# Patient Record
Sex: Female | Born: 1955 | Race: Black or African American | Hispanic: No | Marital: Single | Smoking: Current every day smoker
Health system: Southern US, Community
[De-identification: ages and names within clinical notes are randomized; demographics above are authoritative.]

## PROBLEM LIST (undated history)

## (undated) DIAGNOSIS — R928 Other abnormal and inconclusive findings on diagnostic imaging of breast: Secondary | ICD-10-CM

## (undated) DIAGNOSIS — K219 Gastro-esophageal reflux disease without esophagitis: Secondary | ICD-10-CM

## (undated) DIAGNOSIS — IMO0002 Reserved for concepts with insufficient information to code with codable children: Secondary | ICD-10-CM

## (undated) DIAGNOSIS — I739 Peripheral vascular disease, unspecified: Secondary | ICD-10-CM

## (undated) DIAGNOSIS — E785 Hyperlipidemia, unspecified: Secondary | ICD-10-CM

## (undated) DIAGNOSIS — I2510 Atherosclerotic heart disease of native coronary artery without angina pectoris: Secondary | ICD-10-CM

## (undated) DIAGNOSIS — I10 Essential (primary) hypertension: Secondary | ICD-10-CM

## (undated) DIAGNOSIS — N189 Chronic kidney disease, unspecified: Secondary | ICD-10-CM

## (undated) HISTORY — DX: Reserved for concepts with insufficient information to code with codable children: IMO0002

## (undated) HISTORY — DX: Morbid (severe) obesity due to excess calories: E66.01

## (undated) HISTORY — DX: Hyperlipidemia, unspecified: E78.5

## (undated) HISTORY — DX: Chronic kidney disease, unspecified: N18.9

## (undated) HISTORY — DX: Peripheral vascular disease, unspecified: I73.9

## (undated) HISTORY — PX: TRIGGER FINGER RELEASE: SHX641

## (undated) HISTORY — DX: Atherosclerotic heart disease of native coronary artery without angina pectoris: I25.10

## (undated) HISTORY — DX: Essential (primary) hypertension: I10

## (undated) HISTORY — DX: Gastro-esophageal reflux disease without esophagitis: K21.9

## (undated) HISTORY — DX: Other abnormal and inconclusive findings on diagnostic imaging of breast: R92.8

---

## 1989-03-02 HISTORY — PX: UPPER GASTROINTESTINAL ENDOSCOPY: SHX188

## 1997-12-31 ENCOUNTER — Encounter: Admission: RE | Admit: 1997-12-31 | Discharge: 1997-12-31 | Payer: Self-pay | Admitting: Family Medicine

## 1998-06-17 ENCOUNTER — Encounter: Admission: RE | Admit: 1998-06-17 | Discharge: 1998-06-17 | Payer: Self-pay | Admitting: Sports Medicine

## 1998-06-18 ENCOUNTER — Encounter: Admission: RE | Admit: 1998-06-18 | Discharge: 1998-06-18 | Payer: Self-pay | Admitting: Family Medicine

## 1998-07-01 ENCOUNTER — Encounter: Admission: RE | Admit: 1998-07-01 | Discharge: 1998-07-01 | Payer: Self-pay | Admitting: Family Medicine

## 1998-07-23 ENCOUNTER — Encounter: Admission: RE | Admit: 1998-07-23 | Discharge: 1998-07-23 | Payer: Self-pay | Admitting: Sports Medicine

## 1998-08-21 ENCOUNTER — Other Ambulatory Visit: Admission: RE | Admit: 1998-08-21 | Discharge: 1998-08-21 | Payer: Self-pay | Admitting: *Deleted

## 1998-10-20 ENCOUNTER — Emergency Department (HOSPITAL_COMMUNITY): Admission: EM | Admit: 1998-10-20 | Discharge: 1998-10-20 | Payer: Self-pay | Admitting: Emergency Medicine

## 1998-10-25 ENCOUNTER — Encounter: Admission: RE | Admit: 1998-10-25 | Discharge: 1998-10-25 | Payer: Self-pay | Admitting: Family Medicine

## 1998-11-26 ENCOUNTER — Encounter: Admission: RE | Admit: 1998-11-26 | Discharge: 1998-11-26 | Payer: Self-pay | Admitting: Family Medicine

## 1999-01-08 ENCOUNTER — Encounter: Admission: RE | Admit: 1999-01-08 | Discharge: 1999-01-08 | Payer: Self-pay | Admitting: Family Medicine

## 1999-01-20 ENCOUNTER — Encounter: Admission: RE | Admit: 1999-01-20 | Discharge: 1999-01-20 | Payer: Self-pay | Admitting: Family Medicine

## 1999-03-25 ENCOUNTER — Encounter: Admission: RE | Admit: 1999-03-25 | Discharge: 1999-03-25 | Payer: Self-pay | Admitting: Sports Medicine

## 1999-04-10 ENCOUNTER — Encounter: Admission: RE | Admit: 1999-04-10 | Discharge: 1999-04-10 | Payer: Self-pay | Admitting: Family Medicine

## 1999-04-14 ENCOUNTER — Encounter: Payer: Self-pay | Admitting: Sports Medicine

## 1999-04-14 ENCOUNTER — Encounter: Admission: RE | Admit: 1999-04-14 | Discharge: 1999-04-14 | Payer: Self-pay | Admitting: Family Medicine

## 1999-04-14 ENCOUNTER — Encounter: Admission: RE | Admit: 1999-04-14 | Discharge: 1999-04-14 | Payer: Self-pay | Admitting: Sports Medicine

## 1999-06-10 ENCOUNTER — Other Ambulatory Visit: Admission: RE | Admit: 1999-06-10 | Discharge: 1999-06-10 | Payer: Self-pay | Admitting: Family Medicine

## 1999-06-12 ENCOUNTER — Encounter: Admission: RE | Admit: 1999-06-12 | Discharge: 1999-06-12 | Payer: Self-pay | Admitting: Family Medicine

## 1999-06-18 ENCOUNTER — Encounter: Admission: RE | Admit: 1999-06-18 | Discharge: 1999-06-18 | Payer: Self-pay | Admitting: Family Medicine

## 1999-06-20 ENCOUNTER — Encounter: Admission: RE | Admit: 1999-06-20 | Discharge: 1999-06-20 | Payer: Self-pay | Admitting: Family Medicine

## 1999-10-15 ENCOUNTER — Encounter: Payer: Self-pay | Admitting: Emergency Medicine

## 1999-10-15 ENCOUNTER — Emergency Department (HOSPITAL_COMMUNITY): Admission: EM | Admit: 1999-10-15 | Discharge: 1999-10-15 | Payer: Self-pay | Admitting: Emergency Medicine

## 2001-03-10 ENCOUNTER — Encounter: Admission: RE | Admit: 2001-03-10 | Discharge: 2001-03-10 | Payer: Self-pay | Admitting: Family Medicine

## 2001-03-24 ENCOUNTER — Encounter: Admission: RE | Admit: 2001-03-24 | Discharge: 2001-03-24 | Payer: Self-pay | Admitting: Family Medicine

## 2001-04-04 ENCOUNTER — Emergency Department (HOSPITAL_COMMUNITY): Admission: EM | Admit: 2001-04-04 | Discharge: 2001-04-04 | Payer: Self-pay | Admitting: *Deleted

## 2001-04-04 ENCOUNTER — Encounter: Payer: Self-pay | Admitting: Emergency Medicine

## 2002-12-02 ENCOUNTER — Emergency Department (HOSPITAL_COMMUNITY): Admission: EM | Admit: 2002-12-02 | Discharge: 2002-12-02 | Payer: Self-pay | Admitting: Emergency Medicine

## 2002-12-02 ENCOUNTER — Encounter: Payer: Self-pay | Admitting: Emergency Medicine

## 2003-06-26 ENCOUNTER — Encounter: Admission: RE | Admit: 2003-06-26 | Discharge: 2003-06-26 | Payer: Self-pay | Admitting: Sports Medicine

## 2003-07-05 ENCOUNTER — Encounter (INDEPENDENT_AMBULATORY_CARE_PROVIDER_SITE_OTHER): Payer: Self-pay | Admitting: *Deleted

## 2003-07-05 LAB — CONVERTED CEMR LAB

## 2003-07-11 ENCOUNTER — Encounter: Admission: RE | Admit: 2003-07-11 | Discharge: 2003-07-11 | Payer: Self-pay | Admitting: Family Medicine

## 2003-07-17 ENCOUNTER — Encounter: Admission: RE | Admit: 2003-07-17 | Discharge: 2003-07-17 | Payer: Self-pay | Admitting: Family Medicine

## 2003-07-17 ENCOUNTER — Encounter (INDEPENDENT_AMBULATORY_CARE_PROVIDER_SITE_OTHER): Payer: Self-pay | Admitting: Specialist

## 2003-08-09 ENCOUNTER — Encounter: Admission: RE | Admit: 2003-08-09 | Discharge: 2003-08-09 | Payer: Self-pay | Admitting: Family Medicine

## 2003-09-05 ENCOUNTER — Encounter: Admission: RE | Admit: 2003-09-05 | Discharge: 2003-09-05 | Payer: Self-pay | Admitting: Family Medicine

## 2003-09-07 ENCOUNTER — Encounter: Admission: RE | Admit: 2003-09-07 | Discharge: 2003-09-07 | Payer: Self-pay | Admitting: Family Medicine

## 2003-09-09 ENCOUNTER — Emergency Department (HOSPITAL_COMMUNITY): Admission: EM | Admit: 2003-09-09 | Discharge: 2003-09-09 | Payer: Self-pay | Admitting: Emergency Medicine

## 2004-06-11 ENCOUNTER — Emergency Department (HOSPITAL_COMMUNITY): Admission: EM | Admit: 2004-06-11 | Discharge: 2004-06-11 | Payer: Self-pay | Admitting: Emergency Medicine

## 2005-03-10 ENCOUNTER — Emergency Department (HOSPITAL_COMMUNITY): Admission: EM | Admit: 2005-03-10 | Discharge: 2005-03-10 | Payer: Self-pay | Admitting: Emergency Medicine

## 2006-03-13 ENCOUNTER — Emergency Department (HOSPITAL_COMMUNITY): Admission: EM | Admit: 2006-03-13 | Discharge: 2006-03-13 | Payer: Self-pay | Admitting: Emergency Medicine

## 2006-04-29 DIAGNOSIS — F17200 Nicotine dependence, unspecified, uncomplicated: Secondary | ICD-10-CM

## 2006-04-29 DIAGNOSIS — I10 Essential (primary) hypertension: Secondary | ICD-10-CM | POA: Insufficient documentation

## 2006-04-29 DIAGNOSIS — E669 Obesity, unspecified: Secondary | ICD-10-CM

## 2006-04-30 ENCOUNTER — Encounter (INDEPENDENT_AMBULATORY_CARE_PROVIDER_SITE_OTHER): Payer: Self-pay | Admitting: *Deleted

## 2006-12-24 ENCOUNTER — Emergency Department (HOSPITAL_COMMUNITY): Admission: EM | Admit: 2006-12-24 | Discharge: 2006-12-24 | Payer: Self-pay | Admitting: Emergency Medicine

## 2006-12-27 ENCOUNTER — Emergency Department (HOSPITAL_COMMUNITY): Admission: EM | Admit: 2006-12-27 | Discharge: 2006-12-27 | Payer: Self-pay | Admitting: Emergency Medicine

## 2006-12-27 ENCOUNTER — Ambulatory Visit: Payer: Self-pay | Admitting: *Deleted

## 2007-01-05 ENCOUNTER — Encounter: Payer: Self-pay | Admitting: Family Medicine

## 2007-01-05 ENCOUNTER — Ambulatory Visit: Payer: Self-pay | Admitting: Family Medicine

## 2007-01-05 LAB — CONVERTED CEMR LAB
ALT: 12 units/L (ref 0–35)
AST: 17 units/L (ref 0–37)
Albumin: 4.1 g/dL (ref 3.5–5.2)
Alkaline Phosphatase: 77 units/L (ref 39–117)
BUN: 13 mg/dL (ref 6–23)
CO2: 24 meq/L (ref 19–32)
Calcium: 9.3 mg/dL (ref 8.4–10.5)
Chloride: 107 meq/L (ref 96–112)
Creatinine, Ser: 0.86 mg/dL (ref 0.40–1.20)
Glucose, Bld: 92 mg/dL (ref 70–99)
Potassium: 3.7 meq/L (ref 3.5–5.3)
Sodium: 141 meq/L (ref 135–145)
Total Bilirubin: 0.6 mg/dL (ref 0.3–1.2)
Total Protein: 7.5 g/dL (ref 6.0–8.3)

## 2007-01-12 ENCOUNTER — Encounter: Payer: Self-pay | Admitting: Family Medicine

## 2007-01-12 ENCOUNTER — Ambulatory Visit: Payer: Self-pay | Admitting: Family Medicine

## 2007-01-12 LAB — CONVERTED CEMR LAB
BUN: 21 mg/dL (ref 6–23)
Chloride: 104 meq/L (ref 96–112)
Creatinine, Ser: 0.86 mg/dL (ref 0.40–1.20)
Glucose, Bld: 96 mg/dL (ref 70–99)
Potassium: 4 meq/L (ref 3.5–5.3)
Sodium: 139 meq/L (ref 135–145)

## 2007-02-28 ENCOUNTER — Ambulatory Visit: Payer: Self-pay | Admitting: Family Medicine

## 2007-03-08 ENCOUNTER — Telehealth: Payer: Self-pay | Admitting: *Deleted

## 2007-04-08 ENCOUNTER — Ambulatory Visit: Payer: Self-pay | Admitting: Family Medicine

## 2007-04-26 ENCOUNTER — Ambulatory Visit: Payer: Self-pay | Admitting: Family Medicine

## 2007-05-06 ENCOUNTER — Ambulatory Visit: Payer: Self-pay | Admitting: Family Medicine

## 2007-05-17 ENCOUNTER — Ambulatory Visit: Payer: Self-pay | Admitting: Family Medicine

## 2007-05-18 ENCOUNTER — Telehealth: Payer: Self-pay | Admitting: *Deleted

## 2007-06-02 ENCOUNTER — Ambulatory Visit: Payer: Self-pay | Admitting: Family Medicine

## 2007-06-07 ENCOUNTER — Encounter (INDEPENDENT_AMBULATORY_CARE_PROVIDER_SITE_OTHER): Payer: Self-pay | Admitting: *Deleted

## 2007-06-07 ENCOUNTER — Ambulatory Visit: Payer: Self-pay | Admitting: Family Medicine

## 2007-06-07 ENCOUNTER — Inpatient Hospital Stay (HOSPITAL_COMMUNITY): Admission: EM | Admit: 2007-06-07 | Discharge: 2007-06-09 | Payer: Self-pay | Admitting: Emergency Medicine

## 2007-06-07 DIAGNOSIS — D649 Anemia, unspecified: Secondary | ICD-10-CM | POA: Insufficient documentation

## 2007-06-20 ENCOUNTER — Encounter: Payer: Self-pay | Admitting: Family Medicine

## 2007-06-20 ENCOUNTER — Ambulatory Visit: Payer: Self-pay | Admitting: Family Medicine

## 2007-06-20 LAB — CONVERTED CEMR LAB
BUN: 24 mg/dL — ABNORMAL HIGH (ref 6–23)
CO2: 21 meq/L (ref 19–32)
Calcium: 9 mg/dL (ref 8.4–10.5)
Chloride: 106 meq/L (ref 96–112)
Creatinine, Ser: 1.31 mg/dL — ABNORMAL HIGH (ref 0.40–1.20)
Glucose, Bld: 95 mg/dL (ref 70–99)
HCT: 28.5 % — ABNORMAL LOW (ref 36.0–46.0)
Hemoglobin: 9.3 g/dL — ABNORMAL LOW (ref 12.0–15.0)
MCHC: 32.6 g/dL (ref 30.0–36.0)
MCV: 84.6 fL (ref 78.0–100.0)
Platelets: 219 10*3/uL (ref 150–400)
Potassium: 4.3 meq/L (ref 3.5–5.3)
RBC: 3.37 M/uL — ABNORMAL LOW (ref 3.87–5.11)
RDW: 13.4 % (ref 11.5–15.5)
Sodium: 138 meq/L (ref 135–145)
WBC: 6.2 10*3/uL (ref 4.0–10.5)

## 2007-06-21 ENCOUNTER — Telehealth: Payer: Self-pay | Admitting: Family Medicine

## 2007-07-13 ENCOUNTER — Ambulatory Visit: Payer: Self-pay | Admitting: Family Medicine

## 2007-07-13 ENCOUNTER — Encounter: Payer: Self-pay | Admitting: Family Medicine

## 2007-07-13 LAB — CONVERTED CEMR LAB
BUN: 14 mg/dL (ref 6–23)
CO2: 22 meq/L (ref 19–32)
Calcium: 9.4 mg/dL (ref 8.4–10.5)
Chloride: 107 meq/L (ref 96–112)
Creatinine, Ser: 1.09 mg/dL (ref 0.40–1.20)
Glucose, Bld: 111 mg/dL — ABNORMAL HIGH (ref 70–99)
Potassium: 3.9 meq/L (ref 3.5–5.3)
Sodium: 141 meq/L (ref 135–145)

## 2007-07-20 ENCOUNTER — Telehealth: Payer: Self-pay | Admitting: Family Medicine

## 2007-08-10 ENCOUNTER — Ambulatory Visit: Payer: Self-pay | Admitting: Family Medicine

## 2007-08-10 ENCOUNTER — Encounter: Payer: Self-pay | Admitting: Family Medicine

## 2007-08-10 LAB — CONVERTED CEMR LAB
BUN: 21 mg/dL (ref 6–23)
CO2: 18 meq/L — ABNORMAL LOW (ref 19–32)
Calcium: 9.5 mg/dL (ref 8.4–10.5)
Chloride: 106 meq/L (ref 96–112)
Creatinine, Ser: 1.13 mg/dL (ref 0.40–1.20)
Glucose, Bld: 100 mg/dL — ABNORMAL HIGH (ref 70–99)
Potassium: 4.3 meq/L (ref 3.5–5.3)
Sodium: 139 meq/L (ref 135–145)

## 2007-08-15 ENCOUNTER — Telehealth: Payer: Self-pay | Admitting: *Deleted

## 2007-09-19 ENCOUNTER — Ambulatory Visit: Payer: Self-pay | Admitting: Family Medicine

## 2007-09-20 ENCOUNTER — Telehealth: Payer: Self-pay | Admitting: *Deleted

## 2007-10-07 ENCOUNTER — Encounter (INDEPENDENT_AMBULATORY_CARE_PROVIDER_SITE_OTHER): Payer: Self-pay | Admitting: *Deleted

## 2007-10-14 ENCOUNTER — Ambulatory Visit: Payer: Self-pay | Admitting: Family Medicine

## 2007-10-26 ENCOUNTER — Telehealth (INDEPENDENT_AMBULATORY_CARE_PROVIDER_SITE_OTHER): Payer: Self-pay | Admitting: *Deleted

## 2007-11-03 ENCOUNTER — Telehealth (INDEPENDENT_AMBULATORY_CARE_PROVIDER_SITE_OTHER): Payer: Self-pay | Admitting: *Deleted

## 2007-11-04 ENCOUNTER — Encounter: Payer: Self-pay | Admitting: Family Medicine

## 2007-11-08 ENCOUNTER — Ambulatory Visit: Payer: Self-pay | Admitting: Family Medicine

## 2007-12-22 ENCOUNTER — Ambulatory Visit: Payer: Self-pay | Admitting: Family Medicine

## 2008-01-02 ENCOUNTER — Ambulatory Visit: Payer: Self-pay | Admitting: Family Medicine

## 2008-01-02 DIAGNOSIS — Z872 Personal history of diseases of the skin and subcutaneous tissue: Secondary | ICD-10-CM | POA: Insufficient documentation

## 2008-03-26 ENCOUNTER — Ambulatory Visit (HOSPITAL_COMMUNITY): Admission: RE | Admit: 2008-03-26 | Discharge: 2008-03-26 | Payer: Self-pay | Admitting: Family Medicine

## 2008-03-26 ENCOUNTER — Encounter (INDEPENDENT_AMBULATORY_CARE_PROVIDER_SITE_OTHER): Payer: Self-pay | Admitting: Family Medicine

## 2008-03-26 ENCOUNTER — Ambulatory Visit: Payer: Self-pay | Admitting: Family Medicine

## 2008-04-27 ENCOUNTER — Telehealth: Payer: Self-pay | Admitting: Family Medicine

## 2008-05-07 ENCOUNTER — Ambulatory Visit: Payer: Self-pay | Admitting: Family Medicine

## 2008-05-07 ENCOUNTER — Encounter: Payer: Self-pay | Admitting: Family Medicine

## 2008-05-07 LAB — CONVERTED CEMR LAB
BUN: 17 mg/dL (ref 6–23)
CO2: 19 meq/L (ref 19–32)
Calcium: 8.6 mg/dL (ref 8.4–10.5)
Chloride: 108 meq/L (ref 96–112)
Creatinine, Ser: 0.93 mg/dL (ref 0.40–1.20)
Glucose, Bld: 130 mg/dL — ABNORMAL HIGH (ref 70–99)
Potassium: 3.8 meq/L (ref 3.5–5.3)
Sodium: 141 meq/L (ref 135–145)

## 2008-05-17 ENCOUNTER — Encounter: Payer: Self-pay | Admitting: Family Medicine

## 2008-06-04 ENCOUNTER — Ambulatory Visit: Payer: Self-pay | Admitting: Family Medicine

## 2008-08-28 ENCOUNTER — Ambulatory Visit: Payer: Self-pay | Admitting: Family Medicine

## 2008-11-01 ENCOUNTER — Encounter: Payer: Self-pay | Admitting: Family Medicine

## 2008-11-01 ENCOUNTER — Ambulatory Visit: Payer: Self-pay | Admitting: Family Medicine

## 2008-11-02 ENCOUNTER — Encounter: Payer: Self-pay | Admitting: Family Medicine

## 2008-11-02 ENCOUNTER — Ambulatory Visit: Payer: Self-pay | Admitting: Family Medicine

## 2008-11-02 LAB — CONVERTED CEMR LAB
ALT: 9 units/L (ref 0–35)
AST: 14 units/L (ref 0–37)
Albumin: 4.3 g/dL (ref 3.5–5.2)
Alkaline Phosphatase: 86 units/L (ref 39–117)
BUN: 18 mg/dL (ref 6–23)
CO2: 21 meq/L (ref 19–32)
Calcium: 9.4 mg/dL (ref 8.4–10.5)
Chloride: 106 meq/L (ref 96–112)
Cholesterol: 208 mg/dL — ABNORMAL HIGH (ref 0–200)
Creatinine, Ser: 0.99 mg/dL (ref 0.40–1.20)
Glucose, Bld: 84 mg/dL (ref 70–99)
HDL: 55 mg/dL (ref >39)
LDL Cholesterol: 135 mg/dL — ABNORMAL HIGH (ref 0–99)
Potassium: 4.3 meq/L (ref 3.5–5.3)
Sodium: 141 meq/L (ref 135–145)
Total Bilirubin: 0.5 mg/dL (ref 0.3–1.2)
Total CHOL/HDL Ratio: 3.8
Total Protein: 7.8 g/dL (ref 6.0–8.3)
Triglycerides: 89 mg/dL (ref <150)
VLDL: 18 mg/dL (ref 0–40)

## 2008-11-08 ENCOUNTER — Encounter: Payer: Self-pay | Admitting: Family Medicine

## 2008-11-26 ENCOUNTER — Telehealth: Payer: Self-pay | Admitting: *Deleted

## 2008-12-10 ENCOUNTER — Encounter: Payer: Self-pay | Admitting: Family Medicine

## 2009-02-05 ENCOUNTER — Ambulatory Visit: Payer: Self-pay | Admitting: Family Medicine

## 2009-02-07 ENCOUNTER — Ambulatory Visit: Payer: Self-pay | Admitting: Family Medicine

## 2009-02-11 ENCOUNTER — Telehealth: Payer: Self-pay | Admitting: *Deleted

## 2009-04-01 ENCOUNTER — Ambulatory Visit: Payer: Self-pay | Admitting: Family Medicine

## 2009-04-03 ENCOUNTER — Telehealth: Payer: Self-pay | Admitting: Family Medicine

## 2009-06-03 ENCOUNTER — Encounter: Payer: Self-pay | Admitting: Family Medicine

## 2009-06-20 ENCOUNTER — Ambulatory Visit: Payer: Self-pay | Admitting: Family Medicine

## 2009-08-06 ENCOUNTER — Ambulatory Visit: Payer: Self-pay | Admitting: Family Medicine

## 2009-08-20 ENCOUNTER — Ambulatory Visit: Payer: Self-pay | Admitting: Family Medicine

## 2009-08-20 DIAGNOSIS — M6740 Ganglion, unspecified site: Secondary | ICD-10-CM | POA: Insufficient documentation

## 2009-08-21 ENCOUNTER — Ambulatory Visit: Payer: Self-pay | Admitting: Sports Medicine

## 2009-08-21 DIAGNOSIS — R229 Localized swelling, mass and lump, unspecified: Secondary | ICD-10-CM

## 2009-09-03 ENCOUNTER — Encounter: Payer: Self-pay | Admitting: Family Medicine

## 2009-12-02 ENCOUNTER — Encounter: Payer: Self-pay | Admitting: Family Medicine

## 2009-12-02 ENCOUNTER — Ambulatory Visit: Payer: Self-pay | Admitting: Family Medicine

## 2009-12-02 LAB — CONVERTED CEMR LAB
BUN: 15 mg/dL (ref 6–23)
CO2: 22 meq/L (ref 19–32)
Calcium: 9.2 mg/dL (ref 8.4–10.5)
Chloride: 109 meq/L (ref 96–112)
Cholesterol: 236 mg/dL — ABNORMAL HIGH (ref 0–200)
Creatinine, Ser: 0.95 mg/dL (ref 0.40–1.20)
Glucose, Bld: 106 mg/dL — ABNORMAL HIGH (ref 70–99)
HDL: 54 mg/dL (ref >39)
LDL Cholesterol: 159 mg/dL — ABNORMAL HIGH (ref 0–99)
Potassium: 4.3 meq/L (ref 3.5–5.3)
Sodium: 138 meq/L (ref 135–145)
Total CHOL/HDL Ratio: 4.4
Triglycerides: 113 mg/dL (ref <150)
VLDL: 23 mg/dL (ref 0–40)

## 2009-12-03 ENCOUNTER — Telehealth: Payer: Self-pay | Admitting: *Deleted

## 2009-12-16 ENCOUNTER — Ambulatory Visit (HOSPITAL_COMMUNITY): Admission: RE | Admit: 2009-12-16 | Discharge: 2009-12-16 | Payer: Self-pay | Admitting: Family Medicine

## 2009-12-23 ENCOUNTER — Ambulatory Visit: Payer: Self-pay | Admitting: Family Medicine

## 2009-12-23 DIAGNOSIS — E785 Hyperlipidemia, unspecified: Secondary | ICD-10-CM

## 2009-12-24 ENCOUNTER — Encounter: Admission: RE | Admit: 2009-12-24 | Discharge: 2009-12-24 | Payer: Self-pay | Admitting: Family Medicine

## 2009-12-24 DIAGNOSIS — R928 Other abnormal and inconclusive findings on diagnostic imaging of breast: Secondary | ICD-10-CM

## 2009-12-24 HISTORY — DX: Other abnormal and inconclusive findings on diagnostic imaging of breast: R92.8

## 2009-12-26 ENCOUNTER — Observation Stay (HOSPITAL_COMMUNITY)
Admission: EM | Admit: 2009-12-26 | Discharge: 2009-12-27 | Payer: Self-pay | Source: Home / Self Care | Admitting: Emergency Medicine

## 2009-12-27 ENCOUNTER — Ambulatory Visit: Payer: Self-pay | Admitting: Family Medicine

## 2009-12-27 ENCOUNTER — Telehealth: Payer: Self-pay | Admitting: Family Medicine

## 2009-12-27 ENCOUNTER — Encounter: Payer: Self-pay | Admitting: Family Medicine

## 2009-12-27 LAB — CONVERTED CEMR LAB
BUN: 19 mg/dL
CO2: 20 meq/L
Chloride: 107 meq/L
Cholesterol: 225 mg/dL
Creatinine, Ser: 1.05 mg/dL
Glucose, Bld: 169 mg/dL
HDL: 47 mg/dL
Hgb A1c MFr Bld: 5.2 %
LDL Cholesterol: 164 mg/dL
Potassium: 3.7 meq/L
Sodium: 138 meq/L
Triglycerides: 70 mg/dL

## 2009-12-31 ENCOUNTER — Ambulatory Visit: Payer: Self-pay | Admitting: Family Medicine

## 2010-01-04 ENCOUNTER — Telehealth: Payer: Self-pay | Admitting: Family Medicine

## 2010-01-22 ENCOUNTER — Ambulatory Visit: Payer: Self-pay | Admitting: Family Medicine

## 2010-01-31 ENCOUNTER — Ambulatory Visit: Payer: Self-pay | Admitting: Family Medicine

## 2010-01-31 DIAGNOSIS — N644 Mastodynia: Secondary | ICD-10-CM | POA: Insufficient documentation

## 2010-02-03 ENCOUNTER — Ambulatory Visit: Payer: Self-pay | Admitting: Family Medicine

## 2010-02-03 DIAGNOSIS — H5710 Ocular pain, unspecified eye: Secondary | ICD-10-CM

## 2010-02-05 ENCOUNTER — Ambulatory Visit: Payer: Self-pay | Admitting: Family Medicine

## 2010-02-11 ENCOUNTER — Ambulatory Visit: Payer: Self-pay | Admitting: Family Medicine

## 2010-02-19 ENCOUNTER — Ambulatory Visit: Payer: Self-pay | Admitting: Family Medicine

## 2010-02-19 ENCOUNTER — Encounter: Payer: Self-pay | Admitting: Family Medicine

## 2010-02-19 DIAGNOSIS — L089 Local infection of the skin and subcutaneous tissue, unspecified: Secondary | ICD-10-CM | POA: Insufficient documentation

## 2010-02-19 DIAGNOSIS — L680 Hirsutism: Secondary | ICD-10-CM | POA: Insufficient documentation

## 2010-02-19 LAB — CONVERTED CEMR LAB
HCT: 34.8 % — ABNORMAL LOW (ref 36.0–46.0)
Hemoglobin: 11.2 g/dL — ABNORMAL LOW (ref 12.0–15.0)
MCHC: 32.2 g/dL (ref 30.0–36.0)
MCV: 84.3 fL (ref 78.0–100.0)
Platelets: 229 10*3/uL (ref 150–400)
RBC: 4.13 M/uL (ref 3.87–5.11)
RDW: 13.8 % (ref 11.5–15.5)
TSH: 2.67 microintl units/mL (ref 0.350–4.500)
Testosterone: 48.64 ng/dL (ref 10–70)
WBC: 5.3 10*3/uL (ref 4.0–10.5)

## 2010-03-21 ENCOUNTER — Ambulatory Visit: Admission: RE | Admit: 2010-03-21 | Discharge: 2010-03-21 | Payer: Self-pay | Source: Home / Self Care

## 2010-03-21 ENCOUNTER — Encounter: Payer: Self-pay | Admitting: Family Medicine

## 2010-03-22 LAB — CONVERTED CEMR LAB
BUN: 18 mg/dL (ref 6–23)
CO2: 26 meq/L (ref 19–32)
Calcium: 9.6 mg/dL (ref 8.4–10.5)
Chloride: 106 meq/L (ref 96–112)
Creatinine, Ser: 1.09 mg/dL (ref 0.40–1.20)
Glucose, Bld: 109 mg/dL — ABNORMAL HIGH (ref 70–99)
Potassium: 4.7 meq/L (ref 3.5–5.3)
Sodium: 140 meq/L (ref 135–145)

## 2010-03-23 ENCOUNTER — Encounter: Payer: Self-pay | Admitting: Sports Medicine

## 2010-03-24 ENCOUNTER — Encounter: Payer: Self-pay | Admitting: Family Medicine

## 2010-03-25 ENCOUNTER — Ambulatory Visit: Admit: 2010-03-25 | Payer: Self-pay

## 2010-03-25 ENCOUNTER — Ambulatory Visit
Admission: RE | Admit: 2010-03-25 | Discharge: 2010-03-25 | Payer: Self-pay | Source: Home / Self Care | Attending: Family Medicine | Admitting: Family Medicine

## 2010-03-27 ENCOUNTER — Ambulatory Visit: Admit: 2010-03-27 | Payer: Self-pay

## 2010-04-01 ENCOUNTER — Ambulatory Visit
Admission: RE | Admit: 2010-04-01 | Discharge: 2010-04-01 | Payer: Self-pay | Source: Home / Self Care | Attending: Sports Medicine | Admitting: Sports Medicine

## 2010-04-01 DIAGNOSIS — M6530 Trigger finger, unspecified finger: Secondary | ICD-10-CM | POA: Insufficient documentation

## 2010-04-01 NOTE — Progress Notes (Signed)
Summary: triage  Phone Note Call from Patient Call back at [phone]   Caller: Patient Summary of Call: Pt was seen Monday calling today becuase she is not feeling any better. Initial call taken by: Clydell Hakim,  April 03, 2009 9:54 AM  Follow-up for Phone Call        she is still staying in bed all day. told her the viral illnesses will take more than 2 days to resolve. urged her to get up & move around every hour. more activity should help. call back if fever, difficulty breathing, pain. told her md notes indicated that she should feel better by the weekend. she is going to give it another day or 2. if no improvement, will call for appt Follow-up by: Golden Circle RN,  April 03, 2009 10:02 AM

## 2010-04-01 NOTE — Assessment & Plan Note (Signed)
Summary: hospital f/u for CP, HTN, HLD   Vital Signs:  Patient profile:   55 year old female Weight:      316.8 pounds Temp:     98.9 degrees F oral Pulse rate:   65 / minute Pulse rhythm:   regular BP sitting:   167 / 125  (left arm) Cuff size:   large  Vitals Entered By: Loralee Pacas CMA (December 31, 2009 3:12 PM) CC: hospital follow up Comments pt stated that she has not been taking meds bc she cannot afford them anymore. no longer on the $4 drug list   Primary Care Provider:  Demetria Pore MD  CC:  hospital follow up.  History of Present Illness: Pt just got out of the hostpial on Friday. She has not had any medicine since Saturday because she didn't have the money to buy it. She wants to work today on getting her meds sent into [facility] where they have a 4 dollar plan. We switched her meds so that she will be able to afford them. PT still has occasional chest pains but they are not as bad as her CP when she went into the hospital.    hypertension: Pt has an elevatedc BP today. She has not gotten her meds so she is not on BP meds at this time. Encouraged her to pick them up today as we have just switched them to a different pharmacy so they are more affordable.   HLD: Pt had a FLP in the hospital. Her Chol was 225, TG 70, HDL 47 and LDL 164. Pt has a new BP med to start but will likely need to get a statin from her PCP at her next visit.    A1c was 5.68m in the hospital  Habits & Providers  Alcohol-Tobacco-Diet     Alcohol drinks/day: 0     Tobacco Status: current     Tobacco Counseling: to quit use of tobacco products     Cigarette Packs/Day: 0.5  Current Medications (verified): 1)  Metoprolol Tartrate 100 Mg Tabs (Metoprolol Tartrate) .Marland Kitchen.. 1 Tab By Mouth Two Times A Day For High Blood Pressure 2)  Hydrochlorothiazide 25 Mg Tabs (Hydrochlorothiazide) .... Take 1 Pill Dialy in The Morning For High Blood Pressure  Allergies (verified): 1)  ! Ace  Inhibitors  Review of Systems        vitals reviewed and pertinent negatives and positives seen in HPI   Physical Exam  General:  Well-developed,well-nourished,in no acute distress; alert,appropriate and cooperative throughout examination Lungs:  Normal respiratory effort, chest expands symmetrically. Lungs are clear to auscultation, no crackles or wheezes. Heart:  Normal rate and regular rhythm. S1 and S2 normal without gallop, murmur, click, rub or other extra sounds.   Impression & Recommendations:  Problem # 1:  HYPERTENSION, BENIGN SYSTEMIC (ICD-401.1) Assessment Deteriorated PT has elevated BP today but she has not picked up her meds. Meds were changed to a pharmacy with 4 dollar meds for more affordability. She is to pick them up today.   Her updated medication list for this problem includes:    Metoprolol Tartrate 100 Mg Tabs (Metoprolol tartrate) .Marland Kitchen... 1 tab by mouth two times a day for high blood pressure    Hydrochlorothiazide 25 Mg Tabs (Hydrochlorothiazide) .Marland Kitchen... Take 1 pill dialy in the morning for high blood pressure  Orders: [facility]- Est Level  3 ([address])  Problem # 2:  HYPERLIPIDEMIA, MILD (ICD-272.4) Assessment: Deteriorated Pt had FLP while in the hospital. Her  total Chol is 225 and her LDL is 164. She will need to start at statin at the next appointment unless she makes some big changes in her health. She is suppose to meet with Dr. Gerilyn Pilgrim and then again with her PCP. At that time she will likely need to start on Simvastatin but I will leave this up to her PCP.   Orders: [facility]- Est Level  3 ([address])  Complete Medication List: 1)  Metoprolol Tartrate 100 Mg Tabs (Metoprolol tartrate) .Marland Kitchen.. 1 tab by mouth two times a day for high blood pressure 2)  Hydrochlorothiazide 25 Mg Tabs (Hydrochlorothiazide) .... Take 1 pill dialy in the morning for high blood pressure  Patient Instructions: 1)  Keep working on the diet. Next time you see Dr. Fara Boros you may need to start a  statin (cholesterol med). 2)  Make an appointment to see Dr. Wyona Almas (nutrionist) to talk about foods that keep your potassium up and about more weight loss.  3)  Pick up the metoprolol if you can only get 1.  4)  Your BP today is 167/125, your goal BP is 120/80. 5)  Keep your appointment with Dr. Fara Boros. 6)  Come in on Friday to get your blood pressure checked by the nurse.  Prescriptions: HYDROCHLOROTHIAZIDE 25 MG TABS (HYDROCHLOROTHIAZIDE) take 1 pill dialy in the morning for high blood pressure  #30 x 4   Entered and Authorized by:   Jamie Brookes MD   Signed by:   Jamie Brookes MD on 12/31/2009   Method used:   Electronically to        [facility] (retail)       [address]       [place], [place]  [address]       Ph: [phone]       Fax: [phone]   RxID:   [id number] METOPROLOL TARTRATE 100 MG TABS (METOPROLOL TARTRATE) 1 tab by mouth two times a day for HIGH BLOOD PRESSURE  #60 x 4   Entered and Authorized by:   Jamie Brookes MD   Signed by:   Jamie Brookes MD on 12/31/2009   Method used:   Electronically to        [facility] (retail)       [address]       [place], [place]  [address]       Ph: [phone]       Fax: [phone]   RxID:   [id number]    Orders Added: 1)  [facility]- Est Level  3 [[address]]

## 2010-04-01 NOTE — Assessment & Plan Note (Signed)
Summary: Hospital Admission     dict # [phone]   Vital Signs:  Patient profile:   55 year old female O2 Sat:      100 % on Room air Temp:     98.1 degrees F Pulse rate:   54 / minute Resp:     14 per minute BP supine:   148 / 68  O2 Flow:  Room air  Primary Care [facility] Hunger:  Demetria Pore MD  CC:  Chest Pain and SOB.  History of Present Illness: 55 y/o F who comes in with 2 days of intermittent left sided CP without radiation and SOB with exertion. She says that she went to her PCP on Monday and was taken off her amlodipine and continued on Metoprolol and started on Chlorthalidone (she hasn't picked up this Rx yet). On Tuesday she started developing chest tightness. By Thursday the chest tightness felt like "someone standing on my chest" and she had a headache and SOB when walking up the stairs. Her CP was also worst when walking up the stairs. She says that she is not SOB unless she is being active. Her pain was worst last night around 10 pm. No diaphorsesis, weakness, or N/V with the pain. No sick contacts and no prior episodes. No fam h/o cardiac disease. HA present for the last 2 weeks.     Allergies: 1)  ! Ace Inhibitors  Past History:  Family History: Last updated: 12/27/2009 Dad: Diabetes 1st degree, hypertension maternal aunts with breast and ovarian ca No known CAD or CVA  No history of dialysis Mother died with bladder cancer.  Brother and sister have HTn  Social History: Last updated: 12/27/2009 Smokes <1/2 ppd. Denies recreational drugs and EtOH. Livew with her daughter and 3 grandchildren. Not currently working    Past Medical History: h/o PUD `91 - [facility] - 03/02/1989  hypertension abnormal mammogram  Past Surgical History: c-section UGI Series - 03/02/1989  Family History: Dad: Diabetes 1st degree, hypertension maternal aunts with breast and ovarian ca No known CAD or CVA  No history of dialysis Mother died with bladder cancer.  Brother and  sister have HTn  Social History: Smokes <1/2 ppd. Denies recreational drugs and EtOH. Livew with her daughter and 3 grandchildren. Not currently working    Review of Systems       + CP, + SOB, + weakness, but neg for fever chills or other infectious causes.   Physical Exam  General:  Well-developed,well-nourished,in no acute distress; alert,appropriate and cooperative throughout examination, morbidly obese Head:  Normocephalic and atraumatic without obvious abnormalities. No apparent alopecia or balding.  Eyes:  No corneal or conjunctival inflammation noted. EOMI. Perrla.  Vision grossly normal. Ears:  External ear exam shows no significant lesions or deformities.  Otoscopic examination reveals clear canals, tympanic membranes are intact bilaterally without bulging, retraction, inflammation or discharge. Hearing is grossly normal bilaterally. Nose:  External nasal examination shows no deformity or inflammation. Nasal mucosa are pink and moist without lesions or exudates. Mouth:  Oral mucosa and oropharynx without lesions or exudates.  Teeth in good repair. hirsutism noted on upper lip Neck:  No deformities, masses, or tenderness noted. no carotid bruits Lungs:  Normal respiratory effort, chest expands symmetrically. Bilaterally lower lung fields have end-expiratory crackles Heart:  Normal rate and regular rhythm. S1 and S2 normal without gallop, murmur, click, rub or other extra sounds. Abdomen:  Bowel sounds positive,abdomen soft and non-tender without masses, organomegaly or hernias noted. obese Msk:  No deformity or scoliosis noted of thoracic or lumbar spine.   Pulses:  R and L radial,dorsalis pedis and posterior tibial pulses are full and equal bilaterally Extremities:  No clubbing, cyanosis, edema, or deformity noted with normal full range of motion of all joints.   Neurologic:  No cranial nerve deficits noted. DTRs are symmetrical throughout. Sensory, motor and coordinative functions  appear intact. Skin:  Intact without suspicious lesions or rashes Cervical Nodes:  No lymphadenopathy noted Psych:  Cognition and judgment appear intact. Alert and cooperative with normal attention span and concentration. No apparent delusions, illusions, hallucinations   Labs/Studies:   12/02/09: Total Chol 236, TG 113, HDL 54, LDL 159 136/3.2/108/23/18/0.94<114  Ca 8.8 4.8>11.7/34.7<197 POC CE x1 neg CXR: neg EKG: flattened T waves  A/P: 55 y/o F with h/o smoking, HLD, hypertension who comes in with CP and some shortness of breath.  1: CP: Pt says that it feels like "someone is standing on my chest". She does have some risk factors including hypertension, HLD and smoking. Her chest pain improved with nitro but this is clouded by the fact that she was getting breathing treatments as well. Will plan to r/o ACS with CE x3 q8hr, repeat EKG in am, BMET, CBC, FLP, A1c in the am as well. Must consider that this may be COPD changes. She did get prednisone in the ED for possible exacerbation. Will keep this in mind.   2: hypertension: Plan to cont on home dose of Metorprolol and start the chlorthalidone.  3: Diet: low fat diet 4: Prophy: Heparin for DVT prophy.  5: Dispo: Anticipate d/c on Saturday am.      Complete Medication List: 1)  Metoprolol Tartrate 100 Mg Tabs (Metoprolol tartrate) .Marland Kitchen.. 1 tab by mouth two times a day for high blood pressure 2)  Chlorthalidone 25 Mg Tabs (Chlorthalidone) .Marland Kitchen.. 1 tab by mouth daily

## 2010-04-01 NOTE — Assessment & Plan Note (Signed)
Summary: f/u,df   Vital Signs:  Patient profile:   55 year old female Height:      67 inches Weight:      317 pounds BMI:     49.83 Pulse rate:   72 / minute BP sitting:   148 / 98  (left arm) Cuff size:   large  Vitals Entered By: Arlyss Repress CMA, (August 20, 2009 8:42 AM) CC: check knot in left hand. referral to eye doctor. f/up HTN and go over meds. Is Patient Diabetic? No Pain Assessment Patient in pain? no        Primary Care Provider:  Ardeen Garland  MD  CC:  check knot in left hand. referral to eye doctor. f/up HTN and go over meds..  History of Present Illness: Tammy [name] comes in today to discuss HTN, vision, and a nodule in her left hand.  (Note to next provider - be sure to see patient summary documented earlier this month in [facility]). 1) HTN - has had difficulty getting under control.  Has had intolerances in the past (see patient summary).  Has bee tolerating toprol two times a day but seems to take irregularly.  States she has been taking as directed everyday but hasn't taken it yet this mroning.  States she usually takes it around 9 or 10 int he morning and then around 5 or 6 in the evening.  Does this because it says she needs to take with food and she doesn't eat breakfast.  2) Vision - vision blurrier with difficulty reading and increase in watering.  Hasn't had eyes checked "in ten or twleve years".  Not diabetic.  Would like referral to eye doctor.  A friend told her Dr. Dione Booze acceptst he [facility] card.  3) Nodule - painful lump in palm of left hand for at least a week - that's when she first noticed it.  Painful.  Bothers her msot when she drives and it is making it hard to drive.   Habits & Providers  Alcohol-Tobacco-Diet     Tobacco Status: current     Tobacco Counseling: to quit use of tobacco products     Cigarette Packs/Day: 0.5  Allergies: 1)  ! Ace Inhibitors  Social History: Packs/Day:  0.5  Physical Exam  General:  VS Reviewed.  Obese, non ill appearing, NAD.  Eyes:  vision grossly intact, pupils equal, pupils round, pupils reactive to light, corneas and lenses clear, no injection, no optic disk abnormalities, and no retinal abnormalitiies.   Lungs:  Normal respiratory effort, chest expands symmetrically. Lungs are clear to auscultation, no crackles or wheezes. Heart:  Normal rate and regular rhythm. S1 and S2 normal without gallop, murmur, click, rub or other extra sounds. Extremities:  small, approx .5 cm nodule appreciated on palm of left hand, near 4th MCP joint. TTP   Impression & Recommendations:  Problem # 1:  HYPERTENSION, BENIGN SYSTEMIC (ICD-401.1) Assessment Unchanged  This BP today is actually relatively good for her and probably signifies she is taking it regularly and has only missed this AMs med.  However, discussed she needs to return in 1 month to meet her new doctor and recheck this as she may either need to increase the dose or add a second agent.  If a second agent is desired, note she had previous intolerance to ACe/HCTZ and she won't take them again.  Would recommend trying norvasc.  Her updated medication list for this problem includes:    Metoprolol Tartrate 100  Mg Tabs (Metoprolol tartrate) .Marland Kitchen... 1 tab by mouth two times a day for high blood pressure  Orders: [facility]- Est  Level 4 ([address])  Problem # 2:  GANGLION CYST (ICD-727.43) Assessment: New  Will refer to [facility] for possible aspiration or injection, possibly with u/s guidance as somewhat deeper than usual, due to thick palmar skin.   Orders: [facility]- Est  Level 4 ([address])  Problem # 3:  BLURRED VISION (ICD-368.8) Assessment: New  Has not had diabetes on past lab checks.  No other symptoms currently. Due for recheck of chem in September.  Will refer to ophtho for vision eval.   Orders: Ophthalmology Referral (Ophthalmology) [facility]- Est  Level 4 ([address])  Problem # 4:  Preventive Health Care (ICD-V70.0) Open to undergoing screenign  colonoscopy.  Since she is [place], will need referral and will likely have to be put on waaiting list until volunteer doc availabe.   Complete Medication List: 1)  Metoprolol Tartrate 100 Mg Tabs (Metoprolol tartrate) .Marland Kitchen.. 1 tab by mouth two times a day for high blood pressure  Other Orders: Gastroenterology Referral (GI)  Patient Instructions: 1)  It has been great taking care of you.  You will like your new doctor, Dr. Fara Boros.  Please come back in July to meet her and so she can recheck your blood pressure.  Be sure to take your medicine regularly and take it that day before you come! 2)  You can schedule an appointment with our sports medicine center here today on your way out or you can call [phone] to make an appointment.  3)  We will let you know when you have an eye appointment. 4)  We will get you on the waiting list for a colonoscopy.  Prescriptions: METOPROLOL TARTRATE 100 MG TABS (METOPROLOL TARTRATE) 1 tab by mouth two times a day for HIGH BLOOD PRESSURE  #60 x 5   Entered and Authorized by:   Ardeen Garland  MD   Signed by:   Ardeen Garland  MD on 08/20/2009   Method used:   Print then Give to Patient   RxID:   [id number]

## 2010-04-01 NOTE — Assessment & Plan Note (Signed)
Summary: R acute conjunctivitis   Vital Signs:  Patient profile:   55 year old female Weight:      324.5 pounds Temp:     97.8 degrees F oral Pulse rate:   64 / minute Pulse rhythm:   regular BP sitting:   162 / 124  (left arm) Cuff size:   large  Vitals Entered By: Loralee Pacas CMA (June 20, 2009 1:44 PM) CC: ? pink eye right  Comments started itching yesterday thinking that it may be allergies but unsure   Primary Care Provider:  Ardeen Garland  MD  CC:  ? pink eye right .  History of Present Illness: 55yo F w/ acute conjunctivitis  Conjunctivitis: Right eye x 24 hours.  Reports tearing, itching, crusted discharge.  No pain or vision changes.  No associated fevers or chills.  No hx of allergies.  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    Current Medications (verified): 1)  Metoprolol Tartrate 100 Mg Tabs (Metoprolol Tartrate) .Marland Kitchen.. 1 Tab By Mouth Two Times A Day For High Blood Pressure 2)  Ibuprofen 600 Mg Tabs (Ibuprofen) .Marland Kitchen.. 1 Tab By Mouth Q 6 Hrs As Needed Body Aches 3)  Erythromycin 5 Mg/gm Oint (Erythromycin) .... Apply 1cm Ribbon To Lower Eyelid Up To 4 Times A Day X 1 Week Disp: Large Tube  Allergies (verified): 1)  ! Ace Inhibitors  Review of Systems      See HPI  Physical Exam  General:  VS Reviewed. Obese, non ill appearing, NAD.  Eyes:  EOMI PERRLa R eye conjunctiva is injected- dried crusted discharge on the lashes; no active drainage vision grossly intact   Impression & Recommendations:  Problem # 1:  CONJUNCTIVITIS, ACUTE, RIGHT (ICD-372.00) Assessment New  x24hours localized to the right eye Uncertain if viral vs. bacterial vs. allergies Will treat possible allergy with Zyrtec. Will provide script for erythromycin for possible bacterial infection with instructions not to fill it unless symptoms worsen over the next 6 days. Will f/u as needed.  Her updated medication list for this problem includes:    Erythromycin 5 Mg/gm Oint (Erythromycin) .Marland Kitchen... Apply  1cm ribbon to lower eyelid up to 4 times a day x 1 week disp: large tube  Orders: [facility]- Est Level  3 ([address])  Complete Medication List: 1)  Metoprolol Tartrate 100 Mg Tabs (Metoprolol tartrate) .Marland Kitchen.. 1 tab by mouth two times a day for high blood pressure 2)  Ibuprofen 600 Mg Tabs (Ibuprofen) .Marland Kitchen.. 1 tab by mouth q 6 hrs as needed body aches 3)  Erythromycin 5 Mg/gm Oint (Erythromycin) .... Apply 1cm ribbon to lower eyelid up to 4 times a day x 1 week disp: large tube  Patient Instructions: 1)  Go pick up some Zyrtec and follow the directions for possible allergic conjunctivitis. 2)  Hold on to the prescription for the weekend and if symptoms are worsening or not improving, go fill it and use it as prescribed. 3)  Follow up with your primary doctor if symptoms not improving. Prescriptions: ERYTHROMYCIN 5 MG/GM OINT (ERYTHROMYCIN) apply 1cm ribbon to lower eyelid up to 4 times a day x 1 week Disp: large tube  #1 x 1   Entered and Authorized by:   Marisue Ivan  MD   Signed by:   Marisue Ivan  MD on 06/20/2009   Method used:   Electronically to        [facility] (retail)       [address]  [place], [place]  [address]       Ph: [phone]       Fax: [phone]   RxID:   [id number]

## 2010-04-01 NOTE — Assessment & Plan Note (Signed)
Summary: READ PPD/KH  Nurse Visit   Allergies: 1)  ! Ace Inhibitors  PPD Results    Date of reading: 02/05/2010    Results: 0 mm    Interpretation: negative  Orders Added: 1)  No Charge Patient Arrived (NCPA0) [NCPA0]

## 2010-04-01 NOTE — Assessment & Plan Note (Signed)
Summary: stomach pain and Patient Summary   Vital Signs:  Patient profile:   55 year old female Height:      67 inches Weight:      319.4 pounds BMI:     50.21 Pulse rate:   68 / minute BP sitting:   150 / 100  (right arm) Cuff size:   large  Vitals Entered By: Renato Battles slade,cma CC: had cramping stomach pain lasting one week. Is Patient Diabetic? No Pain Assessment Patient in pain? no        Primary Care Provider:  Ardeen Garland  MD  CC:  had cramping stomach pain lasting one week.Marland Kitchen  History of Present Illness: Ms. Tammy [name] comes in today for stomach cramping.  Note is documented in extra detail to serve as patient summary for next primary provider.  1) SToamch cramping - sharp stomach pain and cramping associated with diarrhea last week for several days.  Eased up yesterday and no problems today.  No blood or mucuos in the stool.  No nausea or vomitting.  No fever.  Took pepto bismol and extra strength tylenol off and on which would help.  Describes  pain as across the center of her abdomen.  History of ulcers in 1991.  Used to take an "ulcer medicine" but hasn't in many years.    Habits & Providers  Alcohol-Tobacco-Diet     Tobacco Status: current     Tobacco Counseling: to quit use of tobacco products  Comments: smoking depends on stress level  Allergies: 1)  ! Ace Inhibitors  Past History:  Past Medical History: h/o PUD `91 - [facility] - 03/02/1989   Physical Exam  General:  VS Reviewed. Obese, non ill appearing, NAD.  Lungs:  Normal respiratory effort, chest expands symmetrically. Lungs are clear to auscultation, no crackles or wheezes. Heart:  Normal rate and regular rhythm. S1 and S2 normal without gallop, murmur, click, rub or other extra sounds. Abdomen:  soft, obese, nontender, nondistended, no masses, no rebound or guarding, no hepatomegaly.    Impression & Recommendations:  Problem # 1:  ABDOMINAL PAIN OTHER SPECIFIED SITE (ICD-789.09) Assessment  New  Resolved now.  Unclear etiolgoy.  Possibly gastritis or ulcers vs. viral GE.  No red flags now.  Advised to let me know if it returns.  Could consider referral to GI to eval for ulcerative disease vs. trial of ppi.  The following medications were removed from the medication list:    Ibuprofen 600 Mg Tabs (Ibuprofen) .Marland Kitchen... 1 tab by mouth q 6 hrs as needed body aches  Orders: [facility]- Est Level  3 (99371)  Problem # 2:  HYPERTENSION, BENIGN SYSTEMIC (ICD-401.1) Took her medication today.  Does not check it outside of the office.  WE have had a difficult time controlling her blood pressure.  She has some sort of skin irritation/lesions develop on her breasts when on ACE and HCTZ.  Will not take those.  WAs suspicious of any BP med for several months.  Finally agreed to try metoprolol and was SLOWLY titrated up.  Has tolerated this fine.  No issues in over a year.  Discussed today that we need to recheck this soon and if still elevated we eithe rneed to further increase toprol or consider adding a second agent.  She is agreeable to this.  Will return in 1 month.  Her updated medication list for this problem includes:    Metoprolol Tartrate 100 Mg Tabs (Metoprolol tartrate) .Marland Kitchen... 1 tab by mouth  two times a day for high blood pressure  Problem # 3:  Preventive Health Care (ICD-V70.0) Assessment: Comment Only Pap due in September 2011 Mammo due in October 2011 have not yet discussed colonoscopy.  Complete Medication List: 1)  Metoprolol Tartrate 100 Mg Tabs (Metoprolol tartrate) .Marland Kitchen.. 1 tab by mouth two times a day for high blood pressure  Prevention & Chronic Care Immunizations   Influenza vaccine: Not documented    Tetanus booster: Not documented    Pneumococcal vaccine: Not documented  Colorectal Screening   Hemoccult: Not documented    Colonoscopy: Not documented  Other Screening   Pap smear: NEGATIVE FOR INTRAEPITHELIAL LESIONS OR MALIGNANCY.  (11/01/2008)   Pap smear due:  09/2008    Mammogram: Normal  (11/18/2007)   Mammogram due: 11/2008   Smoking status: current  (08/06/2009)   Smoking cessation counseling: yes  (05/07/2008)  Lipids   Total Cholesterol: 208  (11/02/2008)   LDL: 135  (11/02/2008)   LDL Direct: Not documented   HDL: 55  (11/02/2008)   Triglycerides: 89  (11/02/2008)  Hypertension   Last Blood Pressure: 150 / 100  (08/06/2009)   Serum creatinine: 0.99  (11/02/2008)   BMP action: Ordered   Serum potassium 4.3  (11/02/2008)  Self-Management Support :   Personal Goals (by the next clinic visit) :      Personal blood pressure goal: 140/90  (11/01/2008)   Hypertension self-management support: Not documented

## 2010-04-01 NOTE — Assessment & Plan Note (Signed)
Summary: TB TEST/RH  Nurse Visit   Allergies: 1)  ! Ace Inhibitors  Immunizations Administered:  PPD Skin Test:    Vaccine Type: PPD    Site: left forearm    Mfr: GlaxoSmithKline    Dose: 0.1 ml    Route: ID    Given by: Theresia Lo RN    Exp. Date: 11/01/2011    Lot #: [id number]  Orders Added: 1)  TB Skin Test [86580] 2)  Admin 1st Vaccine [90471]  Appended Document: TB TEST/RH above manufactor is incorrect for PPD . Marland Kitchen should be [employer], not GSK.

## 2010-04-01 NOTE — Miscellaneous (Signed)
Summary: Medical Form  Patient dropped off form to be filled out for her employer.  Please call her when completed. Bradly Bienenstock  June 03, 2009 12:10 PM  form to pcp.Golden Circle RN  June 03, 2009 12:12 PM  completed and placed in to be called box. Ardeen Garland  MD  June 04, 2009 1:22 PM

## 2010-04-01 NOTE — Assessment & Plan Note (Signed)
Summary: FU/KH   Vital Signs:  Patient Profile:   55 Years Old Female Height:     68 inches Weight:      315.8 pounds BMI:     48.19 Temp:     98.1 degrees F Pulse rate:   72 / minute BP sitting:   117 / 85  (left arm)  Pt. in pain?   no  Vitals Entered By: Dedra Skeens CMA, (January 12, 2007 3:00 PM)                  Chief Complaint:  F/U HTN.  History of Present Illness: 55 yo woman who RTC today to f/u 1) Neck pain- much improved.  Taking Flexeril as needed and Naprosyn regularly.  Doing stretches w/out difficulty.  Able to sleep and move w/out pain.  2) HTN- BP excellent today.  Taking meds daily w/out difficulty.  Denies CP, SOB, HA, visual changes, edema.       Review of Systems      See HPI   Physical Exam  General:     Obese, well-developed,well-nourished,in no acute distress; alert,appropriate and cooperative throughout examination Lungs:     Normal respiratory effort, chest expands symmetrically. Lungs are clear to auscultation, no crackles or wheezes. Heart:     Normal rate and regular rhythm. S1 and S2 normal without gallop, murmur, click, rub or other extra sounds. Abdomen:     Obese, bowel sounds positive,abdomen soft and non-tender without masses, organomegaly or hernias noted. Pulses:     +2 DP, radial Extremities:     No C/C/E    Impression & Recommendations:  Problem # 1:  HYPERTENSION, BENIGN SYSTEMIC (ICD-401.1) Assessment: Improved BP is excellent today.  No medication side effects at this point.  Will check BMP to assess for Cr bump since starting an ACE.  Pt applauded for her efforts and encouraged to continue. Her updated medication list for this problem includes:    Lisinopril-hydrochlorothiazide 10-12.5 Mg Tabs (Lisinopril-hydrochlorothiazide) .Marland Kitchen... 1 tab by mouth daily  Orders: Basic Met-[facility] (69629-52841) [facility]- Est Level  3 (32440)   Problem # 2:  NECK PAIN, LEFT (ICD-723.1) Assessment: Improved Neck pain much  improved.  Taking Flexeril as needed and Naprosyn scheduled.  Told pt she may change Naprosyn to as needed.  Pt to continue neck stretches and exercises to prevent recurrence. Her updated medication list for this problem includes:    Naprosyn 500 Mg Tabs (Naproxen) .Marland Kitchen... 1 tab by mouth two times a day x 2 weeks and then as needed for pain    Flexeril 5 Mg Tabs (Cyclobenzaprine hcl) .Marland Kitchen... 1 tab by mouth at bedtime as needed for pain  Orders: [facility]- Est Level  3 (10272)   Complete Medication List: 1)  Lisinopril-hydrochlorothiazide 10-12.5 Mg Tabs (Lisinopril-hydrochlorothiazide) .Marland Kitchen.. 1 tab by mouth daily 2)  Naprosyn 500 Mg Tabs (Naproxen) .Marland Kitchen.. 1 tab by mouth two times a day x 2 weeks and then as needed for pain 3)  Flexeril 5 Mg Tabs (Cyclobenzaprine hcl) .Marland Kitchen.. 1 tab by mouth at bedtime as needed for pain   Patient Instructions: 1)  Please schedule a follow-up appointment in 2-3 months. 2)  Keep taking your medicine- your blood pressure looks FANTASTIC! 3)  Call the office a week before you need a refill- I'll call it over to the pharmacy 4)  Keep doing the neck stretches every day 5)  I'll call you if there are problems with the labs 6)  Have a GREAT holiday season!    ]

## 2010-04-01 NOTE — Assessment & Plan Note (Signed)
Summary: med problem/ls   Vital Signs:  Patient profile:   55 year old female Height:      67 inches Weight:      317.1 pounds BMI:     49.84 Temp:     98.1 degrees F oral Pulse rate:   82 / minute BP sitting:   128 / 82  (left arm) Cuff size:   large  Vitals Entered By: Garen Grams LPN (February 03, 2010 10:57 AM)  Vision Screen Left Eye w/o Correction: 20/:  20 Right Eye w/o Correction: 20/:  25 Both Eyes w/o Correction: 20/:  20 CC: f/u meds Is Patient Diabetic? No Pain Assessment Patient in pain? yes     Location: eyes  Vision Screening:Left eye w/o correction: 20 / 20 Right Eye w/o correction: 20 / 25 Both eyes w/o correction:  20/ 20        Vision Entered By: Garen Grams LPN (February 03, 2010 11:38 AM)   Primary Provider:  Demetria Pore MD  CC:  f/u meds.  History of Present Illness: Pt comes in today b/c she realized that she forgot to mention eye pain.  Pain started 1-2 weeks ago.  It is more of a pressure than stabbing/sharp. Peri-orbital, states that it starts on the outside corner of her eye and goes all the way around to the inside corner bilaterally, right worse than left.  Has been using warm compresses 2-3x/day to help with the soreness.  No increased pain w/ eye movements or leaning forward.  No photophobia.  No blurry vision or headaches. ?has to strain more to read b/c of pressure behind eyes. Pain is worse w/ blinking. Has had some clear drainage from right eye.  Also complains of some swelling under right eye where there is a stye which developed a few days ago.  That area is tender to touch, but the rest of her right eye and her left eye are not TTP.  No fevers, nasal drainage, cough.  Does report having had URI symptoms (nasal drainage, congestion, cough) a few weeks ago prior to this pain/pressure starting.  No fevers at that time.  Used vicks inhaler to help with symptoms then.  Thinks that it is related to her HCTZ.  Pt is concerned and feels  like she needs to see an eye doctor.   Preventive Screening-Counseling & Management  Alcohol-Tobacco     Alcohol drinks/day: 0     Smoking Status: current     Smoking Cessation Counseling: yes     Packs/Day: 0.5     Tobacco Counseling: to quit use of tobacco products  Current Problems (verified): 1)  Breast Pain, Left  (ICD-611.71) 2)  Hyperlipidemia, Mild  (ICD-272.4) 3)  Localized Superficial Swelling Mass or Lump  (ICD-782.2) 4)  Hypertension, Benign Systemic  (ICD-401.1) 5)  Ganglion Cyst  (ICD-727.43) 6)  Obesity, Nos  (ICD-278.00) 7)  Tobacco Dependence  (ICD-305.1) 8)  Personal History Diseases Skin&subcut Tissue  (ICD-V13.3) 9)  Unspecified Anemia  (ICD-285.9)  Current Medications (verified): 1)  Metoprolol Tartrate 100 Mg Tabs (Metoprolol Tartrate) .Marland Kitchen.. 1 Tab By Mouth Two Times A Day For High Blood Pressure 2)  Hydrochlorothiazide 25 Mg Tabs (Hydrochlorothiazide) .... Take 1 Pill Dialy in The Morning For High Blood Pressure  Allergies (verified): 1)  ! Ace Inhibitors  Physical Exam  General:  alert and well-hydrated.   Head:  normocephalic and atraumatic.   Eyes:  vision grossly intact, pupils equal, pupils round, pupils reactive to  light, pupils react to accomodation, corneas and lenses clear, mild sclera injection bilaterally, and no optic disk abnormalities.  EOMI w/o pain. No edema. Minimal swelling under right lower lid where there is a small, .5x.5cm stye present. No drainage or discharge.  Ears:  R ear normal and L ear normal.   Nose:  no external deformity, no external erythema, no nasal discharge, and no sinus percussion tenderness.   Mouth:  good dentition, pharynx pink and moist, and minimal postnasal drip.   Neck:  supple and full ROM.   Cervical Nodes:  no anterior cervical adenopathy and no posterior cervical adenopathy.     Impression & Recommendations:  Problem # 1:  EYE PAIN (ICD-379.91) Assessment New Symptoms are consistent w/ sinusitis.  Not  likely related to HCTZ.  Will try treatment for presmed sinusitis w/ high dose amox 875mg  by mouth two times a day x14 days as well as flonase two times a day.  Told pt she could do nasal saline rinses if she begins having nasal drainage.  No fevers.  Unlikely to be retinal thrombosis or other more serious cause, although cannot rule out with pt's h/o HTN. Will make optho referral b/c pt is very concerned and should see an opthomologist regardless b/c of HTN.  Pt will f/u next week to see if symptoms have started improving.  Told to go to ED if she begins experiencing blindness/vision chages, pain w/ eye movements, fevers, worsening pain behind he eyes.   Orders: Ophthalmology Referral (Ophthalmology) [facility]- Est Level  3 (27253)  Complete Medication List: 1)  Metoprolol Tartrate 100 Mg Tabs (Metoprolol tartrate) .Marland Kitchen.. 1 tab by mouth two times a day for high blood pressure 2)  Hydrochlorothiazide 25 Mg Tabs (Hydrochlorothiazide) .... Take 1 pill dialy in the morning for high blood pressure 3)  Amoxicillin 875 Mg Tabs (Amoxicillin) .Marland Kitchen.. 1 tab by mouth two times a day x14 days 4)  Flonase 50 Mcg/act Susp (Fluticasone propionate) .Marland Kitchen.. 1 spray in each nostril two times a day  Patient Instructions: 1)  I'm so sorry you had to come back in! 2)  Please make an appt to see me some time next week so we can see how your eye pain is doing. 3)  I think that it is most likely because of a sinus infection.  I'm reassured that your vision is normal and your eye pain does not get worse with bright lights or that you are having headaches. 4)  I have made a referral to opthomology for you.  This may take some time, however. 5)  I am also giving you a prescription for amoxicillin (antibiotic) for 2 weeks as well as for a nasal spray to help. 6)  PLEASE keep taking your HCTZ! And, don't forget, if you are still having breast pain try to keep a log of it so we can go over it next week. 7)  I hope your eyes start feeling  better soon! Prescriptions: FLONASE 50 MCG/ACT SUSP (FLUTICASONE PROPIONATE) 1 spray in each nostril two times a day  #1 bottle x 0   Entered and Authorized by:   Demetria Pore MD   Signed by:   Demetria Pore MD on 02/03/2010   Method used:   Electronically to        [facility] (retail)       [address]       [place], [place]  [address]  Ph: [phone]       Fax: [phone]   RxID:   [id number] AMOXICILLIN 875 MG TABS (AMOXICILLIN) 1 tab by mouth two times a day x14 days  #28 x 0   Entered and Authorized by:   Demetria Pore MD   Signed by:   Demetria Pore MD on 02/03/2010   Method used:   Electronically to        [facility] (retail)       [address]       [place], [place]  [address]       Ph: [phone]       Fax: [phone]   RxID:   [id number]    Orders Added: 1)  Ophthalmology Referral [Ophthalmology] 2)  [facility]- Est Level  3 [32951]

## 2010-04-01 NOTE — Assessment & Plan Note (Signed)
Summary: f/u,df   Vital Signs:  Patient profile:   55 year old female Weight:      317 pounds Temp:     98.1 degrees F oral Pulse rate:   69 / minute Pulse rhythm:   regular BP sitting:   165 / 107  (left arm) Cuff size:   large  Vitals Entered By: Loralee Pacas CMA (December 23, 2009 9:31 AM) CC: follow-up visit bp, Hypertension Management pt agreed to video precepting.Loralee Pacas CMA  December 23, 2009 9:33 AM   Primary Provider:  Demetria Pore MD  CC:  follow-up visit bp and Hypertension Management.  History of Present Illness: Pt comes in today for BP follow up. Having headaches from amlodipine. Started having headache every day, starting right after taking BP meds, lasting all day, "light" pounding pain in the back of her head, "just noticable," 3/10. Has not taken any medications for this because she new she was being seen for f/u soon. Pt had STOPPED taking metoprolol when she started amlodipine-- had been confused about medication instructions. Pt also concerned with her cholesterol results.  Wants to know what she can do with diet in order to decrease her cholesterol.  Also [facility] saying that she needs to return to have right breast re-imaged. Very concerned about this. Made appt to go tomorrow (10/25) for mammo to get it "taken care of" quickly.   Hypertension History:      She complains of headache and side effects from treatment, but denies chest pain, palpitations, dyspnea with exertion, peripheral edema, visual symptoms, and syncope.  She notes the following problems with antihypertensive medication side effects: Headache to Ca blocker.        Positive major cardiovascular risk factors include hyperlipidemia, hypertension, and current tobacco user.  Negative major cardiovascular risk factors include female age less than 29 years old.     Habits & Providers  Alcohol-Tobacco-Diet     Alcohol drinks/day: 0     Tobacco  Status: current     Tobacco Counseling: to quit use of tobacco products     Cigarette Packs/Day: 0.5  Medications Prior to Update: 1)  Metoprolol Tartrate 100 Mg Tabs (Metoprolol Tartrate) .Marland Kitchen.. 1 Tab By Mouth Two Times A Day For High Blood Pressure 2)  Amlodipine Besylate 5 Mg Tabs (Amlodipine Besylate) .... Take 1 Tab By Mouth Daily  Current Medications (verified): 1)  Metoprolol Tartrate 100 Mg Tabs (Metoprolol Tartrate) .Marland Kitchen.. 1 Tab By Mouth Two Times A Day For High Blood Pressure 2)  Chlorthalidone 25 Mg Tabs (Chlorthalidone) .Marland Kitchen.. 1 Tab By Mouth Daily  Allergies (verified): 1)  ! Ace Inhibitors  Physical Exam  General:  alert, well-developed, and well-nourished.   Eyes:  vision grossly intact, pupils equal, pupils round, pupils reactive to light, corneas and lenses clear, and no optic disk abnormalities.   Lungs:  normal respiratory effort, normal breath sounds, no crackles, and no wheezes.   Heart:  normal rate, regular rhythm, no murmur, no gallop, and no rub.   Abdomen:  soft, non-tender, and normal bowel sounds.   Pulses:  2+ pedal pulses Extremities:  No pedal edema Neurologic:  alert & oriented X3.   Psych:  Oriented X3 and slightly anxious.     Impression & Recommendations:  Problem # 1:  HYPERTENSION, BENIGN SYSTEMIC (ICD-401.1) Assessment Unchanged  Discussed with pt the appropriate medication regimen-- metoprolol two times a day AND chlorthalidone daily.  BP still elevated, but pt had  not been taking B-blocker. Ca blocker d/c'ed 2/2 side effect of headache. Will have pt f/u in 4 weeks for BP recheck. Pt will also be working on diet for cholesterol, but advised that this may also help with HTN.  The following medications were removed from the medication list:    Amlodipine Besylate 5 Mg Tabs (Amlodipine besylate) .Marland Kitchen... Take 1 tab by mouth daily Her updated medication list for this problem includes:    Metoprolol Tartrate 100 Mg Tabs (Metoprolol tartrate) .Marland Kitchen... 1 tab  by mouth two times a day for high blood pressure    Chlorthalidone 25 Mg Tabs (Chlorthalidone) .Marland Kitchen... 1 tab by mouth daily  Orders: [facility]- Est Level  3 ([address])  Problem # 2:  HYPERLIPIDEMIA, MILD (ICD-272.4) Assessment: New  FLP showing elevated LDL and cholesterol. Pt would like to start with dietary changes. Handouts on cholesterol and low-cholesterol foods that can be substituted for high-cholesterol foods. Will have pt f/u in 4 weeks to see how diet is going. Will plan to recheck FLP in 2-3 months to see if diet is working or if medication needs to be added. Pt agreed with this plan.   Orders: [facility]- Est Level  3 (60454)  Problem # 3:  Screening Breast Cancer (ICD-V76.10) Assessment: Comment Only Pt called back for reimaging of R breast. Will call pt when I have the results of this.   Complete Medication List: 1)  Metoprolol Tartrate 100 Mg Tabs (Metoprolol tartrate) .Marland Kitchen.. 1 tab by mouth two times a day for high blood pressure 2)  Chlorthalidone 25 Mg Tabs (Chlorthalidone) .Marland Kitchen.. 1 tab by mouth daily  Hypertension Assessment/Plan:      The patient's hypertensive risk group is category B: At least one risk factor (excluding diabetes) with no target organ damage.  Her calculated 10 year risk of coronary heart disease is 15 %.  Today's blood pressure is 165/107.  Her blood pressure goal is < 140/90.  Patient Instructions: 1)  I am sorry that you have to go back for a repeat mammogram. I hope that everything goes well! Please call me with any concerns. 2)  I am giving you some information on cholesterol. It is an important thing to keep under control since it can put you at risk for many different health problems. 3)  We will start with trying to change your diet before we have to start any new medications. 4)  Goals that you set for yourself at the visit are: 5)       Change your eating/cooking:decrease the amount of fried foods-- bake/broil instead!!! 6)       Eat more cottage cheese 7)        Use egg subsitiute instead of the whole egg 8)       Take skin off chicken 9)  I'm also sorry that the new blood pressure medicine gives you a headache. We will stop that medicine and start a different one, chlorthalidone, which you should not have problems with. It is not the medicine that caused you to have a rash before. 10)  STOP the amlodipine. 11)  TAKE metoprolol twice a day AND TAKE the chlorthalidone once per day. 12)  Please try to check your blood pressure at [facility], [facility], [employer], etc. at least one time per week until you see me again. 13)  Please come back and see me in 4 weeks so that we can see how you are doing with the new blood pressure medicine and how you diet  changes are going. 14)  Call sooner if you have any problems with your new medicine. Prescriptions: CHLORTHALIDONE 25 MG TABS (CHLORTHALIDONE) 1 tab by mouth daily  #30 x 2   Entered and Authorized by:   Demetria Pore MD   Signed by:   Demetria Pore MD on 12/23/2009   Method used:   Electronically to        [facility] (retail)       [address]       [place], [place]  [address]       Ph: [phone]       Fax: [phone]   RxID:   [id number]    Orders Added: 1)  [facility]- Est Level  3 [84132]

## 2010-04-01 NOTE — Assessment & Plan Note (Signed)
Summary: breast pain,df   Vital Signs:  Patient profile:   55 year old female Height:      67 inches Weight:      317 pounds BMI:     49.83 Temp:     98.5 degrees F oral Pulse rate:   59 / minute BP sitting:   156 / 91  (left arm)  Vitals Entered By: Tessie Fass CMA (January 31, 2010 1:38 PM)  Serial Vital Signs/Assessments:  Time      Position  BP       Pulse  Resp  Temp     By                     150/80                         Demetria Pore MD  CC: left breast pain   Primary Provider:  Demetria Pore MD  CC:  left breast pain.  History of Present Illness: Pt comes in with complaint of intermittent left breast pressure x 2 weeks, "something pressing in breast." Says it comes and goes. Nothing in particular brings it on or makes it better or worse.  Says it is a deep pain/;pressure.  Not sharp.  Often happens in the morning, which is when she takes her medicines.  Sometimes happens before, sometimes after taking them.  Hasn't felt anything like this before. Started HCTZ  ~31month ago and thinks that may be the cause of the pressure.  Of note, pt attributed a rash on her left breast to ACE-i a few years ago and was skeptical of BP meds for a while after that.  She is concerned since this is again her left breast. Cannot think of any other changes. Does not sleep with a bra on. No new back or side pain. No nausea, SOB, CP, dizziness, or numbness/tingling in her right arm or neck when these episodes occur.  Does not get worse with inspiration. Recently had mammogram (screening was nml for left, needed diagnostic for right, which was WNL, will repeat in May 2012).  Has needed to start putting a pillow under her left breast while sleeping to help with the pressure/pain.   States she has continued taking HCTZ despite pressure.  Although, has been eating badly recently.  Current Problems (verified): 1)  Hyperlipidemia, Mild  (ICD-272.4) 2)  Localized Superficial Swelling Mass or  Lump  (ICD-782.2) 3)  Hypertension, Benign Systemic  (ICD-401.1) 4)  Ganglion Cyst  (ICD-727.43) 5)  Obesity, Nos  (ICD-278.00) 6)  Tobacco Dependence  (ICD-305.1) 7)  Personal History Diseases Skin&subcut Tissue  (ICD-V13.3) 8)  Unspecified Anemia  (ICD-285.9)  Current Medications (verified): 1)  Metoprolol Tartrate 100 Mg Tabs (Metoprolol Tartrate) .Marland Kitchen.. 1 Tab By Mouth Two Times A Day For High Blood Pressure 2)  Hydrochlorothiazide 25 Mg Tabs (Hydrochlorothiazide) .... Take 1 Pill Dialy in The Morning For High Blood Pressure  Allergies (verified): 1)  ! Ace Inhibitors  Social History: Smokes 10-12cigs/day, depends on how stressed she is. Denies recreational drugs and EtOH. Livew with her daughter and 3 grandchildren. Not currently working    Physical Exam  General:  alert and well-nourished.   Lungs:  normal respiratory effort, normal breath sounds, no dullness, no crackles, and no wheezes.   Heart:  normal rate, regular rhythm, and no murmur.   Additional Exam:  Breast exam:no lumps or bumps noted in either breast, no TTP bilaterally,  no pulling or "peaux-d'orange" appearance; small bustle on outside of right breast in lower, outer quadrant, pain not reproducible   Impression & Recommendations:  Problem # 1:  BREAST PAIN, LEFT (ICD-611.71) Assessment New  Initial concerns with left sided pressure include MI/angina and PE. Unlikely to be either of these, no SOB or increased pain with inspiration, no other s/s of MI (no nausea, arm/neck numbness, sharp pains). Likely NOT related to medication (one sided, not associated with taking medication directly).  Likely MSK.  Suggested that pt make a pain diary including the time, severity, and what she is doing when the pain happens. Pt will f/u in 2-3 weeks if this pain has not gotten better. Emphasized to pt that she SHOULD keep taking HCTZ as this is likely not a medication side effect.   Orders: [facility]- Est Level  3 (95621)  Problem # 2:   HYPERTENSION, BENIGN SYSTEMIC (ICD-401.1) Assessment: Deteriorated  Up from last visit, but closer to her baseline during her previous visits. Will recheck on next visit. Could consider addition of ARB. Her updated medication list for this problem includes:    Metoprolol Tartrate 100 Mg Tabs (Metoprolol tartrate) .Marland Kitchen... 1 tab by mouth two times a day for high blood pressure    Hydrochlorothiazide 25 Mg Tabs (Hydrochlorothiazide) .Marland Kitchen... Take 1 pill dialy in the morning for high blood pressure  Orders: [facility]- Est Level  3 (30865)  Complete Medication List: 1)  Metoprolol Tartrate 100 Mg Tabs (Metoprolol tartrate) .Marland Kitchen.. 1 tab by mouth two times a day for high blood pressure 2)  Hydrochlorothiazide 25 Mg Tabs (Hydrochlorothiazide) .... Take 1 pill dialy in the morning for high blood pressure  Patient Instructions: 1)  The good news is, I don't think this is anything scary on dangerous, and I don't think it is associated with your medicine (HCTZ). 2)  You could try taking some tylenol before bed if you notice it pretty consistently in the morning.  Or, since the HCTZ has not been making you go to the bathroom more than usual, you could try taking it at night to see if you notice the pain less; however, I don't really think that it's associated with the medicine, so this likely will not help a large amount. 3)  Things that I worry about when I hear left breast pressure are things that have to do with your heart and/or lungs. Right now, everything sounds great, but I would want you to come here or go to the ED if you started having nausea, shortness of breath, dizziness, or tingling in your left arm or neck during one of these episodes of breast pressure. 4)  Come back if you are still having this pressure/pain in 2 weeks and we will think of some other things that could be causing it. 5)  You could also try sleeping in a sports bra or tank top with shelving to help give some breast support and see if that  helps. 6)  Again, PLEASE keep taking your HCTZ. I think that it is a great medicine for you and I don't think it is what is causing the pressure!!   Orders Added: 1)  [facility]- Est Level  3 [78469]

## 2010-04-01 NOTE — Assessment & Plan Note (Signed)
Summary: F/U EO   Vital Signs:  Patient profile:   55 year old female Height:      67 inches Weight:      312.4 pounds BMI:     49.11 Temp:     97.8 degrees F oral Pulse rate:   60 / minute BP sitting:   117 / 83  (left arm) Cuff size:   large  Vitals Entered By: Garen Grams LPN (January 22, 2010 1:42 PM)  Serial Vital Signs/Assessments:  Time      Position  BP       Pulse  Resp  Temp     By                     128/84                         Demetria Pore MD  CC: f/u bp, Hypertension Management Is Patient Diabetic? No Pain Assessment Patient in pain? no        Primary Provider:  Demetria Pore MD  CC:  f/u bp and Hypertension Management.  History of Present Illness: Pt comes in today for a BP follow up. States that she has been taking her meds, every day, as prescribed. No dizziness. No chest pain since hospital admission the end of October. No SOB. Mild, occasional peripheral edema.  Wondering if she can get her meds for [facility] at [facility].  She is wondering if stress could have been the cause of her chest pain.  She says that she has been staying with her daughter and grandchild and it can be very stressful.  At the time when her chest pressure started, she had been having increased stress that day.  She has also continued to try to diet.  She has been decreasing fried foods and fast food and increasing boiling/broiling foods and eating whole wheat. She has been trying to walk around her neighborhood. Walks <30 min ("I do what I can"), and has  ~13 stairs that she has to go up and down to get in her apt.  She notes, however, that she has not been walking as much now that it is getting cold, but could look into nearby gyms.  She would like a referral to the nutritionist.   Hypertension History:      She complains of dyspnea with exertion and peripheral edema, but denies headache, chest pain, palpitations, syncope, and side effects from treatment.  She notes no problems with  any antihypertensive medication side effects.        Positive major cardiovascular risk factors include hyperlipidemia, hypertension, and current tobacco user.  Negative major cardiovascular risk factors include female age less than 31 years old.     Preventive Screening-Counseling & Management  Alcohol-Tobacco     Alcohol drinks/day: 0     Smoking Status: current     Smoking Cessation Counseling: yes     Packs/Day: 0.5     Tobacco Counseling: to quit use of tobacco products  Current Problems (verified): 1)  Hyperlipidemia, Mild  (ICD-272.4) 2)  Localized Superficial Swelling Mass or Lump  (ICD-782.2) 3)  Hypertension, Benign Systemic  (ICD-401.1) 4)  Ganglion Cyst  (ICD-727.43) 5)  Obesity, Nos  (ICD-278.00) 6)  Tobacco Dependence  (ICD-305.1) 7)  Personal History Diseases Skin&subcut Tissue  (ICD-V13.3) 8)  Unspecified Anemia  (ICD-285.9)  Current Medications (verified): 1)  Metoprolol Tartrate 100 Mg Tabs (Metoprolol Tartrate) .Marland Kitchen.. 1 Tab By  Mouth Two Times A Day For High Blood Pressure 2)  Hydrochlorothiazide 25 Mg Tabs (Hydrochlorothiazide) .... Take 1 Pill Dialy in The Morning For High Blood Pressure  Allergies (verified): 1)  ! Ace Inhibitors  Physical Exam  General:  alert, well-nourished, well-hydrated, and overweight-appearing.   Eyes:  vision grossly intact, pupils equal, pupils round, pupils reactive to light, and pupils react to accomodation.   Lungs:  normal respiratory effort and normal breath sounds.   Heart:  normal rate, regular rhythm, and no murmur.   Abdomen:  soft, non-tender, and normal bowel sounds.   Extremities:  No edema.   Impression & Recommendations:  Problem # 1:  HYPERTENSION, BENIGN SYSTEMIC (ICD-401.1) BP has greatly improved with addition of HCTZ and correction of metoprolol to two times a day.  No symptoms of hypotension.  Will continue this regmin.  [facility]  Her updated medication list for  this problem includes:    Metoprolol Tartrate 100 Mg Tabs (Metoprolol tartrate) .Marland Kitchen... 1 tab by mouth two times a day for high blood pressure    Hydrochlorothiazide 25 Mg Tabs (Hydrochlorothiazide) .Marland Kitchen... Take 1 pill dialy in the morning for high blood pressure  Orders: [facility]- Est Level  3 (62130) Nutrition Referral (Nutrition)  Problem # 2:  HYPERLIPIDEMIA, MILD (ICD-272.4) Pt continues with lifestyle changes.  Will put in referral for nutritionist.  Will recheck FLP in 26mo in the week before next visit to assess if any progress has been made.  Orders: [facility]- Est Level  3 (86578) Nutrition Referral (Nutrition)  Problem # 3:  Screening Breast Cancer (ICD-V76.10) Assessment: Comment Only DIAGNOSTIC MAMMOGRAM WAS NEGATIVE, PT IS TO HAVE REPEAT MAMMO IN 6 MONTHS.  Area of density with several adjacent amorphous calcifications   located laterally within the right breast at approximately the 9-10   o'clock position most likely represents an area of evolving fat   necrosis.  Recommend follow-up right breast diagnostic mammogram   and ultrasound in 6 months.  Problem # 4:  Preventive Health Care (ICD-V70.0) Assessment: Comment Only Pt needs PPD placed and read next month. Told pt to schedule nurse visit for this.  Complete Medication List: 1)  Metoprolol Tartrate 100 Mg Tabs (Metoprolol tartrate) .Marland Kitchen.. 1 tab by mouth two times a day for high blood pressure 2)  Hydrochlorothiazide 25 Mg Tabs (Hydrochlorothiazide) .... Take 1 pill dialy in the morning for high blood pressure  Hypertension Assessment/Plan:      The patient's hypertensive risk group is category B: At least one risk factor (excluding diabetes) with no target organ damage.  Her calculated 10 year risk of coronary heart disease is 15 %.  Today's blood pressure is 117/83.  Her blood pressure goal is < 140/90.  Patient Instructions: 1)  Great job with changing your diet!  It is definitely a great step in getting healthier. 2)  We will  make a referral to the nutritionist to help with dieting strategies.  This will probably take a few months. 3)  Try to walk for 15-20 minutes or go up and down the stairs for 15-20 minutes 4-5 times per week.  Stop doing the stairs if it is bothering your knees or if you start having chest pain. 4)  Make a NURSE VISIT to have your TB test done next month. 5)  Come back and see me in 3 months and we will recheck your cholesterol.  Come back sooner if you have any problems.  Orders Added: 1)  [facility]- Est Level  3 [[address]] 2)  Nutrition Referral [Nutrition]

## 2010-04-01 NOTE — Assessment & Plan Note (Signed)
Summary: f/up,tcb   Vital Signs:  Patient profile:   55 year old female Height:      67 inches Weight:      322 pounds BMI:     50.61 BSA:     2.48 Temp:     98.3 degrees F Pulse rate:   55 / minute BP sitting:   166 / 100  Vitals Entered By: Jone Baseman CMA (December 02, 2009 8:42 AM) CC: Meet New MD, Hypertension Management Is Patient Diabetic? No Pain Assessment Patient in pain? no        Primary Provider:  Ardeen Garland  MD  CC:  Meet New MD and Hypertension Management.  History of Present Illness: Pt did not take BP meds this AM b/c she hasn't eaten. Pt says she does not ever forget morning dose, but does forget evening dose 1x/week.  No HA, SOB, CP, edema, blurry vision/vision changes.  No fatigue or lethargy (pulse is 55). BP was 166/100-- rechecked manually.   Lumps in pubic area a few weeks ago. Sore, warm washcloth decreased swelling, popped 2 days later, not sure if they were errythematous.  Not sure what the fluid that came out looked like. Still has bumps in the area but are not sore. Also has similar bump on right breast.  Was sore initially but no longer bothers her.  Pt does not want genital area checked today. Will "deal with it" another time.   Fam history of cancers: breast, ovarian, bladder.   Hypertension History:      She denies headache, chest pain, dyspnea with exertion, peripheral edema, visual symptoms, neurologic problems, and syncope.        Positive major cardiovascular risk factors include hypertension and current tobacco user.  Negative major cardiovascular risk factors include female age less than 8 years old.     Habits & Providers  Alcohol-Tobacco-Diet     Alcohol drinks/day: 0     Tobacco Status: current     Tobacco Counseling: to quit use of tobacco products     Cigarette Packs/Day: 0.5  Current Problems (verified): 1)  Localized Superficial Swelling Mass or Lump  (ICD-782.2) 2)  Hypertension, Benign Systemic  (ICD-401.1) 3)   Ganglion Cyst  (ICD-727.43) 4)  Obesity, Nos  (ICD-278.00) 5)  Tobacco Dependence  (ICD-305.1) 6)  Personal History Diseases Skin&subcut Tissue  (ICD-V13.3) 7)  Unspecified Anemia  (ICD-285.9)  Current Medications (verified): 1)  Metoprolol Tartrate 100 Mg Tabs (Metoprolol Tartrate) .Marland Kitchen.. 1 Tab By Mouth Two Times A Day For High Blood Pressure 2)  Amlodipine Besylate 5 Mg Tabs (Amlodipine Besylate) .... Take 1 Tab By Mouth Daily  Allergies (verified): 1)  ! Ace Inhibitors  Family History: Diabetes 1st degree, HTN maternal aunts with breast and ovarian ca No known CAD or CVA  No history of dialysis Mother with bladder cancer.   Physical Exam  General:  alert, well-developed, well-nourished, and well-hydrated.   Eyes:  vision grossly intact, pupils equal, pupils round, and pupils reactive to light.   Lungs:  normal respiratory effort, normal breath sounds, no crackles, and no wheezes.   Heart:  normal rate, regular rhythm, no murmur, and no gallop.   Abdomen:  soft, non-tender, normal bowel sounds, and no distention.   Genitalia:  Pt denied.  Msk:  normal ROM.   Pulses:  2+ pedal pulses. Extremities:  No LE edema.  Neurologic:  alert & oriented X3.   Skin:  1x1 erythematous area on R breast. Non tender. Blanchable.  Nodular area under skin. No fluctuance.    Impression & Recommendations:  Problem # 1:  HYPERTENSION, BENIGN SYSTEMIC (ICD-401.1) Assessment Deteriorated  BP 166/100, P 55. Pt states she is taking her medicine regularly. Will check BMET today. Added Ca blocker since pulse is too low to inrease B-blocker. Pt states she will try this. Pt is to check BPs daily or every other day. RTC 1-2 weeks for BP recheck.  No symptoms of HTN. No symptoms from medication.  Her updated medication list for this problem includes:    Metoprolol Tartrate 100 Mg Tabs (Metoprolol tartrate) .Marland Kitchen... 1 tab by mouth two times a day for high blood pressure    Amlodipine Besylate 5 Mg Tabs  (Amlodipine besylate) .Marland Kitchen... Take 1 tab by mouth daily  Orders: Basic Met-[facility] ([address]) Lipid-[facility] ([address]-91478) [facility]- Est Level  3 (29562)  Problem # 2:  TOBACCO DEPENDENCE (ICD-305.1)  Pt did not want to discuss cessation.   Orders: [facility]- Est Level  3 (13086)  Problem # 3:  Preventive Health Care (ICD-V70.0) FLP today. Mammography information given. No Pap needed until 9/13. Colonoscopy will be discussed at next visit.   Problem # 4:  LOCALIZED SUPERFICIAL SWELLING MASS OR LUMP (ICD-782.2) Pubic area and R breast. Pt denied genital exam today. Will reasses area on breast at next clinic appt. If increasing erythema or if becomes painful, will consider starting Keflex for presumed folliculitis.   Complete Medication List: 1)  Metoprolol Tartrate 100 Mg Tabs (Metoprolol tartrate) .Marland Kitchen.. 1 tab by mouth two times a day for high blood pressure 2)  Amlodipine Besylate 5 Mg Tabs (Amlodipine besylate) .... Take 1 tab by mouth daily  Hypertension Assessment/Plan:      The patient's hypertensive risk group is category B: At least one risk factor (excluding diabetes) with no target organ damage.  Her calculated 10 year risk of coronary heart disease is 15 %.  Today's blood pressure is 166/100.  Her blood pressure goal is < 140/90.  Patient Instructions: 1)  It was great meeting you today! 2)  Your blood pressure is still high. We're starting you on a new medicine. Call [facility] to see if it's on their $4 plan, if not, it is on the [employer]. 3)  Please try to check your BP ever day or every other day. Write the numbers down. 4)  Please come back to see me in 1-2 weeks so we can see how you are doing.  5)  Tobacco is very bad for your health and your loved ones! You Should stop smoking!. 6)  Stop Smoking Tips: Choose a Quit date. Cut down before the Quit date. decide what you will do as a substitute when you feel the urge to smoke(gum,toothpick,exercise). 7)  Schedule your  mammogram.   Prevention & Chronic Care Immunizations   Influenza vaccine: Not documented   Influenza vaccine deferral: Deferred  (12/02/2009)    Tetanus booster: Not documented    Pneumococcal vaccine: Not documented  Colorectal Screening   Hemoccult: Not documented    Colonoscopy: Not documented  Other Screening   Pap smear: NEGATIVE FOR INTRAEPITHELIAL LESIONS OR MALIGNANCY.  (11/01/2008)   Pap smear action/deferral: Deferred-3 yr interval  (12/02/2009)   Pap smear due: 11/01/2011    Mammogram: Normal  (11/18/2007)   Mammogram action/deferral: Ordered  (12/02/2009)   Mammogram due: 11/2008   Smoking status: current  (12/02/2009)   Smoking cessation counseling: yes  (12/02/2009)  Lipids   Total Cholesterol: 208  (  11/02/2008)   Lipid panel action/deferral: Lipid Panel ordered   LDL: 135  (11/02/2008)   LDL Direct: Not documented   HDL: 55  (11/02/2008)   Triglycerides: 89  (11/02/2008)   Lipid panel due: 12/03/2010  Hypertension   Last Blood Pressure: 166 / 100  (12/02/2009)   Serum creatinine: 0.99  (11/02/2008)   BMP action: Ordered   Serum potassium 4.3  (11/02/2008)    Hypertension flowsheet reviewed?: Yes   Progress toward BP goal: Deteriorated  Self-Management Support :   Personal Goals (by the next clinic visit) :      Personal blood pressure goal: 140/90  (11/01/2008)   Hypertension self-management support: Not documented

## 2010-04-01 NOTE — Assessment & Plan Note (Signed)
Summary: flu,tcb   Vital Signs:  Patient profile:   55 year old female Weight:      307.5 pounds O2 Sat:      97 % on Room air Temp:     98.9 degrees F oral Pulse rate:   68 / minute BP sitting:   140 / 88  (right arm) Cuff size:   large  Vitals Entered By: Arlyss Repress CMA, (April 01, 2009 3:02 PM)  O2 Flow:  Room air CC: chills, body aches. has been in bed since friday. did not have flu shot this year. Is Patient Diabetic? No Pain Assessment Patient in pain? yes     Location: body Intensity: 10 Onset of pain  since friday   Primary Care Provider:  Ardeen Garland  MD  CC:  chills and body aches. has been in bed since friday. did not have flu shot this year.Marland Kitchen  History of Present Illness: Tammy [name] comes in today for flu-like symptoms since Friday.  Her grandchildren visited Friday and were sick and very soon after they left she began to feel poorly.  Mostly experiencing headache, body aches, and subjective fever and chills.  Has not taken her temperature.  Slight cough, dry, but not particularly bothersome.  no sore throat.  No nasal congestion.  No ear pain.  No abdominal pain.  No nausea, vomitting, diarrhea.  Using extra strength tylenol which is helping the body aches some.   Habits & Providers  Alcohol-Tobacco-Diet     Tobacco Status: current     Tobacco Counseling: to quit use of tobacco products  Allergies: 1)  ! Ace Inhibitors  Physical Exam  General:  obese, alert, ill-appearing but in NAD vitals reviewed Eyes:  conjunctiva clear and moist, no injection Ears:  right TM normal, left TM injected but with good position and landmarks Mouth:  oropharynx pink and moist.  Lungs:  normal WOB, CTAB Heart:  RRR without murmur Skin:  no rash Cervical Nodes:  no LAD   Impression & Recommendations:  Problem # 1:  VIRAL URI (ICD-465.9) Assessment New  Symptoms consistent with viral URI.  No indication of serious bacterial infection so no indication for  antibiotics.  Advise symptomatic treatment - ibuprofen, rest, fluids.  Her updated medication list for this problem includes:    Ibuprofen 600 Mg Tabs (Ibuprofen) .Marland Kitchen... 1 tab by mouth q 6 hrs as needed body aches  Orders: [facility]- Est Level  3 ([address])  Complete Medication List: 1)  Bactroban 2 % Crea (Mupirocin calcium) .... Apply to affected area tid.  disp 30 grams 2)  Metoprolol Tartrate 100 Mg Tabs (Metoprolol tartrate) .Marland Kitchen.. 1 tab by mouth two times a day for high blood pressure 3)  Ibuprofen 600 Mg Tabs (Ibuprofen) .Marland Kitchen.. 1 tab by mouth q 6 hrs as needed body aches  Patient Instructions: 1)  You appear to have a viral respiratory infection.  It could be the flu, but usually the flu has a high fever and cough.  Either way though, there is no quick fix for a virus.  An antibiotic does not improve it any faster at all. 2)  Use the ibuprofen 600 for your body aches.  Be sure to get plenty of rest and drink plenty of fluids.  Keep your diet bland and advance it slowly.   3)  IT can take 7-10 days to fully resolve, though I would expect you to be feeling a little better by this weekend.  If you worsen instead of  improve, please return.  Prescriptions: IBUPROFEN 600 MG TABS (IBUPROFEN) 1 tab by mouth q 6 hrs as needed body aches  #30 x 1   Entered and Authorized by:   Ardeen Garland  MD   Signed by:   Ardeen Garland  MD on 04/01/2009   Method used:   Electronically to        [facility] (retail)       [address]       [place], [place]  [address]       Ph: [phone]       Fax: [phone]   RxID:   [id number]

## 2010-04-01 NOTE — Progress Notes (Signed)
Summary: phn msg  Phone Note Call from Patient   Caller: Patient Summary of Call: Wanted Dr. Fara Boros know that she is in the hospital room 4731.   Initial call taken by: Clydell Hakim,  December 27, 2009 9:01 AM  Follow-up for Phone Call        I will try to visit today after clinic Follow-up by: Demetria Pore MD,  December 27, 2009 1:27 PM

## 2010-04-01 NOTE — Assessment & Plan Note (Signed)
Summary: NP,CYST IN [facility]   Primary Provider:  Ardeen Garland  MD   History of Present Illness: Insidious onset of volar left hand pain within past few weeks. Pain along A1 pulley distribution of 3rd MCP joint. Felt a painful nodule along this area. No triggering, swelling, erythema, or paresthesias. No hx of DM, thyroid disease, inflammatory disease.   Pain worst on palpation; relieved by cessation of palpation.  Allergies: 1)  ! Ace Inhibitors PMH-FH-SH reviewed for relevance  Physical Exam  General:  Well-developed,well-nourished,in no acute distress; alert,appropriate and cooperative throughout examination Msk:  LEFT HAND: Small 1-22mm Depyutren's nodule along A1 pulley with moderate ttp. No triggering, swelling, erythema, or increased warmth. Intact skin. Full ROM/strength. Normal neurovascular examination.   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  After obtaining informed verbal consent from the patient, the (   ) aspect of (    ) was prepped with alcohol and betadine. Ethyl chloride was used to anesthetize the skin. A 0.59ml to 1ml mixture of lidocaine and kenalog 10mg /ml was injected into the the nodule and over the A1 pulley of the left 3rd finger without complications or difficulty. The patient tolerated this procedure well.  - stop smoking. - otc ibuprofen per package instructions as needed for pain. - Immediately seek MD attention for fevers, hand swelling, hand discoloration, increased hand pain, or any other concerns. Otherwise  f/u with her PCP within 2 weeks.  Orders: Aspirate/Inject Ganglion Cyst (20612) Kenalog 10 mg inj (J3301)  Problem # 2:  HYPERTENSION, BENIGN SYSTEMIC (ICD-401.1) Asymptomatic without cppp/sob/nv/abd pn/weakness/paresthesias/headache, speech/vision change.  - low salt diet. - f/u with her pcp ASAP. Immediately seek MD  attention for cppp, sob, headache, speech/vision change, weakness, numbness, tingling, or any other concerns.  Her updated medication list for this problem includes:    Metoprolol Tartrate 100 Mg Tabs (Metoprolol tartrate) .Marland Kitchen... 1 tab by mouth two times a day for high blood pressure  Complete Medication List: 1)  Metoprolol Tartrate 100 Mg Tabs (Metoprolol tartrate) .Marland Kitchen.. 1 tab by mouth two times a day for high blood pressure  Appended Document: NP,CYST IN HAND,[facility] CORRECTION TO PROCEDURE: After obtaining informed verbal consent from the patient, the palmar aspect of her left hand overlying the site of her nodule and ttp was prepped with alcohol and betadine. Ethyl chloride was used to anesthetize the skin. A 0.70ml to 1ml mixture of lidocaine and kenalog 10mg /ml was injected around the nodule, over the A1 pulley of the left 3rd finger without complications or difficulty. The patient tolerated this procedure well.

## 2010-04-01 NOTE — Miscellaneous (Signed)
Summary: Re: ophth and GI referral  Clinical Lists Changes received notification from  [facility] that they are unable to complete the referrals for ophthalmology and GI at this time due to lack of volunteer physicians in this speciality group. They will notify patient when the referrals can be processed. Theresia Lo RN  September 03, 2009 4:45 PM

## 2010-04-01 NOTE — Progress Notes (Signed)
  Phone Note Outgoing Call   Call placed by: Demetria Pore MD,  January 04, 2010 7:39 PM Call placed to: Patient Summary of Call: Wanted to f/u with pt that she was able to get her BP meds. Pt states that she has them and has been taking them. No side effects. Feeling better than last weekend. Has been doing well since hospital d/c. Will f/u with me 11/23.  Initial call taken by: Demetria Pore MD,  January 04, 2010 7:40 PM     Appended Document:  Of note, pt also told me that she received a letter from the breast clinic stating that her repeat mammogram looked fine. She is to f/u in 6 months for another mammogram.

## 2010-04-01 NOTE — Progress Notes (Signed)
Summary: re: new HTN meds/TS  Phone Note Call from Patient Call back at [phone]   Caller: Patient Summary of Call: Pt seen yesterday and the medicine she was perscribed was too expensive.  What should she do? Initial call taken by: Clydell Hakim,  December 03, 2009 12:11 PM  Follow-up for Phone Call        CALLED PT. Pt reports, that her new HTN meds (Amlodipine) is too expensive. $16 per month. she can not afford it. I told the pt, that I would send the message to Dr.McG. and she may can substitute it with meds on the $4 formulary. pt agreed. Follow-up by: Arlyss Repress CMA,,  December 03, 2009 12:31 PM  Additional Follow-up for Phone Call Additional follow up Details #1::        It is $3.99 at [facility]. If she can absolutely not get to a HT, then we can change her to Verapamil 80mg  three times a day, which is on the [employer] $4 plan, but I would [facility] if she could get amlodipine from [employer]. Additional Follow-up by: Demetria Pore MD,  December 03, 2009 1:03 PM    Additional Follow-up for Phone Call Additional follow up Details #2::    called pt. pt reports, that she got her meds at [facility] already. reports, that she feels kind of balance in the morning. advised pt  to get up slowly and wait for about one minute before standing up. pt agreed. Follow-up by: Arlyss Repress CMA,,  December 06, 2009 12:01 PM

## 2010-04-03 NOTE — Assessment & Plan Note (Signed)
Summary: f/u  kh   Vital Signs:  Patient profile:   55 year old female Height:      67 inches Weight:      316 pounds BMI:     49.67 Temp:     98.3 degrees F oral Pulse rate:   57 / minute BP sitting:   146 / 84  (right arm) Cuff size:   large  Vitals Entered By: Jimmy Footman, CMA (February 11, 2010 8:47 AM) CC: follow up, Hypertension Management Is Patient Diabetic? No   Primary Provider:  Demetria Pore MD  CC:  follow up and Hypertension Management.  History of Present Illness: 1. Needs to come off HCTZ because of breast pain.  Still just right sided.  Needed to put warm compress on it last night because pain was so bad.  Sharp pain in "fat tissue."  Pain deep down.  Comes and goes.  Mostly at night when in bed b/c "they flop over".  Can feel stinging sensation when she puts on "good bra."  Same area as where she had problems with ACE-is.  Top and under breast. Hurting worse now. Tingles and stings.   Is concerned that the medicine is "doing something bad" to her breast tissue since she is having the pain and wants to know if it could be breaking down the breast tissue.   2. Eye pain has resolved. Still on abx. Swelling, drainage all gone.   Hypertension History:      She complains of side effects from treatment, but denies headache, chest pain, peripheral edema, and visual symptoms.  She notes the following problems with antihypertensive medication side effects: ?breast pain from HCTZ.        Positive major cardiovascular risk factors include hyperlipidemia, hypertension, and current tobacco user.  Negative major cardiovascular risk factors include female age less than 53 years old.     Preventive Screening-Counseling & Management  Alcohol-Tobacco     Smoking Status: current  Current Problems (verified): 1)  Screening Examination For Pulmonary Tuberculosis  (ICD-V74.1) 2)  Eye Pain  (ICD-379.91) 3)  Breast Pain, Left  (ICD-611.71) 4)  Hyperlipidemia, Mild   (ICD-272.4) 5)  Localized Superficial Swelling Mass or Lump  (ICD-782.2) 6)  Hypertension, Benign Systemic  (ICD-401.1) 7)  Ganglion Cyst  (ICD-727.43) 8)  Obesity, Nos  (ICD-278.00) 9)  Tobacco Dependence  (ICD-305.1) 10)  Personal History Diseases Skin&subcut Tissue  (ICD-V13.3) 11)  Unspecified Anemia  (ICD-285.9)  Current Medications (verified): 1)  Metoprolol Tartrate 100 Mg Tabs (Metoprolol Tartrate) .Marland Kitchen.. 1 Tab By Mouth Two Times A Day For High Blood Pressure 2)  Hydrochlorothiazide 25 Mg Tabs (Hydrochlorothiazide) .... Take 1 Pill Dialy in The Morning For High Blood Pressure 3)  Amoxicillin 875 Mg Tabs (Amoxicillin) .Marland Kitchen.. 1 Tab By Mouth Two Times A Day X14 Days 4)  Flonase 50 Mcg/act Susp (Fluticasone Propionate) .Marland Kitchen.. 1 Spray in Each Nostril Two Times A Day  Allergies (verified): 1)  ! Ace Inhibitors  Physical Exam  General:  alert, well-developed, well-nourished, and well-hydrated.   Breasts:  deferred-- per pt, no new masses, no masses felt on previous exam last month, nml mammogram 2 months ago. Heart:  normal rate, regular rhythm, and no murmur.     Impression & Recommendations:  Problem # 1:  BREAST PAIN, LEFT (ICD-611.71) Assessment Deteriorated  Continues to have left breast pain.  Mammogram in October was normal for left breast (right required diagnostic, which was normal). Unlikely related to HCTZ, but pt is  very ademant about it, so we will try going off of HCTZ and reassess in 1 week.  If continues having pain, will need to decide on a  breast pain work up with normal mammogram.  To consider for workup of noncyclic breast pain (mastalgia): -Causes (per UpToDate): large pendulous breasts, diet/lifestyle, less likely inflammatory breast cancer, hidradenitis suppurativa; can be caused by variety of meds, prior breast surgert, cardiovascular agents, antibiotics; can be caused by chest wall pain (often lateral, MSK source, other medical problems like biliary disease,  spinal/paraspinal problem= burning pain from nerve root pressure) -Evaluation: nml PE --> targeted ultrasound and updated mammogram; lab tests aren't useful -Treatment: low fat diet, decrease caffiene, stop smoking/nicotine; supportive steel underwire bras and sports bras; warm conpresses, ice packs, gentle massage -Meds: can use tylenol or NSAIDs or topical NSAID (aspercreme, nuprin, flector patch)  Things left to do= targeted ultrasound.  Orders: [facility]- Est Level  3 ([id number])  Problem # 2:  HYPERTENSION, BENIGN SYSTEMIC (ICD-401.1) Assessment: Comment Only  146/84 today, but has not yet taken meds this morning (takes them at 9/930).  BP was in the 160s when on only metoprolol before adding 2nd agent.  Cannot increase B-blocker b/c lowish HR (50s).  Cannot use ACE-i because they cause left breast rash.  Ca blockers (amlodipine) caused headaches. HCTZ now possibly causing breast pain. Pt had been tolerating chlorthalidone (was switched to HCTZ when she was admitted to hospital end of Oct)-- no breast pain before hospitalization.    Will give pt 1 week off of HCTZ (will continue on metorpolol only) with knowledge that her BP may increase to 160s.  However, I think that it would be more beneficial to not start a new medicine at this time since pt is already very skeptical of medications and would like to see how her pain changes w/o any meds (other than metoprolol, which she knows has not given her any problems).  Gave pt precautions to go to ED (chest pain, SOB, blurry vision, dizziness).  Will recheck BP in 1 week and will either restart HCTZ if pain has not improved, or restart chlorthalidone since pt will likely need a second agent.   The following medications were removed from the medication list:    Hydrochlorothiazide 25 Mg Tabs (Hydrochlorothiazide) .Marland Kitchen... Take 1 pill dialy in the morning for high blood pressure Her updated medication list for this problem includes:    Metoprolol Tartrate 100 Mg  Tabs (Metoprolol tartrate) .Marland Kitchen... 1 tab by mouth two times a day for high blood pressure  Orders: [facility]- Est Level  3 (29562)  Problem # 3:  UNSPECIFIED ANEMIA (ICD-285.9) Assessment: Comment Only  Pt would like to have CBC checked b/c she knows she has had low iron in the past and "stays cold" all the time.    Will check CBC and TSH at visit next week.   Orders: [facility]- Est Level  3 (13086)  Complete Medication List: 1)  Metoprolol Tartrate 100 Mg Tabs (Metoprolol tartrate) .Marland Kitchen.. 1 tab by mouth two times a day for high blood pressure 2)  Amoxicillin 875 Mg Tabs (Amoxicillin) .Marland Kitchen.. 1 tab by mouth two times a day x14 days 3)  Flonase 50 Mcg/act Susp (Fluticasone propionate) .Marland Kitchen.. 1 spray in each nostril two times a day  Hypertension Assessment/Plan:      The patient's hypertensive risk group is category B: At least one risk factor (excluding diabetes) with no target organ damage.  Her calculated 10 year risk of coronary heart  disease is 20 %.  Today's blood pressure is 146/84.  Her blood pressure goal is < 140/90.  Patient Instructions: 1)  SInce you are still having the breast pain, let's try stopping the HCTZ for 1 weeks.  CONTINUE your metoprolol twice a day, since we know that you were not having any problems on it before we started a new medicine. 2)  Come back and see me next week (I have clinic all day Wed 12/21) and we will recheck your blood pressure and discuss how your breast pain is doing.  If it is better, we will make sure we put in your chart that you cannot take HCTZ.  If it is not better, we will re-start the HCTZ since it has been helping with your blood pressure and will look into other reasons for the breast pain. 3)  Even if your breast pain continues, I do not think that this is a breast cancer, since your mammogram a few months ago was normal for that left breast. 4)  I'm glad that your eye pain, however, is better!   Orders Added: 1)  [facility]- Est Level  3 [16109]

## 2010-04-03 NOTE — Assessment & Plan Note (Signed)
Summary: Finger Injection/kf   Vital Signs:  Patient profile:   55 year old female Height:      67 inches Temp:     98.0 degrees F oral Pulse rate:   49 / minute BP sitting:   159 / 83  (right arm)  Vitals Entered By: Tessie Fass CMA (March 25, 2010 1:51 PM) CC: RIGHT HAND PAIN Pain Assessment Patient in pain? yes     Location: right hand Intensity: 7   Primary Care Provider:  Demetria Pore MD  CC:  RIGHT HAND PAIN.  History of Present Illness: Pt presents to clinic today for a scheduled trigger finger injection. She carries a previous DX. Dr. Fara Boros who was scheduled for the injection today is ill and unavalable.  No new hand pain. No rash. Well NAD  Current Problems (verified): 1)  Unspec Local Infection Skin&subcutaneous Tissue  (ICD-686.9) 2)  Hirsutism  (ICD-704.1) 3)  Screening Examination For Pulmonary Tuberculosis  (ICD-V74.1) 4)  Eye Pain  (ICD-379.91) 5)  Breast Pain, Left  (ICD-611.71) 6)  Hyperlipidemia, Mild  (ICD-272.4) 7)  Localized Superficial Swelling Mass or Lump  (ICD-782.2) 8)  Hypertension, Benign Systemic  (ICD-401.1) 9)  Ganglion Cyst  (ICD-727.43) 10)  Obesity, Nos  (ICD-278.00) 11)  Tobacco Dependence  (ICD-305.1) 12)  Personal History Diseases Skin&subcut Tissue  (ICD-V13.3) 13)  Unspecified Anemia  (ICD-285.9)  Current Medications (verified): 1)  Metoprolol Tartrate 100 Mg Tabs (Metoprolol Tartrate) .Marland Kitchen.. 1 Tab By Mouth Two Times A Day For High Blood Pressure 2)  Flonase 50 Mcg/act Susp (Fluticasone Propionate) .Marland Kitchen.. 1 Spray in Each Nostril Two Times A Day 3)  Bacitracin 500 Unit/gm Oint (Bacitracin) .... Apply To Affected Areas Three Times A Day  Allergies: 1)  ! Ace Inhibitors 2)  ! Hydrochlorothiazide  Past History:  Past Medical History: Last updated: 12/27/2009 h/o PUD `91 - [facility] - 03/02/1989  hypertension abnormal mammogram  Review of Systems  The patient denies anorexia, fever, and weight loss.    Physical  Exam  General:  alert, well-developed, well-nourished, and well-hydrated.   Msk:  Left hand: Normal appearing. Slightly ttp over 1st pully of the 3rd digit of the left hand. Near the volar crease.  Additional Exam:  Injection note: Concent obtained. 1st pully maked with an indent. Area cleaned with betadine. 0.72ml of 40mg /ml kennalog with 0.62ml of 1% lidocaine without epinepherine was injected into the tendon sheath without complications. Injected area just distal to volar crease. 25 guage needle passed until resistance met with tendon. Needle backed off slightly and fluid injected without resistance.  Fingers buddy taped following injection.   Impression & Recommendations:  Problem # 1:  UNSPEC LOCAL INFECTION SKIN&SUBCUTANEOUS TISSUE (ICD-686.9) Assessment Unchanged  Trigger finger: Area injected. Then pt given instructions. Finger buddy taped. Pt will f/u with Dr. Fara Boros in 2-4 weeks to start passive stretching exercises. Red flags reviewed.  se pt instructions.   Orders: Injection, small joint- [facility] (20600)  Complete Medication List: 1)  Metoprolol Tartrate 100 Mg Tabs (Metoprolol tartrate) .Marland Kitchen.. 1 tab by mouth two times a day for high blood pressure 2)  Flonase 50 Mcg/act Susp (Fluticasone propionate) .Marland Kitchen.. 1 spray in each nostril two times a day 3)  Bacitracin 500 Unit/gm Oint (Bacitracin) .... Apply to affected areas three times a day  Patient Instructions: 1)  Rest for three days, avoiding all gripping and grasping. 2)  Buddy tape to the adjacent finger for the first few days. 3)  Apply ice (15 minutes  every four to six hours) and take acetaminophen TYLENOL (1000 mg twice daily) as needed for pain. 4)  Protect the fingers for three to four weeks by avoiding repetitive gripping, grasping, pressure over the MP heads, and vibration. 5)  Begin passive stretching exercises of the finger in extension at three weeks. 6)  Use padded gloves or padded tools for long-term prevention in  recurrent cases. 7)  If you have chest pain, difficulty breathing, fevers over 102 that does not get better with tylenol please call us or see a doctor.  8)  See Dr. Fara Boros in 2-4 weeks.    Orders Added: 1)  Injection, small joint- [facility] [20600]

## 2010-04-03 NOTE — Assessment & Plan Note (Signed)
Summary: fu spots on breast/kh   Vital Signs:  Patient profile:   55 year old female Weight:      319 pounds Pulse rate:   60 / minute BP sitting:   137 / 76  (right arm) Cuff size:   large  Vitals Entered By: Arlyss Repress CMA, (March 21, 2010 1:50 PM) CC: left middle finger locks up x 2-3 weeks. form to fill out for work Is Patient Diabetic? No Pain Assessment Patient in pain? no        Primary Provider:  Demetria Pore MD  CC:  left middle finger locks up x 2-3 weeks. form to fill out for work.  History of Present Illness: Presents for f/u today.  States that all of her breast pain and rash on her breast have gotten better b/c she is off HCTZ.  Has not needed any tylenol and can sleep bra-less again b/c she's not taking the med anymore.  Has been using the abx ointment, but thinks that it was the BP medication.  Main complaint today is her middle finger on her left hand locks up.  It has been happening for 2-3 weeks  Her middle finger "locks" in the MIP joint and she has to force it to open.  She [facility] seen [facility] for the same problem and was diagnosed w/ Depyutren's nodule along A1 pulley, which was helped by trigger point injection w/ kenalog.  She has been soaking her hand in warm water, which seems to help.  It is very sore and she has a knot on her palmer surface.  She has not tried any anti-inflammatories or pain meds.   Current Problems (verified): 1)  Unspec Local Infection Skin&subcutaneous Tissue  (ICD-686.9) 2)  Hirsutism  (ICD-704.1) 3)  Screening Examination For Pulmonary Tuberculosis  (ICD-V74.1) 4)  Eye Pain  (ICD-379.91) 5)  Breast Pain, Left  (ICD-611.71) 6)  Hyperlipidemia, Mild  (ICD-272.4) 7)  Localized Superficial Swelling Mass or Lump  (ICD-782.2) 8)  Hypertension, Benign Systemic  (ICD-401.1) 9)  Ganglion Cyst  (ICD-727.43) 10)  Obesity, Nos  (ICD-278.00) 11)  Tobacco Dependence  (ICD-305.1) 12)  Personal History Diseases  Skin&subcut Tissue  (ICD-V13.3) 13)  Unspecified Anemia  (ICD-285.9)  Current Medications (verified): 1)  Metoprolol Tartrate 100 Mg Tabs (Metoprolol Tartrate) .Marland Kitchen.. 1 Tab By Mouth Two Times A Day For High Blood Pressure 2)  Flonase 50 Mcg/act Susp (Fluticasone Propionate) .Marland Kitchen.. 1 Spray in Each Nostril Two Times A Day 3)  Bacitracin 500 Unit/gm Oint (Bacitracin) .... Apply To Affected Areas Three Times A Day  Allergies: 1)  ! Ace Inhibitors 2)  ! Hydrochlorothiazide  Physical Exam  General:  alert, well-developed, well-nourished, and well-hydrated.   Msk:  normal ROM, no joint tenderness, no joint swelling, no joint warmth, and no redness over joints.  + small nodule on the palmer surface of left 3rd finger.  Additional Exam:  Breasts: nicely healing areas on bilat breasts, less erythema, no exudate or d/c   Impression & Recommendations:  Problem # 1:  UNSPEC LOCAL INFECTION SKIN&SUBCUTANEOUS TISSUE (ICD-686.9) resolving w/ abx ointment  Problem # 2:  BREAST PAIN, LEFT (ICD-611.71) resolved  Problem # 3:  HYPERTENSION, BENIGN SYSTEMIC (ICD-401.1) BPs normotensive w/ metoprolol.  Will continue w/ just this agent for now.  Cannot increase dose since HR will not tolerate (high 50s-low 60s).  Her updated medication list for this problem includes:    Metoprolol Tartrate 100 Mg Tabs (Metoprolol tartrate) .Marland KitchenMarland KitchenMarland KitchenMarland Kitchen 1  tab by mouth two times a day for high blood pressure  Orders: Basic Met-[facility] ([id number])  Problem # 4:  LOCALIZED SUPERFICIAL SWELLING MASS OR LUMP (ICD-782.2) Likely recurrence of Depyutren's nodule along A1 pulley.  Pt will come back on Tues 1/24 for tirgger point injection.  Will use 0.39mL lidocaine and 0.25mL kenalog; will need 25 guage 1- 1.5in needle, betadine, and cold spray.  Complete Medication List: 1)  Metoprolol Tartrate 100 Mg Tabs (Metoprolol tartrate) .Marland Kitchen.. 1 tab by mouth two times a day for high blood pressure 2)  Flonase 50 Mcg/act Susp (Fluticasone  propionate) .Marland Kitchen.. 1 spray in each nostril two times a day 3)  Bacitracin 500 Unit/gm Oint (Bacitracin) .... Apply to affected areas three times a day  Patient Instructions: 1)  Please start taking an multivitamin.  In addition, you should take some iron (FeSO4) take 325mg  1 time per day.  While you are taking the iron you should also take a stool softener like colace.  2)  Take ibuprofen 2 tabs every 8 hours and come back to see me on Tues 1/ 24 at 1:30 for the joint injection.  You'll be first so we'll get it finished quickly. 3)  We'll check your kidney function today.   Orders Added: 1)  Basic Met-[facility] [[id number]]

## 2010-04-03 NOTE — Miscellaneous (Signed)
Summary: Procedures consent  Procedures consent   Imported By: De Nurse 03/28/2010 15:26:27  _____________________________________________________________________  External Attachment:    Type:   Image     Comment:   External Document

## 2010-04-03 NOTE — Assessment & Plan Note (Signed)
Summary: F/U  KH   Vital Signs:  Patient profile:   55 year old female Height:      67 inches Weight:      321.5 pounds BMI:     50.54 Pulse rate:   58 / minute BP sitting:   132 / 83  (right arm)  Vitals Entered By: Arlyss Repress CMA, (February 19, 2010 10:40 AM) CC: f/up HTN and re-check left breast pain. has been going on for over one month. denies d/c or swelling. last mammogram 10-11. Is Patient Diabetic? No Pain Assessment Patient in pain? yes     Location: left breast Intensity: 7 Onset of pain  x 1 month   Primary Provider:  Demetria Pore MD  CC:  f/up HTN and re-check left breast pain. has been going on for over one month. denies d/c or swelling. last mammogram 10-11.[facility]  History of Present Illness: Pt comes in for breast pain follow up.  D/c'ed pt's HCTZ 2/2 her strong belief that her breast pain was medication related (also had problem with ACE-i causing rash on left breast, so very leery of any medication).  Pt states that the pain continues, but is only at night.  She has been wearing a sports bra when she sleeps but continues to have pain under her left breast when she lays on her right side to sleep and her breast falls to the right side.  In addition, she is having pain on the top of her right breast around a lesion which has previously been scarring but has now "opened back up from the inside out."  Thinks that her left breast has also been erythematous.  She has no complaints with her right breast.  She has needed to take Tylenol some night b/c of the pain.  Warm compresses help the area that has broken out from the old scarring.  She has not noticed any pus or drainage and has not had any fevers.   Current Medications (verified): 1)  Metoprolol Tartrate 100 Mg Tabs (Metoprolol Tartrate) .Marland Kitchen.. 1 Tab By Mouth Two Times A Day For High Blood Pressure 2)  Flonase 50 Mcg/act Susp (Fluticasone Propionate) .Marland Kitchen.. 1 Spray in Each Nostril Two Times A Day  Allergies  (verified): 1)  ! Ace Inhibitors  Physical Exam  General:  alert, well-developed, well-nourished, and overweight-appearing.   Skin:  small 1x1cm lesion on top of left breast with surrounding hyperpigmented scar area, approx 3x3cm; no d/c or bleeding from lesion; lesion itself is similar to a picked scab, no underlying knot or area of fluctuence; TTP similar lesion on top of right breast, 4x2cm, also with surrounding hyperpigmentation, also non-infected appearing, but not TTP no bumps or masses, no s/s of candidal infection; no breast erythema, warmth or swelling noted. no abnormalities of the nipples, no nipple discharge other hyperpigmented areas of scarring on bilateral breasts, w/o overlying lesions.    Impression & Recommendations:  Problem # 1:  BREAST PAIN, LEFT (ICD-611.71) Two different types of pain: 1. Pain under left breast is more likely due to pendulous breasts; will start tylenol 650mg  at bedtime x1 week as well as recommend lifestyle changes, weight loss, and wearing two sports bras or sports bra with tank top w/ shelving. 2. Pain on top of breast seems to be more topical pain, likely 2/2 the lesion.  Does not appear infected, no signs of cellulitis, no d/c, no erythema.  Will give bacitracin topical ointment, apply to areas 3 times per day and cover  with bandaid to keep areas moist and clean. Will have pt f/u for skin recheck and to reassess breast pain. Could consider sending to breast surgeon if pain continues for suggestions.   Diagnostic mammogram RIGHT BREAST 12/24/09: area of density with several adjacent amorphous calcifications located laterally w/in the right breast at approx 9-10 o'clock position likely represents area of evolving far necrosis; f/u mamo and U/S in 6 months. Orders: [facility]- Est  Level 4 ([id number])  Problem # 2:  UNSPEC LOCAL INFECTION SKIN&SUBCUTANEOUS TISSUE (ICD-686.9) See above (#2) Orders: [facility]- Est  Level 4 ([id number])  Problem # 3:  HYPERTENSION,  BENIGN SYSTEMIC (ICD-401.1) BP ok today- 152/83, pulse 58.  Could consider restarting chlorthalidone if necessary but will hold off at this time since pt is still having breast pain. No CP, SOB, edema, HA's or vision changes.  Will continue to monitor. Pt to RTC 2 weeks and BP will be rechecked. Orders: [facility]- Est  Level 4 ([id number])  Her updated medication list for this problem includes:    Metoprolol Tartrate 100 Mg Tabs (Metoprolol tartrate) .Marland Kitchen... 1 tab by mouth two times a day for high blood pressure  Problem # 4:  OBESITY, NOS (ICD-278.00) Likely contributing at least partially to pt's breast pain.  Pt with amenorrhea and hirsuitism.  Will check TSH per pt request.  Orders: TSH-[facility] ([id number]) [facility]- Est  Level 4 (46962)  Problem # 5:  UNSPECIFIED ANEMIA (ICD-285.9) Check CBC to evaluate.   Orders: CBC-[facility] ([id number]) [facility]- Est  Level 4 (13244)  Problem # 6:  HIRSUTISM (ICD-704.1) Will check free testosterone.  Pt also with h/o ammenorrhea since birth of her 55 year old.  Paps have been WNL (nml 11/2007).  Will monitor.   Orders: Testosterone-[facility] ([id number]) [facility]- Est  Level 4 (44034)  Complete Medication List: 1)  Metoprolol Tartrate 100 Mg Tabs (Metoprolol tartrate) .Marland Kitchen.. 1 tab by mouth two times a day for high blood pressure 2)  Flonase 50 Mcg/act Susp (Fluticasone propionate) .Marland Kitchen.. 1 spray in each nostril two times a day 3)  Bacitracin 500 Unit/gm Oint (Bacitracin) .... Apply to affected areas three times a day  Patient Instructions: 1)  I'm so sorry you are still having pain! 2)  Come back and see me in 2 weeks so we can check on those spots on your breast 3)  Put the anibiotic cream on the open areas on your breasts three times a day until you see me again.  Try to keep these areas covered with bandaids or gauze and tape to help keep them clean and moist. 4)  Take Tylenol 650mg  at bedtime every night for the next week to see if it helps with the pain. 5)  We are checking some  labs today.  I will let you know if there is anything abnormal when I see you in a couple of weeks. 6)  Have a great Christmas and I will see you in 2012! Prescriptions: BACITRACIN 500 UNIT/GM OINT (BACITRACIN) apply to affected areas three times a day  #1 tube x 0   Entered and Authorized by:   Demetria Pore MD   Signed by:   Demetria Pore MD on 02/19/2010   Method used:   Print then Give to Patient   RxID:   [id number] BACITRACIN 500 UNIT/GM OINT (BACITRACIN) apply to affected areas three times a day  #1 tube x 0   Entered and Authorized by:   Demetria Pore MD   Signed by:   Demetria Pore MD  on 02/19/2010   Method used:   [facility] to        [facility]       [address]       [place], [place]  [address]       Ph: [phone]       Fax: [phone]   RxID:   [id number]    Orders Added: 1)  CBC-[facility] [[id number]] 2)  TSH-[facility] [[id number]] 3)  Testosterone-[facility] [[id number]] 4)  [facility]- Est  Level 4 [34742]

## 2010-04-09 NOTE — Assessment & Plan Note (Signed)
Summary: L MIDDLE FINGER LOCKING,MC   Vital Signs:  Patient profile:   55 year old female BP sitting:   182 / 88  Vitals Entered By: Lillia Pauls CMA (April 01, 2010 2:05 PM)  Primary Provider:  Demetria Pore MD  CC:  L 3rd finger locking.  History of Present Illness: 54yo R-hand dominant female to office with c/o locking left 3rd finger.  Symptoms have been on-going for 3-weeks.  Associated tender nodule in palm of her hand.  Was evaluated for symptoms last week at [facility] by Dr. Denyse Amass & underwent trigger finger steroid injection.  Injection helped some - has noted decreased locking, but still having some pain along 3rd finger.  Taking ibuprofen as needed.  Also underwent underwent steroid injection 07/2009 by Dr. Fredric Mare in our office which was helpful.  Denies hx of DM, thyroid disease, or inflammatory disease.  Denies numbness/tingling, denies weakness.       Allergies: 1)  ! Ace Inhibitors 2)  ! Hydrochlorothiazide  Past History:  Past Medical History: Last updated: 12/27/2009 h/o PUD `91 - UGI Series - 03/02/1989  hypertension abnormal mammogram  Past Surgical History: Last updated: 12/27/2009 c-section UGI Series - 03/02/1989  Family History: Last updated: 12/27/2009 Dad: Diabetes 1st degree, hypertension maternal aunts with breast and ovarian ca No known CAD or CVA  No history of dialysis Mother died with bladder cancer.  Brother and sister have HTn  Social History: Last updated: 01/31/2010 Smokes 10-12cigs/day, depends on how stressed she is. Denies recreational drugs and EtOH. Livew with her daughter and 3 grandchildren. Not currently working    Review of Systems       per HPI  Physical Exam  General:  Well-developed,well-nourished,in no acute distress; alert,appropriate and cooperative throughout examination Msk:  HANDS: - L hand: full ROM of all fingers without any triggering.  Small 1-2 mm nodule noted along A1 pulley of 3rd finger, mild TTP with firm  palpation.  No swelling, erythema, bruising.  Normal grip strength. - R hand: FROM of all finger without pain, swelling, weakness, or triggering.  No palpable nodules   Pulses:  +2/4 radial b/l Neurologic:  sensation intact to light touch.     Impression & Recommendations:  Problem # 1:  TRIGGER FINGER, LEFT MIDDLE (ICD-727.03) Assessment Improved - s/p steroid injection 1-week ago, seems to be improving.  No locking on exam today.   - Unable to do additional injection today b/c recent injection 1-week ago.  This was her second injection.  Explained next option would be referral for surgical evaluation.  Not interested in surgery at this time. - Cont. ibuprofen as needed - May start gentle stretching/ROM exercises - f/u with [place] or [facility] as needed  Problem # 2:  HYPERTENSION, BENIGN SYSTEMIC (ICD-401.1) - Elevated BP noted today - Should f/u with PCP regarding this within next 1-3 days, cont to take medication as prescribed.  Should eat low salt diet.  Her updated medication list for this problem includes:    Metoprolol Tartrate 100 Mg Tabs (Metoprolol tartrate) .Marland Kitchen... 1 tab by mouth two times a day for high blood pressure  Complete Medication List: 1)  Metoprolol Tartrate 100 Mg Tabs (Metoprolol tartrate) .Marland Kitchen.. 1 tab by mouth two times a day for high blood pressure 2)  Flonase 50 Mcg/act Susp (Fluticasone propionate) .Marland Kitchen.. 1 spray in each nostril two times a day 3)  Bacitracin 500 Unit/gm Oint (Bacitracin) .... Apply to affected areas three times a day   Orders Added: 1)  Est. Patient Level III [id number]

## 2010-04-10 ENCOUNTER — Ambulatory Visit (INDEPENDENT_AMBULATORY_CARE_PROVIDER_SITE_OTHER): Payer: Self-pay | Admitting: Family Medicine

## 2010-04-10 ENCOUNTER — Encounter: Payer: Self-pay | Admitting: Family Medicine

## 2010-04-10 DIAGNOSIS — E669 Obesity, unspecified: Secondary | ICD-10-CM

## 2010-04-10 DIAGNOSIS — I10 Essential (primary) hypertension: Secondary | ICD-10-CM

## 2010-04-10 DIAGNOSIS — M6530 Trigger finger, unspecified finger: Secondary | ICD-10-CM

## 2010-04-10 DIAGNOSIS — E785 Hyperlipidemia, unspecified: Secondary | ICD-10-CM

## 2010-04-10 NOTE — Assessment & Plan Note (Addendum)
Locking continues. D/w Dr. Leveda Anna, who agreed with waiting on another injection.  Will get referral to hand surgeon, although will likely be a long wait since pt does not have insurance.  Will need pt to apply for [place] [employer] card first. Renato Battles will call and explain this to her.  Then we will put referral in once she has been approved. Could consider repeat injection 3 months after last injection (so ~72mo from now). This would be 3rd injection since June 2011. Pt does have relief from injections, but surgery will be the most beneficial, long-term solution.  Encouraged heat and ibuprofen for pain and well as gentle stretching/ROM.

## 2010-04-10 NOTE — Assessment & Plan Note (Signed)
Pt meeting w/ nutritionist this month. Weight relatively stable at 319# Encouraged starting mild exercise regimen of walking 15-30 min 3x/wk.

## 2010-04-10 NOTE — Assessment & Plan Note (Signed)
BP elevated at this visit.  Acceptable at this time. Counseled patient on the importance of improving this.  She understands and agrees.  Would like to try diet and exercise before adding another medication.  Meeting with Dr. Gerilyn Pilgrim in a few weeks. STRONGLY encouraged her to start trying to walk 15-30 min 3x/wk so that exercise becomes routine. She stated understanding and agreement.  Will f/u in 1 mo for BP recheck.

## 2010-04-10 NOTE — Progress Notes (Signed)
  Subjective:    Patient ID: Tammy [name], female    DOB: 1955/04/15, 55 y.o.   MRN: [id number]  HPI Comments: Trigger finger: Pt still having pain w/ left middle finger.  Had injection on 1/24 with moderate improvement-- pain has gotten better but still locks when she tried to bend on straighten just that finger; it is ok when she moves all of her fingers together.  Went to [facility] 1/31 and Dr. Christella Hartigan said it is too soon for another injection as it had only been 1 week; rec surgery referral being the next step. Pt not open to it at that time, but is now interested.  Ibuprofen and warm water/heat make pain feel better.  Pain is bearable, but the locking is what is most bothersome.   HTN: 150/86 today, HR 64; on only metoprolol at this time, wt stable at 319 (319 1/21, 321 12/21, 316 12/13). Seeing nutritionist the end of this month. States she is very motivated to make lifestyle changes. Says she has been eating a lot of salt lately; had a pork chop just before coming in today. Is under a lot of stress (living with daughter and her 3 children at this time). Having fatigue and occ headaches, but no CP, SOB, LEE.  Continues smoking 1/2-1ppd. Not interested in cutting down or quitting b/c she is very stressed.   Hypertension This is a chronic problem. The current episode started more than 1 year ago. The problem is unchanged. The problem is uncontrolled. Associated symptoms include headaches and malaise/fatigue. Pertinent negatives include no anxiety, blurred vision, neck pain, orthopnea, palpitations, peripheral edema or shortness of breath. There are no associated agents to hypertension. Risk factors for coronary artery disease include dyslipidemia, obesity, post-menopausal state, smoking/tobacco exposure, sedentary lifestyle and stress. Past treatments include beta blockers. The current treatment provides mild improvement. Compliance problems include diet, exercise and medication side effects.  There  is no history of angina, kidney disease, CAD/MI, CVA, heart failure, left ventricular hypertrophy, PVD or a thyroid problem. There is no history of chronic renal disease, hypercortisolism or a hypertension causing med.      Review of Systems  Constitutional: Positive for malaise/fatigue.  HENT: Negative for neck pain.   Eyes: Negative for blurred vision.  Respiratory: Negative for shortness of breath.   Cardiovascular: Negative for palpitations and orthopnea.  Neurological: Positive for headaches.       Objective:   Physical Exam  Constitutional: She appears well-developed and well-nourished.  HENT:  Head: Normocephalic and atraumatic.  Eyes: EOM are normal. Pupils are equal, round, and reactive to light.  Cardiovascular: Normal rate and regular rhythm.   No murmur heard. Pulmonary/Chest: Effort normal and breath sounds normal. She has no wheezes.  Musculoskeletal:       Left hand: She exhibits normal range of motion, no tenderness, no bony tenderness, no deformity and no swelling. normal sensation noted.       Hands:         Assessment & Plan:  See problem list.

## 2010-04-10 NOTE — Patient Instructions (Addendum)
I'm so sorry for the wait today. I really do apologize. Thank you for being so patient. I agree with the Sports Medicine doctor- I think surgery will be your best long term option. But, if you really don't want surgery we can talk about doing another injection in 2-3 months. Continue the ibuprofen if you have pain. Remember to keep your nutrition appointment! I think that this will be our best bet on getting your blood pressure down without needing to add new medicines. Try to start getting into an exercise routine-- consider walking 15-30 minutes 3 times per week to start, then when you meet with Dr. Gerilyn Pilgrim you can add the diet component. I know that things are very stressful, but try to cut down on smoking if you can! Even if it's just 1 or 2 less cigarettes per day!

## 2010-04-17 ENCOUNTER — Encounter: Payer: Self-pay | Admitting: *Deleted

## 2010-04-17 NOTE — Assessment & Plan Note (Signed)
Summary: f/u,df   Allergies: 1)  ! Ace Inhibitors 2)  ! Hydrochlorothiazide   Complete Medication List: 1)  Metoprolol Tartrate 100 Mg Tabs (Metoprolol tartrate) .Marland Kitchen.. 1 tab by mouth two times a day for high blood pressure 2)  Flonase 50 Mcg/act Susp (Fluticasone propionate) .Marland Kitchen.. 1 spray in each nostril two times a day 3)  Bacitracin 500 Unit/gm Oint (Bacitracin) .... Apply to affected areas three times a day

## 2010-04-24 ENCOUNTER — Ambulatory Visit: Payer: Self-pay | Admitting: Family Medicine

## 2010-05-01 ENCOUNTER — Ambulatory Visit (INDEPENDENT_AMBULATORY_CARE_PROVIDER_SITE_OTHER): Payer: Self-pay | Admitting: Family Medicine

## 2010-05-01 DIAGNOSIS — E785 Hyperlipidemia, unspecified: Secondary | ICD-10-CM

## 2010-05-01 DIAGNOSIS — E669 Obesity, unspecified: Secondary | ICD-10-CM

## 2010-05-01 NOTE — Patient Instructions (Addendum)
-   Eat at least 3 meals and 1-2 snacks per day.  Aim for no more than 5 hours between eating.  If you don't want to eat a whole meal at lunchtime, then at least have some fruit and/or yogurt or cottage cheese.   - Increase vegetables; aim for at least one serving of vegetables every day!  Keep in mind that taste preferences are learned.   - Try to keep healthy snacks on hand, and limit junky snack foods.   - Physical activity goal:  Walk at least 15 minutes 3 X wk.  Record this on a calendar.  Explore the possibility of getting a friend to walk with you.   - The 5-minute rule:  Promise yourself you will do at least 5 minutes, and if at the end of 5 minutes, you want to quit, you can.   - Please ask your daughter to come with you to your follow-up appt so she can understand how to better help you make better nutritional choices.   - At your next appt, we will talk about better choices for beverages!

## 2010-05-01 NOTE — Progress Notes (Signed)
Medical Nutrition Therapy:  Appt start time: 1130 end time:  1230.  Assessment:  Primary concerns today: Weight management and hyperlipidemia. Tammy [name] lives with her daughter and grandchildren, ages 2, 96, & 91.  Her daughter does most of the food preparation, and Tammy [name] said her daughter is not nutrition-conscious.     Eating pattern is erratic, but Tammy [name] usually eats breakfast (w/ BP med), and usually skips lunch.  Avoided foods used to be veg's, but she is making an effort to include them in her diet now, having been hospitalized with low potassium levels.  Getting veg's 3-4 X wk currently.  Everyday foods include potatoes (usually ff's) and soda (20 oz 3-4 X wk).   Physical activity includes walking ~15 min 2 X wk.  24-hr recall suggests intake of 2200-2300 kcal: B (9 AM)- steak & egg biscuit, 20 oz sweet tea; L ( PM)- 2(+) c spaghetti w/ meat sauce, 20 oz soda; Snk (3 PM)- small bag of potato chips, 20 oz Sprite; D ( PM)- 1 slc pepperoni pizza, 2 chx wings, 20 oz orange soda.  Breakfast is usually prepared at home, i.e., bacon, eggs, grits/oatmeal, and soda intake was more than usual yesterday.   Progress Towards Goal(s):  In progress.   Intervention:  Nutrition counseling.  Nutritional Diagnosis:  NB-2.1 Physical inactivity As related to poor motivation.  As evidenced by no more than 30 minutes wlking per week.  . NI-5.6.2 Excessive fat intake As related to takeout and snack foods.  As evidenced by yesterday's intake of fast food for two meals. .    Monitoring/Evaluation:  Dietary intake in 4 week(s).

## 2010-05-07 ENCOUNTER — Ambulatory Visit (INDEPENDENT_AMBULATORY_CARE_PROVIDER_SITE_OTHER): Payer: Self-pay | Admitting: Family Medicine

## 2010-05-07 ENCOUNTER — Encounter: Payer: Self-pay | Admitting: Family Medicine

## 2010-05-07 VITALS — BP 131/75 | HR 51 | Temp 98.2°F | Ht 67.0 in | Wt 333.6 lb

## 2010-05-07 DIAGNOSIS — E669 Obesity, unspecified: Secondary | ICD-10-CM

## 2010-05-07 DIAGNOSIS — F17200 Nicotine dependence, unspecified, uncomplicated: Secondary | ICD-10-CM

## 2010-05-07 DIAGNOSIS — M6530 Trigger finger, unspecified finger: Secondary | ICD-10-CM

## 2010-05-07 DIAGNOSIS — I10 Essential (primary) hypertension: Secondary | ICD-10-CM

## 2010-05-07 DIAGNOSIS — R079 Chest pain, unspecified: Secondary | ICD-10-CM

## 2010-05-07 MED ORDER — METOPROLOL TARTRATE 100 MG PO TABS: 100.0000 mg | ORAL_TABLET | Freq: Two times a day (BID) | ORAL | Status: DC

## 2010-05-07 NOTE — Patient Instructions (Signed)
Try to get out walking! I'm sure your grandkids would benefit from it also.  It's starting to get nice out! I think it's great your daughter is coming with you to your next appt with Dr. Gerilyn Pilgrim! I am sending your blood pressure medicine to [facility].  I'm so sorry your hand is still bothering you! I am making a referral to a hand surgeon today.  We will let you know once we hear back from them and hopefully we can get you seen sooner rather than later.   Continue taking Ibuprofen or Tylenol as needed for pain.  You can also use heat or ice to help with the joint pain.

## 2010-05-08 ENCOUNTER — Encounter: Payer: Self-pay | Admitting: Family Medicine

## 2010-05-08 DIAGNOSIS — R079 Chest pain, unspecified: Secondary | ICD-10-CM | POA: Insufficient documentation

## 2010-05-08 NOTE — Assessment & Plan Note (Signed)
Locking becoming worse, now with pain in her ring finger as well.   Referral to hand surgeon made, although will likely be a long wait since pt does not have insurance.   Could consider repeat injection 3 months after last injection (so ~25mo from now). This would be 3rd injection since June 2011. Pt does have relief from injections, but surgery will be the most beneficial, long-term solution.  Encouraged heat and ibuprofen for pain and well as gentle stretching/ROM.

## 2010-05-08 NOTE — Assessment & Plan Note (Signed)
Pt w/ CP a few days ago for 5-10 min while sitting and talking on the phone. Went away after taking ibuprofen.  Ddx includes cardiac/angina vs stress/anxiety vs MSK vs GERD -Pain was tight, did not radiate, no associated sweating, nausea, SOB making cardiac etiology less likely.  Could be unstable angina, pt does have risk factor of HTN, obesity, but cardiac etiology less likely than anxiety. -Stress/anxiety- pt endorses a high stress level and was on the phone talking when it happened; did not think it was particularly stressful day -MSK- non reproducible, less likely  -GERD- pt w/o other s/s of reflux so less likely at this time  -Pt given red flags to go to ED for including SOB, radiating pain, sweating, nausea -BP under control today, will monitor for further CP.  -Could consider EKG if occurs again

## 2010-05-08 NOTE — Assessment & Plan Note (Signed)
BP improved today (131/75; was 150/86 on 04/10/10). Does not ever miss or forget to take her medications.  Is trying to also improve BP by diet and exercise. Will continue to follow.

## 2010-05-08 NOTE — Assessment & Plan Note (Signed)
Pt knows she should stop, but has too much stress in her life to cut down or quit at this time.

## 2010-05-08 NOTE — Progress Notes (Signed)
  Subjective:    Patient ID: Tammy [name], female    DOB: Dec 28, 1955, 55 y.o.   MRN: [id number]  Hypertension This is a chronic problem. The problem has been gradually improving since onset. The problem is controlled. Associated symptoms include anxiety and chest pain. Pertinent negatives include no blurred vision, headaches, palpitations, peripheral edema, shortness of breath or sweats. Risk factors for coronary artery disease include stress, smoking/tobacco exposure, sedentary lifestyle, obesity and dyslipidemia. Past treatments include beta blockers. The current treatment provides moderate improvement. Compliance problems include diet, exercise and psychosocial issues.  There is no history of angina, kidney disease, CAD/MI, CVA, heart failure, retinopathy or a thyroid problem.  Chest Pain  This is a new problem. The current episode started in the past 7 days. The onset quality is sudden. The problem occurs rarely. The problem has been resolved. The pain is present in the substernal region and epigastric region. The pain is at a severity of 10/10. The pain is severe. The quality of the pain is described as sharp and tightness (felt like a knot was in her chest). The pain does not radiate. Pertinent negatives include no abdominal pain, back pain, cough, diaphoresis, dizziness, exertional chest pressure, fever, headaches, irregular heartbeat, lower extremity edema, nausea, near-syncope, numbness, palpitations, shortness of breath, syncope or vomiting. The pain is aggravated by nothing. She has tried analgesics for the symptoms. The treatment provided significant relief. Risk factors include smoking/tobacco exposure, stress, obesity and lack of exercise.  Her past medical history is significant for anxiety/panic attacks, hyperlipidemia and hypertension.  Pertinent negatives for past medical history include no arrhythmia, no CAD, no cancer, no congenital heart disease, no CHF, no diabetes, no DVT, no MI, no  PE, no strokes, no thyroid problem and no valve disorder.      Review of Systems  Constitutional: Negative for fever and diaphoresis.  Eyes: Negative for blurred vision.  Respiratory: Negative for cough and shortness of breath.   Cardiovascular: Positive for chest pain. Negative for palpitations, syncope and near-syncope.  Gastrointestinal: Negative for nausea, vomiting and abdominal pain.  Musculoskeletal: Negative for back pain.  Neurological: Negative for dizziness, numbness and headaches.       Objective:   Physical Exam  Constitutional: She appears well-developed and well-nourished. No distress.  Neck: Normal range of motion. Neck supple. No JVD present.  Cardiovascular: Normal rate, regular rhythm, normal heart sounds and intact distal pulses.   No murmur heard. Pulmonary/Chest: Effort normal and breath sounds normal. No respiratory distress. She has no wheezes. She exhibits no tenderness.  Abdominal: Soft. Bowel sounds are normal. There is no tenderness.  Musculoskeletal:       Hands: Skin: She is not diaphoretic.          Assessment & Plan:

## 2010-05-08 NOTE — Assessment & Plan Note (Signed)
Pt met w/ Dr. Gerilyn Pilgrim earlier this month.  She is bringing her daughter w/ her to next nutrition appt since she is the main food shopper.  She is also planning to start walking 3x per week, but has not started since the weather has not been warm yet.  When asked why not today or this week with the nice weather, she says b/c she is watching her almost 54yo grand-daughter.  I suggested taking her with her on the walk, she seemed amenable and may try it tomorrow.

## 2010-05-14 ENCOUNTER — Telehealth: Payer: Self-pay | Admitting: Family Medicine

## 2010-05-14 LAB — DIFFERENTIAL
Basophils Absolute: 0 10*3/uL (ref 0.0–0.1)
Basophils Relative: 0 % (ref 0–1)
Eosinophils Absolute: 0.1 10*3/uL (ref 0.0–0.7)
Eosinophils Relative: 2 % (ref 0–5)
Lymphocytes Relative: 44 % (ref 12–46)
Lymphs Abs: 2.1 10*3/uL (ref 0.7–4.0)
Monocytes Absolute: 0.4 10*3/uL (ref 0.1–1.0)
Monocytes Relative: 9 % (ref 3–12)
Neutro Abs: 2.2 10*3/uL (ref 1.7–7.7)
Neutrophils Relative %: 45 % (ref 43–77)

## 2010-05-14 LAB — LIPID PANEL
Cholesterol: 225 mg/dL — ABNORMAL HIGH (ref 0–200)
LDL Cholesterol: 164 mg/dL — ABNORMAL HIGH (ref 0–99)
Triglycerides: 70 mg/dL (ref <150)

## 2010-05-14 LAB — CBC
HCT: 34.7 % — ABNORMAL LOW (ref 36.0–46.0)
Hemoglobin: 11.7 g/dL — ABNORMAL LOW (ref 12.0–15.0)
Hemoglobin: 11.8 g/dL — ABNORMAL LOW (ref 12.0–15.0)
MCH: 27.6 pg (ref 26.0–34.0)
MCH: 27.7 pg (ref 26.0–34.0)
MCHC: 33.7 g/dL (ref 30.0–36.0)
MCV: 81.5 fL (ref 78.0–100.0)
MCV: 82 fL (ref 78.0–100.0)
Platelets: 197 10*3/uL (ref 150–400)
RBC: 4.23 MIL/uL (ref 3.87–5.11)
RBC: 4.28 MIL/uL (ref 3.87–5.11)
RDW: 12.9 % (ref 11.5–15.5)
WBC: 4.8 10*3/uL (ref 4.0–10.5)

## 2010-05-14 LAB — BASIC METABOLIC PANEL
BUN: 18 mg/dL (ref 6–23)
CO2: 20 mEq/L (ref 19–32)
CO2: 23 mEq/L (ref 19–32)
Calcium: 8.8 mg/dL (ref 8.4–10.5)
Chloride: 107 mEq/L (ref 96–112)
Chloride: 108 mEq/L (ref 96–112)
Creatinine, Ser: 0.94 mg/dL (ref 0.4–1.2)
GFR calc Af Amer: >60 mL/min (ref >60)
GFR calc non Af Amer: >60 mL/min (ref >60)
Glucose, Bld: 114 mg/dL — ABNORMAL HIGH (ref 70–99)
Glucose, Bld: 169 mg/dL — ABNORMAL HIGH (ref 70–99)
Potassium: 3.2 mEq/L — ABNORMAL LOW (ref 3.5–5.1)
Potassium: 3.7 mEq/L (ref 3.5–5.1)
Sodium: 136 mEq/L (ref 135–145)
Sodium: 138 mEq/L (ref 135–145)

## 2010-05-14 LAB — CARDIAC PANEL(CRET KIN+CKTOT+MB+TROPI)
CK, MB: 1.4 ng/mL (ref 0.3–4.0)
Relative Index: 0.8 (ref 0.0–2.5)
Relative Index: 1 (ref 0.0–2.5)
Troponin I: <0.01 ng/mL (ref 0.00–0.06)

## 2010-05-14 LAB — POCT CARDIAC MARKERS
CKMB, poc: <1 ng/mL — ABNORMAL LOW (ref 1.0–8.0)
Myoglobin, poc: 99.3 ng/mL (ref 12–200)
Troponin i, poc: <0.05 ng/mL (ref 0.00–0.09)

## 2010-05-14 NOTE — Telephone Encounter (Signed)
LMOVM again.  The only reason I called pt was to inform of appt. If she calls back please give her this info:  Appt made to [facility] [address] for 3.21.12 @8 :30am  I also mailed her an appt reminder. Fleeger, Maryjo [place]

## 2010-05-14 NOTE — Telephone Encounter (Signed)
Pt is returning Jessica's call

## 2010-05-20 ENCOUNTER — Encounter: Payer: Self-pay | Admitting: Family Medicine

## 2010-05-21 ENCOUNTER — Telehealth: Payer: Self-pay | Admitting: Family Medicine

## 2010-05-21 ENCOUNTER — Other Ambulatory Visit: Payer: Self-pay | Admitting: Family Medicine

## 2010-05-21 DIAGNOSIS — Z09 Encounter for follow-up examination after completed treatment for conditions other than malignant neoplasm: Secondary | ICD-10-CM

## 2010-05-21 NOTE — Telephone Encounter (Signed)
Patient dropped off paper that needs to be filled out for surgery.  She needs this faxed back asap so surgery can be done next Friday.

## 2010-05-25 NOTE — Telephone Encounter (Signed)
I filled it out and faxed it yesterday (Friday, 3/23) at about 7:30p.  I put it in the "faxed" pile with the confirmation that the fax went through stapled to it.

## 2010-05-29 ENCOUNTER — Ambulatory Visit: Payer: Self-pay | Admitting: Family Medicine

## 2010-05-29 ENCOUNTER — Encounter (HOSPITAL_BASED_OUTPATIENT_CLINIC_OR_DEPARTMENT_OTHER)
Admission: RE | Admit: 2010-05-29 | Discharge: 2010-05-29 | Disposition: A | Discharge status: Home / Self Care | Payer: Self-pay | Source: Ambulatory Visit | Attending: Orthopedic Surgery | Admitting: Orthopedic Surgery

## 2010-05-29 DIAGNOSIS — Z01812 Encounter for preprocedural laboratory examination: Secondary | ICD-10-CM | POA: Insufficient documentation

## 2010-05-29 LAB — BASIC METABOLIC PANEL
Chloride: 107 mEq/L (ref 96–112)
Creatinine, Ser: 0.85 mg/dL (ref 0.4–1.2)
GFR calc Af Amer: >60 mL/min (ref >60)
Potassium: 4.2 mEq/L (ref 3.5–5.1)

## 2010-05-30 ENCOUNTER — Ambulatory Visit (HOSPITAL_BASED_OUTPATIENT_CLINIC_OR_DEPARTMENT_OTHER)
Admission: RE | Admit: 2010-05-30 | Discharge: 2010-05-30 | Disposition: A | Discharge status: Home / Self Care | Payer: Self-pay | Source: Ambulatory Visit | Attending: Orthopedic Surgery | Admitting: Orthopedic Surgery

## 2010-05-30 DIAGNOSIS — I10 Essential (primary) hypertension: Secondary | ICD-10-CM | POA: Insufficient documentation

## 2010-05-30 DIAGNOSIS — F17200 Nicotine dependence, unspecified, uncomplicated: Secondary | ICD-10-CM | POA: Insufficient documentation

## 2010-05-30 DIAGNOSIS — M6530 Trigger finger, unspecified finger: Secondary | ICD-10-CM | POA: Insufficient documentation

## 2010-05-30 DIAGNOSIS — Z01812 Encounter for preprocedural laboratory examination: Secondary | ICD-10-CM | POA: Insufficient documentation

## 2010-05-30 LAB — POCT HEMOGLOBIN-HEMACUE: Hemoglobin: 11.9 g/dL — ABNORMAL LOW (ref 12.0–15.0)

## 2010-06-05 ENCOUNTER — Encounter: Payer: Self-pay | Admitting: Family Medicine

## 2010-06-05 ENCOUNTER — Ambulatory Visit (INDEPENDENT_AMBULATORY_CARE_PROVIDER_SITE_OTHER): Payer: Self-pay | Admitting: Family Medicine

## 2010-06-05 VITALS — BP 155/72 | HR 56 | Temp 98.1°F | Ht 67.0 in | Wt 329.0 lb

## 2010-06-05 DIAGNOSIS — I10 Essential (primary) hypertension: Secondary | ICD-10-CM

## 2010-06-05 DIAGNOSIS — E669 Obesity, unspecified: Secondary | ICD-10-CM

## 2010-06-05 DIAGNOSIS — F17200 Nicotine dependence, unspecified, uncomplicated: Secondary | ICD-10-CM

## 2010-06-05 NOTE — Assessment & Plan Note (Signed)
Continues smoking ~ 1/2ppd, but has decreased amount during the day b/c of work, but compensates by smoking more at night when she is home.  Not interested in quitting as her daughter and the rest of her family all smoke and she does not feel she has enough support.  Mentioned smoking cessation w/ Dr. Raymondo Band. Pt not interested at this time. Will continue discussing at future visits.

## 2010-06-05 NOTE — Progress Notes (Signed)
  Subjective:    Patient ID: Tammy [name], female    DOB: [birth year], 55 y.o.   MRN: [id number]  HPI Comments: Pt comes in today for BP check.  Initial read was 187/82, recheck was 155/72. Pt denies missing any medication.  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 Has not been able to make any dietary or exercise changes.  States she is too tired to walk after work and just crawls into bed.  She does not think she would be able to walk in the morning before work.    Continues to smoke ~1/2ppd, although now can only smoke 1-2 cigarettes during the day because of her job, but "makes up for it" at night.  Not interested in discussing cessation at this time; does not think she has a support network for quitting since her daughter and all of her family smoke.  Had release of trigger finger late last month, has f/u to get stitches out 4/11.  Feels like it has helped the pain some.    Hypertension Pertinent negatives include no blurred vision, chest pain, headaches, orthopnea, palpitations, peripheral edema, PND or shortness of breath.      Review of Systems  Constitutional: Positive for fatigue.  Eyes: Negative for blurred vision.  Respiratory: Negative for chest tightness and shortness of breath.   Cardiovascular: Negative for chest pain, palpitations, orthopnea, leg swelling and PND.  Neurological: Negative for headaches.       Objective:   Physical Exam  Constitutional: She appears well-developed and well-nourished. No distress.  Cardiovascular: Normal rate, regular rhythm, normal heart sounds and intact distal pulses.   No murmur heard. Pulmonary/Chest: Effort normal and breath sounds normal. No respiratory distress. She has no wheezes. She has no rales.  Abdominal: Soft. Bowel sounds are normal. There is no tenderness.          Assessment & Plan:

## 2010-06-05 NOTE — Assessment & Plan Note (Signed)
Appears to be unmotivated to make any lifestyle changes at this time.  Has appt w/ nutrition later this month.  Will follow.

## 2010-06-05 NOTE — Assessment & Plan Note (Signed)
Pt w/ elevated BP today: 187/82 --> 155/72 on manual recheck with appropriate size cuff. Pt states she is not missing med doses ever. Has not been motivated to start exercising or making diet changes. States that walking right now is "impossible" because of her new job where she works 9-6; she is too exhausted when she comes home from work to walk and cannot get up early enough in the morning to walk before work. Could maybe walk on Sat and Sun, but thinks she will be able to start walking more in "a couple of months" once she is used to working again.   No red flags. No med changes at this time, as pt's HR (56) will not allow increase in metoprolol and pt is skeptical about trying new BP meds.  Will continue encouraging lifestyle changes including dietary improvements and regular exercise.  F/u in 1 month for BP recheck.

## 2010-06-17 ENCOUNTER — Ambulatory Visit (INDEPENDENT_AMBULATORY_CARE_PROVIDER_SITE_OTHER): Payer: Self-pay | Admitting: Family Medicine

## 2010-06-17 DIAGNOSIS — E669 Obesity, unspecified: Secondary | ICD-10-CM

## 2010-06-17 DIAGNOSIS — E785 Hyperlipidemia, unspecified: Secondary | ICD-10-CM

## 2010-06-17 NOTE — Patient Instructions (Addendum)
-   Speak with someone at [facility] ([address]; [phone]):  What are the options?   - Call Dr. Zenaida Niece about your granddaughter's precocious puberty. - Goal:  Take your 54-year-old granddaughter for a walk, aiming for at least 15 minutes at a time.   - Goal:  Take the children to the park to PLAY on Saturdays.   - Other efforts:  Buy some frozen veg's, and include veg's at least once a day.  Use your microwave to prepare veg's fast and conveniently. Try to have fruit and apple sauce on hand for snacks (preferably in limited quantities).  Also - limit beverage intake to no more than 1 soda or fruit juice per day.   - Call Dr. Gerilyn Pilgrim if you need to change your appt:  [phone].

## 2010-06-17 NOTE — Progress Notes (Signed)
Medical Nutrition Therapy:  Appt start time: 9000 end time:  1000.  Assessment:  Primary concerns today: Weight management and hyperlipidemia. Tammy [name] is very distressed that her daughter, w/ whom Tammy [name] lives, has a drug problem.  She feels her daughter is not parenting well in general, and in particular is not providing good (or even adequate) nutrition for her children (ages 59, 80, & 3, all of whom are overweight/obese).  She said the children have not had a real meal since Sunday, i.e., cereal for dinner last night at 8 PM.  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  She now has no money to buy veg's for herself or for the grandchildren.  They rely on food purchases of Tammy [name]'s daughter, who gets food stamps.  Clearly, now is a difficult time for Tammy [name] to make challenging lifestyle changes as she deals with significant stresses in her life.  We talked about her own self-care as essential to ultimately being able to help her grandchildren, however.  Surprisingly, she said she would like to keep trying, and wants to make a follow-up nutrition appt.  While she is afraid that Child Protective Services may take her grandchildren, and that they will not have a place to live if her daughter's drug problem is properly addressed, she is going to try to find out the likely outcome of a drug intervention for her daughter.    Progress Towards Goal(s):  In progress.   Intervention:  Nutrition counseling.  Nutritional Diagnosis:  NB-2.1 Physical inactivity as related to high stress levels as evidenced by little or no walking in the past month.  NI-5.6.2  Excessive fat intake as related to takeout and snack foods as evidenced by patient report of no meaningful changes in dietary choices.      Monitoring/Evaluation:  Dietary intake in 3 weeks.

## 2010-06-20 NOTE — Op Note (Signed)
NAMETEAGEN, BUCIO              ACCOUNT NO.:  [id number]  MEDICAL RECORD NO.:  [id number]           PATIENT TYPE:  LOCATION:                                 FACILITY:  PHYSICIAN:  Toni Arthurs, MD        DATE OF BIRTH:  1956/01/18  DATE OF PROCEDURE:  05/30/2010 DATE OF DISCHARGE:                              OPERATIVE REPORT   PREOPERATIVE DIAGNOSIS:  Left long finger trigger finger.  POSTOPERATIVE DIAGNOSIS:  Left long finger trigger finger.  PROCEDURE:  Left long finger trigger finger release.  SURGEON:  Toni Arthurs, MD  ANESTHESIA:  Local, MAC.  IV FLUIDS:  See anesthesia record.  ESTIMATED BLOOD LOSS:  Minimal.  TOURNIQUET TIME:  11 minutes at 250 mmHg.  COMPLICATIONS:  None apparent.  DISPOSITION:  Extubated awake and stable to recovery.  INDICATIONS FOR PROCEDURE:  The patient is a 55 year old female with past medical history significant for diabetes and hypertension who complains of triggering of her left long finger for many months.  She had two steroid injections to her palm by her primary care provider. Both injections were successful, but they have again worn off creating tenderness in her palm flexor tendon sheath as well as stiffness and occasional recurring triggering.  She desires surgical treatment for this.  She understands the risks and benefits of this procedure including risks of bleeding, infection, nerve damage, blood clots, need for additional surgery, recurrence of her symptoms, continued pain, amputation, and death.  She would like to proceed.  PROCEDURE IN DETAIL:  After preoperative consent was obtained and correct operative site was identified, the patient was brought to the operating room and placed supine on the operating table.  Preoperative antibiotics were administered.  A surgical time-out was taken.  The patient was administered IV sedation, 5 mL of local anesthetic were infiltrated into the area around the distal palmar crease  of the left long finger.  The anesthetic was plain 0.25% Marcaine and plain 2% lidocaine mixed 50:50.  A transverse incision was marked on the palm approximately 4 mm distal from the distal palmar crease.  The extremity was exsanguinated and the forearm tourniquet was inflated to 250 mmHg. Previously marked incision was made.  Sharp dissection was carried down through the skin.  Blunt dissection was carried down to the flexor tendon sheath.  Care was taken to protect the neurovascular bundles medially and laterally.  The proximal extent of the A1 pulley was identified.  It was incised with a 15 blade under direct vision.  This release was carried distally to the area between the A1 and A2 pulleys. The patient was then asked to make a fist.  There was no evident triggering.  There was significant thickening of the A1 pulley noted. However, there was no significant tenosynovitis or nodule within the sheath identified.  The patient could easily flex and extend her long finger in full composite grip and full extension.  The wound was irrigated copiously.  Vertical mattress sutures of 4-0 nylon were used to close the skin incision.  Sterile dressings were applied.  The tourniquet was released at 11 minutes.  A compression wrap was applied. The patient was then awakened from anesthesia and transported to the recovery room in stable condition.  FOLLOWUP PLAN:  The patient will be encouraged to move her fingers as much as possible for the next 10 days.  She will then follow up with me for suture removal.  She will keep her dressing on for the next 3 days and then remove it, and cover with a Band-Aid as needed.     Toni Arthurs, MD     JH/MEDQ  D:  05/30/2010  T:  05/30/2010  Job:  [id number]  Electronically Signed by Jonny Ruiz Raman Fe[place]ston  on 06/20/2010 10:28:47 PM

## 2010-06-27 ENCOUNTER — Ambulatory Visit
Admission: RE | Admit: 2010-06-27 | Discharge: 2010-06-27 | Disposition: A | Discharge status: Home / Self Care | Payer: PRIVATE HEALTH INSURANCE | Source: Ambulatory Visit | Attending: *Deleted | Admitting: *Deleted

## 2010-06-27 DIAGNOSIS — Z09 Encounter for follow-up examination after completed treatment for conditions other than malignant neoplasm: Secondary | ICD-10-CM

## 2010-07-07 ENCOUNTER — Ambulatory Visit (INDEPENDENT_AMBULATORY_CARE_PROVIDER_SITE_OTHER): Payer: Self-pay | Admitting: Family Medicine

## 2010-07-07 ENCOUNTER — Encounter: Payer: Self-pay | Admitting: Family Medicine

## 2010-07-07 VITALS — BP 162/88 | HR 64 | Temp 98.4°F | Wt 322.0 lb

## 2010-07-07 DIAGNOSIS — M549 Dorsalgia, unspecified: Secondary | ICD-10-CM

## 2010-07-07 DIAGNOSIS — R51 Headache: Secondary | ICD-10-CM

## 2010-07-07 MED ORDER — CYCLOBENZAPRINE HCL 10 MG PO TABS: 10.0000 mg | ORAL_TABLET | Freq: Three times a day (TID) | ORAL | Status: AC | PRN

## 2010-07-07 MED ORDER — KETOROLAC TROMETHAMINE 60 MG/2ML IM SOLN
60.0000 mg | Freq: Once | INTRAMUSCULAR | Status: AC
  Administered 2010-07-07: 60 mg via INTRAMUSCULAR

## 2010-07-07 MED ORDER — IBUPROFEN 600 MG PO TABS: 600.0000 mg | ORAL_TABLET | Freq: Four times a day (QID) | ORAL | Status: AC | PRN

## 2010-07-07 NOTE — Progress Notes (Signed)
  Subjective:    Patient ID: Tammy [name], female    DOB: [birth year], 55 y.o.   MRN: [id number]  HPI Low back pain since last thurs, No injury , thinks its due to her bed being to low, when she tries to get up occ has pain, history of lumbar strain with pregnancies. Has been taking Tylenol which has not helped a lot, no pain with walking, pain with bending and sitting for long periods of time, no weakness in legs, no change in bowel or bladder, no radiation of pain from lumbar region, back is starting to improve   HA- started Thursday, using Tylenol, sharp pain and throbbing on left side of face, took Tylenol last night and went to sleep +photophobia, no phonophobia, no N/V   HTN- took BP meds 30 minutes before visit  Review of Systems     Objective:   Physical Exam   GEN- NAD, obese  HEENT- PERRL, EOMI, Fundoscopic exam benign  Neck- supple, normal ROM  Back- able to squat, able to walk on tips of toes for a few steps( difficulty with balance)     TTP lumbar region, Left paraspinal spasm, spine non tender, neg hip rock   Neg SLR   Strength equal bilat  DTR upper and lower ext- equal bilat  Pain with flexion at hip, able to extend some, no pain with lateral rotation  No pain with IR/ER hip bilat       Assessment & Plan:   HA- new onset during same time as back pain, maybe tension related secondary to pain, no red flags, will monitor, pt to f./u with PCP this week, if still having, get HA diary. No imaging was needed today  Back pain- improving, she is obese, does not exercise or move around a lot, therefore higher risk for MSK complaints. She is to buy a new bed , given flexeril and NSAIDS for pain, no red flags, also shown some stretches to perform.

## 2010-07-07 NOTE — Patient Instructions (Signed)
Start the stretching exercises for your back Use the ibuprofen as needed for pain Take the flexeril as needed F/U on Thursday with Dr. Fara Boros If your headaches do not improve or worsen then go to the ER if needed in the middle of the night

## 2010-07-08 ENCOUNTER — Ambulatory Visit: Payer: Self-pay | Admitting: Family Medicine

## 2010-07-10 ENCOUNTER — Encounter: Payer: Self-pay | Admitting: Family Medicine

## 2010-07-10 ENCOUNTER — Ambulatory Visit (INDEPENDENT_AMBULATORY_CARE_PROVIDER_SITE_OTHER): Payer: Self-pay | Admitting: Family Medicine

## 2010-07-10 DIAGNOSIS — E669 Obesity, unspecified: Secondary | ICD-10-CM

## 2010-07-10 DIAGNOSIS — M545 Low back pain, unspecified: Secondary | ICD-10-CM

## 2010-07-10 DIAGNOSIS — I10 Essential (primary) hypertension: Secondary | ICD-10-CM

## 2010-07-10 MED ORDER — KETOROLAC TROMETHAMINE 60 MG/2ML IJ SOLN
60.0000 mg | Freq: Once | INTRAMUSCULAR | Status: AC
  Administered 2010-07-10: 60 mg via INTRAMUSCULAR

## 2010-07-10 NOTE — Progress Notes (Signed)
SUBJECTIVE:  55 yo w/ HTN presents for f/u of headache and low back pain. Per pt, headache has fully resolved since Monday, no CP or visual changes, no SOB or LE edema. Pt still reports significant back pain, L>R side. On Monday, pain was bilateral, but was given Toradol shot on R and that made her R low back feel better. Pain is still the worst with going from sitting to standing.  Has not yet been able to raise her bed up.  No weakness, no incontinence, sometimes has a little bit of tingling over her outer left thigh, but only occurs when she goes from sitting to standing and resolves spontaneously once she is fully upright.  She has been taking flexeril and motrin scheduled q8h w/o any improvement in pain; flexeril does not help at all, only makes her sleepy.   OBJECTIVE: VSS, BP mildly elevated at 147/82, HR 59. Gen: NAD, uncomfortable when standing and walking CV: RRR, no murmur, 2+ B pedal pulses Back: No obvious deformities or injuries. No TTP over midline or musculature. Able to move BLE, 5/5 strength in thighs, calves, and feet. Sensation intact BLE.   ASSESSMENT/PLAN: For acute pain, rest, intermittent application of cold packs (later, may switch to heat, but do not sleep on heating pad), analgesics and muscle relaxants are recommended. Discussed a home back care exercise program with flexion exercise routine. Consider Physical Therapy and XRay studies if not improving in 4-8 weeks. Weight loss also encouraged.

## 2010-07-10 NOTE — Patient Instructions (Signed)
I'm sorry your back is still bothering you.  We are giving you another shot of the toradol today.  Usually this medicine works everywhere in the body, not just the place that it is given, so it should also help the right side from getting sore again. Definitely try to get some blocks to raise your bed up. Keep doing the back exercises that Dr. Jeanice Lim showed you on Monday. Ultimately, the best thing for your back pain is going to be stretching and weight loss from diet and activity changes. Your blood pressure was a little high today, 147/82, so make sure you are taking your medicine every day.  Diet and exercise will also help with this.  If we can't make any progress with those things, however, we should probably start thinking about adding another BP medication so that you are better controlled.  Come back and see me in 1-2 months for your blood pressure.  Come back sooner, or return to the ED if you loose control of your bowel or bladder, have numbness or tingling in your legs or bottom, or if your legs become weak.

## 2010-07-10 NOTE — Assessment & Plan Note (Signed)
Elevated today, but pt also in pain so will just monitor for now.  Headache has resolved. No red flags. No med changes at this time, as pt's HR (59) will not allow increase in metoprolol and pt is skeptical about trying new BP meds.  Will continue encouraging lifestyle changes including dietary improvements and regular exercise.  F/u in 1-2 month for BP recheck.

## 2010-07-10 NOTE — Assessment & Plan Note (Signed)
Likely contributing to low back pain. Pt has appt w/ Dr. Gerilyn Pilgrim next week. Pt very defensive about not exercising 2/2 back pain.

## 2010-07-10 NOTE — Assessment & Plan Note (Signed)
Has had x1 week, no red flags, no incontinence, numbness, weakness, radiation.  Toradol IM helped 4 days ago, will repeat again today (pt feels that R side has gotten better b/c that was the area where she got the shot; L side is still very bad and has not improved at all; educated pt that Toradol IM works the same as po or IV but pt still convinced she needs shot in L side today instead). Flexeril and motrin not helping; flexeril only makes her sleepy. -Will advise flexeril qhs only; ibuprofen 600mg  q6 hrs scheduled x5 days -No need for imaging as it has only been 1 week of pain; back stretching encouraged; consider PT if still active problem in 1-2 months. Pt advised weight loss would likely give the biggest improvement in the long run.  -Given red flags to return for.

## 2010-07-15 ENCOUNTER — Ambulatory Visit: Payer: Self-pay | Admitting: Family Medicine

## 2010-07-15 NOTE — H&P (Signed)
NAMEKENDALYN, CRANFIELD              ACCOUNT NO.:  [id number]   MEDICAL RECORD NO.:  [id number]          PATIENT TYPE:  INP   LOCATION:  3305                         FACILITY:  [facility]   PHYSICIAN:  Nestor Ramp, MD        DATE OF BIRTH:  1955/07/06   DATE OF ADMISSION:  06/07/2007  DATE OF DISCHARGE:                              HISTORY & PHYSICAL   CHIEF COMPLAINT:  Breast pain, incidental hyperkalemia/acute renal  failure.   HISTORY OF PRESENT ILLNESS:  A 55 year old African-American female  presented to the emergency department today with chronic breast  soreness/infection.  Incidentally also found to have hyperkalemia and  acute renal failure.  Her baseline creatinine is 0.8 as of November  2008.  She is not having any fatigue, urinary changes, itchiness,  dizziness, frequency in her urine, changes of color in her urine or  dysuria.  No edema, fluid buildup or dyspnea noted.  No hematochezia.  No melena.  Her breast infection has been an issue since December 2008.  She had a 7 day course of Bactrim at that time, seen again in February,  periodically seen since then.  Most recently placed back on a 2 week  course of Bactrim and Bactroban ointment.  She is also on Naprosyn for  pain two 500 mg tabs daily.  She has a history of hypertension and also  on ACE inhibitor, specifically lisinopril/hydrochlorothiazide.   ALLERGIES:  NO KNOWN DRUG ALLERGIES.   PAST MEDICAL HISTORY:  1. History of peptic ulcer disease in 1991.  2. History of noncompliance.  3. Tobacco use.  4. History of obesity.  5. History of hypertension.  6. She is postmenopausal.   FAMILY HISTORY:  Negative for renal disease or dialysis that she knows  of.  Does have diabetes and hypertension in first-degree relatives.  Maternal aunts with breast and ovarian cancer.   SOCIAL HISTORY:  She smokes about half a pack per day.  Denies drug or  alcohol use.  Lives alone.   PHYSICAL EXAMINATION:  GENERAL:  No acute  distress.  Morbidly obese  African-American female, alert, appropriate and cooperative throughout  exam.  HEENT:  Head is normocephalic without any abnormalities.  No alopecia or  balding.  Eyes:  Extraocular motions intact.  No conjunctival  inflammation.  Vision grossly normal.  NECK:  Very large, but no specific palpable masses.  CHEST WALL:  Shows no deformity, tenderness or masses aside from the  breast.  Please see breast exam.  BREASTS:  Right breast with 2 well-healed scars from her previous  lesion.  Left breast with a large quarter-sized raw area with no  induration and mild erythema.  Also 1 cm diameter lesion and a few  smaller lesions on the left breast.  Not currently dressed.  Currently  it is crusted and is not draining.  There is also some evidence of  previous tape residue.  LUNGS:  Bilateral posterior wheezing, otherwise clear to auscultation.  Normal respiratory effort.  HEART:  Normal rate and regular rhythm.  S1-S2 normal.  No murmur.  ABDOMEN:  Normal, soft.  Nontender.  No masses.  Bowel sounds are  positive.  RECTAL:  No abnormalities.  Normal sphincter tone.  Guaiac negative.  MUSCULOSKELETAL:  No deformities or scoliosis noted.  Pulses are normal,  dorsalis pedis bilaterally.  NEUROLOGICAL:  Gait and station normal.  Cranial note intact II-XII.  Sensory and motor grossly normal.  SKIN:  Very dry and ashen throughout.  No palpable axillary nodes.   LABORATORY DATA:  CBC is normal except for a hemoglobin of 10.9, MCV is  relatively low 82.  I-STAT 08:  Potassium is 6.9 and was 6.3 on recheck  with creatinine of 2.9, BUN was 68.   ASSESSMENT/PLAN:  A 55 year old African-American female with:  1. Acute renal failure:  This was treated with hyperkalemia with      Kayexalate and insulin in the ED.  There were no peaked T's noted      on EKG.  We have ordered repeat  electrolytes which are pending at      this time.  Consider giving more Kayexalate if not normal  when      these return.  Etiology of this acute renal failure is unknown, but      suspect prerenal versus medication, ACE inhibitor plus NSAID,      potentially plus Bactrim which can also cause renal insufficiency.      We will check renal ultrasound to visualize the kidneys and rule      out obstruction, although clinically obstruction is unlikely given      history of normal urine output.  We will check fractional excretion      of sodium and check urine drug screen.  Also check urinalysis with      microscopy and 24 hour urine protein and creatinine collection.      Consider UPEP and SPEP collection as well.  2. Anemia:  Hemoglobin is 10.9 with low MCV at 82.7, RDW 12.3 and low      RBC count.  This is likely consistent with iron-deficiency anemia.      She is no longer menstruating so a GI source is the most likely      contributor.  Guaiac was negative on exam.  May need further      outpatient workup.  We will order iron studies.  3. Breast impetigo:  We will hold the systemic antibiotics since this      infection at this point seems so superficial.  We will just use      Bactroban 3 times daily dressing changes and keep it covered and      moist which it currently is not.  The patient may need biopsy of      this area and definitely will need a mammogram.  Also consider a      surgical referral for the possibility of inflammatory breast      disease.  We will treat her pain at this point with Percocet as      needed.  4. Hypertension:  The patient had a few hypertensive readings in the      emergency department and was bolused with 1 liter.  Since that      time, she has done better.  Of note, the blood pressure readings      that were low were done with an ill-fitting cuff and has since      improved, but we will admit to a step-down unit for now just in      case since I  want to avoid hypotensive episodes on the regular      floor.  We will of course stop the ACE inhibitor  secondary to acute      renal failure.  Also stop the diuretics for now since this does not      function at creatinine at that level.  She is asymptomatic of this      with no cough or dyspnea.  Never had any history of asthma.  We      will write for albuterol as needed here in the hospital and check      PA and lateral chest x-ray.  Advised to quit smoking.  5. Tobacco dependence.  As stated above, advised to quit smoking with      half pack per day smoking.  We will use 14 mg nicotine patch daily.      Angeline Slim, M.D.  Electronically Signed      Nestor Ramp, MD  Electronically Signed    AL/MEDQ  D:  06/07/2007  T:  06/08/2007  Job:  [id number]

## 2010-07-15 NOTE — Discharge Summary (Signed)
Tammy [name], Tammy [name] NO.:  [id number]   MEDICAL RECORD NO.:  [id number]          PATIENT TYPE:  INP   LOCATION:  3733                         FACILITY:  [facility]   PHYSICIAN:  Leighton Roach McDiarmid, M.D.DATE OF BIRTH:  [birth year]   DATE OF ADMISSION:  06/07/2007  DATE OF DISCHARGE:  06/09/2007                               DISCHARGE SUMMARY   PRIMARY CARE PHYSICIAN:  Neena Rhymes, M.D., [facility].   REASON FOR ADMISSION:  Acute renal failure and hyperkalemia.   DISCHARGE DIAGNOSES:  1. Acute renal failure secondary to a combination of Bactrim plus      Naprosyn plus lisinopril.  2. Hyperkalemia, resolved.  3. Left breast wound.  4. Microcytic anemia.  5. Hypertension.   DISCHARGE MEDICATIONS:  1. Tylenol 1000 mg p.o. q.4 h., p.r.n. pain.  2. Percocet 5/325 q.6 h., p.r.n. instead of Tylenol.  3. Bactroban applied to wound t.i.d.   CONSULTANTS:  None.   PROCEDURES AND STUDIES:  The patient had a renal ultrasound on June 09, 2007, which revealed no acute findings.  The patient had a chest x-ray  on June 07, 2007, which revealed no active cardiopulmonary disease and  mildly decreased lung volumes.   LABORATORY DATA ON ADMISSION:  CBC on admission revealed a white blood  cell count of 5.1, hemoglobin 10.9, hematocrit 31.4 and platelet count  229.  I-stat chem-8  revealed ionized calcium 1.14, bicarb 17,  hemoglobin 11.2, hematocrit 33.0, sodium 135, potassium 6.9, chloride  113, glucose 110, BUN 68 and creatinine 2.9.  Repeat potassium in the  blood was 6.3.  Urinalysis revealed a specific gravity of 1.017, pH 5.0  and was otherwise negative.  Fecal occult blood was negative.  Urine  random creatinine was 213.  Urine random sodium was 72.  Urine drug  screen was positive for marijuana.  Repeat CBC that night after IV  fluids revealed a white blood cell count of 5.0, hemoglobin 9.6,  hematocrit 26.8 and a platelet count of 184.   Comprehensive metabolic  panel done that evening after Kayexalate, insulin, D5-50 and albuterol  revealed a sodium of 133, potassium 5.2, chloride 109, bicarb 16,  glucose 126, BUN 58, creatinine 2.35, total bilirubin 0.5, alkaline  phosphatase 62, AST 22, ALT 14, total protein 7, albumin 3.6 and calcium  8.8.  Total iron binding capacity was 274.  Iron was 89, percent  saturation was 32.  Reticulocyte count was 1.5%.  By the time of  discharge, the patient's basic metabolic panel revealed a sodium of 135,  potassium 5.1, chloride 110, bicarb 18, glucose 107, BUN 27, creatinine  1.56 and calcium 8.7.   HOSPITAL COURSE:  1. Tammy [name] is a very pleasant 55 year old African American female      who presented to the emergency department for left breast pain, and      subsequently had laboratory values drawn which revealed that the      patient was in acute renal failure.  It was felt that this renal      failure was a combination of her Bactrim  that she had been      receiving for her left breast wound, in addition to her lisinopril      and Aleve that she had been taking for her breast pain.  The      patient was aggressively hydrated and a workup was begun to      evaluate for intrinsic kidney disease.  Urinalysis was listed as      above and did not show signs of intrinsic kidney disease.  Renal      ultrasound did not show signs of post obstructive and fractionated      secretion of sodium revealed that the patient likely was prerenal.      For this reason, the patient was aggressively hydrated.  With this,      her creatinine began to improve and was turning in the right      direction at the time of discharge.  2. Hyperkalemia.  The patient was initially treated in the emergency      room with Kayexalate, insulin, D50 and albuterol.  Her EKG did not      show signs of peak T-waves or widened QRS in the ED, given her      potassium level, it was felt that she needed to be treated.   Her      potassium was monitored regularly and while it trended, it was      still elevated at the next basic metabolic panel.  She was given      another dose of Kayexalate, which did bring her potassium down.  It      was a very slow trend down and we were not comfortable with her      discharge until the afternoon of May 09, 2007, when we saw a      continued trend down in her potassium.  This is certainly something      that will need to be monitored at her follow-up appointment.  3. Anemia.  The patient was found to be significantly anemic when she      came in.  For this reason, anemia workup was begun, but      unfortunately was not very revealing as noted above in laboratory      values.  The patient does have a ferritin level pending at the time      of this dictation.  This workup will need to be completed as an      outpatient.  4. History of hypertension.  The patient's blood pressures were stable      with systolics in the one-teens, highest was 153, and systolics in      the 50s to 90s during her hospital stay.  Given her acute renal      failure, we did not restart her ACE inhibitor for now.  This is      certainly something that can be addressed with her primary care      physician in the near future once her kidneys normalize.   INSTRUCTIONS/FOLLOW UP:  The patient is to stop taking her lisinopril  and hydrochlorothiazide.  She is to apply Bactroban ointment to her left  breast wound and cover with gauze.  She may take the medications listed  above for pain.  She is to follow a low-sodium, heart-healthy diet.  She  was no restrictions with regard to activity.  She is to follow up with  Dr. Beverely Low at the [facility] on  June 16, 2007,  at 9:30 a.m., and also has an appointment with her on June 20, 2007, at  4 p.m.  At these appointments, she should have her blood work drawn.  She also has a ferritin level as noted above to follow up on.  I would   recommend that she continue her anemia workup as an outpatient.      Ancil Boozer, MD  Electronically Signed      Leighton Roach McDiarmid, M.D.  Electronically Signed    SA/MEDQ  D:  06/09/2007  T:  06/09/2007  Job:  [id number]   cc:   Neena Rhymes, M.D.

## 2010-07-24 ENCOUNTER — Encounter: Payer: Self-pay | Admitting: Family Medicine

## 2010-08-19 ENCOUNTER — Encounter: Payer: Self-pay | Admitting: Family Medicine

## 2010-08-19 ENCOUNTER — Ambulatory Visit (INDEPENDENT_AMBULATORY_CARE_PROVIDER_SITE_OTHER): Payer: Self-pay | Admitting: Family Medicine

## 2010-08-19 VITALS — BP 142/88 | HR 72 | Temp 98.4°F | Ht 67.0 in | Wt 336.6 lb

## 2010-08-19 DIAGNOSIS — E669 Obesity, unspecified: Secondary | ICD-10-CM

## 2010-08-19 DIAGNOSIS — F17200 Nicotine dependence, unspecified, uncomplicated: Secondary | ICD-10-CM

## 2010-08-19 DIAGNOSIS — I10 Essential (primary) hypertension: Secondary | ICD-10-CM

## 2010-08-19 DIAGNOSIS — M545 Low back pain: Secondary | ICD-10-CM

## 2010-08-19 LAB — BASIC METABOLIC PANEL
CO2: 26 mEq/L (ref 19–32)
Calcium: 9.5 mg/dL (ref 8.4–10.5)
Sodium: 137 mEq/L (ref 135–145)

## 2010-08-19 NOTE — Assessment & Plan Note (Signed)
Elevated today, 141/82.  No red flags. No med changes at this time, as pt's HR usually  will not allow increase in metoprolol (is 72 today but usally in 50's) and pt is skeptical about trying new BP meds.  Will continue encouraging lifestyle changes including dietary improvements and regular exercise.  F/u in 2 month for BP recheck.

## 2010-08-19 NOTE — Assessment & Plan Note (Signed)
Pt to make f/u with Dr. Gerilyn Pilgrim to work on diet and exercise. Has gained 7lbs since last visit. Has just started exercising and eating better in the past few weeks.

## 2010-08-19 NOTE — Patient Instructions (Signed)
It was great to see you! Your blood pressure is still a little high, but we'll keep trying with the weight loss before doing any other medicines.  Please schedule another appointment with Dr. Gerilyn Pilgrim! She's a great resource, especially to give you ideas on how to prepare the fresh vegetables!  We are checking some labs today, I will send you a letter with the results.  Come back and see me in 2-3 months!

## 2010-08-19 NOTE — Progress Notes (Signed)
S: Pt comes in today for HTN follow up.  Has been taking her meds every day, never misses a dose.  Has started trying to eat better with things such as fresh vegetables for the past week or so.  Has also been starting to walk more, tried for 2 times per week. Pt hasn't been eating as much because of the hot weather.  However, she has been eating breakfast every morning.  Back pain has improved, not needing flexeril every day, only needs a few days per week.   Has decreased smoking from 3/4 pack to 1/2 ppd.    O: VSS, BP 142/88, wt 336 (was 329 5/12) Gen: NAD CV: RRR, no murmur Abd: soft, NT Ext: WWP, no edema   A/P:  -will continue current BP management of metoprolol; usually pt's HR would not allow increasing the dose since it is usually in the 50's, today is 72; if still in the 70's at next visit and still hypertensive, could consider increasing BB; explained to pt that we can wait another 2-3 months to see if diet and exercise help improve BP and weight, but if at that time she is still hypertensive, we will need to likely add another medication to help control her BP -BMET checked to f/u K and check Cr -pt is to make appt to see Dr. Gerilyn Pilgrim  -f/u in 2-3 months for BP check

## 2010-08-20 ENCOUNTER — Encounter: Payer: Self-pay | Admitting: Family Medicine

## 2010-11-17 ENCOUNTER — Other Ambulatory Visit: Payer: Self-pay | Admitting: Family Medicine

## 2010-11-17 DIAGNOSIS — Z1231 Encounter for screening mammogram for malignant neoplasm of breast: Secondary | ICD-10-CM

## 2010-11-25 LAB — POCT I-STAT, CHEM 8
BUN: 68 — ABNORMAL HIGH
Calcium, Ion: 1.14
Chloride: 113 — ABNORMAL HIGH
Creatinine, Ser: 2.9 — ABNORMAL HIGH
Glucose, Bld: 110 — ABNORMAL HIGH
HCT: 33 — ABNORMAL LOW
Hemoglobin: 11.2 — ABNORMAL LOW
Potassium: 6.9
Sodium: 135
TCO2: 17

## 2010-11-25 LAB — CBC
HCT: 24.5 — ABNORMAL LOW
HCT: 26.8 — ABNORMAL LOW
HCT: 31.4 — ABNORMAL LOW
Hemoglobin: 10.9 — ABNORMAL LOW
Hemoglobin: 8.5 — ABNORMAL LOW
Hemoglobin: 9.6 — ABNORMAL LOW
MCHC: 34.5
MCHC: 35.7
MCV: 82.3
MCV: 83.5
MCV: 83.9
Platelets: 182
Platelets: 184
Platelets: 206
RBC: 2.94 — ABNORMAL LOW
RBC: 3.26 — ABNORMAL LOW
RDW: 12.3
RDW: 12.3
RDW: 12.4
WBC: 4.4
WBC: 4.8
WBC: 5

## 2010-11-25 LAB — URINE MICROSCOPIC-ADD ON

## 2010-11-25 LAB — FERRITIN: Ferritin: 359 — ABNORMAL HIGH (ref 10–291)

## 2010-11-25 LAB — COMPREHENSIVE METABOLIC PANEL
Albumin: 3.6
BUN: 50 — ABNORMAL HIGH
Calcium: 8.8
Creatinine, Ser: 2.35 — ABNORMAL HIGH
Total Bilirubin: 0.5
Total Protein: 7

## 2010-11-25 LAB — RENAL FUNCTION PANEL
CO2: 20
Glucose, Bld: 96
Potassium: 5.3 — ABNORMAL HIGH
Sodium: 133 — ABNORMAL LOW

## 2010-11-25 LAB — BLOOD GAS, ARTERIAL
Acid-base deficit: 6.9 — ABNORMAL HIGH
Drawn by: 29017
FIO2: 0.21
pCO2 arterial: 32.7 — ABNORMAL LOW
pH, Arterial: 7.351

## 2010-11-25 LAB — DIFFERENTIAL
Basophils Relative: 1
Eosinophils Absolute: 0.1
Eosinophils Relative: 2
Lymphocytes Relative: 31
Lymphs Abs: 1.6
Lymphs Abs: 2.4
Monocytes Absolute: 0.4
Monocytes Relative: 8
Neutrophils Relative %: 38 — ABNORMAL LOW

## 2010-11-25 LAB — URINALYSIS, ROUTINE W REFLEX MICROSCOPIC
Ketones, ur: NEGATIVE
Nitrite: NEGATIVE
Protein, ur: NEGATIVE

## 2010-11-25 LAB — BASIC METABOLIC PANEL WITH GFR
CO2: 20
Calcium: 8.4
Creatinine, Ser: 1.47 — ABNORMAL HIGH
GFR calc Af Amer: 46 — ABNORMAL LOW
GFR calc non Af Amer: 38 — ABNORMAL LOW
Sodium: 136

## 2010-11-25 LAB — BASIC METABOLIC PANEL
BUN: 27 — ABNORMAL HIGH
BUN: 33 — ABNORMAL HIGH
CO2: 18 — ABNORMAL LOW
CO2: 20
Chloride: 106
Chloride: 110
Chloride: 113 — ABNORMAL HIGH
Creatinine, Ser: 1.56 — ABNORMAL HIGH
GFR calc Af Amer: 36 — ABNORMAL LOW
GFR calc non Af Amer: 32 — ABNORMAL LOW
Glucose, Bld: 100 — ABNORMAL HIGH
Glucose, Bld: 107 — ABNORMAL HIGH
Glucose, Bld: 113 — ABNORMAL HIGH
Potassium: 5.2 — ABNORMAL HIGH
Potassium: 5.6 — ABNORMAL HIGH
Sodium: 132 — ABNORMAL LOW
Sodium: 134 — ABNORMAL LOW

## 2010-11-25 LAB — RETICULOCYTES: Retic Ct Pct: 1.5

## 2010-11-25 LAB — PHOSPHORUS: Phosphorus: 3.8

## 2010-11-25 LAB — IRON AND TIBC
Iron: 89
TIBC: 274

## 2010-11-25 LAB — RAPID URINE DRUG SCREEN, HOSP PERFORMED
Benzodiazepines: NOT DETECTED
Tetrahydrocannabinol: POSITIVE — AB

## 2010-11-25 LAB — OCCULT BLOOD X 1 CARD TO LAB, STOOL: Fecal Occult Bld: NEGATIVE

## 2010-11-25 LAB — SODIUM, URINE, RANDOM: Sodium, Ur: 72

## 2010-12-10 LAB — I-STAT 8, (EC8 V) (CONVERTED LAB)
Acid-base deficit: 2
BUN: 12
Chloride: 109
HCT: 43
Potassium: 4.3
pCO2, Ven: 40 — ABNORMAL LOW
pH, Ven: 7.366 — ABNORMAL HIGH

## 2010-12-10 LAB — DIFFERENTIAL
Eosinophils Relative: 2
Lymphocytes Relative: 50 — ABNORMAL HIGH
Lymphs Abs: 2.1
Monocytes Absolute: 0.4
Monocytes Relative: 9
Neutro Abs: 1.6 — ABNORMAL LOW

## 2010-12-10 LAB — CBC
HCT: 37.8
Hemoglobin: 12.6
RBC: 4.6
WBC: 4.2

## 2010-12-10 LAB — URINALYSIS, ROUTINE W REFLEX MICROSCOPIC
Bilirubin Urine: NEGATIVE
Glucose, UA: NEGATIVE
Ketones, ur: NEGATIVE
Nitrite: NEGATIVE
pH: 5.5

## 2010-12-10 LAB — URINE MICROSCOPIC-ADD ON

## 2010-12-19 ENCOUNTER — Ambulatory Visit: Payer: Self-pay

## 2011-11-23 ENCOUNTER — Ambulatory Visit (INDEPENDENT_AMBULATORY_CARE_PROVIDER_SITE_OTHER): Payer: Self-pay | Admitting: Family Medicine

## 2011-11-23 ENCOUNTER — Encounter: Payer: Self-pay | Admitting: Family Medicine

## 2011-11-23 VITALS — BP 180/84 | HR 60 | Ht 67.0 in | Wt 317.0 lb

## 2011-11-23 DIAGNOSIS — E785 Hyperlipidemia, unspecified: Secondary | ICD-10-CM

## 2011-11-23 DIAGNOSIS — E669 Obesity, unspecified: Secondary | ICD-10-CM

## 2011-11-23 DIAGNOSIS — I10 Essential (primary) hypertension: Secondary | ICD-10-CM

## 2011-11-23 DIAGNOSIS — Z111 Encounter for screening for respiratory tuberculosis: Secondary | ICD-10-CM

## 2011-11-23 MED ORDER — METOPROLOL TARTRATE 100 MG PO TABS: 100.0000 mg | ORAL_TABLET | Freq: Two times a day (BID) | ORAL | Status: DC

## 2011-11-23 NOTE — Patient Instructions (Signed)
It was good to see you today (finally).  I refilled your blood pressure medicine.   When you come and get your PPD read on Wednesday, come FASTING and we will draw some labs then.   Come back and see me in about 1 month so we can make sure your blood pressure is better again.  You need pap at that time.

## 2011-11-23 NOTE — Assessment & Plan Note (Signed)
Pt to get FLP when she returns in 2 days to have PPD read.

## 2011-11-23 NOTE — Assessment & Plan Note (Signed)
Has lost some weight since last visit >1 year.  Encouraged increased walking and portion control.

## 2011-11-23 NOTE — Progress Notes (Signed)
S: Pt comes in today for follow up.  HYPERTENSION BP: 180/84 Meds: NONE x6 mo (supposed to be on metoprolol 100 BID) Taking meds: No    # of doses missed/week: all Symptoms: Headache: No Dizziness: No Vision changes: No SOB:  No Chest pain: No LE swelling: No Tobacco use: Yes   OBESITY Patient is obese.  Diet: decreased appetite from stress; staying away from potatoes, bread  Exercise: walking complex where she lives, 2x/week Related conditions: HTN, HLD  Wt Readings from Last 3 Encounters:  11/23/11 317 lb (143.79 kg)  08/19/10 336 lb 9.6 oz (152.681 kg)  07/10/10 329 lb (149.233 kg)      ROS: Per HPI  History  Smoking status  . Current Every Day Smoker -- 0.5 packs/day  . Types: Cigarettes  Smokeless tobacco  . Never Used  Comment: does not want to quit    O:  Filed Vitals:   11/23/11 1437  BP: 180/84  Pulse: 60    Gen: NAD, obese CV: RRR, no murmur Pulm: CTA bilat, no wheezes or crackles Ext: Warm, no chronic skin changes, no edema   A/P: 56 y.o. female p/w HTN, off meds; obesity -See problem list -f/u in 1 month for BP recheck and pap

## 2011-11-23 NOTE — Assessment & Plan Note (Signed)
BP elevated today; off meds >6 months; previously well controlled on metoprolol 100 BID.  Restart meds, f/u 1 month for recheck

## 2011-11-25 ENCOUNTER — Other Ambulatory Visit: Payer: Self-pay

## 2011-11-25 ENCOUNTER — Ambulatory Visit (INDEPENDENT_AMBULATORY_CARE_PROVIDER_SITE_OTHER): Payer: Self-pay | Admitting: *Deleted

## 2011-11-25 DIAGNOSIS — I10 Essential (primary) hypertension: Secondary | ICD-10-CM

## 2011-11-25 DIAGNOSIS — IMO0001 Reserved for inherently not codable concepts without codable children: Secondary | ICD-10-CM

## 2011-11-25 DIAGNOSIS — Z111 Encounter for screening for respiratory tuberculosis: Secondary | ICD-10-CM

## 2011-11-25 DIAGNOSIS — E669 Obesity, unspecified: Secondary | ICD-10-CM

## 2011-11-25 DIAGNOSIS — E785 Hyperlipidemia, unspecified: Secondary | ICD-10-CM

## 2011-11-25 LAB — TB SKIN TEST
Induration: 0 mm
TB Skin Test: NEGATIVE

## 2011-11-25 LAB — LIPID PANEL
Cholesterol: 200 mg/dL (ref 0–200)
HDL: 57 mg/dL (ref >39)
LDL Cholesterol: 128 mg/dL — ABNORMAL HIGH (ref 0–99)
Triglycerides: 76 mg/dL (ref <150)

## 2011-11-25 LAB — BASIC METABOLIC PANEL
BUN: 19 mg/dL (ref 6–23)
Chloride: 105 mEq/L (ref 96–112)
Creat: 1.06 mg/dL (ref 0.50–1.10)
Potassium: 4.3 mEq/L (ref 3.5–5.3)

## 2011-11-25 NOTE — Progress Notes (Signed)
BMP AND FLP DONE TODAY Tammy [name] 

## 2011-11-25 NOTE — Progress Notes (Signed)
Spoke with Dr. Mauricio Po regarding reading PPD . Advised OK to read at this time. Negative , 0 mm induration.

## 2011-11-26 ENCOUNTER — Encounter: Payer: Self-pay | Admitting: Family Medicine

## 2011-12-23 ENCOUNTER — Encounter: Payer: Self-pay | Admitting: Family Medicine

## 2011-12-23 ENCOUNTER — Ambulatory Visit: Payer: Self-pay

## 2012-03-08 ENCOUNTER — Ambulatory Visit: Payer: Self-pay | Admitting: Family Medicine

## 2012-03-11 ENCOUNTER — Other Ambulatory Visit (HOSPITAL_COMMUNITY)
Admission: RE | Admit: 2012-03-11 | Discharge: 2012-03-11 | Disposition: A | Discharge status: Home / Self Care | Payer: No Typology Code available for payment source | Source: Ambulatory Visit | Attending: Family Medicine | Admitting: Family Medicine

## 2012-03-11 ENCOUNTER — Ambulatory Visit (INDEPENDENT_AMBULATORY_CARE_PROVIDER_SITE_OTHER): Payer: No Typology Code available for payment source | Admitting: Family Medicine

## 2012-03-11 ENCOUNTER — Encounter: Payer: Self-pay | Admitting: Family Medicine

## 2012-03-11 VITALS — BP 144/90 | HR 63 | Ht 67.0 in | Wt 307.0 lb

## 2012-03-11 DIAGNOSIS — M25562 Pain in left knee: Secondary | ICD-10-CM | POA: Insufficient documentation

## 2012-03-11 DIAGNOSIS — Z113 Encounter for screening for infections with a predominantly sexual mode of transmission: Secondary | ICD-10-CM | POA: Insufficient documentation

## 2012-03-11 DIAGNOSIS — R209 Unspecified disturbances of skin sensation: Secondary | ICD-10-CM

## 2012-03-11 DIAGNOSIS — M25569 Pain in unspecified knee: Secondary | ICD-10-CM

## 2012-03-11 DIAGNOSIS — Z209 Contact with and (suspected) exposure to unspecified communicable disease: Secondary | ICD-10-CM

## 2012-03-11 DIAGNOSIS — I10 Essential (primary) hypertension: Secondary | ICD-10-CM

## 2012-03-11 DIAGNOSIS — R200 Anesthesia of skin: Secondary | ICD-10-CM | POA: Insufficient documentation

## 2012-03-11 DIAGNOSIS — Z01419 Encounter for gynecological examination (general) (routine) without abnormal findings: Secondary | ICD-10-CM | POA: Insufficient documentation

## 2012-03-11 DIAGNOSIS — Z1151 Encounter for screening for human papillomavirus (HPV): Secondary | ICD-10-CM | POA: Insufficient documentation

## 2012-03-11 DIAGNOSIS — Z0000 Encounter for general adult medical examination without abnormal findings: Secondary | ICD-10-CM | POA: Insufficient documentation

## 2012-03-11 DIAGNOSIS — Z124 Encounter for screening for malignant neoplasm of cervix: Secondary | ICD-10-CM

## 2012-03-11 DIAGNOSIS — Z23 Encounter for immunization: Secondary | ICD-10-CM

## 2012-03-11 DIAGNOSIS — E669 Obesity, unspecified: Secondary | ICD-10-CM

## 2012-03-11 LAB — BASIC METABOLIC PANEL
BUN: 18 mg/dL (ref 6–23)
CO2: 24 mEq/L (ref 19–32)
Calcium: 9.3 mg/dL (ref 8.4–10.5)
Chloride: 106 mEq/L (ref 96–112)
Creat: 1.07 mg/dL (ref 0.50–1.10)

## 2012-03-11 LAB — HIV ANTIBODY (ROUTINE TESTING W REFLEX): HIV: NONREACTIVE

## 2012-03-11 NOTE — Progress Notes (Deleted)
S: Pt comes in today for follow up.  HYPERTENSION BP:  Meds: metoprolol 100 BID Taking meds: {EXAM; YES/NO:19492::"No"}    # of doses missed/week: {gen number [id number]} Symptoms: Headache: {EXAM; YES/NO:19492::"No"} Dizziness: {EXAM; YES/NO:19492::"No"} Vision changes: {EXAM; YES/NO:19492::"No"} SOB:  {EXAM; YES/NO:19492::"No"} Chest pain: {EXAM; YES/NO:19492::"No"} LE swelling: {EXAM; YES/NO:19492::"No"} Tobacco use: {Yes/No:18319}   OBESITY Patient is obese.  Diet: Exercise: Related conditions: HTN, HLD, back pain ***  ROS: Per HPI  History  Smoking status  . Current Every Day Smoker -- 0.5 packs/day  . Types: Cigarettes  Smokeless tobacco  . Never Used    Comment: does not want to quit    O: There were no vitals filed for this visit.  Gen: NAD, obese CV: RRR, no murmur Pulm: CTA bilat, no wheezes or crackles Ext: Warm, no chronic skin changes, no edema   A/P: 57 y.o. female p/w *** -See problem list -f/u in ***

## 2012-03-11 NOTE — Assessment & Plan Note (Addendum)
Pap done today- next pap due to 5 years. Also counseled on importance of colonoscopy and flu shot.  Patient declines colonoscopy, flu shot given

## 2012-03-11 NOTE — Assessment & Plan Note (Signed)
Deferred as pt was here for pap smear.  Could be arthritis. Consider injection in the future. Given information on [facility].

## 2012-03-11 NOTE — Progress Notes (Signed)
  Subjective:     Tammy [name] is a 57 y.o. female and is here for a comprehensive physical exam. The patient reports problems - HTN. HYPERTENSION BP: 176/115 --> 144/90 Meds: metoprolol 100 BID Taking meds: Yes     # of doses missed/week: 0 Symptoms: Headache: No Dizziness: No Vision changes: No SOB:  No Chest pain: No LE swelling: No Tobacco use: Yes She reports being really stressed right now  Other things: L knee pain- at knee cap, worse after sitting when she stands up Having warm sensation L lower leg,  "all the time" R leg numbness- usually when standing- numb from thigh to foot, gets better with sitting, no pain Wants STD and HIV tests- was with someone for 13 years and found out he had cheated on her   History   Social History  . Marital Status: Single    Spouse Name: N/A    Number of Children: N/A  . Years of Education: N/A   Occupational History  . Not on file.   Social History Main Topics  . Smoking status: Current Every Day Smoker -- 0.5 packs/day    Types: Cigarettes  . Smokeless tobacco: Never Used     Comment: does not want to quit  . Alcohol Use: No  . Drug Use: No  . Sexually Active: Not Currently    Birth Control/ Protection: Abstinence   Other Topics Concern  . Not on file   Social History Narrative  . No narrative on file   Health Maintenance  Topic Date Due  . Colonoscopy  07/11/2006  . Influenza Vaccine  11/01/2011  . Pap Smear  11/02/2011  . Mammogram  06/26/2012  . Tetanus/tdap  06/14/2016    The following portions of the patient's history were reviewed and updated as appropriate: allergies, current medications, past family history, past medical history, past social history, past surgical history and problem list.  Review of Systems Pertinent items are noted in HPI.   Objective:    BP 176/115  Pulse 63  Ht 5\' 7"  (1.702 m)  Wt 307 lb (139.254 kg)  BMI 48.08 kg/m2 General appearance: alert, cooperative, no distress and moderately  obese Lungs: clear to auscultation bilaterally Heart: regular rate and rhythm, S1, S2 normal, no murmur, click, rub or gallop Abdomen: soft, non-tender; bowel sounds normal; no masses,  no organomegaly Pelvic: cervix normal in appearance, external genitalia normal, no adnexal masses or tenderness, no cervical motion tenderness, rectovaginal septum normal, uterus normal size, shape, and consistency and vagina normal without discharge Extremities: extremities normal, atraumatic, no cyanosis or edema Skin: Skin color, texture, turgor normal. No rashes or lesions Lymph nodes: no cervical LAD Neurologic: Grossly normal    Assessment:    Healthy female exam. HTN uncontrolled     Plan:     See After Visit Summary for Counseling Recommendations

## 2012-03-11 NOTE — Assessment & Plan Note (Signed)
May be radiculopathy. No red flags on history. Exam deferred to follow up visit with me or Sports med.

## 2012-03-11 NOTE — Assessment & Plan Note (Signed)
Has lost almost 30# since 08/2011.  Praised pt for this and encouraged her to continue.

## 2012-03-11 NOTE — Assessment & Plan Note (Addendum)
Elevated today, much better on recheck with thigh cuff on LUE. Continue current regimen of metoprolol 100 BID. Check BMET  BP Readings from Last 3 Encounters:  03/11/12 176/115  11/23/11 180/84  08/19/10 142/88   BP Readings from Last 3 Encounters:  03/11/12 144/90  11/23/11 180/84  08/19/10 142/88

## 2012-03-11 NOTE — Patient Instructions (Addendum)
It was good to see you today.  Your blood pressure was better when we rechecked it.  I still want to recheck it in 4-6 weeks.  We gave you your flu shot.  We did your pap smear.  You will not need another one for 5 years.  I will send you a letter with the results.    I will also send you a letter with the results of the other labs we checked.  Keep up the great work with the weight loss!  Come back to see me in at least 4-6 weeks.  You can come back sooner to discuss your leg problems. Or, you can make an appointment to see the Sports Medicine doctors by calling [phone].  I think the reason for your right leg numbness is a radiculopathy-- I am giving you some information to read about it.   Lumbosacral Radiculopathy Lumbosacral radiculopathy is a pinched nerve or nerves in the low back (lumbosacral area). When this happens you may have weakness in your legs and may not be able to stand on your toes. You may have pain going down into your legs. There may be difficulties with walking normally. There are many causes of this problem. Sometimes this may happen from an injury, or simply from arthritis or boney problems. It may also be caused by other illnesses such as diabetes. If there is no improvement after treatment, further studies may be done to find the exact cause. DIAGNOSIS  X-rays may be needed if the problems become long standing. Electromyograms may be done. This study is one in which the working of nerves and muscles is studied. HOME CARE INSTRUCTIONS   Applications of ice packs may be helpful. Ice can be used in a plastic bag with a towel around it to prevent frostbite to skin. This may be used every 2 hours for 20 to 30 minutes, or as needed, while awake, or as directed by your caregiver.  Only take over-the-counter or prescription medicines for pain, discomfort, or fever as directed by your caregiver.  If physical therapy was prescribed, follow your caregiver's directions. SEEK  IMMEDIATE MEDICAL CARE IF:   You have pain not controlled with medications.  You seem to be getting worse rather than better.  You develop increasing weakness in your legs.  You develop loss of bowel or bladder control.  You have difficulty with walking or balance, or develop clumsiness in the use of your legs.  You have a fever. MAKE SURE YOU:   Understand these instructions.  Will watch your condition.  Will get help right away if you are not doing well or get worse. Document Released: 02/16/2005 Document Revised: 05/11/2011 Document Reviewed: 10/07/2007 [facility] Patient Information 2013 [place], [place].

## 2012-03-12 LAB — SYPHILIS: RPR W/REFLEX TO RPR TITER AND TREPONEMAL ANTIBODIES, TRADITIONAL SCREENING AND DIAGNOSIS ALGORITHM

## 2012-03-17 ENCOUNTER — Encounter: Payer: Self-pay | Admitting: Family Medicine

## 2012-11-25 ENCOUNTER — Ambulatory Visit: Payer: No Typology Code available for payment source | Admitting: Emergency Medicine

## 2012-11-28 ENCOUNTER — Ambulatory Visit (INDEPENDENT_AMBULATORY_CARE_PROVIDER_SITE_OTHER): Payer: No Typology Code available for payment source | Admitting: Family Medicine

## 2012-11-28 ENCOUNTER — Encounter: Payer: Self-pay | Admitting: Family Medicine

## 2012-11-28 VITALS — BP 139/91 | HR 80 | Temp 98.6°F | Ht 67.0 in | Wt 310.0 lb

## 2012-11-28 DIAGNOSIS — Z23 Encounter for immunization: Secondary | ICD-10-CM

## 2012-11-28 DIAGNOSIS — Z111 Encounter for screening for respiratory tuberculosis: Secondary | ICD-10-CM

## 2012-11-28 DIAGNOSIS — I10 Essential (primary) hypertension: Secondary | ICD-10-CM

## 2012-11-28 LAB — CBC WITH DIFFERENTIAL/PLATELET
Eosinophils Absolute: 0.1 10*3/uL (ref 0.0–0.7)
Eosinophils Relative: 1 % (ref 0–5)
Hemoglobin: 10.5 g/dL — ABNORMAL LOW (ref 12.0–15.0)
Lymphocytes Relative: 41 % (ref 12–46)
Lymphs Abs: 1.9 10*3/uL (ref 0.7–4.0)
MCH: 26.7 pg (ref 26.0–34.0)
MCV: 81.7 fL (ref 78.0–100.0)
Monocytes Relative: 7 % (ref 3–12)
Neutrophils Relative %: 51 % (ref 43–77)
Platelets: 221 10*3/uL (ref 150–400)
RBC: 3.93 MIL/uL (ref 3.87–5.11)
WBC: 4.7 10*3/uL (ref 4.0–10.5)

## 2012-11-28 LAB — BASIC METABOLIC PANEL
BUN: 21 mg/dL (ref 6–23)
Creat: 1.11 mg/dL — ABNORMAL HIGH (ref 0.50–1.10)
Glucose, Bld: 95 mg/dL (ref 70–99)

## 2012-11-28 LAB — LIPID PANEL
Cholesterol: 208 mg/dL — ABNORMAL HIGH (ref 0–200)
Triglycerides: 79 mg/dL (ref <150)

## 2012-11-28 MED ORDER — METOPROLOL TARTRATE 100 MG PO TABS: 100.0000 mg | ORAL_TABLET | Freq: Two times a day (BID) | ORAL | Status: DC

## 2012-11-28 NOTE — Patient Instructions (Addendum)
Continue to take metoprolol daily. Keep appointment with Dr. Piedad Climes. Have lab work drawn today.

## 2012-11-29 NOTE — Assessment & Plan Note (Signed)
Patient's blood pressure is borderline elevated today. We'll continue current dose of Lopressor. Patient has upcoming appointment with her PCP at which time she'll need recheck of her pressure.

## 2012-11-29 NOTE — Progress Notes (Signed)
  Subjective:    Patient ID: Tammy [name], female    DOB: 04-07-1955, 57 y.o.   MRN: [id number]  HPI 57 year old female presents for followup of hypertension. 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 This paperwork has previously been filled out by her previous primary care physician one year ago. The patient does have chronic low back pain with some radiation of pain to lower extremities, this is well-controlled with as needed NSAIDs, patient is planning to followup with her primary care physician in the coming weeks for further evaluation , she is able to lift 20-25 pounds without difficulty, his medical illnesses have not per her from participating in her job previously.   Review of Systems  Constitutional: Negative for fever, chills and fatigue.  Respiratory: Negative for cough, choking and shortness of breath.   Cardiovascular: Negative for chest pain and leg swelling.  Gastrointestinal: Negative for nausea, vomiting and diarrhea.       Objective:   Physical Exam Vitals: Reviewed General: Pleasant African American female, no acute distress HEENT: Vitals are equal round and reactive to light, extra amounts are intact, moist mucous members, uvula midline, no pharyngeal erythema or exudate noted, neck was supple Cardiac: Regular rate and rhythm, S1 and S2 present, no murmurs, no heaves or thrills Respiratory: Clear to auscultation bilaterally, normal effort Extremity no edema, 2+ radial and dorsalis pedis pulses       Assessment & Plan:  Please see problem specific assessment and plan. 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

## 2012-11-30 ENCOUNTER — Encounter: Payer: Self-pay | Admitting: Family Medicine

## 2012-11-30 ENCOUNTER — Ambulatory Visit: Payer: No Typology Code available for payment source | Admitting: *Deleted

## 2012-11-30 DIAGNOSIS — Z111 Encounter for screening for respiratory tuberculosis: Secondary | ICD-10-CM

## 2012-11-30 LAB — TB SKIN TEST: TB Skin Test: NEGATIVE

## 2012-11-30 NOTE — Progress Notes (Signed)
PPD Reading Note  PPD read and results entered in EpicCare.  Result: 0 mm induration.  Interpretation: negative  If test not read within 48-72 hours of initial placement, patient advised to repeat in other arm 1-3 weeks after this test.  Allergic reaction: no

## 2012-12-01 ENCOUNTER — Ambulatory Visit: Payer: No Typology Code available for payment source | Admitting: Family Medicine

## 2013-01-12 ENCOUNTER — Ambulatory Visit: Payer: No Typology Code available for payment source | Admitting: Emergency Medicine

## 2013-01-25 ENCOUNTER — Encounter: Payer: Self-pay | Admitting: Emergency Medicine

## 2013-01-25 ENCOUNTER — Ambulatory Visit (INDEPENDENT_AMBULATORY_CARE_PROVIDER_SITE_OTHER): Payer: No Typology Code available for payment source | Admitting: Emergency Medicine

## 2013-01-25 VITALS — BP 137/86 | HR 50 | Temp 98.0°F | Ht 67.0 in | Wt 305.0 lb

## 2013-01-25 DIAGNOSIS — M6530 Trigger finger, unspecified finger: Secondary | ICD-10-CM

## 2013-01-25 DIAGNOSIS — I10 Essential (primary) hypertension: Secondary | ICD-10-CM

## 2013-01-25 DIAGNOSIS — G5600 Carpal tunnel syndrome, unspecified upper limb: Secondary | ICD-10-CM

## 2013-01-25 DIAGNOSIS — E785 Hyperlipidemia, unspecified: Secondary | ICD-10-CM

## 2013-01-25 MED ORDER — METOPROLOL TARTRATE 100 MG PO TABS: 100.0000 mg | ORAL_TABLET | Freq: Two times a day (BID) | ORAL | Status: DC

## 2013-01-25 NOTE — Assessment & Plan Note (Signed)
Exam consistent with mild carpal tunnel. Discussed nighttime bracing. Follow up as needed.

## 2013-01-25 NOTE — Patient Instructions (Addendum)
It was nice to meet you!  Your blood pressure is 137/86 today. Take all medication as prescribed. Try to get 30 minutes of moderate activity 5 days a week. Eat a low salt diet.  Follow the DASH diet to see if we can lower your cholesterol.  Get wrist braces at [facility].  Wear them at night.  I think this will help the numbness you get in your hands.  Please call [facility] at [phone] to schedule a screening mammogram. You can also call [facility], but they do not take the orange card.  Follow up in 6 months to recheck your blood pressure and cholesterol.

## 2013-01-25 NOTE — Assessment & Plan Note (Addendum)
Well controlled. Continue metoprolol 100mg  BID.  Diet and exercise discussed. Follow up in 6 months.

## 2013-01-25 NOTE — Assessment & Plan Note (Signed)
Discussed lifestyle changes. DASH diet given. F/u in 6 months for repeat lipids.

## 2013-01-25 NOTE — Progress Notes (Signed)
   Subjective:    Patient ID: Tammy [name], female    DOB: January 16, 1956, 57 y.o.   MRN: [id number]  HPI Tammy [name] is here for med refill and mammogram.  Hypertension Compliant with medication: yes Side effects from medication: no Check BP at home: no  Low salt diet: no Exercise: yes  Chest pain: no Palpitations: no Vision changes: no Leg edema: no Dizziness: no  Hyperlipidemia She would like to go over results of her recent blood work.  She is reluctant to start any new medications.  Hand numbness She reports that she will get intermittent numbness in her hands; mostly located in middle fingers.  Denies weakness or dropping things.  States it seems to be positional.  She states she had a left middle finger release a year or so ago and has not had problems with trigger finger since then.  I have reviewed and updated the following as appropriate: allergies, current medications, past family history, past medical history, past social history, past surgical history and problem list SHx: current smoker  Health Maintenance: information on mammogram given   Review of Systems See HPI    Objective:   Physical Exam BP 137/86  Pulse 50  Temp(Src) 98 F (36.7 C) (Oral)  Ht 5\' 7"  (1.702 m)  Wt 305 lb (138.347 kg)  BMI 47.76 kg/m2 Gen: alert, cooperative, NAD HEENT: AT/[place], sclera white, MMM Neck: supple CV: RRR, no murmurs Pulm: CTAB, no wheezes or rales Ext: trace edema in bilateral ankles, 1+ DP pulses bilaterally Wrists: full range of motion; no swelling or erythema; tinel's positive bilaterally      Assessment & Plan:

## 2013-01-30 ENCOUNTER — Other Ambulatory Visit: Payer: Self-pay | Admitting: Emergency Medicine

## 2013-02-20 ENCOUNTER — Other Ambulatory Visit: Payer: Self-pay | Admitting: Emergency Medicine

## 2013-02-20 DIAGNOSIS — Z1231 Encounter for screening mammogram for malignant neoplasm of breast: Secondary | ICD-10-CM

## 2013-03-07 ENCOUNTER — Ambulatory Visit (HOSPITAL_COMMUNITY)
Admission: RE | Admit: 2013-03-07 | Discharge: 2013-03-07 | Disposition: A | Discharge status: Home / Self Care | Payer: No Typology Code available for payment source | Source: Ambulatory Visit | Attending: Family Medicine | Admitting: Family Medicine

## 2013-03-07 DIAGNOSIS — Z1231 Encounter for screening mammogram for malignant neoplasm of breast: Secondary | ICD-10-CM | POA: Insufficient documentation

## 2013-03-14 ENCOUNTER — Ambulatory Visit: Payer: No Typology Code available for payment source

## 2013-03-30 ENCOUNTER — Ambulatory Visit: Payer: No Typology Code available for payment source

## 2013-04-18 ENCOUNTER — Ambulatory Visit: Payer: No Typology Code available for payment source | Admitting: Emergency Medicine

## 2013-04-28 ENCOUNTER — Ambulatory Visit: Payer: No Typology Code available for payment source | Admitting: Emergency Medicine

## 2013-05-15 ENCOUNTER — Encounter: Payer: Self-pay | Admitting: Emergency Medicine

## 2013-05-15 ENCOUNTER — Ambulatory Visit (INDEPENDENT_AMBULATORY_CARE_PROVIDER_SITE_OTHER): Payer: No Typology Code available for payment source | Admitting: Emergency Medicine

## 2013-05-15 VITALS — BP 169/89 | HR 54 | Temp 98.0°F | Ht 67.0 in | Wt 315.0 lb

## 2013-05-15 DIAGNOSIS — Z1211 Encounter for screening for malignant neoplasm of colon: Secondary | ICD-10-CM

## 2013-05-15 DIAGNOSIS — E785 Hyperlipidemia, unspecified: Secondary | ICD-10-CM

## 2013-05-15 DIAGNOSIS — I10 Essential (primary) hypertension: Secondary | ICD-10-CM

## 2013-05-15 MED ORDER — METOPROLOL TARTRATE 100 MG PO TABS: 100.0000 mg | ORAL_TABLET | Freq: Two times a day (BID) | ORAL | Status: DC

## 2013-05-15 NOTE — Patient Instructions (Signed)
It was nice to see you!  I refilled your blood pressure medicine for another year.  I'm sorry about the colonoscopy. If you decide to go the stool card screening, please let me know. I also provided you with the numbers of the GI offices in town. You can try calling them to ask about the colonoscopy, but [facility] said the orange card does not cover screening colonoscopy.  Please f/u with me in 6 months for your blood pressure.

## 2013-05-15 NOTE — Assessment & Plan Note (Signed)
Did not get a chance to address this with her given her fixation on the colonoscopy. She does need to be on a statin - ASCVD risk 14.5%. Will need to be addressed in the future.

## 2013-05-15 NOTE — Progress Notes (Signed)
   Subjective:    Patient ID: Tammy [name], female    DOB: 1955/05/31, 58 y.o.   MRN: [id number]  HPI Tammy [name] is here for f/u HTN.  Hypertension Compliant with medication: yes - has not taken her metoprolol yet this morning Side effects from medication: no Check BP at home: no  Chest pain: no Palpitations: no Vision changes: no Leg edema: no Dizziness: no  Colonoscopy Patient would like to have a colonoscopy.  States that her friend who is seen at the IM clinic got a screening colonoscopy at no charge through the orange card.  She is very upset that she has not been able to get a colonoscopy.  She denies any blood in her stool.   No current outpatient prescriptions on file prior to visit.   No current facility-administered medications on file prior to visit.    I have reviewed and updated the following as appropriate: allergies and current medications SHx: current smoker  Healthcare maintenance: up to date on pap and mammogram  Review of Systems See HPI    Objective:   Physical Exam BP 169/89  Pulse 54  Temp(Src) 98 F (36.7 C) (Oral)  Ht 5\' 7"  (1.702 m)  Wt 315 lb (142.883 kg)  BMI 49.32 kg/m2 Gen: alert, cooperative, NAD HEENT: AT/[place], sclera white, MMM Neck: supple CV: RRR, no murmurs Pulm: CTAB, no wheezes or rales Ext: trace edema to mid shin bilaterally     Assessment & Plan:  Health Care Maintenance: Discussed colonoscopy extensively with the patient.  I contacted [facility] and confirmed that the orange card does not cover screening colonoscopy.  I also offered her FOBT as an alternative which she declined.  She does have anemia, but it is not microcytic and no evidence for iron deficiency.  I provided her with the phone numbers of the GI offices in town and she can contact them directly to see about arranging a screening colonoscopy.

## 2013-05-15 NOTE — Assessment & Plan Note (Signed)
Elevated today, but has not taken medication yet this morning. Well controlled the last 2 visits. Refilled metoprolol for 1 year.

## 2013-07-14 ENCOUNTER — Ambulatory Visit (INDEPENDENT_AMBULATORY_CARE_PROVIDER_SITE_OTHER): Payer: No Typology Code available for payment source | Admitting: Family Medicine

## 2013-07-14 ENCOUNTER — Encounter: Payer: Self-pay | Admitting: Family Medicine

## 2013-07-14 ENCOUNTER — Ambulatory Visit: Payer: No Typology Code available for payment source

## 2013-07-14 VITALS — BP 141/89 | HR 78 | Temp 97.8°F | Resp 20 | Wt 310.0 lb

## 2013-07-14 DIAGNOSIS — B309 Viral conjunctivitis, unspecified: Secondary | ICD-10-CM

## 2013-07-14 MED ORDER — OLOPATADINE HCL 0.2 % OP SOLN: 1.0000 [drp] | Freq: Every day | OPHTHALMIC | Status: DC

## 2013-07-14 NOTE — Progress Notes (Signed)
   Subjective:    Patient ID: Tammy [name], female    DOB: Jul 12, 1955, 58 y.o.   MRN: [id number]  HPI 58 y/o female with 5 day history of cough and cold, she states that these symptoms have resolved (rhinorrhea, sore throat, cough), now has eye pain with drainage, eyes are red, no associated fevers or chills, using warm compresses to remove "mucus", has not been using any otc eye drops   Review of Systems  Constitutional: Negative for chills and fatigue.  Eyes: Positive for pain, redness and itching.  Respiratory: Negative for cough, choking and shortness of breath.   Cardiovascular: Negative for chest pain.  Gastrointestinal: Negative for nausea, vomiting and diarrhea.       Objective:   Physical Exam Vital: reviewed Gen: pleasant AAF, NAD HEENT: normocephalic, PERRL, EOMI, mild scleral erythema bilaterally, no icterus, rhinorrhea present, MMM, uvula midline, no pharyngeal erythema, neck supple, no cervical adenopathy Cardiac: RRR, S1 and S2 present, no murmurs, no heaves/thrills Resp: CTAB, normal effort     Assessment & Plan:  Please see problem specific assessment and plan.

## 2013-07-14 NOTE — Patient Instructions (Addendum)
Viral Conjunctivitis Conjunctivitis is an irritation (inflammation) of the clear membrane that covers the white part of the eye (the conjunctiva). The irritation can also happen on the underside of the eyelids. Conjunctivitis makes the eye red or pink in color. This is what is commonly known as pink eye. Viral conjunctivitis can spread easily (contagious). CAUSES   Infection from virus on the surface of the eye.  Infection from the irritation or injury of nearby tissues such as the eyelids or cornea.  More serious inflammation or infection on the inside of the eye.  Other eye diseases.  The use of certain eye medications. SYMPTOMS  The normally white color of the eye or the underside of the eyelid is usually pink or red in color. The pink eye is usually associated with irritation, tearing and some sensitivity to light. Viral conjunctivitis is often associated with a clear, watery discharge. If a discharge is present, there may also be some blurred vision in the affected eye. DIAGNOSIS  Conjunctivitis is diagnosed by an eye exam. The eye specialist looks for changes in the surface tissues of the eye which take on changes characteristic of the specific types of conjunctivitis. A sample of any discharge may be collected on a Q-Tip (sterile swap). The sample will be sent to a lab to see whether or not the inflammation is caused by bacterial or viral infection. TREATMENT  Viral conjunctivitis will not respond to medicines that kill germs (antibiotics). Treatment is aimed at stopping a bacterial infection on top of the viral infection. The goal of treatment is to relieve symptoms (such as itching) with antihistamine drops or other eye medications.  HOME CARE INSTRUCTIONS   To ease discomfort, apply a cool, clean wash cloth to your eye for 10 to 20 minutes, 3 to 4 times a day.  Gently wipe away any drainage from the eye with a warm, wet washcloth or a cotton ball.  Wash your hands often with soap  and use paper towels to dry.  Do not share towels or washcloths. This may spread the infection.  Change or wash your pillowcase every day.  You should not use eye make-up until the infection is gone.  Stop using contacts lenses. Ask your eye professional how to sterilize or replace them before using again. This depends on the type of contact lenses used.  Do not touch the edge of the eyelid with the eye drop bottle or ointment tube when applying medications to the affected eye. This will stop you from spreading the infection to the other eye or to others. SEEK IMMEDIATE MEDICAL CARE IF:   The infection has not improved within 3 days of beginning treatment.  A watery discharge from the eye develops.  Pain in the eye increases.  The redness is spreading.  Vision becomes blurred.  An oral temperature above 102 F (38.9 C) develops, or as your caregiver suggests.  Facial pain, redness or swelling develops.  Any problems that may be related to the prescribed medicine develop. MAKE SURE YOU:   Understand these instructions.  Will watch your condition.  Will get help right away if you are not doing well or get worse. Document Released: 02/16/2005 Document Revised: 05/11/2011 Document Reviewed: 10/06/2007 [facility] Patient Information 2014 [place].   Attempt trial of plain eye drops to flush your eyes, you may also try Visine anti-itch eye drops, I have sent a prescription for Pataday drops to your pharmacy (these may be expensive so check with pharmacist before buying).

## 2013-07-14 NOTE — Assessment & Plan Note (Signed)
Patient has bilateral viral conjunctivitis -conservative management discussed including warm compresses and otc eye drops -Prescription for Pataday provided to help with itching

## 2013-08-16 ENCOUNTER — Telehealth: Payer: Self-pay | Admitting: Emergency Medicine

## 2013-08-16 NOTE — Telephone Encounter (Signed)
Need a note for her job that states that she is not able to be in direct sunlight for extended periods due to the type of blood pressure medication she's taking. Please let her know when ready for pickup.  Need this as soon as possible.

## 2013-08-16 NOTE — Telephone Encounter (Signed)
I have printed a work note and placed it up front for pick up.

## 2013-09-27 ENCOUNTER — Ambulatory Visit: Payer: No Typology Code available for payment source

## 2013-11-14 ENCOUNTER — Ambulatory Visit (INDEPENDENT_AMBULATORY_CARE_PROVIDER_SITE_OTHER): Payer: No Typology Code available for payment source | Admitting: Family Medicine

## 2013-11-14 ENCOUNTER — Encounter: Payer: Self-pay | Admitting: Family Medicine

## 2013-11-14 VITALS — BP 145/90 | HR 56 | Temp 97.7°F | Wt 325.0 lb

## 2013-11-14 DIAGNOSIS — M79609 Pain in unspecified limb: Secondary | ICD-10-CM

## 2013-11-14 DIAGNOSIS — M545 Low back pain, unspecified: Secondary | ICD-10-CM

## 2013-11-14 DIAGNOSIS — M6530 Trigger finger, unspecified finger: Secondary | ICD-10-CM | POA: Insufficient documentation

## 2013-11-14 DIAGNOSIS — I10 Essential (primary) hypertension: Secondary | ICD-10-CM

## 2013-11-14 DIAGNOSIS — M79644 Pain in right finger(s): Secondary | ICD-10-CM

## 2013-11-14 LAB — BASIC METABOLIC PANEL
BUN: 26 mg/dL — AB (ref 6–23)
CALCIUM: 9.2 mg/dL (ref 8.4–10.5)
CO2: 25 mEq/L (ref 19–32)
Chloride: 102 mEq/L (ref 96–112)
Creat: 1.05 mg/dL (ref 0.50–1.10)
Glucose, Bld: 106 mg/dL — ABNORMAL HIGH (ref 70–99)
Potassium: 4.1 mEq/L (ref 3.5–5.3)
Sodium: 136 mEq/L (ref 135–145)

## 2013-11-14 LAB — LIPID PANEL
CHOL/HDL RATIO: 3.9 ratio
Cholesterol: 220 mg/dL — ABNORMAL HIGH (ref 0–200)
HDL: 57 mg/dL (ref >39)
LDL Cholesterol: 138 mg/dL — ABNORMAL HIGH (ref 0–99)
TRIGLYCERIDES: 123 mg/dL (ref <150)
VLDL: 25 mg/dL (ref 0–40)

## 2013-11-14 LAB — POCT GLYCOSYLATED HEMOGLOBIN (HGB A1C): HEMOGLOBIN A1C: 5.4

## 2013-11-14 NOTE — Assessment & Plan Note (Signed)
Likely OA.  Advised patient to use OTC aspercreme and thumb spica brace for comfort.  Scheduled Tylenol  q6h.  Occasional aleve use (no more than 3 times weekly) allowed.  BMET today to eval kidney fxn.

## 2013-11-14 NOTE — Progress Notes (Signed)
Subjective:   Tammy [name] is a 58 y.o. female with a history of HTN and low back pain here for BP f/u, R thumb pain, and LBP.  1. HTN: Has not taken medicien this AM.  On Metoprolol  BID.  H/o AKI with lisinopril.  Does not measure BP at home.  Denies CP, SOB, HA, blurry vision.  2. R thumb pain: x1 month. Painful around palmar aspect of MCP of R thumb.  Feels as though it is too painful to move and grasp objects.  She has tried Tylenol and soaking it in hot water, which have marginally helped.  3. LBP: H/o trauma >3 years ago with  Falling and hitting back on corner of concrete stair.  LBP is intermittent but has been bothering her again for the last ~2 months.  Aleve used to help back pain but she was told not to take it after AKI.  Tylenol has been marginally helping.  No fevers, chills, trouble with urination, weight loss, numbness or weakness in LEs.   Review of Systems:  Per HPI. All other systems reviewed and are negative.    Past Medical History: Patient Active Problem List   Diagnosis Date Noted  . Thumb pain 11/14/2013  . Viral conjunctivitis 07/14/2013  . Carpal tunnel syndrome 01/25/2013  . Right leg numbness 03/11/2012  . Left knee pain 03/11/2012  . Low back pain 07/10/2010  . TRIGGER FINGER, LEFT MIDDLE 04/01/2010  . HYPERLIPIDEMIA, MILD 12/23/2009  . UNSPECIFIED ANEMIA 06/07/2007  . OBESITY, NOS 04/29/2006  . TOBACCO DEPENDENCE 04/29/2006  . HYPERTENSION, BENIGN SYSTEMIC 04/29/2006    Medications: reviewed and updated Current Outpatient Prescriptions  Medication Sig Dispense Refill  . metoprolol (LOPRESSOR) 100 MG tablet Take 1 tablet (100 mg total) by mouth 2 (two) times daily.  180 tablet  3  . Olopatadine HCl (PATADAY) 0.2 % SOLN Apply 1 drop to eye daily.  2.5 mL  0   No current facility-administered medications for this visit.    Social History: current smoker  Objective:  BP 145/90  Pulse 56  Temp(Src) 97.7 F (36.5 C) (Oral)  Wt 325  lb (147.419 kg)  Gen:  58 y.o. female in NAD HEENT: NCAT, MMM, EOMI, anicteric sclerae CV: RRR, no MRG, no JVD Resp: Non-labored, CTAB, no wheezes noted Abd: Soft, NTND, BS present, no guarding or organomegaly Ext: WWP, no edema MSK: Full ROM, strength intact in LEs.  R thumb ROM and strength limited by pain.  TTP over palmar aspect of R MCP of thumb.  No swelling appreciated.  Neuro: Alert and oriented, speech normal. snesation intact.      Chemistry      Component Value Date/Time   NA 140 11/28/2012 1550   K 4.1 11/28/2012 1550   CL 109 11/28/2012 1550   CO2 25 11/28/2012 1550   BUN 21 11/28/2012 1550   CREATININE 1.11* 11/28/2012 1550   CREATININE 0.85 05/29/2010 1100      Component Value Date/Time   CALCIUM 9.4 11/28/2012 1550   ALKPHOS 86 11/02/2008 2007   AST 14 11/02/2008 2007   ALT 9 11/02/2008 2007   BILITOT 0.5 11/02/2008 2007      Lab Results  Component Value Date   WBC 4.7 11/28/2012   HGB 10.5* 11/28/2012   HCT 32.1* 11/28/2012   MCV 81.7 11/28/2012   PLT 221 11/28/2012   Lab Results  Component Value Date   TSH 2.424 11/28/2012   Lab Results  Component Value Date  HGBA1C 5.4 11/14/2013   Assessment:     Tammy [name] is a 58 y.o. female here for HTN f/u, R thumb pain, and LBP.    Plan:     See problem list for problem-specific plans.   Shirlee Latch, MD PGY-1,  [facility] 11/14/2013  12:41 PM

## 2013-11-14 NOTE — Patient Instructions (Addendum)
It was nice to meet you today!  I think that your back pain and thumb pain are both related to arthritis.  You can use OTC aspercreme to give some topical relief for your thumb.  Take Tylenol  every 6 hours regularly to help with arthritis pain.  You can take Aleve sparingly (no more than 3 times weekly).  I am also referring you to physical therapy to learn back exercises.  I am getting labs today and will call you or write you a letter with the results  Soon.    Best, Dr. Beryle Flock (Dr. B)  Osteoarthritis Osteoarthritis is a disease that causes soreness and inflammation of a joint. It occurs when the cartilage at the affected joint wears down. Cartilage acts as a cushion, covering the ends of bones where they meet to form a joint. Osteoarthritis is the most common form of arthritis. It often occurs in older people. The joints affected most often by this condition include those in the:  Ends of the fingers.  Thumbs.  Neck.  Lower back.  Knees.  Hips. CAUSES  Over time, the cartilage that covers the ends of bones begins to wear away. This causes bone to rub on bone, producing pain and stiffness in the affected joints.  RISK FACTORS Certain factors can increase your chances of having osteoarthritis, including:  Older age.  Excessive body weight.  Overuse of joints.  Previous joint injury. SIGNS AND SYMPTOMS   Pain, swelling, and stiffness in the joint.  Over time, the joint may lose its normal shape.  Small deposits of bone (osteophytes) may grow on the edges of the joint.  Bits of bone or cartilage can break off and float inside the joint space. This may cause more pain and damage. DIAGNOSIS  Your health care provider will do a physical exam and ask about your symptoms. Various tests may be ordered, such as:  X-rays of the affected joint.  An MRI scan.  Blood tests to rule out other types of arthritis.  Joint fluid tests. This involves using a needle to  draw fluid from the joint and examining the fluid under a microscope. TREATMENT  Goals of treatment are to control pain and improve joint function. Treatment plans may include:  A prescribed exercise program that allows for rest and joint relief.  A weight control plan.  Pain relief techniques, such as:  Properly applied heat and cold.  Electric pulses delivered to nerve endings under the skin (transcutaneous electrical nerve stimulation [TENS]).  Massage.  Certain nutritional supplements.  Medicines to control pain, such as:  Acetaminophen.  Nonsteroidal anti-inflammatory drugs (NSAIDs), such as naproxen.  Narcotic or central-acting agents, such as tramadol.  Corticosteroids. These can be given orally or as an injection.  Surgery to reposition the bones and relieve pain (osteotomy) or to remove loose pieces of bone and cartilage. Joint replacement may be needed in advanced states of osteoarthritis. HOME CARE INSTRUCTIONS   Take medicines only as directed by your health care provider.  Maintain a healthy weight. Follow your health care provider's instructions for weight control. This may include dietary instructions.  Exercise as directed. Your health care provider can recommend specific types of exercise. These may include:  Strengthening exercises. These are done to strengthen the muscles that support joints affected by arthritis. They can be performed with weights or with exercise bands to add resistance.  Aerobic activities. These are exercises, such as brisk walking or low-impact aerobics, that get your heart pumping.  Range-of-motion activities. These keep your joints limber.  Balance and agility exercises. These help you maintain daily living skills.  Rest your affected joints as directed by your health care provider.  Keep all follow-up visits as directed by your health care provider. SEEK MEDICAL CARE IF:   Your skin turns red.  You develop a rash in  addition to your joint pain.  You have worsening joint pain.  You have a fever along with joint or muscle aches. SEEK IMMEDIATE MEDICAL CARE IF:  You have a significant loss of weight or appetite.  You have night sweats. FOR MORE INFORMATION   National Institute of Arthritis and Musculoskeletal and Skin Diseases: www.niams.http://www.myers.net/  [employer] on Aging: https://walker.com/  American College of Rheumatology: www.rheumatology.org Document Released: 02/16/2005 Document Revised: 07/03/2013 Document Reviewed: 10/24/2012 [facility] Patient Information 2015 [place], [place]. This information is not intended to replace advice given to you by your health care provider. Make sure you discuss any questions you have with your health care provider.

## 2013-11-14 NOTE — Assessment & Plan Note (Signed)
Subacute with remote h/o trauma.  No red flags. No incontinence, numbness/wekness.  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    - Scheduled Tylenol  q6h.  Occasional aleve use (no more than 3 times weekly) allowed.  BMET today to eval kidney fxn. - No need for imaging given time course. - Referral to PT.

## 2013-11-14 NOTE — Assessment & Plan Note (Signed)
Elevated today, but has not taken meds this AM.  Goal < 140/90.  Pt wary to try other antihypertensives.  Continue Metoprolol  BID.  F/u in 1 month.  Lipid panel and A1c today.

## 2013-11-15 ENCOUNTER — Telehealth: Payer: Self-pay | Admitting: Family Medicine

## 2013-11-15 NOTE — Telephone Encounter (Signed)
Called patient to review lab results.  A1c wnl.  Lipids elevated with 10 yr ASCVD risk of 16%.  Would like to start Atorvastatin  at next visit in 1 month.  In the meantime, patient will work on exercising more and eaating less fried foods.  Will also re-evaluate BP in 1 month.  Shirlee Latch, MD, MPH PGY-1,  [facility] 11/15/2013  2:24 PM

## 2013-11-17 ENCOUNTER — Encounter: Payer: Self-pay | Admitting: Family Medicine

## 2013-11-17 ENCOUNTER — Ambulatory Visit (INDEPENDENT_AMBULATORY_CARE_PROVIDER_SITE_OTHER): Payer: Self-pay | Admitting: Family Medicine

## 2013-11-17 VITALS — BP 111/76 | HR 59 | Ht 66.0 in | Wt 325.0 lb

## 2013-11-17 DIAGNOSIS — M79609 Pain in unspecified limb: Secondary | ICD-10-CM

## 2013-11-17 DIAGNOSIS — M1811 Unilateral primary osteoarthritis of first carpometacarpal joint, right hand: Secondary | ICD-10-CM

## 2013-11-17 DIAGNOSIS — M19049 Primary osteoarthritis, unspecified hand: Secondary | ICD-10-CM

## 2013-11-17 DIAGNOSIS — M79644 Pain in right finger(s): Secondary | ICD-10-CM

## 2013-11-17 MED ORDER — METHYLPREDNISOLONE ACETATE 40 MG/ML IJ SUSP
40.0000 mg | Freq: Once | INTRAMUSCULAR | Status: AC
  Administered 2013-11-17: 40 mg via INTRA_ARTICULAR

## 2013-11-17 NOTE — Assessment & Plan Note (Signed)
-  Patient reported relief of symptoms following right thumb IP injection today -Post injection care as were discussed, including ice, and anti-inflammatory medications -She will followup in 2 weeks for reevaluation or sooner if needed.

## 2013-11-17 NOTE — Progress Notes (Signed)
   Subjective:    Patient ID: Tammy [name], female    DOB: [birth year], 58 y.o.   MRN: [id number]  HPI Tammy [name] is a 58 year old right-hand-dominant female who presents with right thumb pain at the request of Dr. Connye Burkitt. Onset of symptoms was approximately one month ago without any known acute injury. She has noticed increasing pain and stiffness throughout the right thumb. Location of pain is primarily at the right thumb IP joint (primarily), MCP joint, and CMC joint. She has associated limited range of motion due to pain of the thumb, as she cannot fully flex her thumb. 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 Symptoms are aggravated with movement and relieved with rest. She has tried Tylenol and Aleve. She did not obtained the thumb spica splint that was recommended at her last visit. She denies any associated numbness, tingling, weakness, rash, fevers, chills or swelling.  Past medical, social, medications, and allergies were reviewed and are up-to-date in the chart. Review of Systems As per history of present illness, otherwise a point review of systems was performed and is otherwise negative.    Objective:   Physical Exam BP 111/76  Pulse 59  Ht  (1.676 m)  Wt 325 lb (147.419 kg)  BMI 52.48 kg/m2 GEN: The patient is well-developed well-nourished female and in no acute distress.  She is awake alert and oriented x3. SKIN: warm and well-perfused, no rash  EXTR: No upper extremity edema Neuro: Strength 5/5 globally. Sensation intact throughout. No focal deficits. Vasc: +2 bilateral distal pulses. No edema.  MSK: Examination of the right hand and wrist reveals full range of motion at the wrist without pain. She has no snuffbox tenderness. She is no overlying swelling, erythema, or induration. She has tenderness with passive flexion at the right thumb IP joint. She has no tenderness extending along the APL, EPL, or EPB tendons.   Limited musculoskeletal  ultrasound: Long and short axis views were obtained of the right thumb. There appears to be some narrowing and osteophyte formation at the right IP joint, as well as over the MCP, and CMC joints. There is minimal surrounding edema. The most tenderness is elicited with palpation over the right IP joint. The second compartment of the wrist appears normal tendon appearance with minimal surrounding fluid.  Procedure: After obtaining informed, verbal consent the patient's skin was cleansed with alcohol and Betadine.  Subsequently the patient was injected with 40 mg Depo-Medrol and 1cc of 1% lidocaine plain into the right IP joint under ultrasound visualization of proper needle placement.  The medication was visualized as filling the joint capsule. The patient tolerated procedure.  No complications.  Prior to the procedure risks benefits and treatment alternatives were discussed.     Assessment & Plan:  Please see problem based assessment and plan in the problem list.

## 2013-12-01 ENCOUNTER — Ambulatory Visit (INDEPENDENT_AMBULATORY_CARE_PROVIDER_SITE_OTHER): Payer: No Typology Code available for payment source | Admitting: Family Medicine

## 2013-12-01 ENCOUNTER — Encounter: Payer: Self-pay | Admitting: Family Medicine

## 2013-12-01 VITALS — BP 167/85 | Ht 66.0 in | Wt 325.0 lb

## 2013-12-01 DIAGNOSIS — M79644 Pain in right finger(s): Secondary | ICD-10-CM

## 2013-12-01 MED ORDER — METHYLPREDNISOLONE ACETATE 40 MG/ML IJ SUSP
40.0000 mg | Freq: Once | INTRAMUSCULAR | Status: AC
  Administered 2013-12-01: 40 mg via INTRA_ARTICULAR

## 2013-12-01 NOTE — Progress Notes (Signed)
   Subjective:    Patient ID: Tammy [name], female    DOB: 07-07-1955, 58 y.o.   MRN: [id number]  HPI Elnita MaxwellCheryl is a 58 year old right-hand-dominant female who presents for followup of right thumb pain. She says that the IP injection to the right thumb significantly helps much of her joint symptoms. She does however note worsening of right thumb pain with catching and snapping on the palmar aspect overlying the right thumb MCP joint. She has a history of a similar condition in the opposite hand at the third finger requiring trigger finger release surgery. Symptoms are aggravated with movement, and relieved with rest. She denies any associated numbness, tingling, or weakness.  Past medical history, social history, medications, and allergies were reviewed and are up to date in the chart.  Review of Systems 7 point review of systems was performed and was otherwise negative unless noted in the history of present illness.    Objective:   Physical Exam BP 167/85  Ht 5\' 6"  (1.676 m)  Wt 325 lb (147.419 kg)  BMI 52.48 kg/m2 GEN: The patient is well-developed well-nourished female and in no acute distress.  She is awake alert and oriented x3. SKIN: warm and well-perfused, no rash  Neuro: Strength 5/5 globally. Sensation intact throughout. DTRs 2/4 bilaterally. No focal deficits. Vasc: +2 bilateral distal pulses. No edema.  MSK: Examination of the right thumb reveals limited range of motion with flexion of the MCP joint and associated triggering palpated. There is no overlying erythema or induration.   Procedure: After obtaining informed, verbal consent the patient's skin was cleansed with alcohol and Betadine.  Subsequently she was injected with 40mg  methylprednisolone and 1cc of 1% lidocaine plain into the right thumb A1 pulley under ultrasound guidance. The medication was visualized filling the tendon sheath, but not into the tendon.  She tolerated procedure.  No complications.  Prior to the  procedure risks benefits and treatment alternatives were discussed.     Assessment & Plan:  Please see problem based assessment and plan in the problem list.

## 2013-12-01 NOTE — Assessment & Plan Note (Signed)
-  Patient tolerated the injection well today with good relief of symptoms. -Post injection care as were discussed, including icing and rest. -She will followup as needed. If her pain persists it may be reasonable to attempt a subsequent trigger finger injection, but ultimately if these did not relief her symptoms adequately she may be a good candidate for inferior finger release as this has helped in the contralateral hand.

## 2014-05-30 ENCOUNTER — Other Ambulatory Visit: Payer: Self-pay | Admitting: Family Medicine

## 2014-05-30 DIAGNOSIS — I10 Essential (primary) hypertension: Secondary | ICD-10-CM

## 2014-05-30 MED ORDER — METOPROLOL TARTRATE 100 MG PO TABS
100.0000 mg | ORAL_TABLET | Freq: Two times a day (BID) | ORAL | Status: DC
Start: 2014-05-30 — End: 2014-06-27

## 2014-05-30 NOTE — Telephone Encounter (Signed)
Pt called and needs a refill on her Metoprolol sent in. jw

## 2014-05-30 NOTE — Telephone Encounter (Signed)
Patient was supposed to f/u in October for HTN.  Will give 1 month supply, but needs f/u before more refills given.  Erasmo DownerAngela M Avaree Gilberti, MD, MPH PGY-1,  [facility] 05/30/2014 3:11 PM

## 2014-06-01 ENCOUNTER — Encounter: Payer: Self-pay | Admitting: Family Medicine

## 2014-06-01 ENCOUNTER — Ambulatory Visit (INDEPENDENT_AMBULATORY_CARE_PROVIDER_SITE_OTHER): Payer: Medicaid Other | Admitting: Family Medicine

## 2014-06-01 VITALS — BP 138/90 | HR 56 | Temp 98.6°F | Wt 345.0 lb

## 2014-06-01 DIAGNOSIS — Z23 Encounter for immunization: Secondary | ICD-10-CM

## 2014-06-01 DIAGNOSIS — R519 Headache, unspecified: Secondary | ICD-10-CM

## 2014-06-01 DIAGNOSIS — R51 Headache: Secondary | ICD-10-CM | POA: Diagnosis present

## 2014-06-01 MED ORDER — CYCLOBENZAPRINE HCL 5 MG PO TABS: 5.0000 mg | ORAL_TABLET | Freq: Three times a day (TID) | ORAL | Status: DC | PRN

## 2014-06-01 MED ORDER — KETOROLAC TROMETHAMINE 30 MG/ML IJ SOLN
30.0000 mg | Freq: Once | INTRAMUSCULAR | Status: AC
  Administered 2014-06-01: 30 mg via INTRAMUSCULAR

## 2014-06-01 NOTE — Patient Instructions (Signed)
I think this is a tension headache, worsened by some spasm in your neck  Heating pad Gentle neck stretching Flexeril (muscle relaxer)  Tylenol and aleve as needed (don't take aleve for 24 hours after your shot today)  Follow up next week if not improving  Be well, Dr. Pollie MeyerMcIntyre

## 2014-06-01 NOTE — Progress Notes (Signed)
Patient ID: Tammy [name], female   DOB: 07-20-1955, 59 y.o.   MRN: [id number]  HPI:  Pt presents for a same day appointment to discuss   HEADACHE  Headache started 2-3 weeks ago Location: from of head forehead and L neck (thinks sleeps weird on L side of neck) Medications tried: tried extra strength tylenol and aleve, both help but headache returns Head trauma: none Previous similar headaches: no Taking blood thinners: no History of cancer: none  Symptoms Nose congestion stuffiness:  none Nausea vomiting: no Photophobia: no Noise sensitivity: aggravating Double vision or loss of vision: none Fever: no Neck Stiffness: mild posterior neck pain Trouble walking or speaking: none  ROS: See HPI  PMFSH: History of hypertension, carpal tunnel syndrome, tobacco abuse, hyperlipidemia  PHYSICAL EXAM: BP 138/90 mmHg  Pulse 56  Temp(Src) 98.6 F (37 C) (Oral)  Wt 345 lb (156.491 kg) Gen: no acute distress, pleasant, cooperative HEENT: Normocephalic, atraumatic. No meningeal signs, but mildly decreased range of motion of neck due to left neck spasm. Left posterior neck with mild tenderness to palpation. Heart: Regular rate and rhythm, no murmur Lungs: Clear to auscultation bilaterally Neuro: Cranial nerves II through XII tested and intact. Finger-nose-finger is normal bilaterally. Negative Romberg. Sensation intact over entirety of body. Full-strength in bilateral upper and lower extremities.  ASSESSMENT/PLAN:  1. Headache: Suspect this is a tension headache related to muscle spasm of her neck. No red flags and normal neuro exam today. -Patient was given a Toradol shot to help alleviate her symptoms. Counseled not to take any NSAIDs for 24 hours after Toradol shot. -I also prescribed Flexeril for her.  -Heating pad, gentle neck stretching -Continue Tylenol as needed.  -She will follow-up if she is not improving next week.   FOLLOW UP: F/u as needed if symptoms worsen or do not  improve.   GrenadaBrittany J. Pollie MeyerMcIntyre, MD [facility]

## 2014-06-04 NOTE — Progress Notes (Signed)
I was available as preceptor to resident for this patient's office visit.  

## 2014-06-05 NOTE — Telephone Encounter (Signed)
Has appt on 06/27/14 [name], Tammy RochesterJessica Dawn

## 2014-06-27 ENCOUNTER — Encounter: Payer: Self-pay | Admitting: Family Medicine

## 2014-06-27 ENCOUNTER — Ambulatory Visit (INDEPENDENT_AMBULATORY_CARE_PROVIDER_SITE_OTHER): Payer: Medicaid Other | Admitting: Family Medicine

## 2014-06-27 VITALS — BP 172/92 | HR 68 | Temp 98.4°F | Ht 66.0 in | Wt 348.0 lb

## 2014-06-27 DIAGNOSIS — M5442 Lumbago with sciatica, left side: Secondary | ICD-10-CM

## 2014-06-27 DIAGNOSIS — I10 Essential (primary) hypertension: Secondary | ICD-10-CM | POA: Diagnosis not present

## 2014-06-27 DIAGNOSIS — H938X3 Other specified disorders of ear, bilateral: Secondary | ICD-10-CM | POA: Diagnosis not present

## 2014-06-27 MED ORDER — NAPROXEN SODIUM 220 MG PO TABS: 220.0000 mg | ORAL_TABLET | Freq: Two times a day (BID) | ORAL | Status: DC

## 2014-06-27 MED ORDER — METOPROLOL TARTRATE 100 MG PO TABS: 100.0000 mg | ORAL_TABLET | Freq: Two times a day (BID) | ORAL | Status: DC

## 2014-06-27 MED ORDER — AMLODIPINE BESYLATE 5 MG PO TABS: 5.0000 mg | ORAL_TABLET | Freq: Every day | ORAL | Status: DC

## 2014-06-27 NOTE — Patient Instructions (Addendum)
It was nice to see you again today.  Please continue taking the metoprolol for your blood pressure and start taking amlodipine daily as well.   For your back pain, you can take tylenol as needed.  Lets start taking 1 tablet of Aleve twice daily for better control of pain.  Do not take more than prescribed.  Come back to see me in 1 month for follow-up  Take care, Dr. B  Hypertension Hypertension, commonly called high blood pressure, is when the force of blood pumping through your arteries is too strong. Your arteries are the blood vessels that carry blood from your heart throughout your body. A blood pressure reading consists of a higher number over a lower number, such as 110/72. The higher number (systolic) is the pressure inside your arteries when your heart pumps. The lower number (diastolic) is the pressure inside your arteries when your heart relaxes. Ideally you want your blood pressure below 120/80. Hypertension forces your heart to work harder to pump blood. Your arteries may become narrow or stiff. Having hypertension puts you at risk for heart disease, stroke, and other problems.  RISK FACTORS Some risk factors for high blood pressure are controllable. Others are not.  Risk factors you cannot control include:   Race. You may be at higher risk if you are African American.  Age. Risk increases with age.  Gender. Men are at higher risk than women before age 72 years. After age 38, women are at higher risk than men. Risk factors you can control include:  Not getting enough exercise or physical activity.  Being overweight.  Getting too much fat, sugar, calories, or salt in your diet.  Drinking too much alcohol. SIGNS AND SYMPTOMS Hypertension does not usually cause signs or symptoms. Extremely high blood pressure (hypertensive crisis) may cause headache, anxiety, shortness of breath, and nosebleed. DIAGNOSIS  To check if you have hypertension, your health care provider will  measure your blood pressure while you are seated, with your arm held at the level of your heart. It should be measured at least twice using the same arm. Certain conditions can cause a difference in blood pressure between your right and left arms. A blood pressure reading that is higher than normal on one occasion does not mean that you need treatment. If one blood pressure reading is high, ask your health care provider about having it checked again. TREATMENT  Treating high blood pressure includes making lifestyle changes and possibly taking medicine. Living a healthy lifestyle can help lower high blood pressure. You may need to change some of your habits. Lifestyle changes may include:  Following the DASH diet. This diet is high in fruits, vegetables, and whole grains. It is low in salt, red meat, and added sugars.  Getting at least 2 hours of brisk physical activity every week.  Losing weight if necessary.  Not smoking.  Limiting alcoholic beverages.  Learning ways to reduce stress. If lifestyle changes are not enough to get your blood pressure under control, your health care provider may prescribe medicine. You may need to take more than one. Work closely with your health care provider to understand the risks and benefits. HOME CARE INSTRUCTIONS  Have your blood pressure rechecked as directed by your health care provider.   Take medicines only as directed by your health care provider. Follow the directions carefully. Blood pressure medicines must be taken as prescribed. The medicine does not work as well when you skip doses. Skipping doses also puts  you at risk for problems.   Do not smoke.   Monitor your blood pressure at home as directed by your health care provider. SEEK MEDICAL CARE IF:   You think you are having a reaction to medicines taken.  You have recurrent headaches or feel dizzy.  You have swelling in your ankles.  You have trouble with your vision. SEEK  IMMEDIATE MEDICAL CARE IF:  You develop a severe headache or confusion.  You have unusual weakness, numbness, or feel faint.  You have severe chest or abdominal pain.  You vomit repeatedly.  You have trouble breathing. MAKE SURE YOU:   Understand these instructions.  Will watch your condition.  Will get help right away if you are not doing well or get worse. Document Released: 02/16/2005 Document Revised: 07/03/2013 Document Reviewed: 12/09/2012 [facility] Patient Information 2015 [place]. This information is not intended to replace advice given to you by your health care provider. Make sure you discuss any questions you have with your health care provider.

## 2014-06-27 NOTE — Progress Notes (Signed)
   Subjective:   Tammy [name] is a 59 y.o. female with a history of HTN, chronic LBP here for f/u of HTN and LBP and ears popping.  HTN: - last seen in 9/15 for HTN, BP 145/90 at that time. - Taking metoprolol 100mg  BID for HTN - BP is nromal at some previous appts and elevated at others - Reports that she doesn't know if she took her medication this AM - needs BP med refilled - hasn't run out - Denies CP, SOB, HA, palpitations, LE edema  Low back pain: - was prescribed flexeril at beginning of month for neck spasms causing headaches - has run out but this was helping back pain - taking Tylenol BID - thinks Toradol helped but not taking ibuprofen - remote history of trauma with hitting back on stairs - no h/o kidney disease, GI bleed - pain goes down L leg intermittently  Ears popping: b/l - ~1 month time - worse with headaches earlier in month - intermittent - no other allergy symptoms, no h/o seasonal allergies - thinks it has stopped now but wanted to mention it  Review of Systems:  Per HPI. All other systems reviewed and are negative.   PMH, PSH, Medications, Allergies, and FmHx reviewed and updated in EMR.  Social History: current smoker - not interested in quitting  Objective:  BP 182/84 mmHg  Pulse 68  Temp(Src) 98.4 F (36.9 C) (Oral)  Ht 5\' 6"  (1.676 m)  Wt 348 lb (157.852 kg)  BMI 56.20 kg/m2  Gen:  59 y.o. female in NAD HEENT: NCAT, MMM, EOMI, PERRL, anicteric sclerae, OP clear, no nasal congestion, TMs clear b/l CV: RRR, no MRG, no JVD Resp: Non-labored, CTAB, no wheezes noted Ext: WWP, no edema MSK: Mild TTP diffusely over lower back, straight leg raise neg b/l Neuro: Alert and oriented, speech normal      Chemistry      Component Value Date/Time   NA 136 11/14/2013 1204   K 4.1 11/14/2013 1204   CL 102 11/14/2013 1204   CO2 25 11/14/2013 1204   BUN 26* 11/14/2013 1204   CREATININE 1.05 11/14/2013 1204   CREATININE 0.85 05/29/2010 1100      Component Value Date/Time   CALCIUM 9.2 11/14/2013 1204   ALKPHOS 86 11/02/2008 2007   AST 14 11/02/2008 2007   ALT 9 11/02/2008 2007   BILITOT 0.5 11/02/2008 2007      Lab Results  Component Value Date   WBC 4.7 11/28/2012   HGB 10.5* 11/28/2012   HCT 32.1* 11/28/2012   MCV 81.7 11/28/2012   PLT 221 11/28/2012   Lab Results  Component Value Date   TSH 2.424 11/28/2012   Lab Results  Component Value Date   HGBA1C 5.4 11/14/2013   Assessment:     Tammy [name] is a 59 y.o. female here for HTN, ear popping, LBP.    Plan:     See problem list for problem-specific plans.   Erasmo DownerAngela M Bacigalupo, MD PGY-1,  [facility] 06/27/2014  3:04 PM

## 2014-06-27 NOTE — Assessment & Plan Note (Signed)
Could be related to eustachian tube dysfunction the patient reports it is resolved Can try Flonase if recurs in the future

## 2014-06-27 NOTE — Progress Notes (Signed)
One of the available preceptor. 

## 2014-06-27 NOTE — Assessment & Plan Note (Signed)
Currently uncontrolled.  continue metoprolol 100 mg twice a day  will start amlodipine 5 mg daily -  There is noted above about patient not tolerating amlodipine because of headaches , the patient does not remember any side effects  Or taking this medicine so we will give it a try  Follow-up in one month

## 2014-06-27 NOTE — Assessment & Plan Note (Signed)
Chronic low back pain with remote history of trauma. No red flags  continue when necessary Tylenol Tylenol  start Aleve 1 tablet twice a day   patient unwilling to go to physical therapy No need for imaging during time course f/u in 1 month

## 2014-07-14 ENCOUNTER — Encounter (HOSPITAL_COMMUNITY): Payer: Self-pay | Admitting: *Deleted

## 2014-07-14 ENCOUNTER — Inpatient Hospital Stay (HOSPITAL_COMMUNITY)
Admission: EM | Admit: 2014-07-14 | Discharge: 2014-07-17 | DRG: 281 | Disposition: A | Discharge status: Home / Self Care | Payer: Medicaid Other | Attending: Family Medicine | Admitting: Family Medicine

## 2014-07-14 ENCOUNTER — Emergency Department (HOSPITAL_COMMUNITY): Payer: Medicaid Other

## 2014-07-14 DIAGNOSIS — R079 Chest pain, unspecified: Secondary | ICD-10-CM | POA: Diagnosis present

## 2014-07-14 DIAGNOSIS — Z6841 Body Mass Index (BMI) 40.0 and over, adult: Secondary | ICD-10-CM

## 2014-07-14 DIAGNOSIS — M25562 Pain in left knee: Secondary | ICD-10-CM

## 2014-07-14 DIAGNOSIS — K219 Gastro-esophageal reflux disease without esophagitis: Secondary | ICD-10-CM | POA: Diagnosis present

## 2014-07-14 DIAGNOSIS — I10 Essential (primary) hypertension: Secondary | ICD-10-CM

## 2014-07-14 DIAGNOSIS — F17210 Nicotine dependence, cigarettes, uncomplicated: Secondary | ICD-10-CM | POA: Diagnosis present

## 2014-07-14 DIAGNOSIS — I209 Angina pectoris, unspecified: Secondary | ICD-10-CM

## 2014-07-14 DIAGNOSIS — I739 Peripheral vascular disease, unspecified: Secondary | ICD-10-CM | POA: Diagnosis present

## 2014-07-14 DIAGNOSIS — I25110 Atherosclerotic heart disease of native coronary artery with unstable angina pectoris: Secondary | ICD-10-CM | POA: Diagnosis present

## 2014-07-14 DIAGNOSIS — Z720 Tobacco use: Secondary | ICD-10-CM

## 2014-07-14 DIAGNOSIS — E785 Hyperlipidemia, unspecified: Secondary | ICD-10-CM | POA: Insufficient documentation

## 2014-07-14 DIAGNOSIS — R778 Other specified abnormalities of plasma proteins: Secondary | ICD-10-CM | POA: Insufficient documentation

## 2014-07-14 DIAGNOSIS — I472 Ventricular tachycardia: Secondary | ICD-10-CM | POA: Diagnosis not present

## 2014-07-14 DIAGNOSIS — I214 Non-ST elevation (NSTEMI) myocardial infarction: Principal | ICD-10-CM | POA: Diagnosis present

## 2014-07-14 DIAGNOSIS — R7989 Other specified abnormal findings of blood chemistry: Secondary | ICD-10-CM | POA: Insufficient documentation

## 2014-07-14 LAB — I-STAT TROPONIN, ED: Troponin i, poc: 0 ng/mL (ref 0.00–0.08)

## 2014-07-14 LAB — CBC
HCT: 38.1 % (ref 36.0–46.0)
HEMOGLOBIN: 12.7 g/dL (ref 12.0–15.0)
MCH: 27.5 pg (ref 26.0–34.0)
MCHC: 33.3 g/dL (ref 30.0–36.0)
MCV: 82.5 fL (ref 78.0–100.0)
Platelets: 187 10*3/uL (ref 150–400)
RBC: 4.62 MIL/uL (ref 3.87–5.11)
RDW: 13.2 % (ref 11.5–15.5)
WBC: 5.2 10*3/uL (ref 4.0–10.5)

## 2014-07-14 LAB — BASIC METABOLIC PANEL
ANION GAP: 10 (ref 5–15)
BUN: 18 mg/dL (ref 6–20)
CO2: 23 mmol/L (ref 22–32)
Calcium: 9.3 mg/dL (ref 8.9–10.3)
Chloride: 103 mmol/L (ref 101–111)
Creatinine, Ser: 1.12 mg/dL — ABNORMAL HIGH (ref 0.44–1.00)
GFR calc Af Amer: >60 mL/min (ref >60)
GFR calc non Af Amer: 53 mL/min — ABNORMAL LOW (ref >60)
Glucose, Bld: 168 mg/dL — ABNORMAL HIGH (ref 65–99)
POTASSIUM: 4.1 mmol/L (ref 3.5–5.1)
SODIUM: 136 mmol/L (ref 135–145)

## 2014-07-14 LAB — BRAIN NATRIURETIC PEPTIDE: B Natriuretic Peptide: 21 pg/mL (ref 0.0–100.0)

## 2014-07-14 MED ORDER — METOPROLOL TARTRATE 100 MG PO TABS
100.0000 mg | ORAL_TABLET | Freq: Two times a day (BID) | ORAL | Status: DC
  Administered 2014-07-15 – 2014-07-17 (×5): 100 mg via ORAL
  Filled 2014-07-14 (×5): qty 1

## 2014-07-14 MED ORDER — NITROGLYCERIN 0.4 MG SL SUBL
0.4000 mg | SUBLINGUAL_TABLET | SUBLINGUAL | Status: DC | PRN
  Administered 2014-07-14: 0.4 mg via SUBLINGUAL
  Filled 2014-07-14: qty 1

## 2014-07-14 MED ORDER — ONDANSETRON HCL 4 MG/2ML IJ SOLN: 4.0000 mg | Freq: Four times a day (QID) | INTRAMUSCULAR | Status: DC | PRN

## 2014-07-14 MED ORDER — AMLODIPINE BESYLATE 5 MG PO TABS
5.0000 mg | ORAL_TABLET | Freq: Every day | ORAL | Status: DC
  Administered 2014-07-15 – 2014-07-17 (×3): 5 mg via ORAL
  Filled 2014-07-14 (×3): qty 1

## 2014-07-14 MED ORDER — NITROGLYCERIN 2 % TD OINT
0.5000 [in_us] | TOPICAL_OINTMENT | Freq: Once | TRANSDERMAL | Status: AC
  Administered 2014-07-14: 0.5 [in_us] via TOPICAL
  Filled 2014-07-14: qty 1

## 2014-07-14 MED ORDER — METOPROLOL TARTRATE 25 MG PO TABS
100.0000 mg | ORAL_TABLET | Freq: Once | ORAL | Status: AC
  Administered 2014-07-14: 100 mg via ORAL
  Filled 2014-07-14: qty 4

## 2014-07-14 MED ORDER — ATORVASTATIN CALCIUM 10 MG PO TABS
10.0000 mg | ORAL_TABLET | Freq: Every day | ORAL | Status: DC
  Administered 2014-07-15: 10 mg via ORAL
  Filled 2014-07-14: qty 1

## 2014-07-14 MED ORDER — HEPARIN SODIUM (PORCINE) 5000 UNIT/ML IJ SOLN: 5000.0000 [IU] | Freq: Three times a day (TID) | INTRAMUSCULAR | Status: DC

## 2014-07-14 MED ORDER — ASPIRIN 81 MG PO CHEW
324.0000 mg | CHEWABLE_TABLET | Freq: Once | ORAL | Status: AC
  Administered 2014-07-14: 324 mg via ORAL
  Filled 2014-07-14: qty 4

## 2014-07-14 MED ORDER — ACETAMINOPHEN 325 MG PO TABS: 650.0000 mg | ORAL_TABLET | ORAL | Status: DC | PRN

## 2014-07-14 MED ORDER — ASPIRIN EC 325 MG PO TBEC
325.0000 mg | DELAYED_RELEASE_TABLET | Freq: Every day | ORAL | Status: DC
  Administered 2014-07-15: 325 mg via ORAL
  Filled 2014-07-14: qty 1

## 2014-07-14 MED ORDER — OLOPATADINE HCL 0.1 % OP SOLN
1.0000 [drp] | Freq: Two times a day (BID) | OPHTHALMIC | Status: DC
Start: 2014-07-15 — End: 2014-07-17
  Filled 2014-07-14: qty 5

## 2014-07-14 MED ORDER — MORPHINE SULFATE 2 MG/ML IJ SOLN: 2.0000 mg | INTRAMUSCULAR | Status: DC | PRN

## 2014-07-14 MED ORDER — MORPHINE SULFATE 4 MG/ML IJ SOLN
4.0000 mg | Freq: Once | INTRAMUSCULAR | Status: AC
  Administered 2014-07-14: 4 mg via INTRAVENOUS
  Filled 2014-07-14: qty 1

## 2014-07-14 MED ORDER — GI COCKTAIL ~~LOC~~: 30.0000 mL | Freq: Four times a day (QID) | ORAL | Status: DC | PRN

## 2014-07-14 NOTE — ED Notes (Signed)
Admitting MD in room.

## 2014-07-14 NOTE — ED Notes (Signed)
Family at bedside. 

## 2014-07-14 NOTE — ED Provider Notes (Signed)
CSN: [id number]     Arrival date & time 07/14/14  1456 History   First MD Initiated Contact with Patient 07/14/14 1615     Chief Complaint  Patient presents with  . Chest Pain  . Shortness of Breath     (Consider location/radiation/quality/duration/timing/severity/associated sxs/prior Treatment) HPI Comments: Pt with morbid obesity and HTN comes in with chest tightness. Symptoms started at 2 pm, pt had just got back from grocery store and she had done a lot of lifting. Pain is describes as sharp, moderately severe with numbness in her L arm. She took aleve, and pain has eased up, but still present at 2/10. No nausea, sweating. Pt had some dib initially. No hx of CAD or chest pain. Pt has no hx of PE, DVT. Pt has no numbness anymore. Leading upto today, there has been no chest pain with exertion.  Patient is a 59 y.o. female presenting with chest pain and shortness of breath. The history is provided by the patient.  Chest Pain Associated symptoms: numbness and shortness of breath   Shortness of Breath Associated symptoms: chest pain     Past Medical History  Diagnosis Date  . Ulcer   . GERD (gastroesophageal reflux disease)   . Hypertension   . Abnormal mammogram 10.25.11    area of density with several adjacent amorphous calcifications located laterally w/in the right breast at approx 9-10 o'clock position likely represents area of evolving far necrosis; f/u mamo and U/S in 6 months  . Obesity    Past Surgical History  Procedure Laterality Date  . Cesarean section    . Upper gastrointestinal endoscopy  1991  . Trigger finger release Left     middle finger   Family History  Problem Relation Age of Onset  . Cancer Mother     Bladder cancer, cause of death  . Diabetes Father   . Hypertension Father   . Hypertension Sister   . Hypertension Brother   . Cancer Maternal Aunt   . Breast cancer Maternal Aunt   . Ovarian cancer Maternal Aunt    History  Substance Use Topics  .  Smoking status: Current Every Day Smoker -- 0.50 packs/day    Types: Cigarettes  . Smokeless tobacco: Never Used     Comment: does not want to quit  . Alcohol Use: No   OB History    No data available     Review of Systems  Respiratory: Positive for shortness of breath.   Cardiovascular: Positive for chest pain.  Neurological: Positive for numbness.  All other systems reviewed and are negative.     Allergies  Ace inhibitors and Hydrochlorothiazide  Home Medications   Prior to Admission medications   Medication Sig Start Date End Date Taking? Authorizing Provider  amLODipine (NORVASC) 5 MG tablet Take 1 tablet (5 mg total) by mouth daily. 06/27/14  Yes Erasmo DownerAngela M Bacigalupo, MD  metoprolol (LOPRESSOR) 100 MG tablet Take 1 tablet (100 mg total) by mouth 2 (two) times daily. 06/27/14  Yes Erasmo DownerAngela M Bacigalupo, MD  naproxen sodium (ALEVE) 220 MG tablet Take 1 tablet (220 mg total) by mouth 2 (two) times daily with a meal. Patient taking differently: Take 220 mg by mouth 2 (two) times daily as needed.  06/27/14  Yes Erasmo DownerAngela M Bacigalupo, MD  cyclobenzaprine (FLEXERIL) 5 MG tablet Take 1 tablet (5 mg total) by mouth 3 (three) times daily as needed for muscle spasms. Patient not taking: Reported on 07/14/2014 06/01/14   GrenadaBrittany  Costella HatcherJ McIntyre, MD  Olopatadine HCl (PATADAY) 0.2 % SOLN Apply 1 drop to eye daily. Patient not taking: Reported on 07/14/2014 07/14/13   Uvaldo RisingKyle J Fletke, MD   BP 149/95 mmHg  Pulse 60  Temp(Src) 97.6 F (36.4 C) (Oral)  Resp 22  Ht 5\' 6"  (1.676 m)  Wt 348 lb (157.852 kg)  BMI 56.20 kg/m2  SpO2 97% Physical Exam  Constitutional: She is oriented to person, place, and time. She appears well-developed and well-nourished.  HENT:  Head: Normocephalic and atraumatic.  Eyes: EOM are normal. Pupils are equal, round, and reactive to light.  Neck: Neck supple.  Cardiovascular: Normal rate, regular rhythm and normal heart sounds.   No murmur heard. Pulmonary/Chest: Effort  normal. No respiratory distress.  Abdominal: Soft. She exhibits no distension. There is no tenderness. There is no rebound and no guarding.  Musculoskeletal: She exhibits no edema or tenderness.  Neurological: She is alert and oriented to person, place, and time.  Skin: Skin is warm and dry.  Nursing note and vitals reviewed.   ED Course  Procedures (including critical care time) Labs Review Labs Reviewed  BASIC METABOLIC PANEL - Abnormal; Notable for the following:    Glucose, Bld 168 (*)    Creatinine, Ser 1.12 (*)    GFR calc non Af Amer 53 (*)    All other components within normal limits  CBC  BRAIN NATRIURETIC PEPTIDE  I-STAT TROPOININ, ED    Imaging Review Dg Chest 2 View  07/14/2014   CLINICAL DATA:  Mid chest pain  EXAM: CHEST  2 VIEW  COMPARISON:  12/26/2009  FINDINGS: Lungs are adequately inflated without consolidation or effusion. Cardiomediastinal silhouette is within normal. There are degenerative changes of the thoracic spine.  IMPRESSION: No active cardiopulmonary disease.   Electronically Signed   By: Elberta Fortisaniel  Boyle M.D.   On: 07/14/2014 15:54     EKG Interpretation   Date/Time:  Saturday Jul 14 2014 15:03:49 EDT Ventricular Rate:  59 PR Interval:  170 QRS Duration: 90 QT Interval:  404 QTC Calculation: 399 R Axis:   -24 Text Interpretation:  Sinus bradycardia ST \\T \ T wave abnormality,  consider inferior ischemia Abnormal ECG No significant change since last  tracing Confirmed by Rhunette CroftNANAVATI, MD, Janey GentaANKIT [phone] on 07/14/2014 9:25:45 PM      MDM   Final diagnoses:  Angina pectoris    Pt comes in with cc of chest pain. Misternal, pressure like pain with L arm numbness after she had done some lifting. HEART score is 5 - and so we will admit her for further workup and risk stratification. Pt has mild chest pain currently, will give nitro and see if she has any response. Full dose aspirin given.  History Highly suspicious 2   ECG  Non specific  repolarisation disturbance 1   Age  58 - 65 years 1    Risk Factors  1 or 2 risk factors 1   Troponin  ? normal limit 0       Derwood KaplanAnkit Cyrus Ramsburg, MD 07/14/14 2223

## 2014-07-14 NOTE — H&P (Signed)
[facility] Ad[facility] History and Physical Service Pager: [phone]  Patient name: Tammy [name] [facility] Medical record number: [id number] Date of birth: 14-Jul-1955 Age: 59 y.o. Gender: female  Primary Care Provider: Shirlee LatchBacigalupo, [name], MD Consultants: None Code Status: Full  Chief Complaint: Chest Pain  Assessment and Plan: Tammy [name] Tammy [name] is [name] 59 y.o. female presenting with chest pain. PMH is significant for HTN, HLD, and tobacco abuse.   Chest Pain, ACS r/o. Heart score of 5 on admission. Risk factors include HTN, HLD, and tobacco abuse. Story is concerning for cardiac etiology (started with exertion, relieved with nitroglycerin). Initial EKG with nonspecific ST depression in II and III. Troponin negative. - Admitted to telemetry under attending Dr Randolm IdolFletke - Trend troponins x3 - EKG in AM - Obtain TSH, A1c, FLP - Consider cardiology consult in AM - Start ASA and statin here.   HTN. At goal - Continue amlodipine and metoprolol  HLD. Not on statin. FLP last year: Total cholesterol 220, TG 123, HDL 57, LDL 138 - Start statin here - Obtain FLP  Tobacco Abuse - Encourage smoking cessation  Morbid obesity: patient has no plans on losing weight   FEN/GI: SLIV, heart healthy diet Prophylaxis: Sub Q heparin  Disposition: Admitted to telemetry under attending Dr Randolm IdolFletke pending above management.   History of Present Illness: Tammy [name] Tammy [name] is [name] 59 y.o. female presenting with chest pain.  Patient reports that she was carrying groceries this afternoon when she noticed chest tightness and numbness that radiated into her left. Patient was worried that she was having an MI or stroke so took 2 Alleve tablets. Pain persisted and the patient presented to the ED. Patient endorsed some dyspnea associated with the chest pain. No nausea or vomiting. No diaphoresis. No LE edema.  In the ED, patient was given 4mg  of morphine and sublingual nitroglycerin. Patient  reported that her chest pain had essentially resolved with these interventions and denied any chest pain at the time of admission.   Review Of Systems: Per HPI, otherwise 12 point review of systems was performed and was unremarkable.  Patient Active Problem List   Diagnosis Date Noted  . Popping of both ears 06/27/2014  . Osteoarthritis of right thumb 11/17/2013  . Trigger finger of right hand 11/14/2013  . Viral conjunctivitis 07/14/2013  . Carpal tunnel syndrome 01/25/2013  . Right leg numbness 03/11/2012  . Left knee pain 03/11/2012  . Low back pain 07/10/2010  . TRIGGER FINGER, LEFT MIDDLE 04/01/2010  . HYPERLIPIDEMIA, MILD 12/23/2009  . UNSPECIFIED ANEMIA 06/07/2007  . OBESITY, NOS 04/29/2006  . TOBACCO DEPENDENCE 04/29/2006  . HYPERTENSION, BENIGN SYSTEMIC 04/29/2006   Past Medical History: Past Medical History  Diagnosis Date  . Ulcer   . GERD (gastroesophageal reflux disease)   . Hypertension   . Abnormal mammogram 10.25.11    area of density with several adjacent amorphous calcifications located laterally w/in the right breast at approx 9-10 o'clock position likely represents area of evolving far necrosis; f/u mamo and U/S in 6 months  . Obesity    Past Surgical History: Past Surgical History  Procedure Laterality Date  . Cesarean section    . Upper gastrointestinal endoscopy  1991  . Trigger finger release Left     middle finger   Social History: History  Substance Use Topics  . Smoking status: Current Every Day Smoker -- 0.50 packs/day    Types: Cigarettes  . Smokeless tobacco: Never Used     Comment: does  not want to quit  . Alcohol Use: No   Additional social history: Smokes 8-10 cigarettes per day for 20+ years, no alcohol, no recreational drugs Please also refer to relevant sections of EMR.  Family History: Family History  Problem Relation Age of Onset  . Cancer Mother     Bladder cancer, cause of death  . Diabetes Father   . Hypertension Father    . Hypertension Sister   . Hypertension Brother   . Cancer Maternal Aunt   . Breast cancer Maternal Aunt   . Ovarian cancer Maternal Aunt    Allergies and Medications: Allergies  Allergen Reactions  . Ace Inhibitors     REACTION: rash on breast  . Hydrochlorothiazide     REACTION: "rash on breast and breast pain"   No current facility-administered medications on file prior to encounter.   Current Outpatient Prescriptions on File Prior to Encounter  Medication Sig Dispense Refill  . amLODipine (NORVASC) 5 MG tablet Take 1 tablet (5 mg total) by mouth daily. 90 tablet 0  . metoprolol (LOPRESSOR) 100 MG tablet Take 1 tablet (100 mg total) by mouth 2 (two) times daily. 60 tablet 6  . naproxen sodium (ALEVE) 220 MG tablet Take 1 tablet (220 mg total) by mouth 2 (two) times daily with [name] meal. (Patient taking differently: Take 220 mg by mouth 2 (two) times daily as needed. ) 60 tablet 2  . cyclobenzaprine (FLEXERIL) 5 MG tablet Take 1 tablet (5 mg total) by mouth 3 (three) times daily as needed for muscle spasms. (Patient not taking: Reported on 07/14/2014) 30 tablet 0  . Olopatadine HCl (PATADAY) 0.2 % SOLN Apply 1 drop to eye daily. (Patient not taking: Reported on 07/14/2014) 2.5 mL 0    Objective: BP 149/95 mmHg  Pulse 60  Temp(Src) 97.6 F (36.4 C) (Oral)  Resp 22  Ht 5\' 6"  (1.676 m)  Wt 348 lb (157.852 kg)  BMI 56.20 kg/m2  SpO2 97% Exam: General: 59 year old female in NAD sitting in hospital bed, morbidly obese  Eyes: Sclera clear, anicteric, EOMI ENTM: MMM Neck: Supple, FROM Cardiovascular: Distant heart sounds, RRR, no murmurs appreciated Respiratory: NWOB, CTAB Abdomen: Obese, +BS, S, NT, ND MSK: Extremities WWP, no edema Skin: No rashes Neuro: Alert and conversational. No focal neurological deficits Psych: No apparent SI/HI  Labs and Imaging: CBC BMET   Recent Labs Lab 07/14/14 1514  WBC 5.2  HGB 12.7  HCT 38.1  PLT 187    Recent Labs Lab 07/14/14 1514   NA 136  K 4.1  CL 103  CO2 23  BUN 18  CREATININE 1.12*  GLUCOSE 168*  CALCIUM 9.3      Troponin 0.00 BNP 21  EKG: Sinus bradycardia, 1-mm ST depression in II, III (no priors for comparison)  Dg Chest 2 View  07/14/2014   CLINICAL DATA:  Mid chest pain  EXAM: CHEST  2 VIEW  COMPARISON:  12/26/2009  FINDINGS: Lungs are adequately inflated without consolidation or effusion. Cardiomediastinal silhouette is within normal. There are degenerative changes of the thoracic spine.  IMPRESSION: No active cardiopulmonary disease.   Electronically Signed   By: Elberta Fortisaniel  Boyle M.D.   On: 07/14/2014 15:54   Ardith Darkaleb M Parker, MD 07/14/2014, 10:27 PM PGY-1, [facility] FPTS Intern pager: [phone], text pages welcome  Upper Level Addendum:  I have seen and evaluated this patient along with Dr. Jimmey RalphParker and reviewed the above note, making necessary [facility].  Clare Gandy, MD Family Medicine PGY-2

## 2014-07-14 NOTE — ED Notes (Signed)
Pt reports mid chest pain and left arm pain/numbness that started one hour ago. Having sob. Airway intact, ekg done.

## 2014-07-15 DIAGNOSIS — I739 Peripheral vascular disease, unspecified: Secondary | ICD-10-CM | POA: Diagnosis present

## 2014-07-15 DIAGNOSIS — I209 Angina pectoris, unspecified: Secondary | ICD-10-CM | POA: Diagnosis not present

## 2014-07-15 DIAGNOSIS — I472 Ventricular tachycardia: Secondary | ICD-10-CM

## 2014-07-15 DIAGNOSIS — I214 Non-ST elevation (NSTEMI) myocardial infarction: Secondary | ICD-10-CM | POA: Diagnosis present

## 2014-07-15 DIAGNOSIS — K219 Gastro-esophageal reflux disease without esophagitis: Secondary | ICD-10-CM | POA: Diagnosis present

## 2014-07-15 DIAGNOSIS — R7989 Other specified abnormal findings of blood chemistry: Secondary | ICD-10-CM | POA: Insufficient documentation

## 2014-07-15 DIAGNOSIS — I2510 Atherosclerotic heart disease of native coronary artery without angina pectoris: Secondary | ICD-10-CM | POA: Diagnosis not present

## 2014-07-15 DIAGNOSIS — Z6841 Body Mass Index (BMI) 40.0 and over, adult: Secondary | ICD-10-CM | POA: Diagnosis not present

## 2014-07-15 DIAGNOSIS — R079 Chest pain, unspecified: Secondary | ICD-10-CM | POA: Diagnosis present

## 2014-07-15 DIAGNOSIS — E785 Hyperlipidemia, unspecified: Secondary | ICD-10-CM | POA: Diagnosis present

## 2014-07-15 DIAGNOSIS — F17210 Nicotine dependence, cigarettes, uncomplicated: Secondary | ICD-10-CM | POA: Diagnosis present

## 2014-07-15 DIAGNOSIS — I10 Essential (primary) hypertension: Secondary | ICD-10-CM | POA: Diagnosis present

## 2014-07-15 DIAGNOSIS — M79609 Pain in unspecified limb: Secondary | ICD-10-CM | POA: Diagnosis not present

## 2014-07-15 DIAGNOSIS — R778 Other specified abnormalities of plasma proteins: Secondary | ICD-10-CM | POA: Insufficient documentation

## 2014-07-15 DIAGNOSIS — I25110 Atherosclerotic heart disease of native coronary artery with unstable angina pectoris: Secondary | ICD-10-CM | POA: Diagnosis present

## 2014-07-15 LAB — TROPONIN I
TROPONIN I: 0.65 ng/mL — AB (ref <0.031)
Troponin I: 1.69 ng/mL (ref <0.031)
Troponin I: 2.75 ng/mL (ref <0.031)

## 2014-07-15 LAB — LIPID PANEL
CHOLESTEROL: 223 mg/dL — AB (ref 0–200)
HDL: 52 mg/dL (ref >40)
LDL CALC: 148 mg/dL — AB (ref 0–99)
TRIGLYCERIDES: 115 mg/dL (ref <150)
Total CHOL/HDL Ratio: 4.3 RATIO
VLDL: 23 mg/dL (ref 0–40)

## 2014-07-15 LAB — PROTIME-INR
INR: 1.1 (ref 0.00–1.49)
Prothrombin Time: 14.3 seconds (ref 11.6–15.2)

## 2014-07-15 LAB — HEPARIN LEVEL (UNFRACTIONATED)
HEPARIN UNFRACTIONATED: 0.26 [IU]/mL — AB (ref 0.30–0.70)
Heparin Unfractionated: 0.67 [IU]/mL (ref 0.30–0.70)
Heparin Unfractionated: 0.72 [IU]/mL — ABNORMAL HIGH (ref 0.30–0.70)

## 2014-07-15 LAB — TSH: TSH: 3.481 u[IU]/mL (ref 0.350–4.500)

## 2014-07-15 MED ORDER — ASPIRIN EC 325 MG PO TBEC: 325.0000 mg | DELAYED_RELEASE_TABLET | Freq: Every day | ORAL | Status: DC

## 2014-07-15 MED ORDER — SODIUM CHLORIDE 0.9 % IJ SOLN: 3.0000 mL | INTRAMUSCULAR | Status: DC | PRN

## 2014-07-15 MED ORDER — SODIUM CHLORIDE 0.9 % IV SOLN: 250.0000 mL | INTRAVENOUS | Status: DC | PRN

## 2014-07-15 MED ORDER — HEPARIN (PORCINE) IN NACL 100-0.45 UNIT/ML-% IJ SOLN
1500.0000 [IU]/h | INTRAMUSCULAR | Status: DC
  Administered 2014-07-15 (×2): 1600 [IU]/h via INTRAVENOUS
  Filled 2014-07-15 (×3): qty 250

## 2014-07-15 MED ORDER — HEPARIN BOLUS VIA INFUSION
4000.0000 [IU] | Freq: Once | INTRAVENOUS | Status: AC
  Administered 2014-07-15: 4000 [IU] via INTRAVENOUS
  Filled 2014-07-15: qty 4000

## 2014-07-15 MED ORDER — SODIUM CHLORIDE 0.9 % IJ SOLN
3.0000 mL | Freq: Two times a day (BID) | INTRAMUSCULAR | Status: DC
  Administered 2014-07-15 (×2): 3 mL via INTRAVENOUS

## 2014-07-15 MED ORDER — ASPIRIN 81 MG PO CHEW
81.0000 mg | CHEWABLE_TABLET | ORAL | Status: AC
  Administered 2014-07-16: 81 mg via ORAL

## 2014-07-15 MED ORDER — SODIUM CHLORIDE 0.9 % IV SOLN
INTRAVENOUS | Status: DC
  Administered 2014-07-15: 21:00:00 via INTRAVENOUS

## 2014-07-15 NOTE — Consult Note (Addendum)
CARDIOLOGY CONSULT NOTE   Patient ID: Tammy [name] MRN: [id number], DOB/AGE: 09/10/1955   Admit date: 07/14/2014 Date of Consult: 07/15/2014  Primary Physician: Shirlee LatchBacigalupo, [name], MD Primary Cardiologist: None  Reason for consult:  NSTEMI  Problem List  Past Medical History  Diagnosis Date  . Ulcer   . GERD (gastroesophageal reflux disease)   . Hypertension   . Abnormal mammogram 10.25.11    area of density with several adjacent amorphous calcifications located laterally w/in the right breast at approx 9-10 o'clock position likely represents area of evolving far necrosis; f/u mamo and U/S in 6 months  . Obesity     Past Surgical History  Procedure Laterality Date  . Cesarean section    . Upper gastrointestinal endoscopy  1991  . Trigger finger release Left     middle finger    Allergies  Allergies  Allergen Reactions  . Ace Inhibitors     REACTION: rash on breast  . Hydrochlorothiazide     REACTION: "rash on breast and breast pain"   HPI   A morbidly obese 59 year old female, ongoing heavy smoker with h/o hypertension and untreated hyperlipidemia who came yesterday with chest pressure that was radiating into her left arm. She ruled in for NSTEMI and was started on Heprin drip, she is currently chest pain free. The patient denies any FH of premature CAD. She is minimally active, her BMI is 59. She has baseline dyspnea on mild exertion. Denies palpitations or syncope.   Inpatient Medications  . amLODipine  5 mg Oral Daily  . aspirin EC  325 mg Oral Daily  . atorvastatin  10 mg Oral q1800  . metoprolol  100 mg Oral BID  . olopatadine  1 drop Both Eyes BID   . heparin 1,600 Units/hr (07/15/14 0221)   Family History Family History  Problem Relation Age of Onset  . Cancer Mother     Bladder cancer, cause of death  . Diabetes Father   . Hypertension Father   . Hypertension Sister   . Hypertension Brother   . Cancer Maternal Aunt   . Breast cancer  Maternal Aunt   . Ovarian cancer Maternal Aunt      Social History History   Social History  . Marital Status: Single    Spouse Name: N/A  . Number of Children: N/A  . Years of Education: N/A   Occupational History  . Not on file.   Social History Main Topics  . Smoking status: Current Every Day Smoker -- 0.50 packs/day    Types: Cigarettes  . Smokeless tobacco: Never Used     Comment: does not want to quit  . Alcohol Use: No  . Drug Use: No  . Sexual Activity: Not Currently    Birth Control/ Protection: Post-menopausal, Abstinence   Other Topics Concern  . Not on file   Social History Narrative    Review of Systems  General:  No chills, fever, night sweats or weight changes.  Cardiovascular:  No chest pain, dyspnea on exertion, edema, orthopnea, palpitations, paroxysmal nocturnal dyspnea. Dermatological: No rash, lesions/masses Respiratory: No cough, dyspnea Urologic: No hematuria, dysuria Abdominal:   No nausea, vomiting, diarrhea, bright red blood per rectum, melena, or hematemesis Neurologic:  No visual changes, wkns, changes in mental status. All other systems reviewed and are otherwise negative except as noted above.  Physical Exam  Blood pressure 130/67, pulse 58, temperature 98 F (36.7 C), temperature source Oral, resp. rate 20, height 5'  6" (1.676 m), weight 347 lb 12.8 oz (157.761 kg), SpO2 98 %.  General: Pleasant, NAD, morbidly obese Psych: Normal affect. Neuro: Alert and oriented X 3. Moves all extremities spontaneously. HEENT: Normal  Neck: Supple without bruits or JVD. Lungs:  Resp regular and unlabored, CTA. Heart: RRR no s3, s4, or murmurs. Abdomen: Soft, non-tender, non-distended, BS + x 4.  Extremities: No clubbing, cyanosis or edema. DP/PT/Radials 2+ and equal bilaterally.  Labs  Recent Labs  07/15/14 0029 07/15/14 0600  TROPONINI 0.65* 1.69*   Lab Results  Component Value Date   WBC 5.2 07/14/2014   HGB 12.7 07/14/2014   HCT  38.1 07/14/2014   MCV 82.5 07/14/2014   PLT 187 07/14/2014    Recent Labs Lab 07/14/14 1514  NA 136  K 4.1  CL 103  CO2 23  BUN 18  CREATININE 1.12*  CALCIUM 9.3  GLUCOSE 168*   Lab Results  Component Value Date   CHOL 223* 07/15/2014   HDL 52 07/15/2014   LDLCALC 148* 07/15/2014   TRIG 115 07/15/2014   Radiology/Studies  Dg Chest 2 View  07/14/2014   CLINICAL DATA:  Mid chest pain  EXAM: CHEST  2 VIEW  COMPARISON:  12/26/2009  FINDINGS: Lungs are adequately inflated without consolidation or effusion. Cardiomediastinal silhouette is within normal. There are degenerative changes of the thoracic spine.  IMPRESSION: No active cardiopulmonary disease.   Electronically Signed   By: Elberta Fortisaniel  Boyle M.D.   On: 07/14/2014 15:54   Echocardiogram - none  ECG: SB, negative T waves in the lateral leads    ASSESSMENT AND PLAN  59  Years old female   1. NSTEMI - uptrending troponin 0.00--> 0.65 -->1.69, the patient has been started on heparin drip and is currently asymptomatic - continue aspirin, atorvastatin, metoprolol - order an echocardiogram - we will schedule for a cath in the am unless her symptoms worsen   2. Hypertension - controlled on current regimen  3. Hyperlipidemia -started on atorvastatin.  4. nsVT - ischemic 11 beats, - for now continue metoprolol, if continues we can start Lidocan drip.   5. Morbid obesity - she will need to loose weight and might need an outpatient sleep study for OSA evaluation  6. Smoking cessation - counseling provided   Signed, Lars MassonNELSON, Lindie Roberson H, MD, [facility] 07/15/2014, 9:55 AM

## 2014-07-15 NOTE — Progress Notes (Addendum)
ANTICOAGULATION CONSULT NOTE - Follow Up Consult  Pharmacy Consult for Heparin Indication: chest pain/ACS  Allergies  Allergen Reactions  . Ace Inhibitors     REACTION: rash on breast  . Hydrochlorothiazide     REACTION: "rash on breast and breast pain"    Patient Measurements: Height: 5\' 6"  (167.6 cm) Weight: (!) 347 lb 12.8 oz (157.761 kg) IBW/kg (Calculated) : 59.3 Heparin Dosing Weight: 100kg  Vital Signs: Temp: 98 F (36.7 C) (05/15 0900) Temp Source: Oral (05/15 0900) BP: 130/67 mmHg (05/15 0900) Pulse Rate: 58 (05/15 0900)  Labs:  Recent Labs  07/14/14 1514 07/15/14 0029 07/15/14 0600 07/15/14 0720  HGB 12.7  --   --   --   HCT 38.1  --   --   --   PLT 187  --   --   --   HEPARINUNFRC  --   --   --  0.67  CREATININE 1.12*  --   --   --   TROPONINI  --  0.65* 1.69*  --     Estimated Creatinine Clearance: 85.3 mL/min (by C-G formula based on Cr of 1.12).   Medications:  Heparin @ 1500 units/hr  Assessment: 59yof started on IV heparin this morning for chest pain with elevated troponins. Initial heparin level is at goal. CBC is stable. No bleeding reported.  Goal of Therapy:  Heparin level 0.3-0.7 units/ml Monitor platelets by anticoagulation protocol: Yes   Plan:  1) Continue heparin at 1500 units/hr 2) Check 6 hour heparin level to confirm  Fredrik RiggerMarkle, Kele Withem Sue 07/15/2014,9:39 AM   Addendum: Confirmatory heparin level is slightly below goal at 0.26, however, patient's IV infiltrated earlier today and heparin was off for a short period of time. This is probably why it is low. Will not adjust rate. Continue heparin at 1600 units/hr and re-check another level in 6 hours.   Fredrik RiggerMarkle, Norah Fick Sue 07/15/2014, 1:37 PM

## 2014-07-15 NOTE — Progress Notes (Signed)
Spoke with on call and reported 1st Troponin value of 0.65. No orders received. Pt is sleeping and no reports of pain except for left calf

## 2014-07-15 NOTE — Progress Notes (Signed)
Alerted to an alarm indicating pt had a 20 beat VT. Pt assessed and determined to be asymptomatic. Called K. Lawrence NP on alarm  as VT vs  WQRS. No new orders received. Pt for cardiac cath in AM.

## 2014-07-15 NOTE — Progress Notes (Signed)
On call gave orders to start Heparin gtt, pharmacy to dose

## 2014-07-15 NOTE — Progress Notes (Signed)
Dr. Delton SeeNelson updated with new consult pt and uptrending troponin level from 0.65 to 1.69. Thanks

## 2014-07-15 NOTE — Progress Notes (Signed)
ANTICOAGULATION CONSULT NOTE - Initial Consult  Pharmacy Consult for Heparin Indication: chest pain/ACS  Allergies  Allergen Reactions  . Ace Inhibitors     REACTION: rash on breast  . Hydrochlorothiazide     REACTION: "rash on breast and breast pain"    Patient Measurements: Height: 5\' 6"  (167.6 cm) Weight: (!) 348 lb (157.852 kg) IBW/kg (Calculated) : 59.3 Heparin Dosing Weight: 100 kg  Vital Signs: Temp: 98 F (36.7 C) (05/14 2350) Temp Source: Oral (05/14 2350) BP: 121/100 mmHg (05/14 2350) Pulse Rate: 56 (05/14 2350)  Labs:  Recent Labs  07/14/14 1514 07/15/14 0029  HGB 12.7  --   HCT 38.1  --   PLT 187  --   CREATININE 1.12*  --   TROPONINI  --  0.65*    Estimated Creatinine Clearance: 85.3 mL/min (by C-G formula based on Cr of 1.12).   Medical History: Past Medical History  Diagnosis Date  . Ulcer   . GERD (gastroesophageal reflux disease)   . Hypertension   . Abnormal mammogram 10.25.11    area of density with several adjacent amorphous calcifications located laterally w/in the right breast at approx 9-10 o'clock position likely represents area of evolving far necrosis; f/u mamo and U/S in 6 months  . Obesity     Medications:  Prescriptions prior to admission  Medication Sig Dispense Refill Last Dose  . amLODipine (NORVASC) 5 MG tablet Take 1 tablet (5 mg total) by mouth daily. 90 tablet 0 07/14/2014 at Unknown time  . metoprolol (LOPRESSOR) 100 MG tablet Take 1 tablet (100 mg total) by mouth 2 (two) times daily. 60 tablet 6 07/14/2014 at 0800  . naproxen sodium (ALEVE) 220 MG tablet Take 1 tablet (220 mg total) by mouth 2 (two) times daily with a meal. (Patient taking differently: Take 220 mg by mouth 2 (two) times daily as needed. ) 60 tablet 2 07/14/2014 at Unknown time  . cyclobenzaprine (FLEXERIL) 5 MG tablet Take 1 tablet (5 mg total) by mouth 3 (three) times daily as needed for muscle spasms. (Patient not taking: Reported on 07/14/2014) 30 tablet  0 Not Taking at Unknown time  . Olopatadine HCl (PATADAY) 0.2 % SOLN Apply 1 drop to eye daily. (Patient not taking: Reported on 07/14/2014) 2.5 mL 0 Not Taking at Unknown time    Assessment: 59 y.o. female with chest pain for heparin Goal of Therapy:  Heparin level 0.3-0.7 units/ml Monitor platelets by anticoagulation protocol: Yes   Plan:  Heparin 4000 units IV bolus, then start heparin 1600 units/hr Check heparin level in 6 hours.   Eddie Candlebbott, Leeon Makar Vernon 07/15/2014,2:03 AM

## 2014-07-15 NOTE — Progress Notes (Signed)
Family Medicine Teaching Service Daily Progress Note Intern Pager: [phone]  Patient name: Tammy [name] Medical record number: [id number] Date of birth: 1955-08-16 Age: 59 y.o. Gender: female  Primary Care Provider: Shirlee LatchBacigalupo, [name], MD Consultants: Cardiology Code Status: Full  Pt Overview and Major Events to Date:  5/15 - troponin bump, cardiology consult  Assessment and Plan: Tammy FusCheryl A Kafer is a 59 y.o. female presenting with chest pain. PMH is significant for HTN, HLD, and tobacco abuse.   Chest Pain, ACS r/o. - Typical pain - With elevated troponin, ruling in for ACS- now on heparin drip, Morphine PRN, ASA, statin, and BB - continue tele - Trop - 0.00->0.65->1.69 - EKG today with T wave flattening and inversion in lateral leads, initial with slight ST changes in inferior leads - TSH 3.4, LDL 148, Tc 223, HDL 52 - G9FA1C pending - Cardiology consult - appreciate reccs/management   - Cath planned for tomorrow  HTN. At goal - Continue amlodipine and metoprolol  HLD.  - lipids per above - Start statin here  Tobacco Abuse - Encourage smoking cessation  Morbid obesity - Discussed with patient on admission  FEN/GI: SLIV, Heart healthy diet,  PPx: Heparin for ACS   Disposition: Tele for cardiac work up  Subjective:  No chest pain, feeling well today. Agreeable to plan for cath tomorrow.   Objective: Temp:  [97.6 F (36.4 C)-98.6 F (37 C)] 98 F (36.7 C) (05/15 0900) Pulse Rate:  [53-67] 61 (05/15 0956) Resp:  [16-24] 20 (05/15 0900) BP: (107-165)/(41-104) 130/67 mmHg (05/15 0900) SpO2:  [94 %-100 %] 98 % (05/15 0900) Weight:  [347 lb 12.8 oz (157.761 kg)-348 lb (157.852 kg)] 347 lb 12.8 oz (157.761 kg) (05/15 0500) Physical Exam: Gen: NAD, alert, cooperative with exam, lying in bed, obese HEENT: NCAT CV: RRR, good S1/S2, no murmur Resp: CTABL, no wheezes, non-labored Abd: SNTND, BS present, no guarding or organomegaly Ext: no edema, 2+ DP pulse on L,  no calf tenderness Neuro: Alert and oriented, No gross deficits   Laboratory:  Recent Labs Lab 07/14/14 1514  WBC 5.2  HGB 12.7  HCT 38.1  PLT 187    Recent Labs Lab 07/14/14 1514  NA 136  K 4.1  CL 103  CO2 23  BUN 18  CREATININE 1.12*  CALCIUM 9.3  GLUCOSE 168*    EKG today with T wave flattening and inversion in lateral leads, initial with slight ST changes in inferior leads  Imaging/Diagnostic Tests: DG chest 07/15/2014 IMPRESSION: No active cardiopulmonary disease.  Elenora GammaSamuel L Bradshaw, MD 07/15/2014, 10:01 AM PGY-3, [place] [facility] FPTS Intern pager: [phone], text pages welcome

## 2014-07-15 NOTE — Progress Notes (Signed)
Pharmacy- heparin  Heparin level 0.72 (goal 0.3-0.7)  A/P:  59yo female on Heparin for ACS, for cath tomorrow.  HL corrected s/p infiltration & now a bit high but not much change relative to 7AM result. No problems, no bleeding per d/w RN.  1-  Decrease heparin to 1500 units/hr 2-  F/U in AM   Marisue HumbleKendra Arsenio Schnorr, PharmD Clinical Pharmacist [facility] [place]

## 2014-07-15 NOTE — Progress Notes (Signed)
Patient with elevated troponin to 0.62. No current chest pain. Will start heparin infusion per ACS protocol. Will also make NPO for possible cath tomorrow. Cardiology aware of admission. Will call again if patient develops chest pain.  Tammy [name] M. Jimmey RalphParker, MD [facility] Resident PGY-1 07/15/2014 2:04 AM

## 2014-07-15 NOTE — Progress Notes (Signed)
Paged on call. BP was running 137/104, refused metoprolol because had 1 at home and 1 in ER. Also, pt has complained of pain in back of left calf for approximately 1 month. Informed her PCP but no action so far on it. Said it hurt when walking, un-alleviated by warm baths. Informed on call of her concerns and requested to notify attending for possible DVT. Will report off to dayshift

## 2014-07-16 ENCOUNTER — Inpatient Hospital Stay (HOSPITAL_COMMUNITY): Payer: Medicaid Other

## 2014-07-16 ENCOUNTER — Encounter (HOSPITAL_COMMUNITY)
Admission: EM | Disposition: A | Discharge status: Home / Self Care | Payer: Medicaid Other | Source: Home / Self Care | Attending: Family Medicine

## 2014-07-16 ENCOUNTER — Encounter (HOSPITAL_COMMUNITY): Payer: Self-pay | Admitting: Cardiovascular Disease

## 2014-07-16 DIAGNOSIS — I2510 Atherosclerotic heart disease of native coronary artery without angina pectoris: Secondary | ICD-10-CM

## 2014-07-16 DIAGNOSIS — I10 Essential (primary) hypertension: Secondary | ICD-10-CM

## 2014-07-16 DIAGNOSIS — M79609 Pain in unspecified limb: Secondary | ICD-10-CM

## 2014-07-16 DIAGNOSIS — E785 Hyperlipidemia, unspecified: Secondary | ICD-10-CM | POA: Insufficient documentation

## 2014-07-16 HISTORY — PX: CARDIAC CATHETERIZATION: SHX172

## 2014-07-16 LAB — CBC
HCT: 34.3 % — ABNORMAL LOW (ref 36.0–46.0)
Hemoglobin: 11.6 g/dL — ABNORMAL LOW (ref 12.0–15.0)
MCH: 27.8 pg (ref 26.0–34.0)
MCHC: 33.8 g/dL (ref 30.0–36.0)
MCV: 82.1 fL (ref 78.0–100.0)
PLATELETS: 175 10*3/uL (ref 150–400)
RBC: 4.18 MIL/uL (ref 3.87–5.11)
RDW: 13.2 % (ref 11.5–15.5)
WBC: 5.1 10*3/uL (ref 4.0–10.5)

## 2014-07-16 LAB — HEMOGLOBIN A1C
HEMOGLOBIN A1C: 5.6 % (ref 4.8–5.6)
Mean Plasma Glucose: 114 mg/dL

## 2014-07-16 LAB — BASIC METABOLIC PANEL
Anion gap: 8 (ref 5–15)
BUN: 18 mg/dL (ref 6–20)
CO2: 22 mmol/L (ref 22–32)
Calcium: 8.7 mg/dL — ABNORMAL LOW (ref 8.9–10.3)
Chloride: 105 mmol/L (ref 101–111)
Creatinine, Ser: 0.92 mg/dL (ref 0.44–1.00)
GFR calc Af Amer: >60 mL/min (ref >60)
GFR calc non Af Amer: >60 mL/min (ref >60)
Glucose, Bld: 116 mg/dL — ABNORMAL HIGH (ref 65–99)
Potassium: 4.1 mmol/L (ref 3.5–5.1)
Sodium: 135 mmol/L (ref 135–145)

## 2014-07-16 LAB — HEPARIN LEVEL (UNFRACTIONATED): Heparin Unfractionated: 0.62 IU/mL (ref 0.30–0.70)

## 2014-07-16 LAB — TROPONIN I: TROPONIN I: 2 ng/mL — AB (ref <0.031)

## 2014-07-16 SURGERY — LEFT HEART CATH AND CORONARY ANGIOGRAPHY
Anesthesia: LOCAL

## 2014-07-16 MED ORDER — MIDAZOLAM HCL 2 MG/2ML IJ SOLN
INTRAMUSCULAR | Status: DC | PRN
  Administered 2014-07-16: 2 mg via INTRAVENOUS

## 2014-07-16 MED ORDER — SODIUM CHLORIDE 0.9 % IV SOLN: 250.0000 mL | INTRAVENOUS | Status: DC | PRN

## 2014-07-16 MED ORDER — LIDOCAINE HCL (PF) 1 % IJ SOLN
INTRAMUSCULAR | Status: AC
  Filled 2014-07-16: qty 30

## 2014-07-16 MED ORDER — ATORVASTATIN CALCIUM 80 MG PO TABS
80.0000 mg | ORAL_TABLET | Freq: Every day | ORAL | Status: DC
  Administered 2014-07-16: 80 mg via ORAL
  Filled 2014-07-16: qty 1

## 2014-07-16 MED ORDER — CLOPIDOGREL BISULFATE 75 MG PO TABS
600.0000 mg | ORAL_TABLET | Freq: Once | ORAL | Status: AC
  Administered 2014-07-16: 600 mg via ORAL
  Filled 2014-07-16: qty 8

## 2014-07-16 MED ORDER — FENTANYL CITRATE (PF) 100 MCG/2ML IJ SOLN
INTRAMUSCULAR | Status: DC | PRN
  Administered 2014-07-16: 50 ug via INTRAVENOUS

## 2014-07-16 MED ORDER — HEPARIN SODIUM (PORCINE) 1000 UNIT/ML IJ SOLN
INTRAMUSCULAR | Status: DC | PRN
  Administered 2014-07-16: 6000 [IU] via INTRAVENOUS

## 2014-07-16 MED ORDER — HEPARIN SODIUM (PORCINE) 1000 UNIT/ML IJ SOLN
INTRAMUSCULAR | Status: AC
  Filled 2014-07-16: qty 1

## 2014-07-16 MED ORDER — IOHEXOL 350 MG/ML SOLN
INTRAVENOUS | Status: DC | PRN
  Administered 2014-07-16: 120 mL via INTRACARDIAC

## 2014-07-16 MED ORDER — FENTANYL CITRATE (PF) 100 MCG/2ML IJ SOLN
INTRAMUSCULAR | Status: AC
  Filled 2014-07-16: qty 2

## 2014-07-16 MED ORDER — CLOPIDOGREL BISULFATE 75 MG PO TABS
75.0000 mg | ORAL_TABLET | Freq: Every day | ORAL | Status: DC
  Administered 2014-07-17: 75 mg via ORAL
  Filled 2014-07-16: qty 1

## 2014-07-16 MED ORDER — SODIUM CHLORIDE 0.9 % IV SOLN
INTRAVENOUS | Status: AC
  Administered 2014-07-16: 11:00:00 via INTRAVENOUS

## 2014-07-16 MED ORDER — SODIUM CHLORIDE 0.9 % IJ SOLN
3.0000 mL | Freq: Two times a day (BID) | INTRAMUSCULAR | Status: DC
  Administered 2014-07-17: 3 mL via INTRAVENOUS

## 2014-07-16 MED ORDER — SODIUM CHLORIDE 0.9 % IJ SOLN
INTRAMUSCULAR | Status: DC | PRN
  Administered 2014-07-16: 10:00:00 via INTRA_ARTERIAL

## 2014-07-16 MED ORDER — NITROGLYCERIN 1 MG/10 ML FOR IR/CATH LAB
INTRA_ARTERIAL | Status: AC
  Filled 2014-07-16: qty 10

## 2014-07-16 MED ORDER — ASPIRIN EC 81 MG PO TBEC
81.0000 mg | DELAYED_RELEASE_TABLET | Freq: Every day | ORAL | Status: DC
  Administered 2014-07-17: 81 mg via ORAL
  Filled 2014-07-16: qty 1

## 2014-07-16 MED ORDER — MIDAZOLAM HCL 2 MG/2ML IJ SOLN
INTRAMUSCULAR | Status: AC
  Filled 2014-07-16: qty 2

## 2014-07-16 MED ORDER — HEPARIN (PORCINE) IN NACL 2-0.9 UNIT/ML-% IJ SOLN
INTRAMUSCULAR | Status: AC
  Filled 2014-07-16: qty 1500

## 2014-07-16 MED ORDER — SODIUM CHLORIDE 0.9 % IJ SOLN: 3.0000 mL | INTRAMUSCULAR | Status: DC | PRN

## 2014-07-16 MED ORDER — VERAPAMIL HCL 2.5 MG/ML IV SOLN
INTRAVENOUS | Status: AC
  Filled 2014-07-16: qty 2

## 2014-07-16 SURGICAL SUPPLY — 14 items

## 2014-07-16 NOTE — Progress Notes (Signed)
Patient Name: Tammy [name] Date of Encounter: 07/16/2014  Active Problems:   Chest pain   NSTEMI (non-ST elevated myocardial infarction)   Elevated troponin   HLD (hyperlipidemia)   Length of Stay: 1  SUBJECTIVE  The patient had a left cardiac cath today, medical therapy was recommended, she has been CP free since the admission.   CURRENT MEDS . amLODipine  5 mg Oral Daily  . [START ON 07/17/2014] aspirin EC  81 mg Oral Daily  . atorvastatin  80 mg Oral q1800  . [START ON 07/17/2014] clopidogrel  75 mg Oral Daily  . metoprolol  100 mg Oral BID  . olopatadine  1 drop Both Eyes BID  . sodium chloride  3 mL Intravenous Q12H   OBJECTIVE  Filed Vitals:   07/16/14 1050 07/16/14 1054 07/16/14 1118 07/16/14 1153  BP: 131/80  123/75 130/76  Pulse: 57 0 63 60  Temp:      TempSrc:      Resp: 17 0 20 22  Height:      Weight:      SpO2: 97% 0% 100% 99%    Intake/Output Summary (Last 24 hours) at 07/16/14 1155 Last data filed at 07/16/14 0500  Gross per 24 hour  Intake 1281.82 ml  Output      0 ml  Net 1281.82 ml   Filed Weights   07/14/14 1503 07/15/14 0500 07/16/14 0400  Weight: 348 lb (157.852 kg) 347 lb 12.8 oz (157.761 kg) 349 lb 10.4 oz (158.6 kg)    PHYSICAL EXAM  General: Pleasant, NAD. Neuro: Alert and oriented X 3. Moves all extremities spontaneously. Psych: Normal affect. HEENT:  Normal  Neck: Supple without bruits or JVD. Lungs:  Resp regular and unlabored, CTA. Heart: RRR no s3, s4, or murmurs. Abdomen: Soft, non-tender, non-distended, BS + x 4.  Extremities: No clubbing, cyanosis or edema. DP/PT/Radials 2+ and equal bilaterally.  Accessory Clinical Findings  CBC  Recent Labs  07/14/14 1514 07/16/14 0353  WBC 5.2 5.1  HGB 12.7 11.6*  HCT 38.1 34.3*  MCV 82.5 82.1  PLT 187 175   Basic Metabolic Panel  Recent Labs  07/14/14 1514 07/16/14 0353  NA 136 135  K 4.1 4.1  CL 103 105  CO2 23 22  GLUCOSE 168* 116*  BUN 18 18    CREATININE 1.12* 0.92  CALCIUM 9.3 8.7*   Cardiac Enzymes  Recent Labs  07/15/14 0600 07/15/14 2137 07/16/14 0353  TROPONINI 1.69* 2.75* 2.00*    Recent Labs  07/15/14 0600  CHOL 223*  HDL 52  LDLCALC 148*  TRIG 115  CHOLHDL 4.3   Thyroid Function Tests  Recent Labs  07/15/14 0029  TSH 3.481   Radiology/Studies  Dg Chest 2 View  07/14/2014   CLINICAL DATA:  Mid chest pain  EXAM: CHEST  2 VIEW  COMPARISON:  12/26/2009  FINDINGS: Lungs are adequately inflated without consolidation or effusion. Cardiomediastinal silhouette is within normal. There are degenerative changes of the thoracic spine.  IMPRESSION: No active cardiopulmonary disease.     TELE: Sinus bradycardia  Echo: Left ventricle: The cavity size was normal. Wall thickness was normal. Systolic function was normal. The estimated ejection fraction was in the range of 55% to 60%. Wall motion was normal; there were no regional wall motion abnormalities. Left ventricular diastolic function parameters were normal. - Left atrium: The atrium was mildly dilated.   ASSESSMENT AND PLAN  59 Years old female   1. NSTEMI -  troponin max 2.75 --> 2.0, off heparin, cath today, medical therapy recommended, we will continue DAPT for 1 year with ASA + Plavix.  - continue atorvastatin, metoprolol - LVEF is preserved  2. Hypertension - controlled on current regimen If her pain recurs we will consider adding Imdur  3. Hyperlipidemia -started on atorvastatin.  4. nsVT - ischemic 11 beats yesterday, none since then  5. Morbid obesity - she will need to loose weight and might need an outpatient sleep study for OSA evaluation  6. Smoking cessation - counseling provided    Signed, Lars MassonNELSON, Shanti Agresti H MD, [facility] 07/16/2014

## 2014-07-16 NOTE — Interval H&P Note (Signed)
History and Physical Interval Note:  07/16/2014 9:59 AM  Tammy [name]  has presented today for cardiac cath with the diagnosis of unstable angina/NSTEMI.  The various methods of treatment have been discussed with the patient and family. After consideration of risks, benefits and other options for treatment, the patient has consented to  Procedure(s): Left Heart Cath and Coronary Angiography (N/A) as a surgical intervention .  The patient's history has been reviewed, patient examined, no change in status, stable for surgery.  I have reviewed the patient's chart and labs.  Questions were answered to the patient's satisfaction.    Cath Lab Visit (complete for each Cath Lab visit)  Clinical Evaluation Leading to the Procedure:   ACS: Yes.    Non-ACS:    Anginal Classification: CCS IV  Anti-ischemic medical therapy: Maximal Therapy (2 or more classes of medications)  Non-Invasive Test Results: No non-invasive testing performed  Prior CABG: No previous CABG         Kieran Arreguin

## 2014-07-16 NOTE — H&P (View-Only) (Signed)
 CARDIOLOGY CONSULT NOTE   Patient ID: Tammy [name] MRN: [id number], DOB/AGE: 58/03/1955   Admit date: 07/14/2014 Date of Consult: 07/15/2014  Primary Physician: Bacigalupo, [name], MD Primary Cardiologist: None  Reason for consult:  NSTEMI  Problem List  Past Medical History  Diagnosis Date  . Ulcer   . GERD (gastroesophageal reflux disease)   . Hypertension   . Abnormal mammogram 10.25.11    area of density with several adjacent amorphous calcifications located laterally w/in the right breast at approx 9-10 o'clock position likely represents area of evolving far necrosis; f/u mamo and U/S in 6 months  . Obesity     Past Surgical History  Procedure Laterality Date  . Cesarean section    . Upper gastrointestinal endoscopy  1991  . Trigger finger release Left     middle finger    Allergies  Allergies  Allergen Reactions  . Ace Inhibitors     REACTION: rash on breast  . Hydrochlorothiazide     REACTION: "rash on breast and breast pain"   HPI   A morbidly obese 59 year old female, ongoing heavy smoker with h/o hypertension and untreated hyperlipidemia who came yesterday with chest pressure that was radiating into her left arm. She ruled in for NSTEMI and was started on Heprin drip, she is currently chest pain free. The patient denies any FH of premature CAD. She is minimally active, her BMI is 59. She has baseline dyspnea on mild exertion. Denies palpitations or syncope.   Inpatient Medications  . amLODipine  5 mg Oral Daily  . aspirin EC  325 mg Oral Daily  . atorvastatin  10 mg Oral q1800  . metoprolol  100 mg Oral BID  . olopatadine  1 drop Both Eyes BID   . heparin 1,600 Units/hr (07/15/14 0221)   Family History Family History  Problem Relation Age of Onset  . Cancer Mother     Bladder cancer, cause of death  . Diabetes Father   . Hypertension Father   . Hypertension Sister   . Hypertension Brother   . Cancer Maternal Aunt   . Breast cancer  Maternal Aunt   . Ovarian cancer Maternal Aunt      Social History History   Social History  . Marital Status: Single    Spouse Name: N/A  . Number of Children: N/A  . Years of Education: N/A   Occupational History  . Not on file.   Social History Main Topics  . Smoking status: Current Every Day Smoker -- 0.50 packs/day    Types: Cigarettes  . Smokeless tobacco: Never Used     Comment: does not want to quit  . Alcohol Use: No  . Drug Use: No  . Sexual Activity: Not Currently    Birth Control/ Protection: Post-menopausal, Abstinence   Other Topics Concern  . Not on file   Social History Narrative    Review of Systems  General:  No chills, fever, night sweats or weight changes.  Cardiovascular:  No chest pain, dyspnea on exertion, edema, orthopnea, palpitations, paroxysmal nocturnal dyspnea. Dermatological: No rash, lesions/masses Respiratory: No cough, dyspnea Urologic: No hematuria, dysuria Abdominal:   No nausea, vomiting, diarrhea, bright red blood per rectum, melena, or hematemesis Neurologic:  No visual changes, wkns, changes in mental status. All other systems reviewed and are otherwise negative except as noted above.  Physical Exam  Blood pressure 130/67, pulse 58, temperature 98 F (36.7 C), temperature source Oral, resp. rate 20, height 5'   6" (1.676 m), weight 347 lb 12.8 oz (157.761 kg), SpO2 98 %.  General: Pleasant, NAD, morbidly obese Psych: Normal affect. Neuro: Alert and oriented X 3. Moves all extremities spontaneously. HEENT: Normal  Neck: Supple without bruits or JVD. Lungs:  Resp regular and unlabored, CTA. Heart: RRR no s3, s4, or murmurs. Abdomen: Soft, non-tender, non-distended, BS + x 4.  Extremities: No clubbing, cyanosis or edema. DP/PT/Radials 2+ and equal bilaterally.  Labs  Recent Labs  07/15/14 0029 07/15/14 0600  TROPONINI 0.65* 1.69*   Lab Results  Component Value Date   WBC 5.2 07/14/2014   HGB 12.7 07/14/2014   HCT  38.1 07/14/2014   MCV 82.5 07/14/2014   PLT 187 07/14/2014    Recent Labs Lab 07/14/14 1514  NA 136  K 4.1  CL 103  CO2 23  BUN 18  CREATININE 1.12*  CALCIUM 9.3  GLUCOSE 168*   Lab Results  Component Value Date   CHOL 223* 07/15/2014   HDL 52 07/15/2014   LDLCALC 148* 07/15/2014   TRIG 115 07/15/2014   Radiology/Studies  Dg Chest 2 View  07/14/2014   CLINICAL DATA:  Mid chest pain  EXAM: CHEST  2 VIEW  COMPARISON:  12/26/2009  FINDINGS: Lungs are adequately inflated without consolidation or effusion. Cardiomediastinal silhouette is within normal. There are degenerative changes of the thoracic spine.  IMPRESSION: No active cardiopulmonary disease.   Electronically Signed   By: Elberta Fortisaniel  Boyle M.D.   On: 07/14/2014 15:54   Echocardiogram - none  ECG: SB, negative T waves in the lateral leads    ASSESSMENT AND PLAN  59  Years old female   1. NSTEMI - uptrending troponin 0.00--> 0.65 -->1.69, the patient has been started on heparin drip and is currently asymptomatic - continue aspirin, atorvastatin, metoprolol - order an echocardiogram - we will schedule for a cath in the am unless her symptoms worsen   2. Hypertension - controlled on current regimen  3. Hyperlipidemia -started on atorvastatin.  4. nsVT - ischemic 11 beats, - for now continue metoprolol, if continues we can start Lidocan drip.   5. Morbid obesity - she will need to loose weight and might need an outpatient sleep study for OSA evaluation  6. Smoking cessation - counseling provided   Signed, Lars MassonNELSON, Levana Minetti H, MD, [facility] 07/15/2014, 9:55 AM

## 2014-07-16 NOTE — Discharge Summary (Signed)
[facility] Dis[facility] Summary  Patient name: Tammy [name] Medical record number: [id number] Date of birth: Jul 03, 1955 Age: 59 y.o. Gender: female Date of Admission: 07/14/2014  Date of Discharge: Cardiology Admitting Physician: Uvaldo RisingKyle J Fletke, MD  Primary Care Provider: Shirlee LatchBacigalupo, [name], MD Consultants: Cardiology  Indication for Hospitalization: Chest pain  Discharge Diagnoses/Problem List:  NSTEMI, PAD, HTN, HLD, tobacco abuse  Disposition: Home  Discharge Condition: Improved  Discharge Exam:  Blood pressure 125/55, pulse 61, temperature 98.2 F (36.8 C), temperature source Oral, resp. rate 18, height 5\' 6"  (1.676 m), weight 348 lb 15.8 oz (158.3 kg), SpO2 100 %. Gen: NAD, alert, cooperative with exam, lying in bed, obese HEENT: NCAT CV: RRR, good S1/S2, no murmur Resp: CTABL, no wheezes, non-labored Abd: SNTND, BS present, no guarding or organomegaly Ext: no edema, 2+ DP pulse on L, no calf tenderness Neuro: Alert and oriented, No gross deficits  Brief Hospital Course:  Tammy [name] is a 59 year old female with PMH of HLD, HTN, and tobacco abuse who presented with chest pain. Her hospital course by problem is outlined below:  Chest pain / NSTEMI. Patient presented with chest pain that started with exertion and was relieved with nitroglycerin in the ED. Initial work up revealed mild, nonspecific ST depression in leads II and III. Initial troponin was negative, however patient then develop rise in serum troponin levels. She did not have any recurrence of chest pain. Patient was started on a heparin drip and cardiology was consulted. Patient underwent catheterization which revealed moderate stenosis in several arteries with no focal lesion. No stents were placed. Patient was started on plavix and aspirin with plans to continue dual antiplatelet therapy for at least 1 year. Patient was also started on atorvastatin 80mg  daily. Patient tolerated the  procedure catheterization well and was discharged home in stable condition  PAD. Patient endorsed a several month history of left leg claudication symptoms on admission. We obtained ABIs which were significant for a left ABI of 0.32. Patient was started on statin and aspirin as above.   Tobacco Abuse. Patient was encouraged to stop smoking. Resources were given at discharge.   The patient's other chronic medical conditions were stable during this admission and the patient was continued on her home medications.    Issues for Follow Up:  1. F/u Chest pain - will be on dual antiplatelet therapy with plavix and aspirin for 1 year per cardiology. Patient was also started on atorvastatin 80mg  daily here.  2. Continue to encourage smoking cessation.  3. Patient diagnosed with PAD here and started on statin and aspirin as above. May need outpatient PT. Would benefit greatly from smoking cessation. Can consider starting cilastazol as outpatient after rehab.   Significant Procedures: Cardiac catheterization on 5/16.   Significant Labs and Imaging:   Recent Labs Lab 07/14/14 1514 07/16/14 0353 07/17/14 0247  WBC 5.2 5.1 3.9*  HGB 12.7 11.6* 11.6*  HCT 38.1 34.3* 35.3*  PLT 187 175 176    Recent Labs Lab 07/14/14 1514 07/16/14 0353 07/17/14 0247  NA 136 135 137  K 4.1 4.1 4.4  CL 103 105 106  CO2 23 22 22   GLUCOSE 168* 116* 118*  BUN 18 18 15   CREATININE 1.12* 0.92 0.91  CALCIUM 9.3 8.7* 9.3      Recent Labs Lab 07/15/14 0029 07/15/14 0600 07/15/14 2137 07/16/14 0353  TROPONINI 0.65* 1.69* 2.75* 2.00*   BNP 21  A1c 5.6  TSH 3.481 Lipid  Panel: Total Cholesterol 223, TG 115, HDL 52, LDL 148  Right ABI 0.88 Left ABI 0.32  EKG 5/14: Sinus bradycardia, 1-mm ST depression in II, III (no priors for comparison)  EKG 5/15: T wave flattening and inversion in lateral leads  Catheterization 5/16 1. Diffuse moderate triple vessel CAD without focal culprit lesion for NSTEMI.   2. Moderate stenosis mid RCA which does not appear to be a ruptured plaque and is not flow limiting 3. Moderate stenosis in the small caliber distal LAD which does not appear to be a ruptured plaque and is not flow limiting. 4. Moderate disease in the small caliber third obtuse marginal branch 5. Normal LV function 6. NSTEMI with diffuse moderate CAD but no focal culprit lesion   Echo 5/16 Study Conclusions - Left ventricle: The cavity size was normal. Wall thickness was normal. Systolic function was normal. The estimated ejection fraction was in the range of 55% to 60%. Wall motion was normal; there were no regional wall motion abnormalities. Left ventricular diastolic function parameters were normal. - Left atrium: The atrium was mildly dilated.  Results/Tests Pending at Time of Discharge: None  Discharge Medications:    Medication List    STOP taking these medications        naproxen sodium 220 MG tablet  Commonly known as:  ALEVE      TAKE these medications        amLODipine 5 MG tablet  Commonly known as:  NORVASC  Take 1 tablet (5 mg total) by mouth daily.     aspirin 81 MG EC tablet  Take 1 tablet (81 mg total) by mouth daily.     atorvastatin 80 MG tablet  Commonly known as:  LIPITOR  Take 1 tablet (80 mg total) by mouth daily at 6 PM.     clopidogrel 75 MG tablet  Commonly known as:  PLAVIX  Take 1 tablet (75 mg total) by mouth daily.     cyclobenzaprine 5 MG tablet  Commonly known as:  FLEXERIL  Take 1 tablet (5 mg total) by mouth 3 (three) times daily as needed for muscle spasms.     metoprolol 100 MG tablet  Commonly known as:  LOPRESSOR  Take 1 tablet (100 mg total) by mouth 2 (two) times daily.     Olopatadine HCl 0.2 % Soln  Commonly known as:  PATADAY  Apply 1 drop to eye daily.        Discharge Instructions: Please refer to Patient Instructions section of EMR for full details.  Patient was counseled important signs and symptoms  that should prompt return to medical care, changes in medications, dietary instructions, activity restrictions, and follow up appointments.   Follow-Up Appointments: Follow-up Information    Follow up with Shirlee LatchBacigalupo, [name], MD On 07/20/2014.   Specialty:  Family Medicine   Why:  3:00pm   Contact information:   [address] [place] [address] [phone]       Schedule an appointment as soon as possible for a visit with Lars MassonNELSON, KATARINA H, MD.   Specialty:  Cardiology   Contact information:   [address] [place] [address] [phone]       Ardith Darkaleb M Parker, MD 07/17/2014, 2:32 PM PGY-1, [facility]

## 2014-07-16 NOTE — Progress Notes (Signed)
  Echocardiogram 2D Echocardiogram has been performed.  Leta JunglingCooper, Tehillah Cipriani M 07/16/2014, 9:24 AM

## 2014-07-16 NOTE — Progress Notes (Signed)
Family Medicine Teaching Service Daily Progress Note Intern Pager: [phone]  Patient name: Tammy [name] Medical record number: [id number] Date of birth: June 26, 1955 Age: 59 y.o. Gender: female  Primary Care Provider: Shirlee LatchBacigalupo, [name], MD Consultants: Cardiology Code Status: Full  Pt Overview and Major Events to Date:  5/15 - troponin bump, cardiology consult  Assessment and Plan: Tammy [name] is a 59 y.o. female presenting with chest pain. PMH is significant for HTN, HLD, and tobacco abuse.   ACS. Presented with typical chest pain. Risk factors include HTN, HLD, and tobacco abuse. EKG with nonspecific ST changes. TSH wnl - now on heparin drip, Morphine PRN, ASA, statin, and BB - continue tele - Trop - 0.00-> 0.65-> 1.69-> 2.75 ->2.00 - A1C pending - Cardiology consult - appreciate recs/management  - Cath - planned for today  Left Leg Claudication/PAD.  - ABIs consistent with LLE PAD (ABI 0.32) - On statin and aspirin - PT consulted - Encourage smoking cessation - Consider starting Cilastazol as outpatient after rehab  HTN. At goal - Continue amlodipine and metoprolol  HLD. LDL 148, Tc 223, HDL 52 - Atorvastatin 80mg  daily  Tobacco Abuse - Encourage smoking cessation  FEN/GI: SLIV, Heart healthy diet,  PPx: Heparin for ACS   Disposition: Tele for cardiac work up  Subjective:  No further episodes of chest pain. Doing well this morning. Ready for cath later this morning.   Objective: Temp:  [97.6 F (36.4 C)-98.3 F (36.8 C)] 97.7 F (36.5 C) (05/16 0718) Pulse Rate:  [55-68] 59 (05/16 0718) Resp:  [20-55] 20 (05/16 0718) BP: (115-146)/(52-88) 136/71 mmHg (05/16 0718) SpO2:  [96 %-99 %] 97 % (05/16 0718) Weight:  [349 lb 10.4 oz (158.6 kg)] 349 lb 10.4 oz (158.6 kg) (05/16 0400) Physical Exam: Gen: NAD, alert, cooperative with exam, lying in bed, obese HEENT: NCAT CV: RRR, good S1/S2, no murmur Resp: CTABL, no wheezes, non-labored Abd: SNTND, BS  present, no guarding or organomegaly Ext: no edema, 2+ DP pulse on L, no calf tenderness Neuro: Alert and oriented, No gross deficits  Laboratory/Imaging:  Recent Labs Lab 07/14/14 1514 07/16/14 0353  WBC 5.2 5.1  HGB 12.7 11.6*  HCT 38.1 34.3*  PLT 187 175    Recent Labs Lab 07/14/14 1514 07/16/14 0353  NA 136 135  K 4.1 4.1  CL 103 105  CO2 23 22  BUN 18 18  CREATININE 1.12* 0.92  CALCIUM 9.3 8.7*  GLUCOSE 168* 116*     Recent Labs Lab 07/15/14 0029 07/15/14 0600 07/15/14 2137 07/16/14 0353  TROPONINI 0.65* 1.69* 2.75* 2.00*   TSH 3.481 Lipid Panel: Total Cholesterol 223, TG 115, HDL 52, LDL 148  Right ABI 0.88 Left ABI 0.32  Ardith Darkaleb M Riyah Bardon, MD 07/16/2014, 7:58 AM PGY-1, [facility] FPTS Intern pager: [phone], text pages welcome

## 2014-07-16 NOTE — Progress Notes (Signed)
VASCULAR LAB PRELIMINARY  ARTERIAL  ABI completed:    RIGHT    LEFT    PRESSURE WAVEFORM  PRESSURE WAVEFORM  BRACHIAL 149 triphasic BRACHIAL  triphasic  DP   DP    AT 113 triphasic AT 39 monophasic  PT 131 triphasic PT 48 monophasic  PER   PER    GREAT TOE  NA GREAT TOE  NA    RIGHT LEFT  ABI 0.88 0.32     Alesia BandaBiggs, Zenya Hickam, RVT 07/16/2014, 10:06 AM

## 2014-07-17 LAB — CBC
HEMATOCRIT: 35.3 % — AB (ref 36.0–46.0)
Hemoglobin: 11.6 g/dL — ABNORMAL LOW (ref 12.0–15.0)
MCH: 27.2 pg (ref 26.0–34.0)
MCHC: 32.9 g/dL (ref 30.0–36.0)
MCV: 82.9 fL (ref 78.0–100.0)
Platelets: 176 10*3/uL (ref 150–400)
RBC: 4.26 MIL/uL (ref 3.87–5.11)
RDW: 13.1 % (ref 11.5–15.5)
WBC: 3.9 10*3/uL — ABNORMAL LOW (ref 4.0–10.5)

## 2014-07-17 LAB — BASIC METABOLIC PANEL
Anion gap: 9 (ref 5–15)
BUN: 15 mg/dL (ref 6–20)
CO2: 22 mmol/L (ref 22–32)
CREATININE: 0.91 mg/dL (ref 0.44–1.00)
Calcium: 9.3 mg/dL (ref 8.9–10.3)
Chloride: 106 mmol/L (ref 101–111)
GFR calc Af Amer: >60 mL/min (ref >60)
GLUCOSE: 118 mg/dL — AB (ref 65–99)
POTASSIUM: 4.4 mmol/L (ref 3.5–5.1)
Sodium: 137 mmol/L (ref 135–145)

## 2014-07-17 MED ORDER — ATORVASTATIN CALCIUM 80 MG PO TABS: 80.0000 mg | ORAL_TABLET | Freq: Every day | ORAL | Status: DC

## 2014-07-17 MED ORDER — CLOPIDOGREL BISULFATE 75 MG PO TABS: 75.0000 mg | ORAL_TABLET | Freq: Every day | ORAL | Status: DC

## 2014-07-17 MED ORDER — ASPIRIN 81 MG PO TBEC: 81.0000 mg | DELAYED_RELEASE_TABLET | Freq: Every day | ORAL | Status: DC

## 2014-07-17 MED FILL — Lidocaine HCl Local Preservative Free (PF) Inj 1%: INTRAMUSCULAR | Qty: 30 | Status: AC

## 2014-07-17 MED FILL — Heparin Sodium (Porcine) 2 Unit/ML in Sodium Chloride 0.9%: INTRAMUSCULAR | Qty: 1500 | Status: AC

## 2014-07-17 NOTE — Progress Notes (Signed)
Patient being discharged. I will sent staff message to our scheduler to arrange followup. Also discussed with patient regarding her significant L lower extremity disease noted on ABI, she has some degree of claudication symptom, i have instructed her to followup with her PCP.   SignedAzalee Course, Jomari Bartnik PA Pager: [phone]]

## 2014-07-17 NOTE — Progress Notes (Signed)
Pt discharged home with daughter.  Reviewed discharge instructions and education, all questions answered.  Assessment unchanged from earlier.  

## 2014-07-17 NOTE — Progress Notes (Signed)
Family Medicine Teaching Service Daily Progress Note Intern Pager: [phone]  Patient name: Tammy [name] Medical record number: [id number] Date of birth: 08/26/1955 Age: 59 y.o. Gender: female  Primary Care Provider: Shirlee LatchBacigalupo, [name], MD Consultants: Cardiology Code Status: Full  Pt Overview and Major Events to Date:  5/15 - troponin bump, cardiology consult  Assessment and Plan: Tammy [name] is a 59 y.o. female presenting with chest pain. PMH is significant for HTN, HLD, and tobacco abuse.   NSTEMI. Presented with typical chest pain. Risk factors include HTN, HLD, and tobacco abuse. EKG with nonspecific ST changes. Troponin: 0.00-> 0.65-> 1.69-> 2.75 ->2.00. TSH wnl. S/p cath with no abnormalities.  - Cardiology involved, appreciate assistance.  - Now on plavix, aspirin for 1 year - Continue Atorvastatin and metoprolol - continue tele - A1C 5.6  Left Leg Claudication/PAD.  - ABIs consistent with LLE PAD (ABI 0.32) - On statin and aspirin  - PT consulted - Encourage smoking cessation - Consider starting Cilastazol as outpatient after rehab  HTN. At goal - Continue amlodipine and metoprolol  HLD. LDL 148, Tc 223, HDL 52 - Atorvastatin 80mg  daily  Tobacco Abuse - Encourage smoking cessation  FEN/GI: SLIV, Heart healthy diet,  PPx: Heparin for ACS  Disposition: Tele for cardiac work up. Possible discharge later today.   Subjective:  Had cath yesterday with no complications. No further episodes of chest pain. No shortness of breath. Ready to go home today.   Objective: Temp:  [98.1 F (36.7 C)-98.2 F (36.8 C)] 98.2 F (36.8 C) (05/17 0455) Pulse Rate:  [0-70] 61 (05/17 0719) Resp:  [0-22] 18 (05/17 0455) BP: (118-148)/(38-89) 125/55 mmHg (05/17 0455) SpO2:  [0 %-100 %] 100 % (05/17 0455) Weight:  [348 lb 15.8 oz (158.3 kg)] 348 lb 15.8 oz (158.3 kg) (05/17 0455) Physical Exam: Gen: NAD, alert, cooperative with exam, lying in bed, obese HEENT: NCAT CV:  RRR, good S1/S2, no murmur Resp: CTABL, no wheezes, non-labored Abd: SNTND, BS present, no guarding or organomegaly Ext: no edema, 2+ DP pulse on L, no calf tenderness Neuro: Alert and oriented, No gross deficits  Laboratory/Imaging:  Recent Labs Lab 07/14/14 1514 07/16/14 0353 07/17/14 0247  WBC 5.2 5.1 3.9*  HGB 12.7 11.6* 11.6*  HCT 38.1 34.3* 35.3*  PLT 187 175 176    Recent Labs Lab 07/14/14 1514 07/16/14 0353 07/17/14 0247  NA 136 135 137  K 4.1 4.1 4.4  CL 103 105 106  CO2 23 22 22   BUN 18 18 15   CREATININE 1.12* 0.92 0.91  CALCIUM 9.3 8.7* 9.3  GLUCOSE 168* 116* 118*   Catheterization 1. Diffuse moderate triple vessel CAD without focal culprit lesion for NSTEMI.  2. Moderate stenosis mid RCA which does not appear to be a ruptured plaque and is not flow limiting 3. Moderate stenosis in the small caliber distal LAD which does not appear to be a ruptured plaque and is not flow limiting. 4. Moderate disease in the small caliber third obtuse marginal branch 5. Normal LV function 6. NSTEMI with diffuse moderate CAD but no focal culprit lesion   Echo Study Conclusions - Left ventricle: The cavity size was normal. Wall thickness was normal. Systolic function was normal. The estimated ejection fraction was in the range of 55% to 60%. Wall motion was normal; there were no regional wall motion abnormalities. Left ventricular diastolic function parameters were normal. - Left atrium: The atrium was mildly dilated.  Ardith Darkaleb M Bhavya Eschete, MD 07/17/2014, 8:25 AM PGY-1,  [place] Intern pager: [phone], text pages welcome

## 2014-07-17 NOTE — Discharge Instructions (Signed)
You were admitted to the hospital with chest pain. While here you saw our cardiologists and had a catheterization performed which showed plaques in your heart. We started you on several medications here. You should continue to take these medications. It is also very important for you to stop smoking. We have a smoking cessation clinic at the [facility].   Coronary Artery Disease Coronary artery disease (CAD) is a process in which the heart (coronary) arteries narrow or become blocked from the development of atherosclerosis. Atherosclerosis is a disease in which plaque builds up on the inside of the heart arteries (coronary arteries). Plaque is made up of fats (lipids), cholesterol, calcium, and fibrous tissue. CAD can lead to a heart attack (myocardial infarction, MI). An MI can lead to heart failure, cardiogenic shock, or sudden cardiac death. CAD can cause an MI through:  Plaque buildup that can severely narrow or block the coronary arteries and diminish blood flow.  Plaque that can become unstable and "rupture." Unstable plaque that ruptures within a coronary artery can form a clot and cause a sudden (acute) blockage. RISK FACTORS Many risk factors contribute to the development of CAD. These include:  High cholesterol (dyslipidemia) levels.  High blood pressure (hypertension).  Smoking.  Diabetes.  Age.  Gender. Men can develop CAD earlier in life than women.  Family history.  Inactivity or lack of regular physical or aerobic exercise.  A diet high in saturated fats.  Chronic kidney disease. SYMPTOMS  When a coronary artery is narrowed or blocked, an MI can occur. MI symptoms can include:  Chest pain (agina). Angina can occur by itself or it can also occur with pain in the neck, arm, jaw, or in the upper, middle back (mid-scapular pain).  Profuse sweating (diaphoresis) without physical activity or movement.  Shortness of breath (dyspnea).  Irregular heartbeats (palpitations) that  feel like your heart is skipping beats or is beating very fast.  Nausea.  Epigastric pain. Epigastric pain may occur as "heartburn."  Tiredness (malaise). This can especially be present in the elderly. Women can have different (atypical) symptoms other than classic angina.  DIAGNOSIS  The diagnosis of CAD may include:  An electrocardiography (ECG). An ECG does not diagnose CAD, but it is usefull in the detection of a sudden (acute) MI or as a marker for a previous MI. Depending on which heart (coronary) artery may be blocked, an ECG may not pick up an MI pattern.  Exercise stress test. A stress test can be performed at rest for people who are unable to do an exercise stress test. A stress test will only be abnormal if one or more of the large coronary arteries is significantly blocked.  Blood tests. Tests may include samples to detect heart muscle damage (such as troponin levels). Other tests may include cholesterol checks and an inflammation test (high-sensitivity C-reactive protein, hs-CRP).  Coronary angiography.  Screening people who have peripheral vascular disease (PAD). These people often times have CAD. TREATMENT  The treatment of CAD includes the following:  Lifestyle changes such as:  Following a heart-healthy diet. A registered dietitian can you help educate you on healthy food options and changes.  Quiting smoking.  Following an exercise program approved by your caregiver.  Maintaining a healthy weight. Lose weight as approved by your caregiver.  Medicines to help control your blood pressure, cholesterol level, angina, and blood clotting. Medicines may include beta-blockers, ACE inhibitors, statins, nitrates, and anti-platelet medicines.  If you have a heart stent and are  taking anti-platelet medicine, it is important to not suddenly stop taking this medicine. Suddenly stopping anti-platelet medicine can result in an MI. Talk with your caregiver before stopping medicine  or if you cannot afford your medicine.  If the coronary arteries are significantly blocked, surgery may be needed. This can include:  Percutaneous coronary intervention (PCI) with or without stent placement.  Coronary artery bypass graft surgery (CABG). SEEK IMMEDIATE MEDICAL CARE IF:   You develop MI symptoms. This is a medical emergency. Get help at once. Call your local emergency service (911 in the [place] immediately. Do not drive yourself to the clinic or hospital. MI symptoms can include:  Angina or pain that occurs in the neck, arm, jaw, or in the upper middle back.  Profuse sweating without cause.  Shortness of breath or difficulty breathing without cause.  Unexplained nausea or epigastric pain that feels like heartburn. Document Released: 05/11/2011 Document Reviewed: 06/20/2013 [facility]Exit[facility] Patient Information 2015 [place], [place]. This information is not intended to replace advice given to you by your health care provider. Make sure you discuss any questions you have with your health care provider.  Smoking Cessation, Tips for Success If you are ready to quit smoking, congratulations! You have chosen to help yourself be healthier. Cigarettes bring nicotine, tar, carbon monoxide, and other irritants into your body. Your lungs, heart, and blood vessels will be able to work better without these poisons. There are many different ways to quit smoking. Nicotine gum, nicotine patches, a nicotine inhaler, or nicotine nasal spray can help with physical craving. Hypnosis, support groups, and medicines help break the habit of smoking. WHAT THINGS CAN I DO TO MAKE QUITTING EASIER?  Here are some tips to help you quit for good:  Pick a date when you will quit smoking completely. Tell all of your friends and family about your plan to quit on that date.  Do not try to slowly cut down on the number of cigarettes you are smoking. Pick a quit date and quit smoking completely starting on that  day.  Throw away all cigarettes.   Clean and remove all ashtrays from your home, work, and car.  On a card, write down your reasons for quitting. Carry the card with you and read it when you get the urge to smoke.  Cleanse your body of nicotine. Drink enough water and fluids to keep your urine clear or pale yellow. Do this after quitting to flush the nicotine from your body.  Learn to predict your moods. Do not let a bad situation be your excuse to have a cigarette. Some situations in your life might tempt you into wanting a cigarette.  Never have "just one" cigarette. It leads to wanting another and another. Remind yourself of your decision to quit.  Change habits associated with smoking. If you smoked while driving or when feeling stressed, try other activities to replace smoking. Stand up when drinking your coffee. Brush your teeth after eating. Sit in a different chair when you read the paper. Avoid alcohol while trying to quit, and try to drink fewer caffeinated beverages. Alcohol and caffeine may urge you to smoke.  Avoid foods and drinks that can trigger a desire to smoke, such as sugary or spicy foods and alcohol.  Ask people who smoke not to smoke around you.  Have something planned to do right after eating or having a cup of coffee. For example, plan to take a walk or exercise.  Try a relaxation exercise to calm  you down and decrease your stress. Remember, you may be tense and nervous for the first 2 weeks after you quit, but this will pass.  Find new activities to keep your hands busy. Play with a pen, coin, or rubber band. Doodle or draw things on paper.  Brush your teeth right after eating. This will help cut down on the craving for the taste of tobacco after meals. You can also try mouthwash.   Use oral substitutes in place of cigarettes. Try using lemon drops, carrots, cinnamon sticks, or chewing gum. Keep them handy so they are available when you have the urge to  smoke.  When you have the urge to smoke, try deep breathing.  Designate your home as a nonsmoking area.  If you are a heavy smoker, ask your health care provider about a prescription for nicotine chewing gum. It can ease your withdrawal from nicotine.  Reward yourself. Set aside the cigarette money you save and buy yourself something nice.  Look for support from others. Join a support group or smoking cessation program. Ask someone at home or at work to help you with your plan to quit smoking.  Always ask yourself, "Do I need this cigarette or is this just a reflex?" Tell yourself, "Today, I choose not to smoke," or "I do not want to smoke." You are reminding yourself of your decision to quit.  Do not replace cigarette smoking with electronic cigarettes (commonly called e-cigarettes). The safety of e-cigarettes is unknown, and some may contain harmful chemicals.  If you relapse, do not give up! Plan ahead and think about what you will do the next time you get the urge to smoke. HOW WILL I FEEL WHEN I QUIT SMOKING? You may have symptoms of withdrawal because your body is used to nicotine (the addictive substance in cigarettes). You may crave cigarettes, be irritable, feel very hungry, cough often, get headaches, or have difficulty concentrating. The withdrawal symptoms are only temporary. They are strongest when you first quit but will go away within 10-14 days. When withdrawal symptoms occur, stay in control. Think about your reasons for quitting. Remind yourself that these are signs that your body is healing and getting used to being without cigarettes. Remember that withdrawal symptoms are easier to treat than the major diseases that smoking can cause.  Even after the withdrawal is over, expect periodic urges to smoke. However, these cravings are generally short lived and will go away whether you smoke or not. Do not smoke! WHAT RESOURCES ARE AVAILABLE TO HELP ME QUIT SMOKING? Your health care  provider can direct you to community resources or hospitals for support, which may include:  Group support.  Education.  Hypnosis.  Therapy. Document Released: 11/15/2003 Document Revised: 07/03/2013 Document Reviewed: 08/04/2012 [facility] Patient Information 2015 [place], [place]. This information is not intended to replace advice given to you by your health care provider. Make sure you discuss any questions you have with your health care provider.

## 2014-07-17 NOTE — Evaluation (Signed)
Physical Therapy Evaluation Patient Details Name: Tammy [name] MRN: [id number] DOB: 10/25/1955 Today's Date: 07/17/2014   History of Present Illness  Tammy [name] is a 59 y.o. female presenting with chest pain, positive for NSTEMI. PMH is significant for HTN, HLD, and tobacco abuse.   Clinical Impression  Patient presents with limited mobility due to claudication symptoms left LE.  She is eager to get out and walk and feel outpatient PT follow up indicated for education on balance HEP and use of rollator which is recommended to assist with community mobility. All can be set up as outpatient as d/c in progress and patient eager to get home.    Follow Up Recommendations Outpatient PT    Equipment Recommendations  Other (comment) (feel rollator walker indicated for community mobility with pt h/o claudication)    Recommendations for Other Services       Precautions / Restrictions Precautions Precautions: Fall      Mobility  Bed Mobility               General bed mobility comments: NT pt sitting edge of bed eager for d/c  Transfers Overall transfer level: Modified independent                  Ambulation/Gait Ambulation/Gait assistance: Modified independent (Device/Increase time) Ambulation Distance (Feet): 150 Feet Assistive device:  (holding wall rail intermittently) Gait Pattern/deviations: Step-through pattern;Wide base of support     General Gait Details: demonstrates veering from straight path; mild dyspnea and audible wheezing with ambulation  Stairs            Wheelchair Mobility    Modified Rankin (Stroke Patients Only)       Balance Overall balance assessment: Needs assistance   Sitting balance-Leahy Scale: Normal       Standing balance-Leahy Scale: Good Standing balance comment: balance deficits evident with ambulation due to holding wall rail and veering from straight path; evaluation limited as pt in process of d/c with RN  arriving to give d/c paperwork                             Pertinent Vitals/Pain Pain Assessment: No/denies pain    Home Living Family/patient expects to be discharged to:: Private residence Living Arrangements: Alone Available Help at Discharge: Family;Available PRN/intermittently Type of Home: Apartment Home Access: Level entry     Home Layout: One level Home Equipment: None      Prior Function Level of Independence: Needs assistance   Gait / Transfers Assistance Needed: reports doesn't go out to walk unless her grandchildren are there and then she walks with one arm around one of them; has to stop at times due to left LE pain from poor circulation; independent in her small apartment           Hand Dominance        Extremity/Trunk Assessment               Lower Extremity Assessment: LLE deficits/detail   LLE Deficits / Details: AROM WFL, strenth hip flexion 3+/5, otherwise WFL     Communication   Communication: No difficulties  Cognition Arousal/Alertness: Awake/alert Behavior During Therapy: WFL for tasks assessed/performed Overall Cognitive Status: Within Functional Limits for tasks assessed                      General Comments General comments (skin integrity, edema, etc.): Discussed with patient plan  for PT follow up set up through her PCP as she reports planned visit to MD next Wednesday    Exercises        Assessment/Plan    PT Assessment All further PT needs can be met in the next venue of care  PT Diagnosis Abnormality of gait   PT Problem List Decreased activity tolerance;Decreased balance;Decreased knowledge of use of DME;Decreased mobility  PT Treatment Interventions     PT Goals (Current goals can be found in the Care Plan section) Acute Rehab PT Goals PT Goal Formulation: All assessment and education complete, DC therapy    Frequency     Barriers to discharge        Co-evaluation               End  of Session   Activity Tolerance: Patient tolerated treatment well Patient left: in bed;with call bell/phone within reach;with nursing/sitter in room           Time: 0950-1014 PT Time Calculation (min) (ACUTE ONLY): 24 min   Charges:   PT Evaluation $Initial PT Evaluation Tier I: 1 Procedure PT Treatments $Self Care/Home Management: 8-22   PT G Codes:        Tammy [name],Tammy [name] 08-04-14, 10:36 AM  Tammy [name], Tammy [name] 2014/08/04

## 2014-07-20 ENCOUNTER — Ambulatory Visit (INDEPENDENT_AMBULATORY_CARE_PROVIDER_SITE_OTHER): Payer: Medicaid Other | Admitting: Family Medicine

## 2014-07-20 ENCOUNTER — Encounter: Payer: Self-pay | Admitting: Family Medicine

## 2014-07-20 VITALS — BP 138/76 | Temp 98.0°F | Ht 66.0 in | Wt 345.7 lb

## 2014-07-20 DIAGNOSIS — I739 Peripheral vascular disease, unspecified: Secondary | ICD-10-CM

## 2014-07-20 DIAGNOSIS — E669 Obesity, unspecified: Secondary | ICD-10-CM | POA: Diagnosis not present

## 2014-07-20 DIAGNOSIS — M25512 Pain in left shoulder: Secondary | ICD-10-CM | POA: Diagnosis not present

## 2014-07-20 DIAGNOSIS — I214 Non-ST elevation (NSTEMI) myocardial infarction: Secondary | ICD-10-CM

## 2014-07-20 DIAGNOSIS — F17200 Nicotine dependence, unspecified, uncomplicated: Secondary | ICD-10-CM

## 2014-07-20 DIAGNOSIS — E785 Hyperlipidemia, unspecified: Secondary | ICD-10-CM | POA: Diagnosis not present

## 2014-07-20 DIAGNOSIS — Z720 Tobacco use: Secondary | ICD-10-CM

## 2014-07-20 DIAGNOSIS — I10 Essential (primary) hypertension: Secondary | ICD-10-CM

## 2014-07-20 MED ORDER — CLOPIDOGREL BISULFATE 75 MG PO TABS: 75.0000 mg | ORAL_TABLET | Freq: Every day | ORAL | Status: DC

## 2014-07-20 MED ORDER — ASPIRIN 81 MG PO TBEC: 81.0000 mg | DELAYED_RELEASE_TABLET | Freq: Every day | ORAL | Status: DC

## 2014-07-20 MED ORDER — AMLODIPINE BESYLATE 5 MG PO TABS: 5.0000 mg | ORAL_TABLET | Freq: Every day | ORAL | Status: DC

## 2014-07-20 MED ORDER — ATORVASTATIN CALCIUM 80 MG PO TABS: 80.0000 mg | ORAL_TABLET | Freq: Every day | ORAL | Status: DC

## 2014-07-20 MED ORDER — TRAMADOL HCL 50 MG PO TABS: 50.0000 mg | ORAL_TABLET | Freq: Three times a day (TID) | ORAL | Status: DC | PRN

## 2014-07-20 NOTE — Assessment & Plan Note (Signed)
Stressed importance of smoking cessation to patient Patient currently not interested in quitting as she feels she should work on one thing at a time Follow-up in one month to discuss smoking cessation again Consider Wellbutrin 150 mg daily for 3 days and then 150 mg twice a day to assist with smoking cessation

## 2014-07-20 NOTE — Assessment & Plan Note (Signed)
Continue atorvastatin 80 mg daily Refill sent to pharmacy

## 2014-07-20 NOTE — Assessment & Plan Note (Signed)
Some concern that this could be an angina equivalent in this obese female EKG unchanged from previous and nonischemic in clinic today Symptoms also more consistent with bursitis or muscular pain Advised patient not to take NSAIDs currently Tylenol or tramadol when necessary for pain Return precautions given

## 2014-07-20 NOTE — Patient Instructions (Signed)
Nice to see you again today. I'm glad that you're feeling much better.  For your shoulder pain, your EKG looks normal and I don't think this is related to your heart. I think this could be a muscle pain. This close to your heart attack, you should avoid Aleve, ibuprofen and the like. You can try tramadol as needed for pain control.  For your leg pain, the most important thing you can do is quit smoking. With the tests that they did on the leg in the hospital, is very concerning that your arteries have a lot of plaque in them. I will refer you to physical therapy as well  Continue take all of your medications that you left the hospital taking. I sent refills to your pharmacy.  Come back to see me in one month to talk about quitting smoking again.  Take care, Dr. B   Smoking Cessation, Tips for Success If you are ready to quit smoking, congratulations! You have chosen to help yourself be healthier. Cigarettes bring nicotine, tar, carbon monoxide, and other irritants into your body. Your lungs, heart, and blood vessels will be able to work better without these poisons. There are many different ways to quit smoking. Nicotine gum, nicotine patches, a nicotine inhaler, or nicotine nasal spray can help with physical craving. Hypnosis, support groups, and medicines help break the habit of smoking. WHAT THINGS CAN I DO TO MAKE QUITTING EASIER?  Here are some tips to help you quit for good:  Pick a date when you will quit smoking completely. Tell all of your friends and family about your plan to quit on that date.  Do not try to slowly cut down on the number of cigarettes you are smoking. Pick a quit date and quit smoking completely starting on that day.  Throw away all cigarettes.   Clean and remove all ashtrays from your home, work, and car.  On a card, write down your reasons for quitting. Carry the card with you and read it when you get the urge to smoke.  Cleanse your body of nicotine. Drink  enough water and fluids to keep your urine clear or pale yellow. Do this after quitting to flush the nicotine from your body.  Learn to predict your moods. Do not let a bad situation be your excuse to have a cigarette. Some situations in your life might tempt you into wanting a cigarette.  Never have "just one" cigarette. It leads to wanting another and another. Remind yourself of your decision to quit.  Change habits associated with smoking. If you smoked while driving or when feeling stressed, try other activities to replace smoking. Stand up when drinking your coffee. Brush your teeth after eating. Sit in a different chair when you read the paper. Avoid alcohol while trying to quit, and try to drink fewer caffeinated beverages. Alcohol and caffeine may urge you to smoke.  Avoid foods and drinks that can trigger a desire to smoke, such as sugary or spicy foods and alcohol.  Ask people who smoke not to smoke around you.  Have something planned to do right after eating or having a cup of coffee. For example, plan to take a walk or exercise.  Try a relaxation exercise to calm you down and decrease your stress. Remember, you may be tense and nervous for the first 2 weeks after you quit, but this will pass.  Find new activities to keep your hands busy. Play with a pen, coin, or rubber band.  Doodle or draw things on paper.  Brush your teeth right after eating. This will help cut down on the craving for the taste of tobacco after meals. You can also try mouthwash.   Use oral substitutes in place of cigarettes. Try using lemon drops, carrots, cinnamon sticks, or chewing gum. Keep them handy so they are available when you have the urge to smoke.  When you have the urge to smoke, try deep breathing.  Designate your home as a nonsmoking area.  If you are a heavy smoker, ask your health care provider about a prescription for nicotine chewing gum. It can ease your withdrawal from nicotine.  Reward  yourself. Set aside the cigarette money you save and buy yourself something nice.  Look for support from others. Join a support group or smoking cessation program. Ask someone at home or at work to help you with your plan to quit smoking.  Always ask yourself, "Do I need this cigarette or is this just a reflex?" Tell yourself, "Today, I choose not to smoke," or "I do not want to smoke." You are reminding yourself of your decision to quit.  Do not replace cigarette smoking with electronic cigarettes (commonly called e-cigarettes). The safety of e-cigarettes is unknown, and some may contain harmful chemicals.  If you relapse, do not give up! Plan ahead and think about what you will do the next time you get the urge to smoke. HOW WILL I FEEL WHEN I QUIT SMOKING? You may have symptoms of withdrawal because your body is used to nicotine (the addictive substance in cigarettes). You may crave cigarettes, be irritable, feel very hungry, cough often, get headaches, or have difficulty concentrating. The withdrawal symptoms are only temporary. They are strongest when you first quit but will go away within 10-14 days. When withdrawal symptoms occur, stay in control. Think about your reasons for quitting. Remind yourself that these are signs that your body is healing and getting used to being without cigarettes. Remember that withdrawal symptoms are easier to treat than the major diseases that smoking can cause.  Even after the withdrawal is over, expect periodic urges to smoke. However, these cravings are generally short lived and will go away whether you smoke or not. Do not smoke! WHAT RESOURCES ARE AVAILABLE TO HELP ME QUIT SMOKING? Your health care provider can direct you to community resources or hospitals for support, which may include:  Group support.  Education.  Hypnosis.  Therapy. Document Released: 11/15/2003 Document Revised: 07/03/2013 Document Reviewed: 08/04/2012 [facility] Patient  Information 2015 [employer] [place]. This information is not intended to replace advice given to you by your health care provider. Make sure you discuss any questions you have with your health care provider.

## 2014-07-20 NOTE — Assessment & Plan Note (Addendum)
Chest pain resolved EKG in clinic unchanged from previous and nonischemic Patient is following up with cardiology in one month Continue aspirin and Plavix for at least one year Stressed importance of weight loss and smoking cessation

## 2014-07-20 NOTE — Progress Notes (Signed)
Subjective:   Tammy [name] is a [age] y.o. female with a history of HTN, HLD here for hospital f/u.  NSTEMI, angina: - Recently hospitalized 5/14-5/17 for NSTEMI - seen by cardiology who performed Cath which revealed moderate stenosis in several arteries with no focal lesion, no stents placed - Patient started on aspirin and Plavix with a plan to continue dual antiplatelet therapy for at least a year - Patient also started on atorvastatin 80 mg daily for risk reduction - Patient reports compliance with her medications - has appt with Cardiology on 6/17 for f/u. - CP has resolved, denies any SOB, diapheresis, nausea - feels like there is a BP cuff on L arm currently - no numbness/tingling, started yesterday, constant - trying to change her eating habits and eat healthier to work on weight loss - planning on seeing nutrition 6/2  Smoking:  - getting ready to work on cessation - wants to eventually quit, but she isnt ready yet, because it will be too difficult - doesn't smoke a whole cigarette at a time anymore - thinks she needs medications or tools, but thinks she needs to focus on weight loss currently and then start on smoking cessation.  - she thinks she can only work on one change at a time  L leg PAD: - pain in L leg limits walking - PT saw her in the hospital and recommend outpatient PT - patient agreeable to starting outpatient PT  Of note, patient thinks that morphine broke her hair off  Review of Systems:  Per HPI. All other systems reviewed and are negative.   PMH, PSH, Medications, Allergies, and FmHx reviewed and updated in EMR.  Social History: current smoker  Objective:  BP 138/76 mmHg  Temp(Src) 98 F (36.7 C) (Oral)  Ht 5\' 6"  (1.676 m)  Wt 345 lb 11.2 oz (156.808 kg)  BMI 55.82 kg/m2  Gen:  59 y.o. female in NAD HEENT: NCAT, MMM, EOMI, PERRL, anicteric sclerae CV: RRR, no MRG, 2+ peripheral pulses in R foot, but faint DP on L foot Resp: Non-labored,  CTAB, no wheezes noted Ext: WWP, no edema MSK: Full ROM, strength intact in L arm and shoulder. TTP over L deltoid Neuro: Alert and oriented, speech normal      Chemistry      Component Value Date/Time   NA 137 07/17/2014 0247   K 4.4 07/17/2014 0247   CL 106 07/17/2014 0247   CO2 22 07/17/2014 0247   BUN 15 07/17/2014 0247   CREATININE 0.91 07/17/2014 0247   CREATININE 1.05 11/14/2013 1204      Component Value Date/Time   CALCIUM 9.3 07/17/2014 0247   ALKPHOS 86 11/02/2008 2007   AST 14 11/02/2008 2007   ALT 9 11/02/2008 2007   BILITOT 0.5 11/02/2008 2007      Lab Results  Component Value Date   WBC 3.9* 07/17/2014   HGB 11.6* 07/17/2014   HCT 35.3* 07/17/2014   MCV 82.9 07/17/2014   PLT 176 07/17/2014   Lab Results  Component Value Date   TSH 3.481 07/15/2014   Lab Results  Component Value Date   HGBA1C 5.6 07/15/2014    EKG: Sinus brady, nonischemic, unchanged from previous  Assessment:     Tammy FusCheryl A [name] is a 59 y.o. female here for hospital f/u.    Plan:     See problem list for problem-specific plans.   Erasmo DownerAngela M Mackie Goon, MD PGY-1,  [facility] 07/20/2014  3:40 PM

## 2014-07-20 NOTE — Assessment & Plan Note (Signed)
Currently at goal Continue amlodipine 5 mg daily and metoprolol 100 mg twice a day Patient will be following up with cardiology in one month

## 2014-07-20 NOTE — Assessment & Plan Note (Signed)
Patient continues to report claudication symptoms in left leg Stressed importance of smoking cessation Referral placed to physical therapy

## 2014-07-20 NOTE — Assessment & Plan Note (Signed)
Chest importance of weight loss for patient's cardiovascular risks Patient follow-up with Dr. Gerilyn PilgrimSykes in 2 weeks Continued follow-up

## 2014-07-24 ENCOUNTER — Telehealth: Payer: Self-pay | Admitting: Family Medicine

## 2014-07-24 DIAGNOSIS — B379 Candidiasis, unspecified: Secondary | ICD-10-CM

## 2014-07-24 NOTE — Telephone Encounter (Signed)
Pt called and would like to speak to Dr. Beryle FlockBacigalupo about her symptoms. SHe thinks she has a yeast infection from taking so many medications. Please call to let her know what she can do or by OTC to help with this. jw

## 2014-07-25 NOTE — Telephone Encounter (Signed)
Pt is calling to check the status of her request for medication to help her yeast infections. jw

## 2014-07-26 NOTE — Progress Notes (Signed)
I was preceptor for this office visit.  

## 2014-07-27 MED ORDER — FLUCONAZOLE 150 MG PO TABS: 150.0000 mg | ORAL_TABLET | Freq: Once | ORAL | Status: DC

## 2014-07-27 NOTE — Telephone Encounter (Signed)
Pt called and after confirming two pt identifiers pt states that she has had vaginal itching for one week. Has not tried OTC meds. Will given diflucan x1 and pt was advised on availability of OTC creams such as monistat which can be very helpful and remove need to call for diflucan with symptoms. She stated her understanding and agreement with this plan  Julina Altmann A. Kennon RoundsHaney MD, MS Family Medicine Resident PGY-1

## 2014-07-31 ENCOUNTER — Ambulatory Visit: Payer: Medicaid Other | Admitting: Family Medicine

## 2014-08-02 ENCOUNTER — Encounter: Payer: Self-pay | Admitting: Family Medicine

## 2014-08-02 ENCOUNTER — Ambulatory Visit (INDEPENDENT_AMBULATORY_CARE_PROVIDER_SITE_OTHER): Payer: Medicaid Other | Admitting: Family Medicine

## 2014-08-02 VITALS — Ht 66.0 in | Wt 343.2 lb

## 2014-08-02 DIAGNOSIS — E669 Obesity, unspecified: Secondary | ICD-10-CM

## 2014-08-02 DIAGNOSIS — I10 Essential (primary) hypertension: Secondary | ICD-10-CM | POA: Diagnosis present

## 2014-08-02 NOTE — Patient Instructions (Addendum)
Call if appointment time needs to be changed. [phone]. Dr Gerilyn PilgrimSykes.  1. Eat at least 3 meals and 1-2 snacks per day.  Aim for no more than 5 hours between eating.  Eat breakfast within one hour of getting up.  2. Add frozen vegetables to dinner each day. 3. Eat protein with each meal. 4. Trying low fat options for condiments. 5. Make a list of 7-10 meals that taste good, are relatively quick and easy to prepare, and that meet your nutritional needs.  Use this as a basis for shopping, so you can make one of these meals any time.  Bring your list to your follow-up appointment for review.   6. Add carrots as a snack each day  Breakfast options:  1. Oatmeal (limit to 2/3 cup dry), add 1 cup of fruit to oatmeal, add 2 tbsp nuts to oatmeal 2. 2 eggs, slice of toast/bagel, 1 cup of fruit 3. Peanut butter sandwich or Mal[place]key sandwich, 1 cup of fruit  Lunch/Dinner options: 1. Sandwich with deli meat, apple sauce, 1 cup of raw or frozen vegetables (can use onion or garlic powder), water 2. Liver and onions, 1 cup vegetable  3. Peanut butter crackers, 1 cup carrots, apple sauce

## 2014-08-02 NOTE — Progress Notes (Signed)
Patient ID: Tammy [name], female   DOB: [birth year], 59 y.o.   MRN: [id number] Nutrition Clinic Visit  Pt seen in nutrition clinic with Dr. Gerilyn PilgrimSykes. Pt presents to discuss nutrition in light of her CAD.  Goal for coming to nutrition clinic: wants information to help eat properly given her recent health issues. Wants ideas for meals and how to get her self to cook.   Eating pattern: 2 meals per day, 0-2 snacks per day  Foods/beverages frequently in diet:  Water, oatmeal 3 times a week with tsp butter and sugar, eggs, processed meats  Physical activity: nothing at this time, due to left leg pain (artery blockage), walks around apartment, moves throughout the day, though no specific exercise  24-hr recall: (Up at 8 AM) B (9 AM)-   Bacon 3 strips, 2 eggs, 1 piece of bread, water Snk -    none L -    none Snk -    none D (7 PM)-   One hot dog on a bun, water Snk (10 PM)-   Cup of oreos - thin mint type oreos, water Typical day? No. This more than she usually eats.   Recommendations:  1. Eat at least 3 meals and 1-2 snacks per day.  Aim for no more than 5 hours between eating.  Eat breakfast within one hour of getting up.  2. Add frozen vegetables to dinner each day. 3. Eat protein with each meal. 4. Trying low fat options for condiments. 5. Make a list of 7-10 meals that taste good, are relatively quick and easy to prepare, and that meet your nutritional needs.  Use this as a basis for shopping, so you can make one of these meals any time.  Bring your list to your follow-up appointment for review.   6. Add carrots as a snack each day  Breakfast options:  1. Oatmeal (limit to 2/3 cup dry), add 1 cup of fruit to oatmeal, add 2 tbsp nuts to oatmeal 2. 2 eggs, slice of toast/bagel, 1 cup of fruit 3. Peanut butter sandwich or [place] sandwich, 1 cup of fruit  Lunch/Dinner options: 1. Sandwich with deli meat, apple sauce, 1 cup of raw or frozen vegetables (can use onion or garlic powder),  water 2. Liver and onions, 1 cup vegetable   Follow up 1 month.  Tammy AlarEric Debanhi Blaker, MD Family Medicine Resident PGY-3   60 minutes of direct face-to-face time was spent with the patient with at least 50% of this time spent counseling the patient.

## 2014-08-03 NOTE — Progress Notes (Signed)
I was preceptor the day of this visit.   

## 2014-08-17 ENCOUNTER — Encounter: Payer: Self-pay | Admitting: Nurse Practitioner

## 2014-08-17 ENCOUNTER — Ambulatory Visit (INDEPENDENT_AMBULATORY_CARE_PROVIDER_SITE_OTHER): Payer: Medicaid Other | Admitting: Nurse Practitioner

## 2014-08-17 VITALS — BP 110/82 | HR 53 | Ht 66.0 in | Wt 345.8 lb

## 2014-08-17 DIAGNOSIS — I222 Subsequent non-ST elevation (NSTEMI) myocardial infarction: Secondary | ICD-10-CM | POA: Diagnosis not present

## 2014-08-17 DIAGNOSIS — Z720 Tobacco use: Secondary | ICD-10-CM

## 2014-08-17 DIAGNOSIS — I10 Essential (primary) hypertension: Secondary | ICD-10-CM | POA: Diagnosis not present

## 2014-08-17 DIAGNOSIS — I214 Non-ST elevation (NSTEMI) myocardial infarction: Secondary | ICD-10-CM

## 2014-08-17 DIAGNOSIS — I739 Peripheral vascular disease, unspecified: Secondary | ICD-10-CM

## 2014-08-17 DIAGNOSIS — I2510 Atherosclerotic heart disease of native coronary artery without angina pectoris: Secondary | ICD-10-CM | POA: Diagnosis not present

## 2014-08-17 DIAGNOSIS — E785 Hyperlipidemia, unspecified: Secondary | ICD-10-CM | POA: Diagnosis not present

## 2014-08-17 MED ORDER — ATORVASTATIN CALCIUM 80 MG PO TABS: 80.0000 mg | ORAL_TABLET | Freq: Every day | ORAL | Status: DC

## 2014-08-17 MED ORDER — NITROGLYCERIN 0.4 MG SL SUBL: 0.4000 mg | SUBLINGUAL_TABLET | SUBLINGUAL | Status: DC | PRN

## 2014-08-17 MED ORDER — CLOPIDOGREL BISULFATE 75 MG PO TABS: 75.0000 mg | ORAL_TABLET | Freq: Every day | ORAL | Status: DC

## 2014-08-17 NOTE — Patient Instructions (Addendum)
Medication Instructions:  Your physician has recommended you make the following change in your medication:  1- Nitroglycerin 0.4 mg SL for chest pain Take 1 NTG, under your tongue, while sitting. If no relief of pain may repeat NTG, one tab every 5 minutes up to 3 tablets total over 15 minutes. If no relief CALL 911. If you have dizziness/lightheadness while taking NTG, stop taking and call 911.  Labwork: NONE  Testing/Procedures: NONE  Follow-Up: Your physician recommends that you schedule a follow-up appointment ASAP with Dr. Allyson Sabal or Dr. Kirke Corin for LLE clotication.    Any Other Special Instructions Will Be Listed Below (If Applicable).

## 2014-08-17 NOTE — Progress Notes (Signed)
Patient Name: Tammy [name] Date of Encounter: 08/17/2014  Primary Care Provider:  Shirlee Latch, MD Primary Cardiologist:  Eloy End, MD   Chief Complaint  59 year old female status post recent non-ST elevation MI who presents for hospital follow-up.  Past Medical History   Past Medical History  Diagnosis Date  . Ulcer   . GERD (gastroesophageal reflux disease)   . Essential hypertension   . Abnormal mammogram 10.25.11    area of density with several adjacent amorphous calcifications located laterally w/in the right breast at approx 9-10 o'clock position likely represents area of evolving far necrosis; f/u mamo and U/S in 6 months  . Morbid obesity   . CAD (coronary artery disease)     a. 07/2014 NSTEMI/Cath: LM nl, LAD 30ost/m, 60d, LCX 20p, OM1 20, OM2 40, OM3 50, RCA 30p, 15m, 20d, RPDA 40, EF 65%-->Med Rx;  b. 07/2014 Echo: EF 55-60%, no rwma, mildly dil LA.  Marland Kitchen PAD (peripheral artery disease)     a. 07/2014 ABI's: R 0.88, L 0.32.  Marland Kitchen Hyperlipidemia    Past Surgical History  Procedure Laterality Date  . Cesarean section    . Upper gastrointestinal endoscopy  1991  . Trigger finger release Left     middle finger  . Cardiac catheterization N/A 07/16/2014    Procedure: Left Heart Cath and Coronary Angiography;  Surgeon: Kathleene Hazel, MD;  Location: [facility] INVASIVE CV LAB;  Service: Cardiovascular;  Laterality: N/A;    Allergies  Allergies  Allergen Reactions  . Ace Inhibitors     REACTION: rash on breast  . Hydrochlorothiazide     REACTION: "rash on breast and breast pain"    HPI  59 year old female with the above problem list. She was recently admitted to [facility] secondary to substernal chest pain with radiation down the left arm. She ruled in for myocardial infarction. Echo showed normal LV function. Diagnostic catheterization was performed and revealed moderate nonobstructive CAD with normal LV function. Medical therapy was recommended. She also  complained of a long history of left lower extremity claudication. ABIs were performed and were abnormal with an ABI of 0.88 on the right and 0.32 on the left. She was subsequently discharged by the family medicine team. Since her discharge, she has not had any recurrence of chest pain. Her ambulation is limited by ongoing left calf claudication after walking somewhere between 25 and 50 yards. This has limited her ability to begin an exercise regimen. She also continues to smoke and has cut back from 10 cigarettes a day to 4 cigarettes per day. She says that she has a lot going on and has stress and anxiety related to her new diagnoses and medications. She is not sure she'll be able to commit to quitting smoking in the near future. She denies PND, orthopnea, dizziness, syncope, edema, or early satiety.  Home Medications  Prior to Admission medications   Medication Sig Start Date End Date Taking? Authorizing Provider  amLODipine (NORVASC) 5 MG tablet Take 1 tablet (5 mg total) by mouth daily. 07/20/14  Yes Erasmo Downer, MD  aspirin 81 MG EC tablet Take 1 tablet (81 mg total) by mouth daily. 07/20/14  Yes Erasmo Downer, MD  atorvastatin (LIPITOR) 80 MG tablet Take 1 tablet (80 mg total) by mouth daily at 6 PM. 07/20/14  Yes Erasmo Downer, MD  clopidogrel (PLAVIX) 75 MG tablet Take 1 tablet (75 mg total) by mouth daily. 07/20/14  Yes Erasmo Downer, MD  fluconazole (DIFLUCAN) 150 MG tablet Take 1 tablet (150 mg total) by mouth once. 07/27/14  Yes Alyssa A Kennon Rounds, MD  metoprolol (LOPRESSOR) 100 MG tablet Take 1 tablet (100 mg total) by mouth 2 (two) times daily. 06/27/14  Yes Erasmo Downer, MD  traMADol (ULTRAM) 50 MG tablet Take 50 mg by mouth every 8 (eight) hours as needed (pain).   Yes Historical Provider, MD    Review of Systems  Left lower extremity claudication as outlined above. She has not been having any chest pain, dyspnea, PND, orthopnea, dizziness, syncope, edema, or  early satiety. she continues to smoke 4 cigarettes per day.  All other systems reviewed and are otherwise negative except as noted above.  Physical Exam  VS:  BP 110/82 mmHg  Pulse 53  Ht  (1.676 m)  Wt 345 lb 12.8 oz (156.854 kg)  BMI 55.84 kg/m2  SpO2 97% , BMI Body mass index is 55.84 kg/(m^2). GEN: Pleasant morbidly obese female, in no acute distress. HEENT: normal. Neck: Supple, no carotid bruits, or masses. Difficult to gauge JVP secondary to girth. Cardiac: RRR, distant, no murmurs, rubs, or gallops. No clubbing, cyanosis, edema.  Radials/DP/PT 2+ and equal bilaterally. Right wrist catheterization site is without bleeding, bruit, or hematoma. Respiratory:  Respirations regular and unlabored, clear to auscultation bilaterally. GI: Soft, nontender, nondistended, BS + x 4. MS: no deformity or atrophy. Skin: warm and dry, no rash. Neuro:  Strength and sensation are intact. Psych: Normal affect.  Accessory Clinical Findings  ECG - sinus bradycardia, 53, nonspecific ST-T changes.  Assessment & Plan  1.  Non-ST segment elevation myocardial infarction, subsequent episode of care/CAD: Status post recent admission and catheterization revealing moderate nonobstructive CAD. LV function was normal. She has not had any recurrence of chest pain or dyspnea since discharge. She remains on aspirin, Plavix, statin, and beta blocker therapy. She does not currently have a prescription for sublingual nitroglycerin and we will provide this today. She continues to smoke and understands that she needs to quit but cannot provide a timeline for when she is planning on quitting. She does not think that she will partake in cardiac rehabilitation in the setting of persistent left calf claudication.  2. Left lower extremity claudication/peripheral arterial disease: This has been going on for some time and she had a markedly abnormal ABI while hospitalized. ABI was 0.32 at the posterior tibial on the left.  This significantly impacts her lifestyle as she is only able to walk somewhere between 25 and 50 yards prior to expressing symptoms. I will arrange for her to follow-up and peripheral vascular clinic with consideration for lower extremity angiography if appropriate. She remains on aspirin, statin, and Plavix therapy in the setting of recent non-STEMI.  3. Essential hypertension: Stable on beta blocker and calcium channel blocker therapy.  4. Hyperlipidemia: Currently on Lipitor 80 mg. LDL was 148. She will require follow-up lipids and LFTs in 6-8 weeks.  5. Morbid obesity: We talked about the importance of cardiac rehabilitation and exercise as part of her recovery regimen. Right now activity is significantly limited by lower extremity claudication on the left. I suspect we will have to have this managed prior to her being able to effectively exercise. Referral to [facility] clinic as above.  6. Tobacco abuse: She is cut back from 10 cigarettes day to 4 cigarettes a day. We discussed the importance of complete cessation and I have asked her to identify a day on her calendar, circle, and begin  working towards quitting by that day. She is not willing to commit to this.  7. Disposition: Follow-up and peripheral vascular clinic as soon as this can be arranged as at this point claudication is her against issue.  Nicolasa Ducking, NP 08/17/2014, 3:16 PM

## 2014-08-21 ENCOUNTER — Ambulatory Visit (INDEPENDENT_AMBULATORY_CARE_PROVIDER_SITE_OTHER): Payer: Medicaid Other | Admitting: Cardiovascular Disease

## 2014-08-21 ENCOUNTER — Encounter: Payer: Self-pay | Admitting: Cardiovascular Disease

## 2014-08-21 VITALS — BP 138/80 | HR 56 | Ht 66.0 in | Wt 346.3 lb

## 2014-08-21 DIAGNOSIS — I739 Peripheral vascular disease, unspecified: Secondary | ICD-10-CM

## 2014-08-21 NOTE — Patient Instructions (Signed)
Dr Berry has requested that you have a lower extremity arterial duplex. This test is an ultrasound of the arteries in the legs. It looks at arterial blood flow in the legs. Allow one hour for Lower Arterial scans. There are no restrictions or special instructions.  Dr Berry recommends that you follow-up with him as needed. 

## 2014-08-21 NOTE — Progress Notes (Signed)
08/21/2014 Tammy [name]   Apr 03, 1955  [id number]  Primary Physician Shirlee Latch, MD Primary Cardiologist: Runell Gess MD Roseanne Reno   HPI:  Tammy [name] is a 59 year old morbidly overweight single African-American female mother of one daughter, grandmother of 3 grandchildren who was recently admitted to [facility] with chest pain and non-STEMI on 07/14/14. Her enzymes were positive. She underwent cardiac catheterization revealing moderate diffuse CAD with normal LV function. Medical therapy was recommended. She did complain of left thigh limiting claudication on the left with Dopplers and revealed a left ABI of 0.32 and she was referred here for further evaluation.   Current Outpatient Prescriptions  Medication Sig Dispense Refill  . amLODipine (NORVASC) 5 MG tablet Take 1 tablet (5 mg total) by mouth daily. 90 tablet 2  . aspirin 81 MG EC tablet Take 1 tablet (81 mg total) by mouth daily. 30 tablet 6  . atorvastatin (LIPITOR) 80 MG tablet Take 1 tablet (80 mg total) by mouth daily at 6 PM. 30 tablet 11  . clopidogrel (PLAVIX) 75 MG tablet Take 1 tablet (75 mg total) by mouth daily. 30 tablet 11  . fluconazole (DIFLUCAN) 150 MG tablet Take 1 tablet (150 mg total) by mouth once. 1 tablet 0  . metoprolol (LOPRESSOR) 100 MG tablet Take 1 tablet (100 mg total) by mouth 2 (two) times daily. 60 tablet 6  . nitroGLYCERIN (NITROSTAT) 0.4 MG SL tablet Place 1 tablet (0.4 mg total) under the tongue every 5 (five) minutes as needed for chest pain. 25 tablet 3  . traMADol (ULTRAM) 50 MG tablet Take 50 mg by mouth every 8 (eight) hours as needed (pain).     No current facility-administered medications for this visit.    Allergies  Allergen Reactions  . Ace Inhibitors     REACTION: rash on breast  . Hydrochlorothiazide     REACTION: "rash on breast and breast pain"    History   Social History  . Marital Status: Single    Spouse Name: N/A  . Number of  Children: N/A  . Years of Education: N/A   Occupational History  . Not on file.   Social History Main Topics  . Smoking status: Current Every Day Smoker -- 0.50 packs/day    Types: Cigarettes  . Smokeless tobacco: Never Used     Comment: does not want to quit  . Alcohol Use: No  . Drug Use: No  . Sexual Activity: Not Currently    Birth Control/ Protection: Post-menopausal, Abstinence   Other Topics Concern  . Not on file   Social History Narrative     Review of Systems: General: negative for chills, fever, night sweats or weight changes.  Cardiovascular: negative for chest pain, dyspnea on exertion, edema, orthopnea, palpitations, paroxysmal nocturnal dyspnea or shortness of breath Dermatological: negative for rash Respiratory: negative for cough or wheezing Urologic: negative for hematuria Abdominal: negative for nausea, vomiting, diarrhea, bright red blood per rectum, melena, or hematemesis Neurologic: negative for visual changes, syncope, or dizziness All other systems reviewed and are otherwise negative except as noted above.    Blood pressure 138/80, pulse 56, height  (1.676 m), weight 346 lb 4.8 oz (157.081 kg).  General appearance: alert and no distress Neck: no adenopathy, no carotid bruit, no JVD, supple, symmetrical, trachea midline and thyroid not enlarged, symmetric, no tenderness/mass/nodules Lungs: clear to auscultation bilaterally Heart: regular rate and rhythm, S1, S2 normal, no murmur, click, rub or gallop  Extremities: extremities normal, atraumatic, no cyanosis or edema  EKG not performed today  ASSESSMENT AND PLAN:   PAD (peripheral artery disease) Tammy [name] is a 59 year old morbidly overweight single African-American female who was referred to me for peripheral vascular evaluation. She has a history of treated hypertension and hyperlipidemia. She recently underwent cardiac catheterization for non-STEMI that revealed moderate diffuse CAD for  which medical therapy was recommended. She also complained of left lower extremity claudication and had ABIs performed in the hospital that were remarkable for a left ABI of 0.32. I'm going to get formal lower extremity arterial Doppler studies and review these to determine whether or not there his pathology does approachable endovascularly.      Runell Gess MD FACP,FACC,FAHA, [facility] 08/21/2014 3:25 PM

## 2014-08-21 NOTE — Assessment & Plan Note (Signed)
Tammy [name] is a 59 year old morbidly overweight single African-American female who was referred to me for peripheral vascular evaluation. She has a history of treated hypertension and hyperlipidemia. She recently underwent cardiac catheterization for non-STEMI that revealed moderate diffuse CAD for which medical therapy was recommended. She also complained of left lower extremity claudication and had ABIs performed in the hospital that were remarkable for a left ABI of 0.32. I'm going to get formal lower extremity arterial Doppler studies and review these to determine whether or not there his pathology does approachable endovascularly.

## 2014-08-22 ENCOUNTER — Encounter (HOSPITAL_COMMUNITY): Payer: Self-pay | Admitting: *Deleted

## 2014-08-27 ENCOUNTER — Telehealth: Payer: Self-pay | Admitting: *Deleted

## 2014-08-27 NOTE — Telephone Encounter (Signed)
Spoke to patient Tammy [name] states Tammy [name] had question concerning the up coming test - LEA DOPPLER, possible p.v.angiogram Tammy [name] states Tammy [name] will have doppler but Tammy [name] states Tammy [name] would like for another doctor to proceed with any procedures and post follow up if needed. RN explained to patient what to expect with doppler. Depending on the results will depend on the next step. Patient voiced understanding and thanked Charity fundraiserN.

## 2014-08-27 NOTE — Telephone Encounter (Signed)
Called left message for patient to call back Patient spoke to Engineer, manufacturingractice manager earlier today. Patient had some question concerning her previous office visit with Dr Allyson SabalBerry.

## 2014-08-30 ENCOUNTER — Other Ambulatory Visit: Payer: Self-pay | Admitting: Cardiovascular Disease

## 2014-08-30 DIAGNOSIS — I739 Peripheral vascular disease, unspecified: Secondary | ICD-10-CM

## 2014-09-04 ENCOUNTER — Other Ambulatory Visit: Payer: Self-pay | Admitting: Cardiovascular Disease

## 2014-09-04 ENCOUNTER — Ambulatory Visit (HOSPITAL_COMMUNITY)
Admission: RE | Admit: 2014-09-04 | Discharge: 2014-09-04 | Disposition: A | Discharge status: Home / Self Care | Payer: Medicaid Other | Source: Ambulatory Visit | Attending: Cardiovascular Disease | Admitting: Cardiovascular Disease

## 2014-09-04 ENCOUNTER — Ambulatory Visit (HOSPITAL_BASED_OUTPATIENT_CLINIC_OR_DEPARTMENT_OTHER)
Admission: RE | Admit: 2014-09-04 | Discharge: 2014-09-04 | Disposition: A | Discharge status: Home / Self Care | Payer: Medicaid Other | Source: Ambulatory Visit | Attending: Cardiovascular Disease | Admitting: Cardiovascular Disease

## 2014-09-04 DIAGNOSIS — I739 Peripheral vascular disease, unspecified: Secondary | ICD-10-CM | POA: Insufficient documentation

## 2014-09-04 DIAGNOSIS — I2510 Atherosclerotic heart disease of native coronary artery without angina pectoris: Secondary | ICD-10-CM | POA: Diagnosis not present

## 2014-09-04 DIAGNOSIS — E785 Hyperlipidemia, unspecified: Secondary | ICD-10-CM | POA: Insufficient documentation

## 2014-09-04 DIAGNOSIS — I10 Essential (primary) hypertension: Secondary | ICD-10-CM | POA: Diagnosis not present

## 2014-09-04 DIAGNOSIS — R200 Anesthesia of skin: Secondary | ICD-10-CM | POA: Insufficient documentation

## 2014-09-04 DIAGNOSIS — R202 Paresthesia of skin: Secondary | ICD-10-CM | POA: Diagnosis not present

## 2014-09-13 ENCOUNTER — Ambulatory Visit: Payer: Medicaid Other | Admitting: Family Medicine

## 2014-09-14 ENCOUNTER — Encounter: Payer: Self-pay | Admitting: Family Medicine

## 2014-09-14 ENCOUNTER — Ambulatory Visit (INDEPENDENT_AMBULATORY_CARE_PROVIDER_SITE_OTHER): Payer: Medicaid Other | Admitting: Family Medicine

## 2014-09-14 VITALS — BP 166/76 | HR 54 | Temp 98.5°F | Ht 66.0 in | Wt 345.0 lb

## 2014-09-14 DIAGNOSIS — I10 Essential (primary) hypertension: Secondary | ICD-10-CM | POA: Diagnosis not present

## 2014-09-14 DIAGNOSIS — Z720 Tobacco use: Secondary | ICD-10-CM | POA: Diagnosis not present

## 2014-09-14 MED ORDER — AMLODIPINE BESYLATE 10 MG PO TABS: 10.0000 mg | ORAL_TABLET | Freq: Every day | ORAL | Status: DC

## 2014-09-14 NOTE — Assessment & Plan Note (Signed)
Encourage patient to continue to see Dr. Gerilyn PilgrimSykes for nutrition counseling Patient is hesitant and resistant to change Encouraged ongoing efforts at weight loss

## 2014-09-14 NOTE — Assessment & Plan Note (Signed)
Again encourage patient about tobacco cessation Patient currently unwilling to attempt quitting smoking while she is trying to lose weight Will continue to counsel about  tobacco cessation

## 2014-09-14 NOTE — Assessment & Plan Note (Signed)
Currently above goal especially in setting of recent NSTEMI Increase amlodipine dose to 10 mg daily Continue metoprolol 100 mg twice a day Follow-up in one month

## 2014-09-14 NOTE — Progress Notes (Signed)
   Subjective:   Tammy [name] is a 59 y.o. female with a history of HTN, NSTEMI in 5/16, PAD, CAD, HLD, tobacco dependence, morbid obesity here for hypertension follow-up.  HTN: - taking norvasc 5mg  daily, metoprolol 100mg  BID - denies any medication SEs - no CP, SOB, LE edema, HAs, vision changes - doesn't take BP at home - No symptoms of hypotension  Morbid obesity: - Saw Dr Gerilyn PilgrimSykes last month - Wants referral to diabetes and nutrition center - Wants more visuals to learn about portion size and a list of what to eat - feels like needs more concrete ideas so that when she goes to the store she knows how much of what to get - wants to try a detox drink called TLC  Review of Systems:  Per HPI. All other systems reviewed and are negative.   PMH, PSH, Medications, Allergies, and FmHx reviewed and updated in EMR.  Social History: current smoker - unwilling to attempt quitting smoking until starts to lose weight  Objective:  BP 166/76 mmHg  Pulse 54  Temp(Src) 98.5 F (36.9 C) (Oral)  Ht 5\' 6"  (1.676 m)  Wt 345 lb (156.491 kg)  BMI 55.71 kg/m2  Gen:  59 y.o. female in NAD HEENT: NCAT, MMM, EOMI, PERRL, anicteric sclerae CV: RRR, no MRG, no JVD Resp: Non-labored, CTAB, no wheezes noted Abd: Soft, NTND, BS present Ext: WWP, no edema MSK: Full ROM, strength intact Neuro: Alert and oriented, speech normal      Chemistry      Component Value Date/Time   NA 137 07/17/2014 0247   K 4.4 07/17/2014 0247   CL 106 07/17/2014 0247   CO2 22 07/17/2014 0247   BUN 15 07/17/2014 0247   CREATININE 0.91 07/17/2014 0247   CREATININE 1.05 11/14/2013 1204      Component Value Date/Time   CALCIUM 9.3 07/17/2014 0247   ALKPHOS 86 11/02/2008 2007   AST 14 11/02/2008 2007   ALT 9 11/02/2008 2007   BILITOT 0.5 11/02/2008 2007      Lab Results  Component Value Date   WBC 3.9* 07/17/2014   HGB 11.6* 07/17/2014   HCT 35.3* 07/17/2014   MCV 82.9 07/17/2014   PLT 176 07/17/2014    Lab Results  Component Value Date   TSH 3.481 07/15/2014   Lab Results  Component Value Date   HGBA1C 5.6 07/15/2014   Assessment:     Tammy [name] is a 59 y.o. female here for HTN, morbid obesity, tobacco abuse follow-up    Plan:     See problem list for problem-specific plans.   Erasmo DownerAngela M Bernise Sylvain, MD PGY-2,  [facility] 09/14/2014  3:40 PM

## 2014-09-14 NOTE — Patient Instructions (Signed)
Nice to see you again today. Your blood pressure is high today. We will increase your amlodipine dose from 5 mg to 10 mg once daily. You can take 2 of your 5 mg tablets at the same time to make 10 mg until you run out. Then you'll pick up the prescription for the 10 mg tablets. Come back to see me in one month to see how that medication is doing for your blood pressure.  Take care, Dr. BLeonard Schwartz

## 2014-09-21 NOTE — Telephone Encounter (Signed)
Dr Allyson Sabal reviewed her dopplers and wants to see her back in the office.  I called patient to review that with her.  She verbalized understanding, but would not schedule an appt.  She said that she has things to work out on her end and she was not ready to schedule.  I explained to patient that Dr Allyson Sabal wants to see her soon.  She raised her voice and repeated that she had things to work out on her end and hung up the phone.

## 2014-10-04 IMAGING — MG MM DIGITAL SCREENING BILAT
8 of 9 series · 8 of 9 positions shown · non-contrast
Comparison: Previous exam(s)

ACR Breast Density Category a: The breast tissue is almost entirely
fatty.

CLINICAL DATA: Screening.

EXAM:
DIGITAL SCREENING BILATERAL MAMMOGRAM WITH CAD

[R CC (1 of 2)]
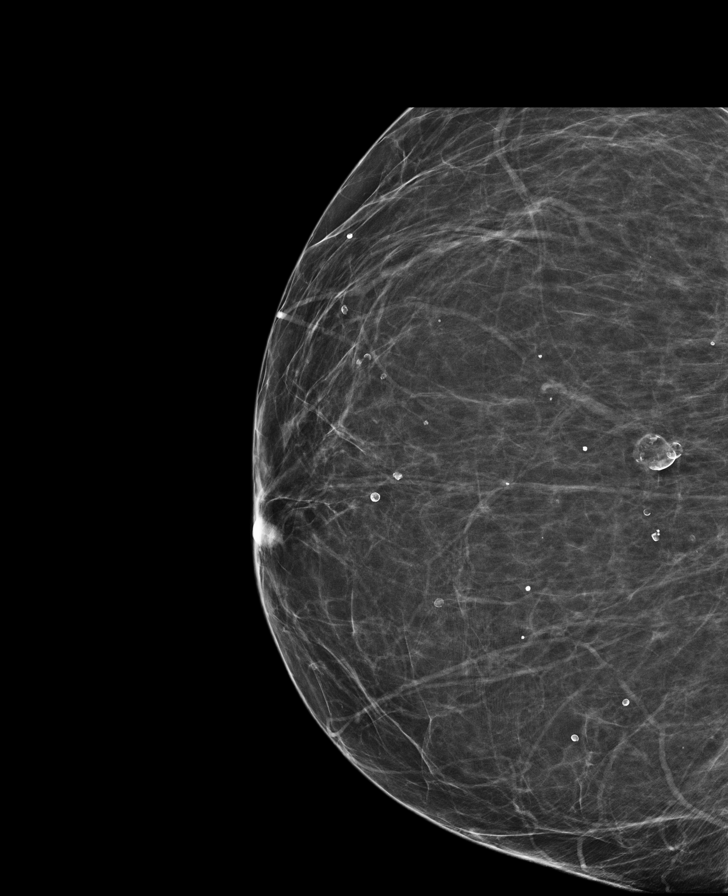

[L CC (1 of 2)]
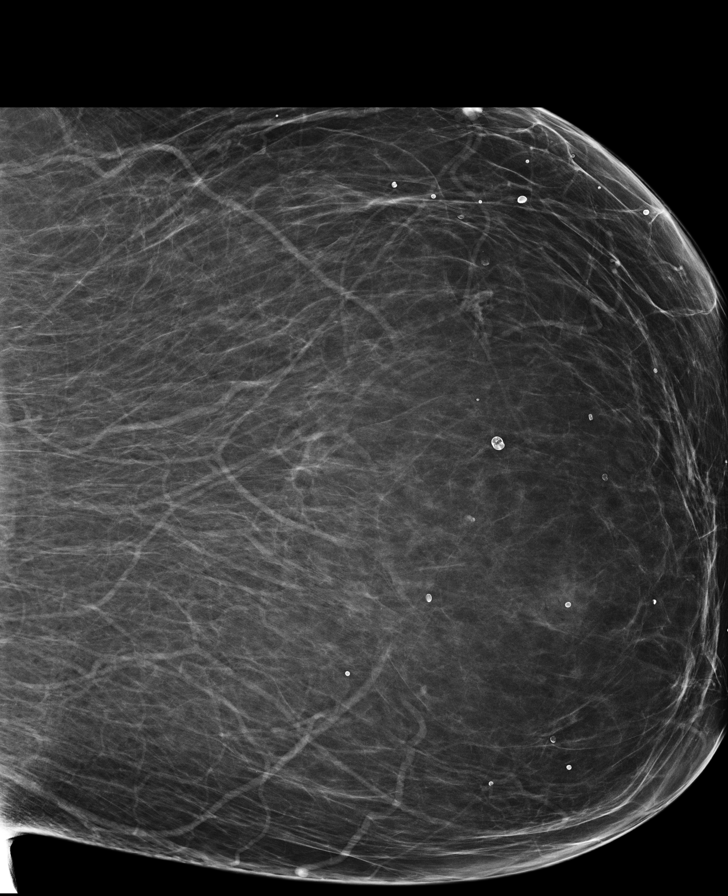

[L CC (2 of 2)]
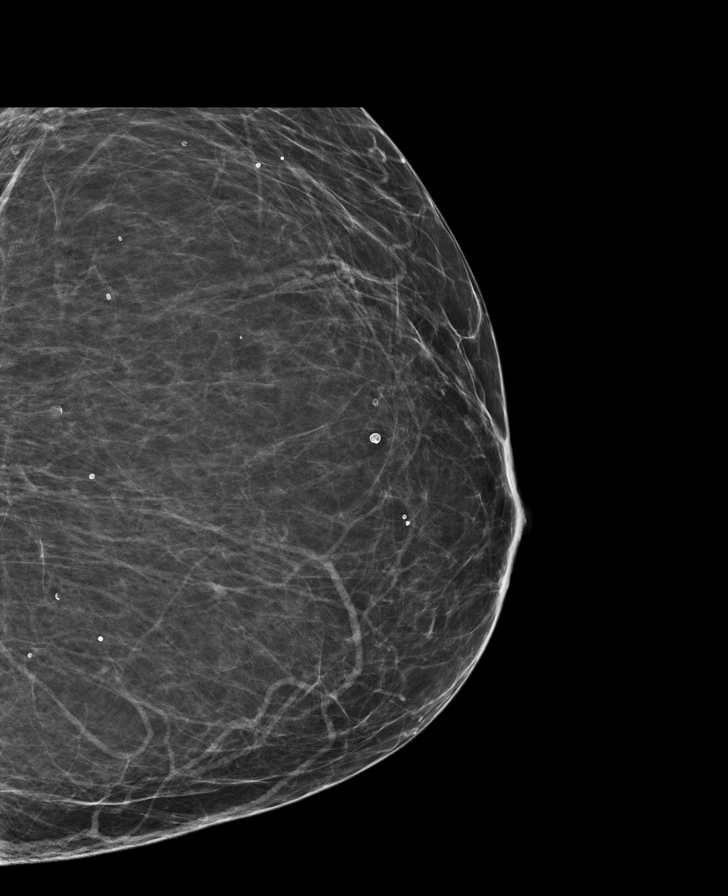

[R CC (2 of 2)]
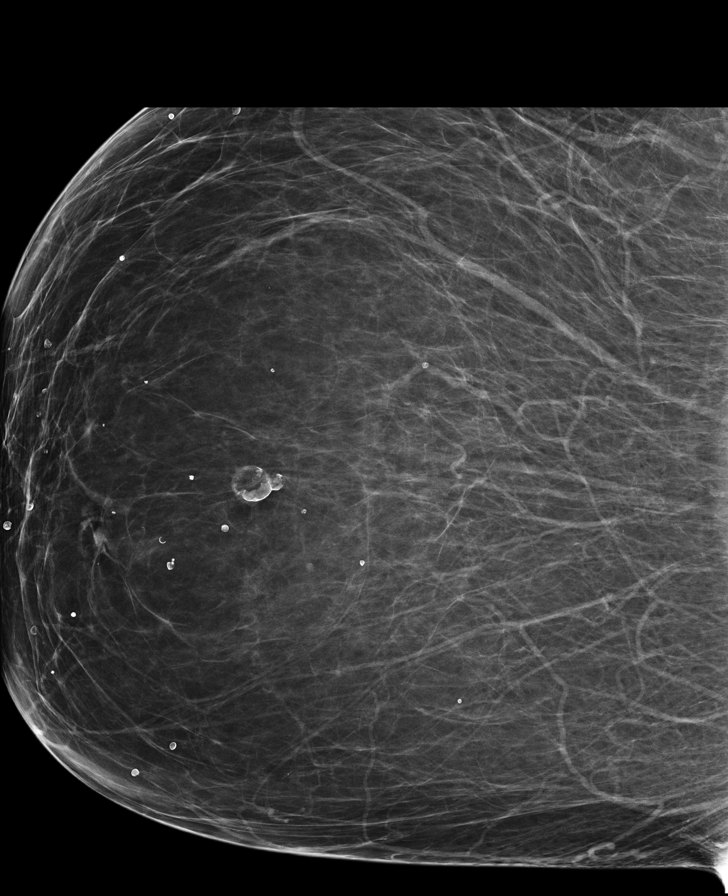

[R MLO (1 of 3)]
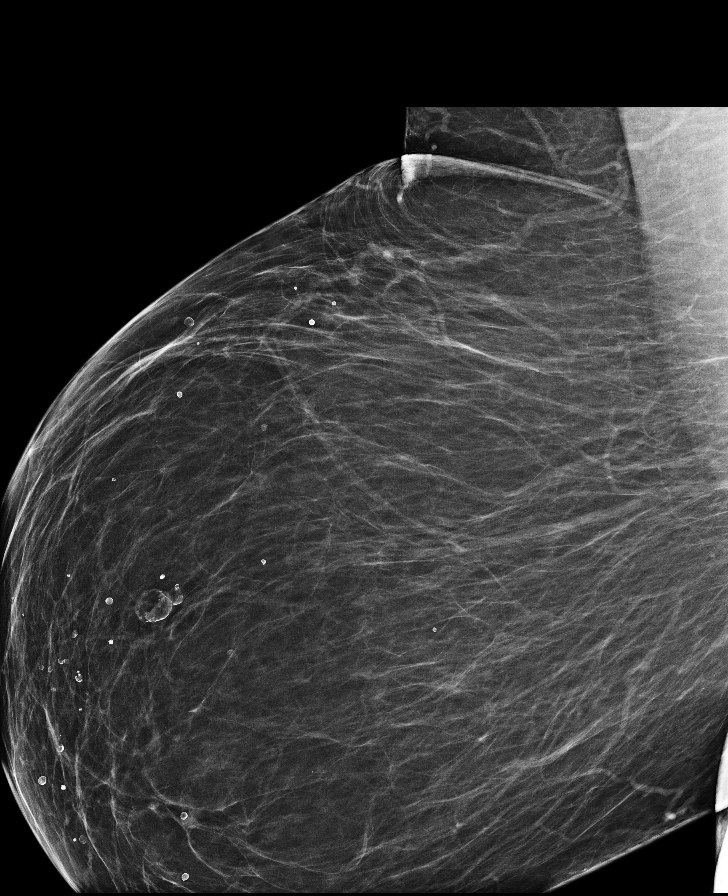

[R MLO (2 of 3)]
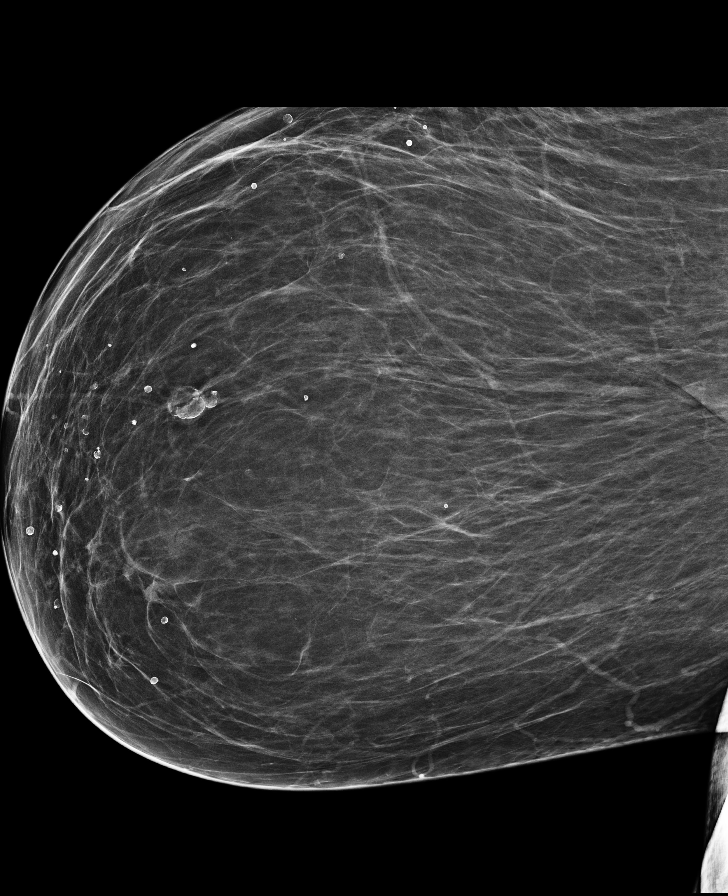

[R MLO (3 of 3)]
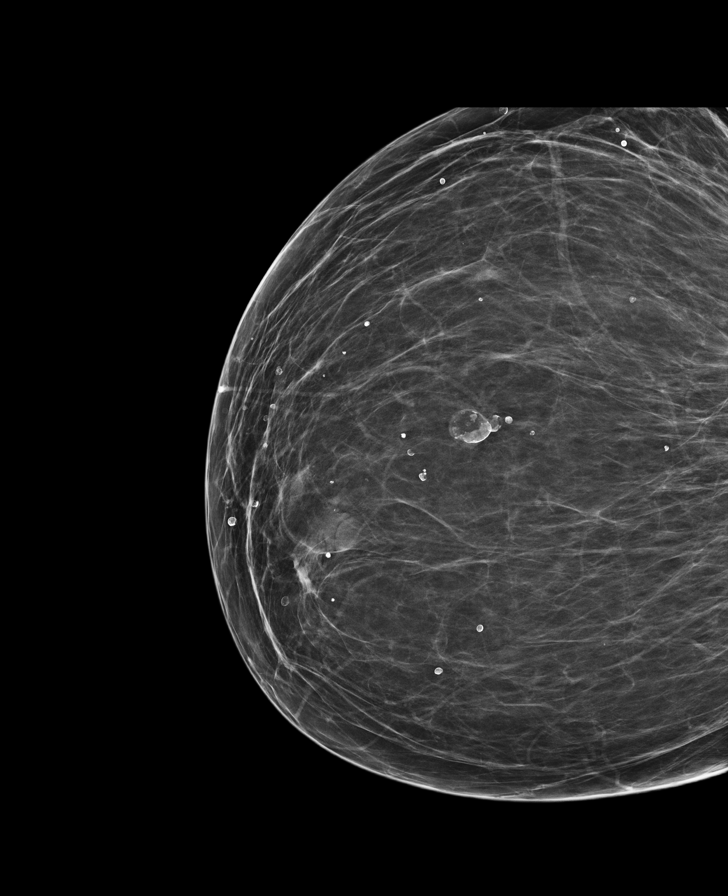

[L MLO]
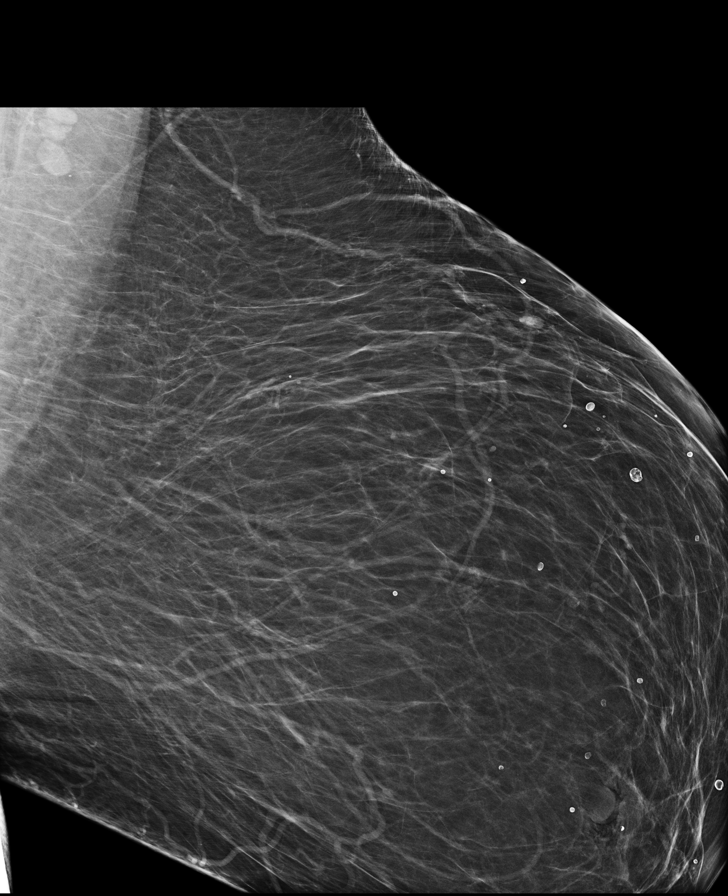

[8 of 9 positions shown; findings below may reference images not displayed]

FINDINGS: There are no findings suspicious for malignancy. Images were
processed with CAD.
IMPRESSION: No mammographic evidence of malignancy. A result letter of this
screening mammogram will be mailed directly to the patient.

RECOMMENDATION:
Screening mammogram in one year. (Code:JI-[id number])

BI-RADS CATEGORY  1: Negative

## 2014-10-24 ENCOUNTER — Ambulatory Visit (INDEPENDENT_AMBULATORY_CARE_PROVIDER_SITE_OTHER): Payer: Medicaid Other | Admitting: Family Medicine

## 2014-10-24 ENCOUNTER — Encounter: Payer: Self-pay | Admitting: Family Medicine

## 2014-10-24 VITALS — BP 133/64 | HR 55 | Temp 97.7°F | Ht 66.0 in | Wt 347.0 lb

## 2014-10-24 DIAGNOSIS — Z720 Tobacco use: Secondary | ICD-10-CM | POA: Diagnosis not present

## 2014-10-24 DIAGNOSIS — F17200 Nicotine dependence, unspecified, uncomplicated: Secondary | ICD-10-CM

## 2014-10-24 DIAGNOSIS — I10 Essential (primary) hypertension: Secondary | ICD-10-CM | POA: Diagnosis present

## 2014-10-24 DIAGNOSIS — I739 Peripheral vascular disease, unspecified: Secondary | ICD-10-CM | POA: Diagnosis not present

## 2014-10-25 NOTE — Progress Notes (Signed)
   Subjective:   Tammy [name] is a 59 y.o. female with a history of HTN, and STEMI, PAD, CAD, HLD, tobacco abuse, morbid obesity here for HTN, obesity, PAD.  HTN: - Increase amlodipine dose at last visit - Patient tolerating well - Reports good compliance of medications - Denies chest pain, shortness breath, headaches, vision changes, symptoms of hypotension - Reports she has been very stressed by taking care of her grandkids recently  Obesity: - Reports she does not want to go back to see a dietitian  - she is very interested in losing weight but reports that she "can't lose a pound" - she reports she is started as red and is not frying food or eating fast food anymore - She is walking about 6 blocks twice a week as her form of exercise - Reports that exercises difficult secondary to PAD  Tobacco abuse - Reports she is still not interested in quitting smoking as she is trying to lose weight first  PAD - Continues to have leg pain with exercise - Was seen by vascular surgery but would like a second opinion because she did not like her conversation with the first surgeon that she saw  Review of Systems:  Per HPI. All other systems reviewed and are negative.   PMH, PSH, Medications, Allergies, and FmHx reviewed and updated in EMR.  Social History:Current smoker  Objective:  BP 133/64 mmHg  Pulse 55  Temp(Src) 97.7 F (36.5 C) (Oral)  Ht  (1.676 m)  Wt 347 lb (157.398 kg)  BMI 56.03 kg/m2  Gen:  59 y.o. female in NAD HEENT: NCAT, MMM, EOMI, PERRL, anicteric sclerae CV: RRR, no MRG Resp: Non-labored, CTAB, no wheezes noted Abd: Soft, NTND, BS present, no guarding or organomegaly Ext: WWP, no edema MSK: Full ROM, strength intact Neuro: Alert and oriented, speech normal      Chemistry      Component Value Date/Time   NA 137 07/17/2014 0247   K 4.4 07/17/2014 0247   CL 106 07/17/2014 0247   CO2 22 07/17/2014 0247   BUN 15 07/17/2014 0247   CREATININE 0.91  07/17/2014 0247   CREATININE 1.05 11/14/2013 1204      Component Value Date/Time   CALCIUM 9.3 07/17/2014 0247   ALKPHOS 86 11/02/2008 2007   AST 14 11/02/2008 2007   ALT 9 11/02/2008 2007   BILITOT 0.5 11/02/2008 2007      Lab Results  Component Value Date   WBC 3.9* 07/17/2014   HGB 11.6* 07/17/2014   HCT 35.3* 07/17/2014   MCV 82.9 07/17/2014   PLT 176 07/17/2014   Lab Results  Component Value Date   TSH 3.481 07/15/2014   Lab Results  Component Value Date   HGBA1C 5.6 07/15/2014   Assessment:     Tammy [name] is a 59 y.o. female here for PAD, Obesity, tobacco abuse, HTN    Plan:     See problem list for problem-specific plans.   Erasmo Downer, MD PGY-2,  [facility] 10/25/2014  12:57 PM

## 2014-10-25 NOTE — Assessment & Plan Note (Signed)
Stressed importance of following vascular surgery recommendations We'll refer to vascular surgery for a second opinion per patient request

## 2014-10-25 NOTE — Assessment & Plan Note (Signed)
Again encourage patient to consider quitting smoking

## 2014-10-25 NOTE — Assessment & Plan Note (Signed)
Well controlled in clinic today Continue current medication regimen Follow-up in 3 months

## 2014-10-25 NOTE — Assessment & Plan Note (Signed)
Again encourage patient to continue to see dietitian Diet and exercise counseling given Continue to follow

## 2014-10-25 NOTE — Patient Instructions (Signed)
Continue to take current meds.  Take care, Dr. Leonard Schwartz

## 2014-11-07 ENCOUNTER — Encounter: Payer: Medicaid Other | Admitting: Vascular Surgery

## 2014-11-12 ENCOUNTER — Other Ambulatory Visit: Payer: Self-pay | Admitting: Family Medicine

## 2014-11-21 ENCOUNTER — Encounter: Payer: Self-pay | Admitting: Surgery

## 2014-11-26 ENCOUNTER — Encounter: Payer: Self-pay | Admitting: Surgery

## 2014-11-26 ENCOUNTER — Ambulatory Visit (INDEPENDENT_AMBULATORY_CARE_PROVIDER_SITE_OTHER): Payer: Medicaid Other | Admitting: Surgery

## 2014-11-26 VITALS — BP 120/58 | HR 56 | Ht 66.0 in | Wt 348.0 lb

## 2014-11-26 DIAGNOSIS — I739 Peripheral vascular disease, unspecified: Secondary | ICD-10-CM

## 2014-11-26 MED ORDER — CILOSTAZOL 100 MG PO TABS: 100.0000 mg | ORAL_TABLET | Freq: Two times a day (BID) | ORAL | Status: DC

## 2014-11-26 NOTE — Progress Notes (Signed)
Patient name: Tammy [name] MRN: [id number] DOB: 27-Mar-1955 Sex: female   Referred by: Dr. Beryle Flock  Reason for referral:  Chief Complaint  Patient presents with  . New Evaluation    eval PAD    HISTORY OF PRESENT ILLNESS:  this is a 59 year old female who is referred today for evaluation of peripheral vascular disease. The patient reports having pain in her legs with walking, particularly on the left side. She can walk for a few minutes or one half block before she gets left calf cramping.  She states that her right leg is beginning to bother her. She denies rest pain or nonhealing wounds.  Patient suffered a myocardial infarction over the summer.  She underwent cardiac catheterization.  Medical therapy was recommended.  The patient currently takes a statin for hypercholesterolemia.  She is on multiple medications for hypertension.  She continues to smoke half a pack a day but is trying to cut back.  She does suffer from morbid obesity and would like to lose weight but is limited by her activity.  Past Medical History  Diagnosis Date  . Ulcer   . GERD (gastroesophageal reflux disease)   . Essential hypertension   . Abnormal mammogram 10.25.11    area of density with several adjacent amorphous calcifications located laterally w/in the right breast at approx 9-10 o'clock position likely represents area of evolving far necrosis; f/u mamo and U/S in 6 months  . Morbid obesity   . CAD (coronary artery disease)     a. 07/2014 NSTEMI/Cath: LM nl, LAD 30ost/m, 60d, LCX 20p, OM1 20, OM2 40, OM3 50, RCA 30p, 44m, 20d, RPDA 40, EF 65%-->Med Rx;  b. 07/2014 Echo: EF 55-60%, no rwma, mildly dil LA.  Marland Kitchen PAD (peripheral artery disease)     a. 07/2014 ABI's: R 0.88, L 0.32.  Marland Kitchen Hyperlipidemia     Past Surgical History  Procedure Laterality Date  . Cesarean section    . Upper gastrointestinal endoscopy  1991  . Trigger finger release Left     middle finger  . Cardiac catheterization N/A  07/16/2014    Procedure: Left Heart Cath and Coronary Angiography;  Surgeon: Kathleene Hazel, MD;  Location: [facility] INVASIVE CV LAB;  Service: Cardiovascular;  Laterality: N/A;    Social History   Social History  . Marital Status: Single    Spouse Name: N/A  . Number of Children: N/A  . Years of Education: N/A   Occupational History  . Not on file.   Social History Main Topics  . Smoking status: Current Every Day Smoker -- 1.00 packs/day for 30 years    Types: Cigarettes  . Smokeless tobacco: Never Used     Comment: does not want to quit  . Alcohol Use: No  . Drug Use: No  . Sexual Activity: Not Currently    Birth Control/ Protection: Post-menopausal, Abstinence   Other Topics Concern  . Not on file   Social History Narrative    Family History  Problem Relation Age of Onset  . Cancer Mother     Bladder cancer, cause of death  . Diabetes Mother   . Diabetes Father   . Hypertension Father   . Hypertension Sister   . Hypertension Brother   . Cancer Maternal Aunt   . Breast cancer Maternal Aunt   . Ovarian cancer Maternal Aunt     Allergies as of 11/26/2014 - Review Complete 11/26/2014  Allergen Reaction Noted  . Ace inhibitors  10/14/2007  . Hydrochlorothiazide  02/28/2010    Current Outpatient Prescriptions on File Prior to Visit  Medication Sig Dispense Refill  . amLODipine (NORVASC) 10 MG tablet TAKE 1 TABLET (10 MG TOTAL) BY MOUTH DAILY. 30 tablet 3  . aspirin 81 MG EC tablet Take 1 tablet (81 mg total) by mouth daily. 30 tablet 6  . atorvastatin (LIPITOR) 80 MG tablet Take 1 tablet (80 mg total) by mouth daily at 6 PM. 30 tablet 11  . clopidogrel (PLAVIX) 75 MG tablet Take 1 tablet (75 mg total) by mouth daily. 30 tablet 11  . fluconazole (DIFLUCAN) 150 MG tablet Take 1 tablet (150 mg total) by mouth once. 1 tablet 0  . metoprolol (LOPRESSOR) 100 MG tablet Take 1 tablet (100 mg total) by mouth 2 (two) times daily. 60 tablet 6  . nitroGLYCERIN (NITROSTAT)  0.4 MG SL tablet Place 1 tablet (0.4 mg total) under the tongue every 5 (five) minutes as needed for chest pain. 25 tablet 3  . traMADol (ULTRAM) 50 MG tablet Take 50 mg by mouth every 8 (eight) hours as needed (pain).     No current facility-administered medications on file prior to visit.     REVIEW OF SYSTEMS: Cardiovascular: No chest pain, chest pressure, palpitations, orthopnea, or dyspnea on exertion.  Positive for left leg pain with walking  No history of DVT or phlebitis. Pulmonary: No productive cough, asthma or wheezing. Neurologic: No weakness, paresthesias, aphasia, or amaurosis. No dizziness. Hematologic: No bleeding problems or clotting disorders. Musculoskeletal: No joint pain or joint swelling. Gastrointestinal: No blood in stool or hematemesis Genitourinary: No dysuria or hematuria. Psychiatric:: No history of major depression. Integumentary: No rashes or ulcers. Constitutional: No fever or chills.  PHYSICAL EXAMINATION:  Filed Vitals:   11/26/14 1133 11/26/14 1139  BP: 160/74 120/58  Pulse: 56   Height:  (1.676 m)   Weight: 348 lb (157.852 kg)   SpO2: 91%    Body mass index is 56.2 kg/(m^2). General: The patient appears their stated age.   HEENT:  No gross abnormalities Pulmonary: Respirations are non-labored Abdomen: Soft and non-tender  Musculoskeletal: There are no major deformities.   Neurologic: No focal weakness or paresthesias are detected, Skin: There are no ulcer or rashes noted. Psychiatric: The patient has normal affect. Cardiovascular: There is a regular rate and rhythm without significant murmur appreciated. Palpable right percent was pedis pulse  Diagnostic Studies:  I have reviewed her outside duplex findings.  They suggest no aortoiliac stenosis.  There is no significant runoff or outflow disease on the right.  On the left, superficial femoral artery occlusion is noted.    Assessment:   atherosclerosis with claudication Plan:  I  discussed with the patient, the treatment options which include percutaneous intervention, surgical revascularization, or medical therapy.  Before proceeding with angiography, I stressed the importance of trying to quit smoking as well as a trial of cilostazol to see if this helps improve her walking distance , so as to delay any intervention. If she does not get benefit from a trial of cilostazol, I would recommend proceeding with angiography.  This of the done via a right femoral approach with attempts to recanalize her occluded left superficial femoral artery. After a month if she has not had benefit from cilostazol, I told her to contact our office and schedule an angiogram.  If she has had some benefit, I will follow-up with her in 2 months.  We also discussed different modalities for exercise  and the benefits of an exercise program regarding claudication. I suggested a stationary bike, as she would also like to try a treadmill.  We also discussed aquatic activities.     Jorge Ny, M.D. [facility] Office: [phone] Pager:  [phone]

## 2014-12-31 ENCOUNTER — Other Ambulatory Visit: Payer: Self-pay | Admitting: *Deleted

## 2014-12-31 DIAGNOSIS — I10 Essential (primary) hypertension: Secondary | ICD-10-CM

## 2014-12-31 MED ORDER — METOPROLOL TARTRATE 100 MG PO TABS: 100.0000 mg | ORAL_TABLET | Freq: Two times a day (BID) | ORAL | Status: DC

## 2015-01-22 ENCOUNTER — Encounter: Payer: Self-pay | Admitting: Surgery

## 2015-01-28 ENCOUNTER — Encounter: Payer: Self-pay | Admitting: Surgery

## 2015-01-28 ENCOUNTER — Ambulatory Visit (INDEPENDENT_AMBULATORY_CARE_PROVIDER_SITE_OTHER): Payer: Medicaid Other | Admitting: Surgery

## 2015-01-28 VITALS — BP 137/69 | HR 63 | Ht 66.0 in | Wt 351.3 lb

## 2015-01-28 DIAGNOSIS — I739 Peripheral vascular disease, unspecified: Secondary | ICD-10-CM

## 2015-01-28 NOTE — Progress Notes (Signed)
Patient name: Tammy [name] MRN: [id number] DOB: [birth year] Sex: female     Chief Complaint  Patient presents with  . Re-evaluation    2 month f/u - not able to take RX cilostazol due to severe headaches    HISTORY OF PRESENT ILLNESS:  the patient is back for follow-up of her claudication. When I first saw her, I elected to treat her medically initially. Biggest complaint is in her left leg.  She is only walking approximately 20 feet now before she gets pain. She tried ilostazol, however developed severe headaches and had to discontinue it.  Patient suffered a myocardial infarction over the summer. She underwent cardiac catheterization. Medical therapy was recommended. The patient currently takes a statin for hypercholesterolemia. She is on multiple medications for hypertension. She continues to smoke half a pack a day but is trying to cut back. She does suffer from morbid obesity and would like to lose weight but is limited by her activity.  Past Medical History  Diagnosis Date  . Ulcer   . GERD (gastroesophageal reflux disease)   . Essential hypertension   . Abnormal mammogram 10.25.11    area of density with several adjacent amorphous calcifications located laterally w/in the right breast at approx 9-10 o'clock position likely represents area of evolving far necrosis; f/u mamo and U/S in 6 months  . Morbid obesity (HCC)   . CAD (coronary artery disease)     a. 07/2014 NSTEMI/Cath: LM nl, LAD 30ost/m, 60d, LCX 20p, OM1 20, OM2 40, OM3 50, RCA 30p, 20m, 20d, RPDA 40, EF 65%-->Med Rx;  b. 07/2014 Echo: EF 55-60%, no rwma, mildly dil LA.  Marland Kitchen PAD (peripheral artery disease) (HCC)     a. 07/2014 ABI's: R 0.88, L 0.32.  Marland Kitchen Hyperlipidemia     Past Surgical History  Procedure Laterality Date  . Cesarean section    . Upper gastrointestinal endoscopy  1991  . Trigger finger release Left     middle finger  . Cardiac catheterization N/A 07/16/2014    Procedure: Left Heart Cath and  Coronary Angiography;  Surgeon: Kathleene Hazel, MD;  Location: [facility] INVASIVE CV LAB;  Service: Cardiovascular;  Laterality: N/A;    Social History   Social History  . Marital Status: Single    Spouse Name: N/A  . Number of Children: N/A  . Years of Education: N/A   Occupational History  . Not on file.   Social History Main Topics  . Smoking status: Current Every Day Smoker -- 1.00 packs/day for 30 years    Types: Cigarettes  . Smokeless tobacco: Never Used     Comment: does not want to quit  . Alcohol Use: No  . Drug Use: No  . Sexual Activity: Not Currently    Birth Control/ Protection: Post-menopausal, Abstinence   Other Topics Concern  . Not on file   Social History Narrative    Family History  Problem Relation Age of Onset  . Cancer Mother     Bladder cancer, cause of death  . Diabetes Mother   . Diabetes Father   . Hypertension Father   . Hypertension Sister   . Hypertension Brother   . Cancer Maternal Aunt   . Breast cancer Maternal Aunt   . Ovarian cancer Maternal Aunt     Allergies as of 01/28/2015 - Review Complete 01/28/2015  Allergen Reaction Noted  . Ace inhibitors  10/14/2007  . Hydrochlorothiazide  02/28/2010    Current Outpatient Prescriptions  on File Prior to Visit  Medication Sig Dispense Refill  . amLODipine (NORVASC) 10 MG tablet TAKE 1 TABLET (10 MG TOTAL) BY MOUTH DAILY. 30 tablet 3  . aspirin 81 MG EC tablet Take 1 tablet (81 mg total) by mouth daily. 30 tablet 6  . atorvastatin (LIPITOR) 80 MG tablet Take 1 tablet (80 mg total) by mouth daily at 6 PM. 30 tablet 11  . clopidogrel (PLAVIX) 75 MG tablet Take 1 tablet (75 mg total) by mouth daily. 30 tablet 11  . fluconazole (DIFLUCAN) 150 MG tablet Take 1 tablet (150 mg total) by mouth once. 1 tablet 0  . metoprolol (LOPRESSOR) 100 MG tablet Take 1 tablet (100 mg total) by mouth 2 (two) times daily. 60 tablet 6  . nitroGLYCERIN (NITROSTAT) 0.4 MG SL tablet Place 1 tablet (0.4 mg  total) under the tongue every 5 (five) minutes as needed for chest pain. 25 tablet 3  . traMADol (ULTRAM) 50 MG tablet Take 50 mg by mouth every 8 (eight) hours as needed (pain).    . cilostazol (PLETAL) 100 MG tablet Take 1 tablet (100 mg total) by mouth 2 (two) times daily before a meal. (Patient not taking: Reported on 01/28/2015) 60 tablet 11   No current facility-administered medications on file prior to visit.     REVIEW OF SYSTEMS:  see history of present illness, otherwise no changes from prior visit  PHYSICAL EXAMINATION:   Vital signs are  Filed Vitals:   01/28/15 0857  BP: 137/69  Pulse: 63  Height: 5\' 6"  (1.676 m)  Weight: 351 lb 4.8 oz (159.349 kg)  SpO2: 97%   Body mass index is 56.73 kg/(m^2). General: The patient appears their stated age. HEENT:  No gross abnormalities Pulmonary:  Non labored breathing Musculoskeletal: There are no major deformities. Neurologic: No focal weakness or paresthesias are detected, Skin: There are no ulcer or rashes noted. Psychiatric: The patient has normal affect. Cardiovascular: There is a regular rate and rhythm without significant murmur appreciated.   Diagnostic Studies  none  Assessment:   Bilateral claudication, left greater than right Plan:  I again stressed the importance of medical therapy.  Unfortunately, the patient could not tolerate cilostazol secondary to headaches.  Therefore I have elected to proceed with angiography.  The patient is going to discuss this further with her daughter. When we proceed, I will plan on cannulation of the right groin with aortogram, bilateral runoff, and intervention on the left leg if possible.  We discussed the risks and benefits including the risk of bleeding, the risk of not being able to cross her occlusion, distal embolization. She is going to contact us with a procedure date.  Jorge NyV. Wells Brabham IV, M.D. [facility] [place] Office: [phone] Pager:   [phone]

## 2015-01-30 ENCOUNTER — Telehealth: Payer: Self-pay | Admitting: Family Medicine

## 2015-01-30 DIAGNOSIS — I10 Essential (primary) hypertension: Secondary | ICD-10-CM

## 2015-01-30 NOTE — Telephone Encounter (Signed)
Needs refills on metroprol, [facility]ipine [facility] [place]

## 2015-01-31 ENCOUNTER — Other Ambulatory Visit: Payer: Self-pay

## 2015-01-31 MED ORDER — METOPROLOL TARTRATE 100 MG PO TABS: 100.0000 mg | ORAL_TABLET | Freq: Two times a day (BID) | ORAL | Status: DC

## 2015-01-31 MED ORDER — AMLODIPINE BESYLATE 10 MG PO TABS: ORAL_TABLET | ORAL | Status: DC

## 2015-01-31 NOTE — Telephone Encounter (Signed)
Refills sent  Erasmo DownerAngela M Bacigalupo, MD, MPH PGY-2,  [facility] 01/31/2015 10:22 AM

## 2015-02-12 ENCOUNTER — Encounter (HOSPITAL_COMMUNITY)
Admission: RE | Disposition: A | Discharge status: Home / Self Care | Payer: Self-pay | Source: Ambulatory Visit | Attending: Surgery

## 2015-02-12 ENCOUNTER — Ambulatory Visit (HOSPITAL_COMMUNITY)
Admission: RE | Admit: 2015-02-12 | Discharge: 2015-02-12 | Disposition: A | Discharge status: Home / Self Care | Payer: Medicaid Other | Source: Ambulatory Visit | Attending: Surgery | Admitting: Surgery

## 2015-02-12 DIAGNOSIS — E7800 Pure hypercholesterolemia, unspecified: Secondary | ICD-10-CM | POA: Insufficient documentation

## 2015-02-12 DIAGNOSIS — F17210 Nicotine dependence, cigarettes, uncomplicated: Secondary | ICD-10-CM | POA: Insufficient documentation

## 2015-02-12 DIAGNOSIS — Z8249 Family history of ischemic heart disease and other diseases of the circulatory system: Secondary | ICD-10-CM | POA: Insufficient documentation

## 2015-02-12 DIAGNOSIS — Z7902 Long term (current) use of antithrombotics/antiplatelets: Secondary | ICD-10-CM | POA: Diagnosis not present

## 2015-02-12 DIAGNOSIS — Z7982 Long term (current) use of aspirin: Secondary | ICD-10-CM | POA: Diagnosis not present

## 2015-02-12 DIAGNOSIS — Z6841 Body Mass Index (BMI) 40.0 and over, adult: Secondary | ICD-10-CM | POA: Diagnosis not present

## 2015-02-12 DIAGNOSIS — K219 Gastro-esophageal reflux disease without esophagitis: Secondary | ICD-10-CM | POA: Insufficient documentation

## 2015-02-12 DIAGNOSIS — I2510 Atherosclerotic heart disease of native coronary artery without angina pectoris: Secondary | ICD-10-CM | POA: Diagnosis not present

## 2015-02-12 DIAGNOSIS — I70412 Atherosclerosis of autologous vein bypass graft(s) of the extremities with intermittent claudication, left leg: Secondary | ICD-10-CM | POA: Insufficient documentation

## 2015-02-12 DIAGNOSIS — I70212 Atherosclerosis of native arteries of extremities with intermittent claudication, left leg: Secondary | ICD-10-CM | POA: Diagnosis not present

## 2015-02-12 DIAGNOSIS — I10 Essential (primary) hypertension: Secondary | ICD-10-CM | POA: Diagnosis not present

## 2015-02-12 DIAGNOSIS — I252 Old myocardial infarction: Secondary | ICD-10-CM | POA: Insufficient documentation

## 2015-02-12 HISTORY — PX: PERIPHERAL VASCULAR CATHETERIZATION: SHX172C

## 2015-02-12 LAB — POCT I-STAT, CHEM 8
BUN: 17 mg/dL (ref 6–20)
Calcium, Ion: 1.13 mmol/L (ref 1.12–1.23)
Chloride: 102 mmol/L (ref 101–111)
Creatinine, Ser: 1.1 mg/dL — ABNORMAL HIGH (ref 0.44–1.00)
Glucose, Bld: 190 mg/dL — ABNORMAL HIGH (ref 65–99)
HEMATOCRIT: 44 % (ref 36.0–46.0)
HEMOGLOBIN: 15 g/dL (ref 12.0–15.0)
POTASSIUM: 4.1 mmol/L (ref 3.5–5.1)
Sodium: 138 mmol/L (ref 135–145)
TCO2: 25 mmol/L (ref 0–100)

## 2015-02-12 SURGERY — ABDOMINAL AORTOGRAM
Anesthesia: LOCAL

## 2015-02-12 MED ORDER — PHENOL 1.4 % MT LIQD
1.0000 | OROMUCOSAL | Status: DC | PRN
  Filled 2015-02-12: qty 177

## 2015-02-12 MED ORDER — HYDRALAZINE HCL 20 MG/ML IJ SOLN: 5.0000 mg | INTRAMUSCULAR | Status: DC | PRN

## 2015-02-12 MED ORDER — ONDANSETRON HCL 4 MG/2ML IJ SOLN: 4.0000 mg | Freq: Four times a day (QID) | INTRAMUSCULAR | Status: DC | PRN

## 2015-02-12 MED ORDER — OXYCODONE-ACETAMINOPHEN 5-325 MG PO TABS
2.0000 | ORAL_TABLET | Freq: Four times a day (QID) | ORAL | Status: DC | PRN
  Administered 2015-02-12: 2 via ORAL

## 2015-02-12 MED ORDER — SODIUM CHLORIDE 0.9 % IV SOLN
INTRAVENOUS | Status: DC
  Administered 2015-02-12: 09:00:00 via INTRAVENOUS

## 2015-02-12 MED ORDER — IODIXANOL 320 MG/ML IV SOLN
INTRAVENOUS | Status: DC | PRN
  Administered 2015-02-12: 115 mL via INTRA_ARTERIAL

## 2015-02-12 MED ORDER — LIDOCAINE HCL (PF) 1 % IJ SOLN
INTRAMUSCULAR | Status: DC | PRN
  Administered 2015-02-12: 11:00:00

## 2015-02-12 MED ORDER — ALUM & MAG HYDROXIDE-SIMETH 200-200-20 MG/5ML PO SUSP
15.0000 mL | ORAL | Status: DC | PRN
  Filled 2015-02-12: qty 30

## 2015-02-12 MED ORDER — LIDOCAINE HCL (PF) 1 % IJ SOLN
INTRAMUSCULAR | Status: AC
Start: 2015-02-12 — End: 2015-02-12
  Filled 2015-02-12: qty 30

## 2015-02-12 MED ORDER — FENTANYL CITRATE (PF) 100 MCG/2ML IJ SOLN
INTRAMUSCULAR | Status: DC | PRN
  Administered 2015-02-12 (×2): 25 ug via INTRAVENOUS

## 2015-02-12 MED ORDER — MIDAZOLAM HCL 2 MG/2ML IJ SOLN
INTRAMUSCULAR | Status: DC | PRN
  Administered 2015-02-12 (×2): 1 mg via INTRAVENOUS

## 2015-02-12 MED ORDER — FENTANYL CITRATE (PF) 100 MCG/2ML IJ SOLN
INTRAMUSCULAR | Status: AC
Start: 2015-02-12 — End: 2015-02-12
  Filled 2015-02-12: qty 2

## 2015-02-12 MED ORDER — DOCUSATE SODIUM 100 MG PO CAPS: 100.0000 mg | ORAL_CAPSULE | Freq: Every day | ORAL | Status: DC

## 2015-02-12 MED ORDER — METOPROLOL TARTRATE 1 MG/ML IV SOLN: 2.0000 mg | INTRAVENOUS | Status: DC | PRN

## 2015-02-12 MED ORDER — SODIUM CHLORIDE 0.9 % IV SOLN: 1.0000 mL/kg/h | INTRAVENOUS | Status: DC

## 2015-02-12 MED ORDER — OXYCODONE-ACETAMINOPHEN 5-325 MG PO TABS
ORAL_TABLET | ORAL | Status: DC
Start: 2015-02-12 — End: 2015-02-12
  Filled 2015-02-12: qty 2

## 2015-02-12 MED ORDER — GUAIFENESIN-DM 100-10 MG/5ML PO SYRP
15.0000 mL | ORAL_SOLUTION | ORAL | Status: DC | PRN
  Filled 2015-02-12: qty 15

## 2015-02-12 MED ORDER — ACETAMINOPHEN 325 MG PO TABS
325.0000 mg | ORAL_TABLET | ORAL | Status: DC | PRN
  Filled 2015-02-12: qty 2

## 2015-02-12 MED ORDER — LABETALOL HCL 5 MG/ML IV SOLN: 10.0000 mg | INTRAVENOUS | Status: DC | PRN

## 2015-02-12 MED ORDER — HEPARIN (PORCINE) IN NACL 2-0.9 UNIT/ML-% IJ SOLN
INTRAMUSCULAR | Status: AC
  Filled 2015-02-12: qty 1000

## 2015-02-12 MED ORDER — ACETAMINOPHEN 325 MG RE SUPP
325.0000 mg | RECTAL | Status: DC | PRN
  Filled 2015-02-12: qty 2

## 2015-02-12 MED ORDER — MIDAZOLAM HCL 2 MG/2ML IJ SOLN
INTRAMUSCULAR | Status: AC
  Filled 2015-02-12: qty 2

## 2015-02-12 SURGICAL SUPPLY — 15 items
CATH OMNI FLUSH 5F 65CM (CATHETERS) ×1 IMPLANT
CATH QUICKCROSS SUPP .035X90CM (MICROCATHETER) ×1 IMPLANT
CATH SOFT-VU 4F 65 STRAIGHT (CATHETERS) IMPLANT
CATH SOFT-VU STRAIGHT 4F 65CM (CATHETERS) ×2
CATH STRAIGHT 5FR 65CM (CATHETERS) ×1 IMPLANT
DEVICE TORQUE H2O (MISCELLANEOUS) ×1 IMPLANT
GUIDEWIRE ANGLED .035X150CM (WIRE) ×1 IMPLANT
KIT MICROINTRODUCER STIFF 5F (SHEATH) ×1 IMPLANT
KIT PV (KITS) ×2 IMPLANT
SHEATH PINNACLE 5F 10CM (SHEATH) ×1 IMPLANT
SYR MEDRAD MARK V 150ML (SYRINGE) ×2 IMPLANT
TRANSDUCER W/STOPCOCK (MISCELLANEOUS) ×2 IMPLANT
TRAY PV CATH (CUSTOM PROCEDURE TRAY) ×2 IMPLANT
WIRE BENTSON .035X145CM (WIRE) ×1 IMPLANT
WIRE ROSEN-J .035X180CM (WIRE) ×1 IMPLANT

## 2015-02-12 NOTE — Op Note (Signed)
    Patient name: Tammy [name] MRN: [id number] DOB: May 06, 1955 Sex: female  02/12/2015 Pre-operative Diagnosis: Left leg claudication Post-operative diagnosis:  Same Surgeon:  Durene CalBrabham, Wells Procedure Performed:  1.  Ultrasound-guided access, right femoral artery  2.  Abdominal aortogram  3.  Second order catheterization  4.  Left lower extremity runoff    Indications:  Patient has left leg claudication.  Ultrasound suggested superficial femoral artery occlusion of the left.  She is here today for further evaluation and possible intervention  Procedure:  The patient was identified in the holding area and taken to room 8.  The patient was then placed supine on the table and prepped and draped in the usual sterile fashion.  A time out was called.  Ultrasound was used to evaluate the right common femoral artery.  It was patent .  A digital ultrasound image was acquired.  A micropuncture needle was used to access the right common femoral artery under ultrasound guidance.  An 018 wire was advanced without resistance and a micropuncture sheath was placed.  The 018 wire was removed and a benson wire was placed.  The micropuncture sheath was exchanged for a 5 french sheath.  An omniflush catheter was advanced over the wire to the level of L-1.  An abdominal angiogram was obtained.  Next, using the omniflush catheter and a benson wire, the aortic bifurcation was crossed and the catheter was placed into theleft external iliac artery and left runoff was obtained.   Findings:   Aortogram:  No significant renal artery stenosis is identified.  The infrarenal abdominal aorta is widely patent.  Bilateral common and external iliac arteries are widely patent.  Right Lower Extremity:  Not evaluated  Left Lower Extremity:  The left common femoral profunda femoral artery are occluded.  There is a flush occlusion of the left superficial femoral artery with reconstitution of the proximal above-knee popliteal  artery.  The popliteal artery is patent throughout it's course.  There is single vessel runoff via the posterior tibial artery  Intervention:  None  Impression:  #1  flush occlusion of the left superficial femoral artery with reconstitution of the above-knee popliteal artery and single vessel runoff via the posterior tibial artery    V. Durene CalWells Kendrik Mcshan, M.D. [facility] [place] Office: [phone] Pager:  [phone]

## 2015-02-12 NOTE — Discharge Instructions (Signed)
Angiogram, Care After °Refer to this sheet in the next few weeks. These instructions provide you with information about caring for yourself after your procedure. Your health care provider may also give you more specific instructions. Your treatment has been planned according to current medical practices, but problems sometimes occur. Call your health care provider if you have any problems or questions after your procedure. °WHAT TO EXPECT AFTER THE PROCEDURE °After your procedure, it is typical to have the following: °· Bruising at the catheter insertion site that usually fades within 1-2 weeks. °· Blood collecting in the tissue (hematoma) that may be painful to the touch. It should usually decrease in size and tenderness within 1-2 weeks. °HOME CARE INSTRUCTIONS °· Take medicines only as directed by your health care provider. °· You may shower 24-48 hours after the procedure or as directed by your health care provider. Remove the bandage (dressing) and gently wash the site with plain soap and water. Pat the area dry with a clean towel. Do not rub the site, because this may cause bleeding. °· Do not take baths, swim, or use a hot tub until your health care provider approves. °· Check your insertion site every day for redness, swelling, or drainage. °· Do not apply powder or lotion to the site. °· Do not lift over 10 lb (4.5 kg) for 5 days after your procedure or as directed by your health care provider. °· Ask your health care provider when it is okay to: °¨ Return to work or school. °¨ Resume usual physical activities or sports. °¨ Resume sexual activity. °· Do not drive home if you are discharged the same day as the procedure. Have someone else drive you. °· You may drive 24 hours after the procedure unless otherwise instructed by your health care provider. °· Do not operate machinery or power tools for 24 hours after the procedure or as directed by your health care provider. °· If your procedure was done as an  outpatient procedure, which means that you went home the same day as your procedure, a responsible adult should be with you for the first 24 hours after you arrive home. °· Keep all follow-up visits as directed by your health care provider. This is important. °SEEK MEDICAL CARE IF: °· You have a fever. °· You have chills. °· You have increased bleeding from the catheter insertion site. Hold pressure on the site and call 911. °SEEK IMMEDIATE MEDICAL CARE IF: °· You have unusual pain at the catheter insertion site. °· You have redness, warmth, or swelling at the catheter insertion site. °· You have drainage (other than a small amount of blood on the dressing) from the catheter insertion site. °· The catheter insertion site is bleeding, and the bleeding does not stop after 30 minutes of holding steady pressure on the site. °· The area near or just beyond the catheter insertion site becomes pale, cool, tingly, or numb. °  °This information is not intended to replace advice given to you by your health care provider. Make sure you discuss any questions you have with your health care provider. °  °Document Released: 09/04/2004 Document Revised: 03/09/2014 Document Reviewed: 07/20/2012 °Elsevier Interactive Patient Education ©2016 [employer] ° °

## 2015-02-12 NOTE — Interval H&P Note (Signed)
History and Physical Interval Note:  02/12/2015 9:16 AM  Tammy [name]  has presented today for surgery, with the diagnosis of pvd with bilateral claudication  The various methods of treatment have been discussed with the patient and family. After consideration of risks, benefits and other options for treatment, the patient has consented to  Procedure(s): Abdominal Aortogram (N/A) as a surgical intervention .  The patient's history has been reviewed, patient examined, no change in status, stable for surgery.  I have reviewed the patient's chart and labs.  Questions were answered to the patient's satisfaction.     Durene CalBrabham, Wells

## 2015-02-12 NOTE — H&P (View-Only) (Signed)
   Patient name: Tammy [name] MRN: [id number] DOB: 01/10/1956 Sex: female     Chief Complaint  Patient presents with  . Re-evaluation    2 month f/u - not able to take RX cilostazol due to severe headaches    HISTORY OF PRESENT ILLNESS:  the patient is back for follow-up of her claudication. When I first saw her, I elected to treat her medically initially. Biggest complaint is in her left leg.  She is only walking approximately 20 feet now before she gets pain. She tried ilostazol, however developed severe headaches and had to discontinue it.  Patient suffered a myocardial infarction over the summer. She underwent cardiac catheterization. Medical therapy was recommended. The patient currently takes a statin for hypercholesterolemia. She is on multiple medications for hypertension. She continues to smoke half a pack a day but is trying to cut back. She does suffer from morbid obesity and would like to lose weight but is limited by her activity.  Past Medical History  Diagnosis Date  . Ulcer   . GERD (gastroesophageal reflux disease)   . Essential hypertension   . Abnormal mammogram 10.25.11    area of density with several adjacent amorphous calcifications located laterally w/in the right breast at approx 9-10 o'clock position likely represents area of evolving far necrosis; f/u mamo and U/S in 6 months  . Morbid obesity (HCC)   . CAD (coronary artery disease)     a. 07/2014 NSTEMI/Cath: LM nl, LAD 30ost/m, 60d, LCX 20p, OM1 20, OM2 40, OM3 50, RCA 30p, 60m, 20d, RPDA 40, EF 65%-->Med Rx;  b. 07/2014 Echo: EF 55-60%, no rwma, mildly dil LA.  . PAD (peripheral artery disease) (HCC)     a. 07/2014 ABI's: R 0.88, L 0.32.  . Hyperlipidemia     Past Surgical History  Procedure Laterality Date  . Cesarean section    . Upper gastrointestinal endoscopy  1991  . Trigger finger release Left     middle finger  . Cardiac catheterization N/A 07/16/2014    Procedure: Left Heart Cath and  Coronary Angiography;  Surgeon: Christopher D McAlhany, MD;  Location: [facility] INVASIVE CV LAB;  Service: Cardiovascular;  Laterality: N/A;    Social History   Social History  . Marital Status: Single    Spouse Name: N/A  . Number of Children: N/A  . Years of Education: N/A   Occupational History  . Not on file.   Social History Main Topics  . Smoking status: Current Every Day Smoker -- 1.00 packs/day for 30 years    Types: Cigarettes  . Smokeless tobacco: Never Used     Comment: does not want to quit  . Alcohol Use: No  . Drug Use: No  . Sexual Activity: Not Currently    Birth Control/ Protection: Post-menopausal, Abstinence   Other Topics Concern  . Not on file   Social History Narrative    Family History  Problem Relation Age of Onset  . Cancer Mother     Bladder cancer, cause of death  . Diabetes Mother   . Diabetes Father   . Hypertension Father   . Hypertension Sister   . Hypertension Brother   . Cancer Maternal Aunt   . Breast cancer Maternal Aunt   . Ovarian cancer Maternal Aunt     Allergies as of 01/28/2015 - Review Complete 01/28/2015  Allergen Reaction Noted  . Ace inhibitors  10/14/2007  . Hydrochlorothiazide  02/28/2010    Current Outpatient Prescriptions   on File Prior to Visit  Medication Sig Dispense Refill  . amLODipine (NORVASC) 10 MG tablet TAKE 1 TABLET (10 MG TOTAL) BY MOUTH DAILY. 30 tablet 3  . aspirin 81 MG EC tablet Take 1 tablet (81 mg total) by mouth daily. 30 tablet 6  . atorvastatin (LIPITOR) 80 MG tablet Take 1 tablet (80 mg total) by mouth daily at 6 PM. 30 tablet 11  . clopidogrel (PLAVIX) 75 MG tablet Take 1 tablet (75 mg total) by mouth daily. 30 tablet 11  . fluconazole (DIFLUCAN) 150 MG tablet Take 1 tablet (150 mg total) by mouth once. 1 tablet 0  . metoprolol (LOPRESSOR) 100 MG tablet Take 1 tablet (100 mg total) by mouth 2 (two) times daily. 60 tablet 6  . nitroGLYCERIN (NITROSTAT) 0.4 MG SL tablet Place 1 tablet (0.4 mg  total) under the tongue every 5 (five) minutes as needed for chest pain. 25 tablet 3  . traMADol (ULTRAM) 50 MG tablet Take 50 mg by mouth every 8 (eight) hours as needed (pain).    . cilostazol (PLETAL) 100 MG tablet Take 1 tablet (100 mg total) by mouth 2 (two) times daily before a meal. (Patient not taking: Reported on 01/28/2015) 60 tablet 11   No current facility-administered medications on file prior to visit.     REVIEW OF SYSTEMS:  see history of present illness, otherwise no changes from prior visit  PHYSICAL EXAMINATION:   Vital signs are  Filed Vitals:   01/28/15 0857  BP: 137/69  Pulse: 63  Height: 5\' 6"  (1.676 m)  Weight: 351 lb 4.8 oz (159.349 kg)  SpO2: 97%   Body mass index is 56.73 kg/(m^2). General: The patient appears their stated age. HEENT:  No gross abnormalities Pulmonary:  Non labored breathing Musculoskeletal: There are no major deformities. Neurologic: No focal weakness or paresthesias are detected, Skin: There are no ulcer or rashes noted. Psychiatric: The patient has normal affect. Cardiovascular: There is a regular rate and rhythm without significant murmur appreciated.   Diagnostic Studies  none  Assessment:   Bilateral claudication, left greater than right Plan:  I again stressed the importance of medical therapy.  Unfortunately, the patient could not tolerate cilostazol secondary to headaches.  Therefore I have elected to proceed with angiography.  The patient is going to discuss this further with her daughter. When we proceed, I will plan on cannulation of the right groin with aortogram, bilateral runoff, and intervention on the left leg if possible.  We discussed the risks and benefits including the risk of bleeding, the risk of not being able to cross her occlusion, distal embolization. She is going to contact us with a procedure date.  Jorge NyV. Wells Zamariah Seaborn IV, M.D. [facility] [place] Office: [phone] Pager:   [phone]

## 2015-02-12 NOTE — Progress Notes (Signed)
Site area: rfa Site Prior to Removal:  Level 0 Pressure Applied For:2720min Manual:   yes Patient Status During Pull:  stable Post Pull Site:  Level 0 Post Pull Instructions Given:  yes Post Pull Pulses Present: palpable Dressing Applied:  clear Bedrest begins @ 1145 till 1545  Comments:

## 2015-02-13 ENCOUNTER — Encounter (HOSPITAL_COMMUNITY): Payer: Self-pay | Admitting: Surgery

## 2015-02-14 ENCOUNTER — Telehealth: Payer: Self-pay | Admitting: Surgery

## 2015-02-14 NOTE — Telephone Encounter (Signed)
Lm or pt re appt, dpm

## 2015-02-14 NOTE — Telephone Encounter (Signed)
-----   Message from Phillips Odorarol S Pullins, RN sent at 02/12/2015 12:05 PM EST ----- Regarding: needs 4-6 wk. f/u with [facility]   ----- Message -----    From: Nada LibmanVance W Brabham, MD    Sent: 02/12/2015  11:02 AM      To: [facility] Charge Pool  02/12/2015:  Surgeon:  Durene CalBrabham, Wells Procedure Performed:  1.  Ultrasound-guided access, right femoral artery  2.  Abdominal aortogram  3.  Second order catheterization  4.  Left lower extremity runoff  Schedule follow-up in 4-6 weeks with me

## 2015-02-28 ENCOUNTER — Other Ambulatory Visit: Payer: Self-pay | Admitting: Family Medicine

## 2015-03-01 NOTE — Telephone Encounter (Signed)
2nd request.  Trinetta Alemu L, RN  

## 2015-03-08 ENCOUNTER — Encounter: Payer: Self-pay | Admitting: Surgery

## 2015-03-18 ENCOUNTER — Encounter: Payer: Medicaid Other | Admitting: Surgery

## 2015-03-22 ENCOUNTER — Encounter: Payer: Self-pay | Admitting: Surgery

## 2015-04-01 ENCOUNTER — Ambulatory Visit (INDEPENDENT_AMBULATORY_CARE_PROVIDER_SITE_OTHER): Payer: Medicaid Other | Admitting: Surgery

## 2015-04-01 ENCOUNTER — Encounter: Payer: Self-pay | Admitting: Surgery

## 2015-04-01 VITALS — BP 123/77 | HR 56 | Temp 97.6°F | Resp 14 | Ht 66.0 in | Wt 347.0 lb

## 2015-04-01 DIAGNOSIS — I70212 Atherosclerosis of native arteries of extremities with intermittent claudication, left leg: Secondary | ICD-10-CM

## 2015-04-01 NOTE — Progress Notes (Signed)
Patient name: Tammy [name] MRN: [id number] DOB: [birth year] Sex: female     Chief Complaint  Patient presents with  . Re-evaluation    4-6 wk Per. Vasc. Cath. f/u  02-12-15    HISTORY OF PRESENT ILLNESS: The patient is back for follow-up of her claudication. When I first saw her, I elected to treat her medically initially. Biggest complaint is in her left leg. She is only walking approximately 20 feet now before she gets pain. She tried cilostazol, however developed severe headaches and had to discontinue it.  Therefore, she underwent angiography on 02/12/2015 where a flush occlusion of her left superficial femoral artery was encountered.  No percutaneous intervention was performed.  She continues to have claudication but appears to be able to tolerate her severe symptoms.  She denies rest pain or ulcers.  Patient suffered a myocardial infarction over the summer. She underwent cardiac catheterization. Medical therapy was recommended. The patient currently takes a statin for hypercholesterolemia. She is on multiple medications for hypertension. She continues to smoke half a pack a day but is trying to cut back. She does suffer from morbid obesity and would like to lose weight but is limited by her activity.   Past Medical History  Diagnosis Date  . Ulcer   . GERD (gastroesophageal reflux disease)   . Essential hypertension   . Abnormal mammogram 10.25.11    area of density with several adjacent amorphous calcifications located laterally w/in the right breast at approx 9-10 o'clock position likely represents area of evolving far necrosis; f/u mamo and U/S in 6 months  . Morbid obesity (HCC)   . CAD (coronary artery disease)     a. 07/2014 NSTEMI/Cath: LM nl, LAD 30ost/m, 60d, LCX 20p, OM1 20, OM2 40, OM3 50, RCA 30p, 67m, 20d, RPDA 40, EF 65%-->Med Rx;  b. 07/2014 Echo: EF 55-60%, no rwma, mildly dil LA.  Marland Kitchen PAD (peripheral artery disease) (HCC)     a. 07/2014 ABI's: R 0.88, L  0.32.  Marland Kitchen Hyperlipidemia     Past Surgical History  Procedure Laterality Date  . Cesarean section    . Upper gastrointestinal endoscopy  1991  . Trigger finger release Left     middle finger  . Cardiac catheterization N/A 07/16/2014    Procedure: Left Heart Cath and Coronary Angiography;  Surgeon: Kathleene Hazel, MD;  Location: [facility] INVAS[facility] CV LAB;  Service: Cardiovascular;  Laterality: N/A;  . Peripheral vascular catheterization N/A 02/12/2015    Procedure: Abdominal Aortogram;  Surgeon: Nada Libman, MD;  Location: [facility] INVASIVE CV LAB;  Service: Cardiovascular;  Laterality: N/A;    Social History   Social History  . Marital Status: Single    Spouse Name: N/A  . Number of Children: N/A  . Years of Education: N/A   Occupational History  . Not on file.   Social History Main Topics  . Smoking status: Current Every Day Smoker -- 1.00 packs/day for 30 years    Types: Cigarettes  . Smokeless tobacco: Never Used     Comment: does not want to quit  . Alcohol Use: No  . Drug Use: No  . Sexual Activity: Not Currently    Birth Control/ Protection: Post-menopausal, Abstinence   Other Topics Concern  . Not on file   Social History Narrative    Family History  Problem Relation Age of Onset  . Cancer Mother     Bladder cancer, cause of death  . Diabetes Mother   .  Diabetes Father   . Hypertension Father   . Hypertension Sister   . Hypertension Brother   . Cancer Maternal Aunt   . Breast cancer Maternal Aunt   . Ovarian cancer Maternal Aunt     Allergies as of 04/01/2015 - Review Complete 04/01/2015  Allergen Reaction Noted  . Ace inhibitors  10/14/2007  . Hydrochlorothiazide  02/28/2010    Current Outpatient Prescriptions on File Prior to Visit  Medication Sig Dispense Refill  . amLODipine (NORVASC) 10 MG tablet TAKE 1 TABLET (10 MG TOTAL) BY MOUTH DAILY. 30 tablet 3  . aspirin 81 MG EC tablet Take 1 tablet (81 mg total) by mouth daily. 30 tablet 6  .  atorvastatin (LIPITOR) 80 MG tablet Take 1 tablet (80 mg total) by mouth daily at 6 PM. 30 tablet 11  . benzocaine (ORAJEL) 10 % mucosal gel Use as directed 1 application in the mouth or throat as needed for mouth pain.    . cilostazol (PLETAL) 100 MG tablet Take 1 tablet (100 mg total) by mouth 2 (two) times daily before a meal. 60 tablet 11  . clopidogrel (PLAVIX) 75 MG tablet Take 1 tablet (75 mg total) by mouth daily. 30 tablet 11  . metoprolol (LOPRESSOR) 100 MG tablet Take 1 tablet (100 mg total) by mouth 2 (two) times daily. 60 tablet 6  . nitroGLYCERIN (NITROSTAT) 0.4 MG SL tablet Place 1 tablet (0.4 mg total) under the tongue every 5 (five) minutes as needed for chest pain. 25 tablet 3   No current facility-administered medications on file prior to visit.     REVIEW OF SYSTEMS: See history of present illness otherwise negative  PHYSICAL EXAMINATION:   Vital signs are  Filed Vitals:   04/01/15 1419  BP: 123/77  Pulse: 56  Temp: 97.6 F (36.4 C)  TempSrc: Oral  Resp: 14  Height:  (1.676 m)  Weight: 347 lb (157.398 kg)  SpO2: 98%   Body mass index is 56.03 kg/(m^2). General: The patient appears their stated age. HEENT:  No gross abnormalities Pulmonary:  Non labored breathing Abdomen: Soft and non-tender Musculoskeletal: There are no major deformities. Neurologic: No focal weakness or paresthesias are detected, Skin: There are no ulcer or rashes noted. Psychiatric: The patient has normal affect. Cardiovascular: Nonpalpable left sided pulses   Diagnostic Studies None  Assessment: Atherosclerosis with claudication Plan: I discussed with the patient that if she can tolerate her symptoms, she should not proceed with surgical intervention.  I think she is at high risk for wound complications, giving her obesity.  She is in agreement and does not want to have an operation at this time.  I discussed contacting me should she develop ulcers or rest pain.  Otherwise she  will follow up in 6 months  V. Charlena Cross, M.D. [facility] Office: [phone] Pager:  [phone]

## 2015-04-29 ENCOUNTER — Ambulatory Visit (INDEPENDENT_AMBULATORY_CARE_PROVIDER_SITE_OTHER): Payer: Medicaid Other | Admitting: Family Medicine

## 2015-04-29 ENCOUNTER — Encounter: Payer: Self-pay | Admitting: Family Medicine

## 2015-04-29 VITALS — BP 149/108 | HR 67 | Temp 98.0°F | Ht 66.0 in | Wt 347.0 lb

## 2015-04-29 DIAGNOSIS — Z0000 Encounter for general adult medical examination without abnormal findings: Secondary | ICD-10-CM

## 2015-04-29 DIAGNOSIS — Z23 Encounter for immunization: Secondary | ICD-10-CM | POA: Diagnosis not present

## 2015-04-29 DIAGNOSIS — I10 Essential (primary) hypertension: Secondary | ICD-10-CM

## 2015-04-29 DIAGNOSIS — M25571 Pain in right ankle and joints of right foot: Secondary | ICD-10-CM

## 2015-04-29 DIAGNOSIS — I2510 Atherosclerotic heart disease of native coronary artery without angina pectoris: Secondary | ICD-10-CM

## 2015-04-29 DIAGNOSIS — F17200 Nicotine dependence, unspecified, uncomplicated: Secondary | ICD-10-CM

## 2015-04-29 DIAGNOSIS — I739 Peripheral vascular disease, unspecified: Secondary | ICD-10-CM

## 2015-04-29 NOTE — Assessment & Plan Note (Signed)
Doing well  Followed by vascular surgery

## 2015-04-29 NOTE — Assessment & Plan Note (Signed)
Diet and exercise discussed at length with patient  Continue to follow-up

## 2015-04-29 NOTE — Assessment & Plan Note (Signed)
Patient without any anginal symptoms currently Advised her to follow up with cardiology

## 2015-04-29 NOTE — Progress Notes (Signed)
Subjective:   NYLAN NAKATANI is a 60 y.o. female with a history of HTN, NSTEMI, PAD, CAD, HLD, tobacco abuse, morbid obesity here for HTN, PAD, CAD, R leg pain  HTN: - Medications: amlodipine  daily, metoprolol  BID - Compliance: good, but hasn't taken them yet this AM - Checking BP at home: no - Denies any SOB, CP, vision changes, LE edema, medication SEs, or symptoms of hypotension - Diet: not eating fried foods or fast foods.  Eats mostly broiled and boiled foods, staying away from bread - Exercise:   Tobacco Abuse - reports she is under stress and smoking more - not ready to quit yet - a little more than 1/2 ppd - amount she smokes depends on stress level  CAD, s/p NSTEMI - taking plavix and ASA - hasnt seen cardiology since 08/2014 - no CP or SOB  PAD - leg hasnt been bothering her very much recently - followed by vascular surgery  r leg pain - throbbing pain - 1 wk duration - no injury or trauma - travels from R medial ankle up into calf - intermittent - worse with walking - can also feel pulling pain when laying in bed - tried massage, but made it hurt worse - does have PAD - but this feels different   Review of Systems:  Per HPI.   Social History: current smoker  Objective:  BP 149/108 mmHg  Pulse 67  Temp(Src) 98 F (36.7 C) (Oral)  Ht  (1.676 m)  Wt 347 lb (157.398 kg)  BMI 56.03 kg/m2  Gen:  60 y.o. female in NAD HEENT: NCAT, MMM, EOMI, PERRL, anicteric sclerae CV: RRR, no MRG Resp: Non-labored, CTAB, no wheezes noted Abd: Soft, NTND, BS present, no guarding or organomegaly Ext: WWP, no edema MSK: R ankle: Full ROM, strength intact, sensation intact,  No swelling or erythema, tenderness to palpation around medial malleolus,  Gait normal Neuro: Alert and oriented, speech normal      Chemistry      Component Value Date/Time   NA 138 02/12/2015 0842   K 4.1 02/12/2015 0842   CL 102 02/12/2015 0842   CO2 22 07/17/2014 0247   BUN 17 02/12/2015 0842   CREATININE 1.10* 02/12/2015 0842   CREATININE 1.05 11/14/2013 1204      Component Value Date/Time   CALCIUM 9.3 07/17/2014 0247   ALKPHOS 86 11/02/2008 2007   AST 14 11/02/2008 2007   ALT 9 11/02/2008 2007   BILITOT 0.5 11/02/2008 2007      Lab Results  Component Value Date   WBC 3.9* 07/17/2014   HGB 15.0 02/12/2015   HCT 44.0 02/12/2015   MCV 82.9 07/17/2014   PLT 176 07/17/2014   Lab Results  Component Value Date   TSH 3.481 07/15/2014   Lab Results  Component Value Date   HGBA1C 5.6 07/15/2014   Assessment & Plan:     ALYSS GRANATO is a 60 y.o. female here for  HTN, CAD, PAD, right ankle pain , tobacco abuse  HYPERTENSION, BENIGN SYSTEMIC  Previously well-controlled, but patient has not taken her medications today and it is above goal Advised patient to follow-up in one month an appointment later in the day than she takes her antihypertensives   continue current medications at this time  PAD (peripheral artery disease)  Doing well  Followed by vascular surgery  CAD (coronary artery disease)  Patient without any anginal symptoms currently Advised her to follow up with cardiology  TOBACCO DEPENDENCE  Patient not ready to quit smoking at this time  discussed benefits of cessation with her  We will continue to address  Morbid obesity  Diet and exercise discussed at length with patient  Continue to follow-up  Right ankle pain  Given location of tenderness, suspect tendinitis  patient should avoid NSAIDs given heart problems Advised rest , stretching  return precautions discussed  Healthcare maintenance  Flu shot given today Patient given information about mammogram and colonoscopy -  She is to call and make appointments to schedule these     Erasmo Downer, MD MPH PGY-2,  [facility] 04/29/2015  12:07 PM

## 2015-04-29 NOTE — Assessment & Plan Note (Signed)
Flu shot given today Patient given information about mammogram and colonoscopy -  She is to call and make appointments to schedule these

## 2015-04-29 NOTE — Patient Instructions (Signed)
Nice to see you again today. Please call the breast center and schedule a mammogram. Please call the gastroenterologist and schedule colonoscopy. Please call the cardiologist and schedule a follow-up appointment with them. Their phone number is [phone].    I think your right leg pain is related to a pulled muscle or tendon. Do the stretches below to help this. It will take time to get better.  Come back and see me in one month to follow-up on your blood pressure. Make your appointment after you take your blood pressure medication for the day.  Take care, Dr. B   Generic Ankle Exercises EXERCISES RANGE OF MOTION (ROM) AND STRETCHING EXERCISES These exercises may help you when beginning to rehabilitate your injury. Your symptoms may resolve with or without further involvement from your physician, physical therapist or athletic trainer. While completing these exercises, remember:   Restoring tissue flexibility helps normal motion to return to the joints. This allows healthier, less painful movement and activity.  An effective stretch should be held for at least 30 seconds.  A stretch should never be painful. You should only feel a gentle lengthening or release in the stretched tissue. RANGE OF MOTION - Dorsi/Plantar Flexion  While sitting with your right / left knee straight, draw the top of your foot upwards by flexing your ankle. Then reverse the motion, pointing your toes downward.  Hold each position for __________ seconds.  After completing your first set of exercises, repeat this exercise with your knee bent. Repeat __________ times. Complete this exercise __________ times per day.  RANGE OF MOTION - Ankle Alphabet  Imagine your right / left big toe is a pen.  Keeping your hip and knee still, write out the entire alphabet with your "pen." Make the letters as large as you can without increasing any discomfort. Repeat __________ times. Complete this exercise __________ times  per day.  RANGE OF MOTION - Ankle Dorsiflexion, Active Assisted   Remove shoes and sit on a chair that is preferably not on a carpeted surface.  Place right / left foot under knee. Extend your opposite leg for support.  Keeping your heel down, slide your right / left foot back toward the chair until you feel a stretch at your ankle or calf. If you do not feel a stretch, slide your bottom forward to the edge of the chair while still keeping your heel down.  Hold this stretch for __________ seconds. Repeat __________ times. Complete this stretch __________ times per day.  STRENGTHENING EXERCISES  These exercises may help you when beginning to rehabilitate your injury. They may resolve your symptoms with or without further involvement from your physician, physical therapist or athletic trainer. While completing these exercises, remember:   Muscles can gain both the endurance and the strength needed for everyday activities through controlled exercises.  Complete these exercises as instructed by your physician, physical therapist or athletic trainer. Progress the resistance and repetitions only as guided.  You may experience muscle soreness or fatigue, but the pain or discomfort you are trying to eliminate should never worsen during these exercises. If this pain does worsen, stop and make certain you are following the directions exactly. If the pain is still present after adjustments, discontinue the exercise until you can discuss the trouble with your clinician. STRENGTH - Dorsiflexors  Secure a rubber exercise band/tubing to a fixed object (table, pole) and loop the other end around your right / left foot.  Sit on the floor facing the fixed  object. The band/tubing should be slightly tense when your foot is relaxed.  Slowly draw your foot back toward you using your ankle and toes.  Hold this position for __________ seconds. Slowly release the tension in the band and return your foot to the  starting position. Repeat __________ times. Complete this exercise __________ times per day.  STRENGTH - Plantar-flexors  Sit with your right / left leg extended. Holding onto both ends of a rubber exercise band/tubing, loop it around the ball of your foot. Keep a slight tension in the band.  Slowly push your toes away from you, pointing them downward.  Hold this position for __________ seconds. Return slowly, controlling the tension in the band/tubing. Repeat __________ times. Complete this exercise __________ times per day.  STRENGTH - Ankle Eversion  Secure one end of a rubber exercise band/tubing to a fixed object (table, pole). Loop the other end around your foot just before your toes.  Place your fists between your knees. This will focus your strengthening at your ankle.  Drawing the band/tubing across your opposite foot, slowly, pull your little toe out and up. Make sure the band/tubing is positioned to resist the entire motion.  Hold this position for __________ seconds.  Have your muscles resist the band/tubing as it slowly pulls your foot back to the starting position. Repeat __________ times. Complete this exercise __________ times per day.  STRENGTH - Ankle Inversion  Secure one end of a rubber exercise band/tubing to a fixed object (table, pole). Loop the other end around your foot just before your toes.  Place your fists between your knees. This will focus your strengthening at your ankle.  Slowly, pull your big toe up and in, making sure the band/tubing is positioned to resist the entire motion.  Hold this position for __________ seconds.  Have your muscles resist the band/tubing as it slowly pulls your foot back to the starting position. Repeat __________ times. Complete this exercises __________ times per day.  STRENGTH - Towel Curls  Sit in a chair positioned on a non-carpeted surface.  Place your foot on a towel, keeping your heel on the floor.  Pull the  towel toward your heel by only curling your toes. Keep your heel on the floor. If instructed by your physician, physical therapist or athletic trainer, add weight to the end of the towel. Repeat __________ times. Complete this exercise __________ times per day. STRENGTH - Plantar-flexors, Standing  Stand with your feet shoulder width apart. Steady yourself with a wall or table using as little support as needed.  Keeping your weight evenly spread over the width of your feet, rise up on your toes.*  Hold this position for __________ seconds. Repeat __________ times. Complete this exercise __________ times per day.  *If this is too easy, shift your weight toward your right / left leg until you feel challenged. Ultimately, you may be asked to do this exercise with your right / left foot only. BALANCE - Tandem Walking  Place your uninjured foot on a line 2-4 inches wide and at least 10 feet long.  Keeping your balance without using anything for extra support, place your right / left heel directly in front of your other foot.  Slowly raise your back foot up, lifting from the heel to the toes, and place it directly in front of the right / left foot.  Continue to walk along the line slowly. Walk for ____________________ feet. Repeat ____________________ times. Complete ____________________ times per day.  This information is not intended to replace advice given to you by your health care provider. Make sure you discuss any questions you have with your health care provider.   Document Released: 12/31/2004 Document Revised: 03/09/2014 Document Reviewed: 05/31/2008 Elsevier Interactive Patient Education [employer].

## 2015-04-29 NOTE — Assessment & Plan Note (Signed)
Patient not ready to quit smoking at this time  discussed benefits of cessation with her  We will continue to address

## 2015-04-29 NOTE — Assessment & Plan Note (Signed)
Previously well-controlled, but patient has not taken her medications today and it is above goal Advised patient to follow-up in one month an appointment later in the day than she takes her antihypertensives   continue current medications at this time

## 2015-04-29 NOTE — Assessment & Plan Note (Signed)
Given location of tenderness, suspect tendinitis  patient should avoid NSAIDs given heart problems Advised rest , stretching  return precautions discussed

## 2015-08-07 ENCOUNTER — Other Ambulatory Visit: Payer: Self-pay | Admitting: Nurse Practitioner

## 2015-08-07 ENCOUNTER — Other Ambulatory Visit: Payer: Self-pay | Admitting: *Deleted

## 2015-08-07 DIAGNOSIS — I10 Essential (primary) hypertension: Secondary | ICD-10-CM

## 2015-08-07 DIAGNOSIS — I739 Peripheral vascular disease, unspecified: Secondary | ICD-10-CM

## 2015-08-07 MED ORDER — METOPROLOL TARTRATE 100 MG PO TABS: 100.0000 mg | ORAL_TABLET | Freq: Two times a day (BID) | ORAL | Status: DC

## 2015-08-07 MED ORDER — AMLODIPINE BESYLATE 10 MG PO TABS: ORAL_TABLET | ORAL | Status: DC

## 2015-08-07 MED ORDER — CLOPIDOGREL BISULFATE 75 MG PO TABS: 75.0000 mg | ORAL_TABLET | Freq: Every day | ORAL | Status: DC

## 2015-08-07 NOTE — Telephone Encounter (Signed)
Needs refill on meds. Alessa Mazur Bruna PotterBlount, CMA

## 2015-09-30 ENCOUNTER — Other Ambulatory Visit: Payer: Self-pay | Admitting: *Deleted

## 2015-09-30 ENCOUNTER — Other Ambulatory Visit: Payer: Self-pay | Admitting: Nurse Practitioner

## 2015-09-30 ENCOUNTER — Ambulatory Visit: Payer: Medicaid Other | Admitting: Surgery

## 2015-09-30 DIAGNOSIS — I10 Essential (primary) hypertension: Secondary | ICD-10-CM

## 2015-09-30 DIAGNOSIS — I739 Peripheral vascular disease, unspecified: Secondary | ICD-10-CM

## 2015-09-30 MED ORDER — METOPROLOL TARTRATE 100 MG PO TABS: 100.0000 mg | ORAL_TABLET | Freq: Two times a day (BID) | ORAL | 6 refills | Status: DC

## 2015-09-30 MED ORDER — ATORVASTATIN CALCIUM 80 MG PO TABS: 80.0000 mg | ORAL_TABLET | Freq: Every day | ORAL | 11 refills | Status: DC

## 2015-09-30 NOTE — Telephone Encounter (Signed)
atorvastatin (LIPITOR) 80 MG tablet  Medication  Date: 09/30/2015 Department: [facility] Ordering/Authorizing: Erasmo Downer, MD  Order Providers   Prescribing Provider Encounter Provider  Erasmo Downer, MD Clovis Pu, RN  Medication Detail    Disp Refills Start End   atorvastatin (LIPITOR) 80 MG tablet 30 tablet 11 09/30/2015    Sig - Route: Take 1 tablet (80 mg total) by mouth daily at 6 PM. - Oral   E-Prescribing Status: Receipt confirmed by pharmacy (09/30/2015 2:41 PM EDT)   Associated Diagnoses   PAD (peripheral artery disease) [facility])     Pharmacy   [id number] - [place], [place] - [address]

## 2015-12-31 ENCOUNTER — Telehealth: Payer: Self-pay | Admitting: Family Medicine

## 2016-01-01 NOTE — Telephone Encounter (Signed)
Spoke with provider and refill was sent to pharmacy since patient was there waiting. Jazmin Hartsell,CMA

## 2016-01-01 NOTE — Telephone Encounter (Signed)
2nd request. Pt @ [facility] now. Pt is very upset her medication isn't ready. Please advise. Thanks! ep

## 2016-04-02 ENCOUNTER — Other Ambulatory Visit: Payer: Self-pay | Admitting: Family Medicine

## 2016-04-02 NOTE — Telephone Encounter (Signed)
Needs refill on amlodipine  [facility] [address]

## 2016-04-09 ENCOUNTER — Other Ambulatory Visit: Payer: Self-pay | Admitting: Family Medicine

## 2016-04-09 DIAGNOSIS — I10 Essential (primary) hypertension: Secondary | ICD-10-CM

## 2016-04-09 NOTE — Telephone Encounter (Signed)
Needs refill on metropolol.  [facility] on [address]

## 2016-04-10 MED ORDER — METOPROLOL TARTRATE 100 MG PO TABS
100.0000 mg | ORAL_TABLET | Freq: Two times a day (BID) | ORAL | 6 refills | Status: DC
Start: 2016-04-10 — End: 2017-01-04

## 2016-07-10 ENCOUNTER — Other Ambulatory Visit: Payer: Self-pay | Admitting: *Deleted

## 2016-07-10 MED ORDER — AMLODIPINE BESYLATE 10 MG PO TABS: 10.0000 mg | ORAL_TABLET | Freq: Every day | ORAL | 1 refills | Status: DC

## 2016-08-17 ENCOUNTER — Other Ambulatory Visit: Payer: Self-pay | Admitting: *Deleted

## 2016-08-17 DIAGNOSIS — I739 Peripheral vascular disease, unspecified: Secondary | ICD-10-CM

## 2016-08-17 MED ORDER — CLOPIDOGREL BISULFATE 75 MG PO TABS: 75.0000 mg | ORAL_TABLET | Freq: Every day | ORAL | 11 refills | Status: DC

## 2016-09-17 ENCOUNTER — Other Ambulatory Visit: Payer: Self-pay | Admitting: Family Medicine

## 2016-10-29 ENCOUNTER — Other Ambulatory Visit: Payer: Self-pay | Admitting: Family Medicine

## 2016-10-29 DIAGNOSIS — I739 Peripheral vascular disease, unspecified: Secondary | ICD-10-CM

## 2016-11-03 ENCOUNTER — Ambulatory Visit (INDEPENDENT_AMBULATORY_CARE_PROVIDER_SITE_OTHER): Payer: Medicaid Other | Admitting: *Deleted

## 2016-11-03 ENCOUNTER — Telehealth: Payer: Self-pay

## 2016-11-03 DIAGNOSIS — Z111 Encounter for screening for respiratory tuberculosis: Secondary | ICD-10-CM | POA: Diagnosis not present

## 2016-11-03 NOTE — Progress Notes (Signed)
Tuberculin skin test applied to left ventral forearm.  Patient informed to schedule appt for nurse visit in 48-72 hours to have site read.  Arieona Swaggerty Ann, RN   

## 2016-11-03 NOTE — Telephone Encounter (Signed)
Patient wants to make sure that information has been faxed on her TB skin test  and that the physical form for her visit is available to Dr. Nelson ChimesAmin on the 11/13/2016 at her appointment. She can reached at [phone]. She would like a call back confirming that this has been done.Glennie HawkSimpson, Michelle R

## 2016-11-05 ENCOUNTER — Ambulatory Visit (INDEPENDENT_AMBULATORY_CARE_PROVIDER_SITE_OTHER): Payer: Medicaid Other | Admitting: *Deleted

## 2016-11-05 DIAGNOSIS — Z111 Encounter for screening for respiratory tuberculosis: Secondary | ICD-10-CM

## 2016-11-05 LAB — TB SKIN TEST
INDURATION: 0 mm
TB SKIN TEST: NEGATIVE

## 2016-11-05 NOTE — Progress Notes (Signed)
PPD Reading Note PPD read and results entered in EpicCare. Result: 0 mm induration. Interpretation: negative Employer form completed and letter with results printed and given to patient.  Allergic reaction: no  Jazmin Hartsell,CMA

## 2016-11-06 NOTE — Telephone Encounter (Signed)
Our office did receive fax for PPD and work PE form.  Altamese Dilling~Vaniyah Lansky, BSN, RN-BC

## 2016-11-13 ENCOUNTER — Ambulatory Visit: Payer: Medicaid Other | Admitting: Family Medicine

## 2016-11-26 ENCOUNTER — Other Ambulatory Visit: Payer: Self-pay | Admitting: Family Medicine

## 2016-11-27 ENCOUNTER — Ambulatory Visit (INDEPENDENT_AMBULATORY_CARE_PROVIDER_SITE_OTHER): Payer: Medicaid Other | Admitting: Family Medicine

## 2016-11-27 ENCOUNTER — Encounter: Payer: Self-pay | Admitting: Family Medicine

## 2016-11-27 VITALS — BP 138/90 | HR 62 | Temp 98.1°F | Ht 66.0 in | Wt 334.0 lb

## 2016-11-27 DIAGNOSIS — I252 Old myocardial infarction: Secondary | ICD-10-CM | POA: Diagnosis not present

## 2016-11-27 DIAGNOSIS — I214 Non-ST elevation (NSTEMI) myocardial infarction: Secondary | ICD-10-CM

## 2016-11-27 DIAGNOSIS — F17200 Nicotine dependence, unspecified, uncomplicated: Secondary | ICD-10-CM

## 2016-11-27 DIAGNOSIS — M7712 Lateral epicondylitis, left elbow: Secondary | ICD-10-CM | POA: Diagnosis not present

## 2016-11-27 MED ORDER — AMLODIPINE BESYLATE 10 MG PO TABS: 10.0000 mg | ORAL_TABLET | Freq: Every day | ORAL | 1 refills | Status: DC

## 2016-11-27 NOTE — Progress Notes (Signed)
Subjective:   Patient ID: Tammy [name]    DOB: 1955-07-18, 61 y.o. female   MRN: [id number]  CC: left arm pain   HPI: Tammy [name] is a 61 y.o. female who presents to clinic today to address cardiology issues and left arm pain. Problems discussed today are as follows:  H/o MI -Reports she had a heart attack 2 years ago, has not seen anyone since discharged from hospital for this  -does not have a cardiologist but would like to see someone for follow up on this as it is concerning to her and requests referral -per chart review, NSTEMI in 2016 saw NP at [facility]  -no current chest pain, no tightness ; denies palpitations, shortness of breath  -takes daily aspirin, norvasc, metoprolol and lipitor  -doesn't get to exercise much bc of leg pain - taking Cilostazol  -Daily smoker x 30 years, 6 cigarettes a day -used to work at a daycare x 20 years , has not worked in 4 years of leg pain and can't get around much  -needs form filled out for her to start working at same daycare   Left arm pain  -x1 month duration  -pain is localized to outer elbow and surrounding muscle  -does not recall heavy lifting or trauma  -has been applying heat to it, eases some of the pain, has not taken Tylenol  -noticed that her left hand grip is a little weaker due to this  - she is right hand dominant -denies weakness, numbness and tingling   ROS: See HPI for pertinent ROS.  PMFSH: Pertinent past medical, surgical, family, and social history were reviewed and updated as appropriate. Smoking status reviewed. Social: current daily smoker  Medications reviewed.  Objective:   BP 138/90   Pulse 62   Temp 98.1 F (36.7 C) (Oral)   Ht  (1.676 m)   Wt (!) 334 lb (151.5 kg)   SpO2 96%   BMI 53.91 kg/m  Vitals and nursing note reviewed.  General: 61 yo female, NAD  Neck: supple, no JVD  CV: RRR no MRG, 2+ pedal pulses bilaterally  Lungs: CTAB, non-laboured  Abdomen: soft, NTND, +bs  MSK:   L elbow: ROM is normal, no erythema, swelling or ecchymoses noted, pain with resisted wrist extension with the elbow in full extension, sensation intact.  Skin: warm, dry, no rashes Extremities: warm and well perfused, normal tone  Assessment & Plan:   NSTEMI (non-ST elevated myocardial infarction) Per chart review, h/o NSTEMI in 2016.  Pt without CP, SOB, palpitations or other symptoms today.  No red flags on exam.  Pt is concerned that she was referred to cardiologist at the time but she never followed up.  Requesting referral today.  -Pt taking daily aspirin lipitor and metoprolol  -Cards referral placed -will continue to monitor   TOBACCO DEPENDENCE Discussed benefits of smoking cessation.  Pt is not ready to quit at this time.  -will continue to address   Lateral epicondylitis Physical exam consistent with lateral epicondylitis.  Pt reports no history of trauma or heavy lifting.  She is R hand dominant.  -Recommend resting the arm -use ice 20 min on / 20 min off rather than heat, but may continue to use heat if she prefers -Aleve BID x 7 days  -May also try IcyHot or other topical  -Anticipate this will improve over next week or so  -continue to monitor   Orders Placed This Encounter  Procedures  .  Ambulatory referral to Cardiology    Referral Priority:   Routine    Referral Type:   Consultation    Referral Reason:   Specialty Services Required    Requested Specialty:   Cardiology    Number of Visits Requested:   1   Meds ordered this encounter  Medications  . amLODipine (NORVASC) 10 MG tablet    Sig: Take 1 tablet (10 mg total) by mouth daily.    Dispense:  30 tablet    Refill:  1   Follow up: 3 months or sooner if needed   Freddrick March, MD [facility], PGY-2 12/03/2016 5:46 PM

## 2016-11-27 NOTE — Patient Instructions (Addendum)
You were seen in clinic today to discuss your cardiac issues and left arm pain.  I have sent in a referral for you and you can expect a call sometime next week to set up an appointment with a cardiologist.    For your left arm pain, I suspect this is likely a pulled muscle.  I would recommend using your right hand more as this gets better as well as using both heat and ice for pain relief.  You can also try applying Bengay or IcyHot to this.  Additionally, you can take Tylenol or Aleve but do not take more than 2 tablets a day.  You can follow up in clinic if your arm pain does not get better with trying the above.   Please call if you have any questions.   Be well, Freddrick March, MD

## 2016-12-03 DIAGNOSIS — M7710 Lateral epicondylitis, unspecified elbow: Secondary | ICD-10-CM | POA: Insufficient documentation

## 2016-12-03 NOTE — Assessment & Plan Note (Signed)
Per chart review, h/o NSTEMI in 2016.  Pt without CP, SOB, palpitations or other symptoms today.  No red flags on exam.  Pt is concerned that she was referred to cardiologist at the time but she never followed up.  Requesting referral today.  -Pt taking daily aspirin lipitor and metoprolol  -Cards referral placed -will continue to monitor

## 2016-12-03 NOTE — Assessment & Plan Note (Signed)
Discussed benefits of smoking cessation.  Pt is not ready to quit at this time.  -will continue to address

## 2016-12-03 NOTE — Assessment & Plan Note (Signed)
Physical exam consistent with lateral epicondylitis.  Pt reports no history of trauma or heavy lifting.  She is R hand dominant.  -Recommend resting the arm -use ice 20 min on / 20 min off rather than heat, but may continue to use heat if she prefers -Aleve BID x 7 days  -May also try IcyHot or other topical  -Anticipate this will improve over next week or so  -continue to monitor

## 2016-12-14 ENCOUNTER — Telehealth: Payer: Self-pay | Admitting: Cardiovascular Disease

## 2016-12-14 NOTE — Telephone Encounter (Signed)
New message    Pt is calling stating she would like to switch to the [facility] office. She states she doesn't have a preference of who she sees. Is this ok?

## 2016-12-14 NOTE — Telephone Encounter (Signed)
Yes

## 2017-01-01 ENCOUNTER — Other Ambulatory Visit: Payer: Self-pay | Admitting: Family Medicine

## 2017-01-01 DIAGNOSIS — I10 Essential (primary) hypertension: Secondary | ICD-10-CM

## 2017-01-01 NOTE — Telephone Encounter (Signed)
Pt said pharmacy has faxed us a refill request and they are waiting on dr response before they can refill it. The medication is metoprolol.

## 2017-01-04 NOTE — Telephone Encounter (Signed)
The patient has been out of her Metoprolol since Thursday and she is very upset because it has not been called in/authorized yet (she called last Friday).  Please fill/send asap, thanks.

## 2017-01-05 MED ORDER — METOPROLOL TARTRATE 100 MG PO TABS: 100.0000 mg | ORAL_TABLET | Freq: Two times a day (BID) | ORAL | 6 refills | Status: DC

## 2017-01-13 ENCOUNTER — Ambulatory Visit (INDEPENDENT_AMBULATORY_CARE_PROVIDER_SITE_OTHER): Payer: Medicaid Other | Admitting: *Deleted

## 2017-01-13 DIAGNOSIS — Z23 Encounter for immunization: Secondary | ICD-10-CM

## 2017-01-19 ENCOUNTER — Ambulatory Visit: Payer: Medicaid Other | Admitting: Cardiovascular Disease

## 2017-01-19 ENCOUNTER — Encounter: Payer: Self-pay | Admitting: Cardiovascular Disease

## 2017-01-19 VITALS — BP 140/80 | HR 60 | Ht 66.0 in | Wt 337.1 lb

## 2017-01-19 DIAGNOSIS — I2510 Atherosclerotic heart disease of native coronary artery without angina pectoris: Secondary | ICD-10-CM

## 2017-01-19 DIAGNOSIS — E782 Mixed hyperlipidemia: Secondary | ICD-10-CM

## 2017-01-19 LAB — HEPATIC FUNCTION PANEL
ALK PHOS: 126 IU/L — AB (ref 39–117)
ALT: 16 IU/L (ref 0–32)
AST: 17 IU/L (ref 0–40)
Albumin: 3.9 g/dL (ref 3.6–4.8)
BILIRUBIN TOTAL: 0.3 mg/dL (ref 0.0–1.2)
BILIRUBIN, DIRECT: 0.12 mg/dL (ref 0.00–0.40)
Total Protein: 7.2 g/dL (ref 6.0–8.5)

## 2017-01-19 LAB — BASIC METABOLIC PANEL
BUN / CREAT RATIO: 15 (ref 12–28)
BUN: 15 mg/dL (ref 8–27)
CALCIUM: 9.4 mg/dL (ref 8.7–10.3)
CO2: 25 mmol/L (ref 20–29)
CREATININE: 1.01 mg/dL — AB (ref 0.57–1.00)
Chloride: 103 mmol/L (ref 96–106)
GFR, EST AFRICAN AMERICAN: 70 mL/min/{1.73_m2} (ref >59)
GFR, EST NON AFRICAN AMERICAN: 61 mL/min/{1.73_m2} (ref >59)
Glucose: 202 mg/dL — ABNORMAL HIGH (ref 65–99)
Potassium: 3.9 mmol/L (ref 3.5–5.2)
Sodium: 140 mmol/L (ref 134–144)

## 2017-01-19 LAB — LIPID PANEL
CHOLESTEROL TOTAL: 172 mg/dL (ref 100–199)
Chol/HDL Ratio: 3.3 ratio (ref 0.0–4.4)
HDL: 52 mg/dL (ref >39)
LDL Calculated: 98 mg/dL (ref 0–99)
TRIGLYCERIDES: 108 mg/dL (ref 0–149)
VLDL Cholesterol Cal: 22 mg/dL (ref 5–40)

## 2017-01-19 MED ORDER — NITROGLYCERIN 0.4 MG SL SUBL: 0.4000 mg | SUBLINGUAL_TABLET | SUBLINGUAL | 6 refills | Status: DC | PRN

## 2017-01-19 NOTE — Progress Notes (Signed)
Cardiology Office Note:    Date:  01/19/2017   ID:  Tammy [name], DOB 1956-01-01, MRN [id number]  PCP:  Freddrick MarchAmin, Yashika, MD  Cardiologist:  Tobias AlexanderKatarina Nelson, MD, Kristeen MissPhilip Daveigh Batty, MD   Referring MD: Freddrick MarchAmin, Yashika, MD   Chief Complaint  Patient presents with  . Coronary Artery Disease  . PVD    History of Present Illness:    Tammy [name] is a 61 y.o. female with a hx of CAD, HTN, , PVD   She was admitted in May, 2016 with chest pain and positive cardiac enzymes.  She has a history of hypertension and cigarette smoking.  Troponin levels were elevated.  Peak troponin level 2.75.  Left heart catheter sedation revealed numerous mild to moderate irregularities.  Prox RCA lesion, 30% stenosed.  Mid RCA lesion, 60% stenosed.  RPDA lesion, 40% stenosed.  Post Atrio lesion, 20% stenosed.  Dist RCA lesion, 20% stenosed.  Prox Cx to Mid Cx lesion, 20% stenosed.  1st Mrg lesion, 20% stenosed.  2nd Mrg lesion, 40% stenosed.  Ost 3rd Mrg to 3rd Mrg lesion, 50% stenosed.  Ost LAD to Prox LAD lesion, 30% stenosed.  Mid LAD lesion, 30% stenosed.  Dist LAD lesion, 60% stenosed.  Is not able to walk due to leg pain when she walks .  She continues smoke .    No further chest pain    Past Medical History:  Diagnosis Date  . Abnormal mammogram 10.25.11   area of density with several adjacent amorphous calcifications located laterally w/in the right breast at approx 9-10 o'clock position likely represents area of evolving far necrosis; f/u mamo and U/S in 6 months  . CAD (coronary artery disease)    a. 07/2014 NSTEMI/Cath: LM nl, LAD 30ost/m, 60d, LCX 20p, OM1 20, OM2 40, OM3 50, RCA 30p, 1137m, 20d, RPDA 40, EF 65%-->Med Rx;  b. 07/2014 Echo: EF 55-60%, no rwma, mildly dil LA.  . Essential hypertension   . GERD (gastroesophageal reflux disease)   . Hyperlipidemia   . Morbid obesity (HCC)   . PAD (peripheral artery disease) (HCC)    a. 07/2014 ABI's: R 0.88, L 0.32.  Marland Kitchen. Ulcer      Past Surgical History:  Procedure Laterality Date  . Abdominal Aortogram N/A 02/12/2015   Performed by Nada LibmanBrabham, Vance W, [facility]  . CESAREAN SECTION    . Left Heart Cath and Coronary Angiography N/A 07/16/2014   Performed by Kathleene HazelMcAlhany, Christopher D, MD [facility]  . TRIGGER FINGER RELEASE Left    middle finger  . UPPER GASTROINTESTINAL ENDOSCOPY  1991    Current Medications: Current Meds  Medication Sig  . amLODipine (NORVASC) 10 MG tablet TAKE 1 TABLET BY MOUTH EVERY DAY  . aspirin 81 MG EC tablet Take 1 tablet (81 mg total) by mouth daily.  Marland Kitchen. atorvastatin (LIPITOR) 80 MG tablet TAKE 1 TABLET BY MOUTH EVERY DAY  . clopidogrel (PLAVIX) 75 MG tablet Take 1 tablet (75 mg total) by mouth daily.  . metoprolol tartrate (LOPRESSOR) 100 MG tablet Take 1 tablet (100 mg total) 2 (two) times daily by mouth.  . nitroGLYCERIN (NITROSTAT) 0.4 MG SL tablet Place 1 tablet (0.4 mg total) under the tongue every 5 (five) minutes as needed for chest pain.     Allergies:   Ace inhibitors and Hydrochlorothiazide   Social History   Socioeconomic History  . Marital status: Single    Spouse name: None  . Number  of children: None  . Years of education: None  . Highest education level: None  Social Needs  . Financial resource strain: None  . Food insecurity - worry: None  . Food insecurity - inability: None  . Transportation needs - medical: None  . Transportation needs - non-medical: None  Occupational History  . None  Tobacco Use  . Smoking status: Current Every Day Smoker    Packs/day: 1.00    Years: 30.00    Pack years: 30.00    Types: Cigarettes  . Smokeless tobacco: Never Used  . Tobacco comment: does not want to quit  Substance and Sexual Activity  . Alcohol use: No    Alcohol/week: 0.0 oz  . Drug use: No  . Sexual activity: Not Currently    Birth control/protection: Post-menopausal, Abstinence  Other Topics Concern  . None  Social History  Narrative  . None     Family History: The patient's family history includes Breast cancer in her maternal aunt; Cancer in her maternal aunt and mother; Diabetes in her father and mother; Hypertension in her brother, father, and sister; Ovarian cancer in her maternal aunt. ROS:   Please see the history of present illness.     All other systems reviewed and are negative.  EKGs/Labs/Other Studies Reviewed:    The following studies were reviewed today:     Recent Labs: No results found for requested labs within last 8760 hours.  Recent Lipid Panel    Component Value Date/Time   CHOL 223 (H) 07/15/2014 0600   TRIG 115 07/15/2014 0600   HDL 52 07/15/2014 0600   CHOLHDL 4.3 07/15/2014 0600   VLDL 23 07/15/2014 0600   LDLCALC 148 (H) 07/15/2014 0600    Physical Exam:    VS:  BP 140/80   Pulse 60   Ht 5\' 6"  (1.676 m)   Wt (!) 337 lb 1.9 oz (152.9 kg)   SpO2 97%   BMI 54.41 kg/m     Wt Readings from Last 3 Encounters:  01/19/17 (!) 337 lb 1.9 oz (152.9 kg)  11/27/16 (!) 334 lb (151.5 kg)  04/29/15 (!) 347 lb (157.4 kg)     GEN:  Morbidly obese female,   no acute distress HEENT: Normal NECK: No JVD; No carotid bruits LYMPHATICS: No lymphadenopathy CARDIAC: RRR, no murmurs, rubs, gallops RESPIRATORY:  Clear to auscultation without rales, wheezing or rhonchi  ABDOMEN: Soft, non-tender, non-distended MUSCULOSKELETAL:  No edema; No deformity  SKIN: Warm and dry NEUROLOGIC:  Alert and oriented x 3 PSYCHIATRIC:  Normal affect   EKG:   January 19, 2017: Sinus bradycardia at 51.  She has nonspecific ST and normalities.  ASSESSMENT:    No diagnosis found. PLAN:    In order of problems listed above:  1. Coronary artery disease: Patient presented with chest pain in May, 2016.  She had positive troponin level of 2.75.  Heart catheterization revealed mild to moderate diffuse coronary artery disease.   Prox RCA lesion, 30% stenosed.  Mid RCA lesion, 60%  stenosed.  RPDA lesion, 40% stenosed.  Post Atrio lesion, 20% stenosed.  Dist RCA lesion, 20% stenosed.  Prox Cx to Mid Cx lesion, 20% stenosed.  1st Mrg lesion, 20% stenosed.  2nd Mrg lesion, 40% stenosed.  Ost 3rd Mrg to 3rd Mrg lesion, 50% stenosed.  Ost LAD to Prox LAD lesion, 30% stenosed.  Mid LAD lesion, 30% stenosed.  Dist LAD lesion, 60% stenosed.   I have encouraged her to work on a  better diet, exercise, weight loss program.  We will check cholesterol levels today.  2.  Hyperlipidemia: Fasting lipids today. Continue atorvastatin.  3.  Morbid obesity: I have encouraged her to work out and watch her diet.   Medication Adjustments/Labs and Tests Ordered: Current medicines are reviewed at length with the patient today.  Concerns regarding medicines are outlined above.  No orders of the defined types were placed in this encounter.  No orders of the defined types were placed in this encounter.   Signed, Kristeen Miss, MD  01/19/2017 10:35 AM    [facility]

## 2017-01-19 NOTE — Patient Instructions (Signed)

## 2017-01-25 ENCOUNTER — Ambulatory Visit (INDEPENDENT_AMBULATORY_CARE_PROVIDER_SITE_OTHER): Payer: Medicaid Other | Admitting: Surgery

## 2017-01-25 ENCOUNTER — Encounter: Payer: Self-pay | Admitting: Surgery

## 2017-01-25 VITALS — BP 156/81 | HR 58 | Temp 97.9°F | Resp 22 | Ht 66.0 in | Wt 338.0 lb

## 2017-01-25 DIAGNOSIS — I70212 Atherosclerosis of native arteries of extremities with intermittent claudication, left leg: Secondary | ICD-10-CM | POA: Diagnosis not present

## 2017-01-25 DIAGNOSIS — I6529 Occlusion and stenosis of unspecified carotid artery: Secondary | ICD-10-CM | POA: Diagnosis not present

## 2017-01-25 NOTE — Progress Notes (Signed)
[facility] [place]  Patient name: Tammy [name] MRN: [id number] DOB: 06/01/1955 Sex: female   REASON FOR VISIT:    Follow up  HISOTRY OF PRESENT ILLNESS:   The patient is back for follow-up of her claudication. When I first saw her, I elected to treat her medically initially. Biggest complaint is in her left leg. She is only walking approximately 20 feet now before she gets pain. She tried cilostazol, however developed severe headaches and had to discontinue it.  Therefore, she underwent angiography on 02/12/2015 where a flush occlusion of her left superficial femoral artery was encountered.  No percutaneous intervention was performed.  She continues to have claudication but appears to be able to tolerate her severe symptoms.  She denies rest pain or ulcers.  Patient has a history of MI. She underwent cardiac catheterization. Medical therapy was recommended. The patient currently takes a statin for hypercholesterolemia. She is on multiple medications for hypertension. She continues to smoke half a pack a day but is trying to cut back. She does suffer from morbid obesity and would like to lose weight but is limited by her activity.  She is concerned about pain in her left arm that has been there for about 3 months.  It is worse in the morning.  It extends up around her elbow.  It is alleviated with warm compresses.  She has not used any anti-inflammatory medication   PAST MEDICAL HISTORY:   Past Medical History:  Diagnosis Date  . Abnormal mammogram 10.25.11   area of density with several adjacent amorphous calcifications located laterally w/in the right breast at approx 9-10 o'clock position likely represents area of evolving far necrosis; f/u mamo and U/S in 6 months  . CAD (coronary artery disease)    a. 07/2014 NSTEMI/Cath: LM nl, LAD 30ost/m, 60d, LCX 20p, OM1 20, OM2 40, OM3 50, RCA 30p, 3516m, 20d, RPDA 40, EF  65%-->Med Rx;  b. 07/2014 Echo: EF 55-60%, no rwma, mildly dil LA.  . Essential hypertension   . GERD (gastroesophageal reflux disease)   . Hyperlipidemia   . Morbid obesity (HCC)   . PAD (peripheral artery disease) (HCC)    a. 07/2014 ABI's: R 0.88, L 0.32.  Marland Kitchen. Ulcer      FAMILY HISTORY:   Family History  Problem Relation Age of Onset  . Cancer Mother        Bladder cancer, cause of death  . Diabetes Mother   . Diabetes Father   . Hypertension Father   . Hypertension Sister   . Hypertension Brother   . Cancer Maternal Aunt   . Breast cancer Maternal Aunt   . Ovarian cancer Maternal Aunt     SOCIAL HISTORY:   Social History   Tobacco Use  . Smoking status: Current Every Day Smoker    Packs/day: 1.00    Years: 30.00    Pack years: 30.00    Types: Cigarettes  . Smokeless tobacco: Never Used  . Tobacco comment: 8-10 cigarettes per day  Substance Use Topics  . Alcohol use: No    Alcohol/week: 0.0 oz     ALLERGIES:   Allergies  Allergen Reactions  . Ace Inhibitors     REACTION: rash on breast  . Hydrochlorothiazide     REACTION: "rash on breast and breast pain"     CURRENT MEDICATIONS:   Current Outpatient Medications  Medication Sig Dispense Refill  . amLODipine (NORVASC) 10 MG tablet TAKE 1 TABLET BY  MOUTH EVERY DAY 30 tablet 1  . aspirin 81 MG EC tablet Take 1 tablet (81 mg total) by mouth daily. 30 tablet 6  . atorvastatin (LIPITOR) 80 MG tablet TAKE 1 TABLET BY MOUTH EVERY DAY 30 tablet 3  . clopidogrel (PLAVIX) 75 MG tablet Take 1 tablet (75 mg total) by mouth daily. 30 tablet 11  . metoprolol tartrate (LOPRESSOR) 100 MG tablet Take 1 tablet (100 mg total) 2 (two) times daily by mouth. 60 tablet 6  . nitroGLYCERIN (NITROSTAT) 0.4 MG SL tablet Place 1 tablet (0.4 mg total) under the tongue every 5 (five) minutes as needed for chest pain. 25 tablet 6   No current facility-administered medications for this visit.     REVIEW OF SYSTEMS:   [X]  denotes  positive finding, [ ]  denotes negative finding Cardiac  Comments:  Chest pain or chest pressure:    Shortness of breath upon exertion:    Short of breath when lying flat:    Irregular heart rhythm:        Vascular    Pain in calf, thigh, or hip brought on by ambulation: x   Pain in feet at night that wakes you up from your sleep:     Blood clot in your veins:    Leg swelling:         Pulmonary    Oxygen at home:    Productive cough:     Wheezing:         Neurologic    Sudden weakness in arms or legs:     Sudden numbness in arms or legs:     Sudden onset of difficulty speaking or slurred speech:    Temporary loss of vision in one eye:     Problems with dizziness:         Gastrointestinal    Blood in stool:     Vomited blood:         Genitourinary    Burning when urinating:     Blood in urine:        Psychiatric    Major depression:         Hematologic    Bleeding problems:    Problems with blood clotting too easily:        Skin    Rashes or ulcers:        Constitutional    Fever or chills:      PHYSICAL EXAM:   Vitals:   01/25/17 1058  BP: (!) 156/81  Pulse: (!) 58  Resp: (!) 22  Temp: 97.9 F (36.6 C)  TempSrc: Oral  SpO2: 96%  Weight: (!) 338 lb (153.3 kg)  Height: 5\' 6"  (1.676 m)    GENERAL: The patient is a well-nourished female, in no acute distress. The vital signs are documented above. CARDIAC: There is a regular rate and rhythm.  VASCULAR: Palpable left radial pulse.  Nonpalpable left pedal pulse PULMONARY: Non-labored respirations MUSCULOSKELETAL: There are no major deformities or cyanosis. NEUROLOGIC: No focal weakness or paresthesias are detected. SKIN: There are no ulcers or rashes noted. PSYCHIATRIC: The patient has a normal affect.  STUDIES:   None  MEDICAL ISSUES:   Left leg claudication: The patient's symptoms remain stable.  She does not have any evidence of ulceration or rest pain.  She is not interested in surgical  intervention.  Left arm pain: I told her that this is not vascular in origin as she has a palpable left radial pulse.  Potentially this  could be tendinitis or muscle injury.  She is going to reconnect with her primary care physician for further evaluation.  She will follow-up in 1 year with ABIs and a carotid duplex.    Durene Cal, MD [facility] [phone] Pager [phone]

## 2017-01-27 NOTE — Addendum Note (Signed)
Addended by: Kailyn Dubie A on: 01/27/2017 09:36 AM   Modules accepted: Orders  

## 2017-01-28 ENCOUNTER — Telehealth: Payer: Self-pay | Admitting: Cardiovascular Disease

## 2017-01-28 ENCOUNTER — Other Ambulatory Visit: Payer: Self-pay | Admitting: Family Medicine

## 2017-01-28 NOTE — Telephone Encounter (Signed)
New message    Patient has questions about glucose lab results. Please call

## 2017-01-28 NOTE — Telephone Encounter (Signed)
Pt wants to know about the glucose levels on the last BMET lab work. Pt is aware that she needs to discuss the levels with her PCP to see about recommendations. Pt has appointment with PCP on 12/7. Labs results rout to PCP as requested per pt.

## 2017-01-28 NOTE — Telephone Encounter (Signed)
Refill sent but patient needs HTN followup given last 2 BPs are elevated

## 2017-01-29 NOTE — Telephone Encounter (Signed)
Pt informed and she stated that she has an appointment scheduled for 02/09/17. Lamonte SakaiZimmerman Rumple, April D, [place]

## 2017-02-09 ENCOUNTER — Ambulatory Visit: Payer: Medicaid Other | Admitting: Family Medicine

## 2017-04-06 ENCOUNTER — Emergency Department (HOSPITAL_COMMUNITY): Payer: Medicaid Other

## 2017-04-06 ENCOUNTER — Ambulatory Visit (INDEPENDENT_AMBULATORY_CARE_PROVIDER_SITE_OTHER): Payer: Medicaid Other | Admitting: Family Medicine

## 2017-04-06 ENCOUNTER — Encounter: Payer: Self-pay | Admitting: Family Medicine

## 2017-04-06 ENCOUNTER — Encounter (HOSPITAL_COMMUNITY): Payer: Self-pay

## 2017-04-06 ENCOUNTER — Other Ambulatory Visit: Payer: Self-pay

## 2017-04-06 VITALS — BP 200/90 | HR 64 | Temp 98.1°F | Ht 66.0 in | Wt 338.0 lb

## 2017-04-06 DIAGNOSIS — I160 Hypertensive urgency: Secondary | ICD-10-CM

## 2017-04-06 DIAGNOSIS — Z7982 Long term (current) use of aspirin: Secondary | ICD-10-CM | POA: Insufficient documentation

## 2017-04-06 DIAGNOSIS — I252 Old myocardial infarction: Secondary | ICD-10-CM | POA: Insufficient documentation

## 2017-04-06 DIAGNOSIS — F17210 Nicotine dependence, cigarettes, uncomplicated: Secondary | ICD-10-CM | POA: Diagnosis not present

## 2017-04-06 DIAGNOSIS — I2510 Atherosclerotic heart disease of native coronary artery without angina pectoris: Secondary | ICD-10-CM | POA: Insufficient documentation

## 2017-04-06 DIAGNOSIS — R51 Headache: Secondary | ICD-10-CM | POA: Diagnosis not present

## 2017-04-06 DIAGNOSIS — I10 Essential (primary) hypertension: Secondary | ICD-10-CM | POA: Insufficient documentation

## 2017-04-06 DIAGNOSIS — Z7902 Long term (current) use of antithrombotics/antiplatelets: Secondary | ICD-10-CM | POA: Diagnosis not present

## 2017-04-06 LAB — CBC
HEMATOCRIT: 37.3 % (ref 36.0–46.0)
HEMOGLOBIN: 12.4 g/dL (ref 12.0–15.0)
MCH: 27.3 pg (ref 26.0–34.0)
MCHC: 33.2 g/dL (ref 30.0–36.0)
MCV: 82 fL (ref 78.0–100.0)
Platelets: 200 10*3/uL (ref 150–400)
RBC: 4.55 MIL/uL (ref 3.87–5.11)
RDW: 13.4 % (ref 11.5–15.5)
WBC: 5.2 10*3/uL (ref 4.0–10.5)

## 2017-04-06 LAB — I-STAT TROPONIN, ED: Troponin i, poc: 0.01 ng/mL (ref 0.00–0.08)

## 2017-04-06 LAB — BASIC METABOLIC PANEL
ANION GAP: 10 (ref 5–15)
BUN: 10 mg/dL (ref 6–20)
CHLORIDE: 105 mmol/L (ref 101–111)
CO2: 23 mmol/L (ref 22–32)
CREATININE: 0.93 mg/dL (ref 0.44–1.00)
Calcium: 9.3 mg/dL (ref 8.9–10.3)
GFR calc Af Amer: >60 mL/min (ref >60)
GFR calc non Af Amer: >60 mL/min (ref >60)
Glucose, Bld: 150 mg/dL — ABNORMAL HIGH (ref 65–99)
POTASSIUM: 3.6 mmol/L (ref 3.5–5.1)
Sodium: 138 mmol/L (ref 135–145)

## 2017-04-06 MED ORDER — ACETAMINOPHEN 500 MG PO TABS
1000.0000 mg | ORAL_TABLET | Freq: Once | ORAL | Status: AC
  Administered 2017-04-06: 1000 mg via ORAL

## 2017-04-06 MED ORDER — ACETAMINOPHEN 325 MG PO TABS: 1000.0000 mg | ORAL_TABLET | Freq: Once | ORAL | Status: AC

## 2017-04-06 NOTE — Progress Notes (Signed)
    Subjective:  Tammy [name] is a 62 y.o. female [facility] with a chief complaint of headache with elevated blood pressure.   HPI:  Headache elevated blood pressure and history of HTN Went to dentist for some fillings and BP was 228/90 and came here and had BP 220/110. Also endorsing headache without changes in vision. Rates headache 5/10 and is diffuse. Denies any CP, SOB, numbness or tingling in extremities or weakness. No LE swelling, redness or pain. Takes amlodipine 10mg  daily, but has not taken it for a month or two. She saw something about it promoting cancer in women. Pt has history of CAD without stent placement, hyperlipidemia, ASA 81mg .   PMH: HTN,  Tobacco use: 5 cigs/day Medication: reviewed and updated ROS: see HPI   Objective:  Physical Exam: BP (!) 200/90   Pulse 64   Temp 98.1 F (36.7 C) (Oral)   Ht 5\' 6"  (1.676 m)   Wt (!) 338 lb (153.3 kg)   SpO2 97%   BMI 54.55 kg/m   Gen: 62 year old female appearing uncomfortable but in no acute distress CV: RRR with no murmurs appreciated, no carotid bruits Pulm: NWOB, CTAB with no crackles, wheezes, or rhonchi GI: Normal bowel sounds present. Soft, Nontender, Nondistended. MSK: no edema, cyanosis, or clubbing noted Skin: warm, dry Neuro: CN II through XII within normal limits, sensation intact, strength 5 out of 5 throughout, no other focal neurological deficits Psych: Normal affect and thought content  No results found for this or any previous visit (from the past 72 hour(s)).   Assessment/Plan:  Hypertensive urgency  Patient presented with 1 day of headache found to be hypertensive with systolic to the 230s at dentist office down to 210 at our office.  Headache rated 5 out of 10 with no changes in vision or focal neurological deficits.  Does not describe this as "worst headache of my life" and appears rather comfortable on exam.  Patient has not been taking her blood pressure medication for  at least the past couple of months.  Discussed options with patient including starting back on her blood pressure medication and following up in the next few days for blood pressure check and labs to rule out endorgan damage versus going to the emergency department.  Patient opted for emergency department.  Patient received 1 g of Tylenol.  We had plans to transport patient via wheelchair, however she insisted that she had to drive her sister home and would go to the emergency department on her own.  Discussed risks with patient and she understood.  Discussed strict return precautions including weakness, numbness, changes in vision or speech, shortness of breath and/or chest pain.   Buddy Loeffelholz L. Myrtie SomanWarden, MD [facility] Resident PGY-2 04/07/2017 10:07 AM

## 2017-04-06 NOTE — Progress Notes (Signed)
Patient walk-in to clinic due to BP being elevated at dentist visit she just left. Stated BP was "229 something".  Patient denies chest pain, SOB, dizziness, or headache. States did have HA last night but Tylenol took it away. Patient took Metoprolol at 0830 this morning but states STOPPED taking Amlodipine about 2 months ago due to a news story about it causing cancer. Patient has an appt with PCP this Friday. This nurse took patient's vitals and BP was 220/110 and HR was 58. Same day appt made for today with Dr. Myrtie SomanWarden. Ples SpecterAlisa Manuella Blackson, RN [facility] [facility] RN)

## 2017-04-06 NOTE — ED Triage Notes (Signed)
Onset today pt went to dentist but was not able to get procedure done d/t BP 228/90.  Pt then went to PCP for eval.  Pt c/o onset last night headache.  Tylenol was given to pt at office today.  Pt stopped taking Amlodipine about 2 months ago d/t new story of it causing cancer.  Took Metoprolol this morning.  Denies chest pain, shortness of breath.

## 2017-04-07 ENCOUNTER — Emergency Department (HOSPITAL_COMMUNITY)
Admission: EM | Admit: 2017-04-07 | Discharge: 2017-04-07 | Disposition: A | Discharge status: Home / Self Care | Payer: Medicaid Other | Attending: Emergency Medicine | Admitting: Emergency Medicine

## 2017-04-07 ENCOUNTER — Encounter: Payer: Self-pay | Admitting: Family Medicine

## 2017-04-07 ENCOUNTER — Emergency Department (HOSPITAL_COMMUNITY): Payer: Medicaid Other

## 2017-04-07 ENCOUNTER — Other Ambulatory Visit: Payer: Self-pay

## 2017-04-07 ENCOUNTER — Encounter (HOSPITAL_COMMUNITY): Payer: Self-pay | Admitting: Emergency Medicine

## 2017-04-07 DIAGNOSIS — I10 Essential (primary) hypertension: Secondary | ICD-10-CM

## 2017-04-07 LAB — I-STAT TROPONIN, ED: Troponin i, poc: 0 ng/mL (ref 0.00–0.08)

## 2017-04-07 MED ORDER — ACETAMINOPHEN 500 MG PO TABS
1000.0000 mg | ORAL_TABLET | Freq: Once | ORAL | Status: AC
  Administered 2017-04-07: 1000 mg via ORAL
  Filled 2017-04-07: qty 2

## 2017-04-07 MED ORDER — KETOROLAC TROMETHAMINE 30 MG/ML IJ SOLN
30.0000 mg | Freq: Once | INTRAMUSCULAR | Status: AC
  Administered 2017-04-07: 30 mg via INTRAMUSCULAR
  Filled 2017-04-07: qty 1

## 2017-04-07 NOTE — ED Notes (Signed)
Pt able to tolerate fluids with no signs of nausea or vomiting.

## 2017-04-07 NOTE — ED Notes (Signed)
Pt ambulated from room B14 down the hall and around the nurses station in pod a and back to room. Pt states she feels better. Pt denies any sob or pain at this time.

## 2017-04-07 NOTE — ED Notes (Signed)
Patient transported to CT 

## 2017-04-07 NOTE — ED Provider Notes (Signed)
[facility] EMERGENCY DEPARTMENT Provider Note   CSN: [id number] Arrival date & time: 04/06/17  1634     History   Chief Complaint Chief Complaint  Patient presents with  . Hypertension    HPI Tammy [name] is a 62 y.o. female.  The history is provided by the patient.  Hypertension  This is a chronic problem. The current episode started more than 1 week ago. The problem occurs constantly. The problem has not changed since onset.Associated symptoms include headaches. Pertinent negatives include no chest pain, no abdominal pain and no shortness of breath. Nothing aggravates the symptoms. Nothing relieves the symptoms. She has tried nothing for the symptoms. The treatment provided no relief.  Not taking her BP meds as she is concerned about the risk of cancer.  Frontal headache x 1 day.  No f/c/r.  No weakness nor numbness no changes in vision or speech.  No n/v.  Past Medical History:  Diagnosis Date  . Abnormal mammogram 10.25.11   area of density with several adjacent amorphous calcifications located laterally w/in the right breast at approx 9-10 o'clock position likely represents area of evolving far necrosis; f/u mamo and U/S in 6 months  . CAD (coronary artery disease)    a. 07/2014 NSTEMI/Cath: LM nl, LAD 30ost/m, 60d, LCX 20p, OM1 20, OM2 40, OM3 50, RCA 30p, 22m, 20d, RPDA 40, EF 65%-->Med Rx;  b. 07/2014 Echo: EF 55-60%, no rwma, mildly dil LA.  . Essential hypertension   . GERD (gastroesophageal reflux disease)   . Hyperlipidemia   . Morbid obesity (HCC)   . PAD (peripheral artery disease) (HCC)    a. 07/2014 ABI's: R 0.88, L 0.32.  Marland Kitchen Ulcer     Patient Active Problem List   Diagnosis Date Noted  . Lateral epicondylitis 12/03/2016  . Right ankle pain 04/29/2015  . Healthcare maintenance 04/29/2015  . CAD (coronary artery disease)   . PAD (peripheral artery disease) (HCC) 07/20/2014  . HLD (hyperlipidemia)   . NSTEMI (non-ST elevated myocardial  infarction) (HCC) 07/15/2014  . Essential hypertension   . Morbid obesity (HCC)   . Angina pectoris (HCC)   . Low back pain 07/10/2010  . TOBACCO DEPENDENCE 04/29/2006  . HYPERTENSION, BENIGN SYSTEMIC 04/29/2006    Past Surgical History:  Procedure Laterality Date  . CARDIAC CATHETERIZATION N/A 07/16/2014   Procedure: Left Heart Cath and Coronary Angiography;  Surgeon: Kathleene Hazel, MD;  Location: [facility] INVASIVE CV LAB;  Service: Cardiovascular;  Laterality: N/A;  . CESAREAN SECTION    . PERIPHERAL VASCULAR CATHETERIZATION N/A 02/12/2015   Procedure: Abdominal Aortogram;  Surgeon: Nada Libman, MD;  Location: [facility] INVASIVE CV LAB;  Service: Cardiovascular;  Laterality: N/A;  . TRIGGER FINGER RELEASE Left    middle finger  . UPPER GASTROINTESTINAL ENDOSCOPY  1991    OB History    No data available       Home Medications    Prior to Admission medications   Medication Sig Start Date End Date Taking? Authorizing Provider  aspirin 81 MG EC tablet Take 1 tablet (81 mg total) by mouth daily. 07/20/14  Yes Bacigalupo, Marzella Schlein, MD  atorvastatin (LIPITOR) 80 MG tablet TAKE 1 TABLET BY MOUTH EVERY DAY Patient taking differently: TAKE 80 mg TABLET BY MOUTH EVERY DAY 10/29/16  Yes Freddrick March, MD  clopidogrel (PLAVIX) 75 MG tablet Take 1 tablet (75 mg total) by mouth daily. 08/17/16  Yes Bacigalupo, Marzella Schlein, MD  metoprolol tartrate (LOPRESSOR) 100  MG tablet Take 1 tablet (100 mg total) 2 (two) times daily by mouth. 01/05/17  Yes Freddrick March, MD  nitroGLYCERIN (NITROSTAT) 0.4 MG SL tablet Place 1 tablet (0.4 mg total) under the tongue every 5 (five) minutes as needed for chest pain. 01/19/17  Yes Nahser, Deloris Ping, MD  amLODipine (NORVASC) 10 MG tablet TAKE 1 TABLET BY MOUTH EVERY DAY Patient not taking: Reported on 04/06/2017 01/28/17   Leland Her, DO    Family History Family History  Problem Relation Age of Onset  . Cancer Mother        Bladder cancer, cause of death  .  Diabetes Mother   . Diabetes Father   . Hypertension Father   . Hypertension Sister   . Hypertension Brother   . Cancer Maternal Aunt   . Breast cancer Maternal Aunt   . Ovarian cancer Maternal Aunt     Social History Social History   Tobacco Use  . Smoking status: Current Every Day Smoker    Packs/day: 0.25    Years: 30.00    Pack years: 7.50    Types: Cigarettes  . Smokeless tobacco: Never Used  . Tobacco comment: 8-10 cigarettes per day  Substance Use Topics  . Alcohol use: No    Alcohol/week: 0.0 oz  . Drug use: No     Allergies   Ace inhibitors and Hydrochlorothiazide   Review of Systems Review of Systems  Constitutional: Negative for diaphoresis, fatigue and fever.  HENT: Negative for trouble swallowing.   Eyes: Negative for photophobia and visual disturbance.  Respiratory: Negative for shortness of breath.   Cardiovascular: Negative for chest pain and leg swelling.  Gastrointestinal: Negative for abdominal pain.  Musculoskeletal: Negative for neck pain and neck stiffness.  Neurological: Positive for headaches. Negative for dizziness, tremors, seizures, syncope, facial asymmetry, speech difficulty, weakness, light-headedness and numbness.  All other systems reviewed and are negative.    Physical Exam Updated Vital Signs BP (!) 182/75   Pulse 60   Temp 98.3 F (36.8 C) (Oral)   Resp 18   Ht 5\' 6"  (1.676 m)   Wt (!) 153.3 kg (338 lb)   SpO2 97%   BMI 54.55 kg/m   Physical Exam  Constitutional: She is oriented to person, place, and time. She appears well-developed and well-nourished. No distress.  HENT:  Head: Normocephalic and atraumatic.  Right Ear: External ear normal.  Left Ear: External ear normal.  Nose: Nose normal.  Mouth/Throat: Oropharynx is clear and moist. No oropharyngeal exudate.  Eyes: Conjunctivae and EOM are normal. Pupils are equal, round, and reactive to light.  Neck: Normal range of motion. Neck supple.  Cardiovascular:  Normal rate, regular rhythm, normal heart sounds and intact distal pulses.  Pulmonary/Chest: Effort normal and breath sounds normal. No stridor. She has no wheezes. She has no rales.  Abdominal: Soft. Bowel sounds are normal. She exhibits no mass. There is no tenderness. There is no rebound and no guarding.  Musculoskeletal: Normal range of motion.  Neurological: She is alert and oriented to person, place, and time. She displays normal reflexes. No cranial nerve deficit. Coordination normal.  Skin: Skin is warm and dry. Capillary refill takes less than 2 seconds.  Psychiatric: She has a normal mood and affect.  Nursing note and vitals reviewed.    ED Treatments / Results  Labs (all labs ordered are listed, but only abnormal results are displayed)  Results for orders placed or performed during the hospital encounter of 04/07/17  Basic metabolic panel  Result Value Ref Range   Sodium 138 135 - 145 mmol/L   Potassium 3.6 3.5 - 5.1 mmol/L   Chloride 105 101 - 111 mmol/L   CO2 23 22 - 32 mmol/L   Glucose, Bld 150 (H) 65 - 99 mg/dL   BUN 10 6 - 20 mg/dL   Creatinine, Ser 8.11 0.44 - 1.00 mg/dL   Calcium 9.3 8.9 - 91.4 mg/dL   GFR calc non Af Amer >60 >60 mL/min   GFR calc Af Amer >60 >60 mL/min   Anion gap 10 5 - 15  CBC  Result Value Ref Range   WBC 5.2 4.0 - 10.5 K/uL   RBC 4.55 3.87 - 5.11 MIL/uL   Hemoglobin 12.4 12.0 - 15.0 g/dL   HCT 78.2 95.6 - 21.3 %   MCV 82.0 78.0 - 100.0 fL   MCH 27.3 26.0 - 34.0 pg   MCHC 33.2 30.0 - 36.0 g/dL   RDW 08.6 57.8 - 46.9 %   Platelets 200 150 - 400 K/uL  I-stat troponin, ED  Result Value Ref Range   Troponin i, poc 0.01 0.00 - 0.08 ng/mL   Comment 3           Dg Chest 2 View  Result Date: 04/06/2017 CLINICAL DATA:  Hypertension. EXAM: CHEST  2 VIEW COMPARISON:  Radiographs of Jul 14, 2014. FINDINGS: The heart size and mediastinal contours are within normal limits. Both lungs are clear. No pneumothorax or pleural effusion is noted. The  visualized skeletal structures are unremarkable. IMPRESSION: No active cardiopulmonary disease. Electronically Signed   By: Lupita Raider, M.D.   On: 04/06/2017 17:34   Ct Head Wo Contrast  Result Date: 04/07/2017 CLINICAL DATA:  Acute onset of frontal and posterior headache. High blood pressure. EXAM: CT HEAD WITHOUT CONTRAST TECHNIQUE: Contiguous axial images were obtained from the base of the skull through the vertex without intravenous contrast. COMPARISON:  CT of the head performed 12/27/2006 FINDINGS: Brain: No evidence of acute infarction, hemorrhage, hydrocephalus, extra-axial collection or mass lesion / mass effect. Scattered periventricular and subcortical white matter change likely reflects small vessel ischemic microangiopathy, new from 2008. The brainstem and fourth ventricle are within normal limits. The basal ganglia are unremarkable in appearance. The cerebral hemispheres demonstrate grossly normal gray-white differentiation. No mass effect or midline shift is seen. Vascular: No hyperdense vessel or unexpected calcification. Skull: There is no evidence of fracture; visualized osseous structures are unremarkable in appearance. Sinuses/Orbits: The orbits are within normal limits. The paranasal sinuses and mastoid air cells are well-aerated. Other: No significant soft tissue abnormalities are seen. IMPRESSION: 1. No acute intracranial pathology seen on CT. 2. Scattered small vessel ischemic microangiopathy, new from 2008. Electronically Signed   By: Roanna Raider M.D.   On: 04/07/2017 04:03    EKG  EKG Interpretation  Date/Time:  Tuesday April 06 2017 16:47:35 EST Ventricular Rate:  60 PR Interval:  148 QRS Duration: 80 QT Interval:  422 QTC Calculation: 422 R Axis:   57 Text Interpretation:  Sinus rhythm with occasional Premature ventricular complexes Confirmed by Nicanor Alcon, Adiah Guereca ([id number]) on 04/07/2017 3:17:30 AM       Radiology Dg Chest 2 View  Result Date: 04/06/2017 CLINICAL  DATA:  Hypertension. EXAM: CHEST  2 VIEW COMPARISON:  Radiographs of Jul 14, 2014. FINDINGS: The heart size and mediastinal contours are within normal limits. Both lungs are clear. No pneumothorax or pleural effusion is noted. The visualized skeletal structures are  unremarkable. IMPRESSION: No active cardiopulmonary disease. Electronically Signed   By: Lupita RaiderJames  Green Jr, M.D.   On: 04/06/2017 17:34    Procedures Procedures (including critical care time)  Medications Ordered in ED  Medications  ketorolac (TORADOL) 30 MG/ML injection 30 mg (not administered)  acetaminophen (TYLENOL) tablet 1,000 mg (not administered)   Ruled out for MI in the ED.  HA is better with improved BP, extremely well appearing   Patient will need to restart her BP medication and follow up with her PMD.  She understands this  Final Clinical Impressions(s) / ED Diagnoses   Return for weakness, numbness, changes in vision or speech,  fevers > 100.4 unrelieved by medication, shortness of breath, intractable vomiting, or diarrhea, abdominal pain, Inability to tolerate liquids or food, cough, altered mental status or any concerns. No signs of systemic illness or infection. The patient is nontoxic-appearing on exam and vital signs are within normal limits.    I have reviewed the triage vital signs and the nursing notes. Pertinent labs &imaging results that were available during my care of the patient were reviewed by me and considered in my medical decision making (see chart for details).  After history, exam, and medical workup I feel the patient has been appropriately medically screened and is safe for discharge home. Pertinent diagnoses were discussed with the patient. Patient was given return precautions.   Usiel Astarita, MD 04/07/17 [phone]

## 2017-04-09 ENCOUNTER — Ambulatory Visit (INDEPENDENT_AMBULATORY_CARE_PROVIDER_SITE_OTHER): Payer: Medicaid Other | Admitting: Family Medicine

## 2017-04-09 ENCOUNTER — Other Ambulatory Visit: Payer: Self-pay | Admitting: Family Medicine

## 2017-04-09 ENCOUNTER — Telehealth: Payer: Self-pay | Admitting: Family Medicine

## 2017-04-09 ENCOUNTER — Other Ambulatory Visit: Payer: Self-pay

## 2017-04-09 ENCOUNTER — Encounter: Payer: Self-pay | Admitting: Family Medicine

## 2017-04-09 DIAGNOSIS — I10 Essential (primary) hypertension: Secondary | ICD-10-CM

## 2017-04-09 MED ORDER — INDAPAMIDE 1.25 MG PO TABS: 1.2500 mg | ORAL_TABLET | Freq: Every day | ORAL | 0 refills | Status: DC

## 2017-04-09 NOTE — Telephone Encounter (Signed)
I told her to take Ibuprofen 400 mg.  She has 200 mg at home so she needs to take 2 capsules of these. Thanks

## 2017-04-09 NOTE — Telephone Encounter (Signed)
Pt called because she was seen today and told by the doctor to tale 800 mg of ibuprofen, She said that they do not have this in the stores. The pharmacy told her that she needs a prescription for 800 mg of ibuprofen. Can we call this in for her. jw

## 2017-04-09 NOTE — Telephone Encounter (Signed)
Pt informed. Daimien Patmon, CMA  

## 2017-04-09 NOTE — Patient Instructions (Addendum)
You were seen in clinic for follow-up after your visit to the ER.  Your blood pressure in clinic today was 175/90.  As we discussed, your goal blood pressure should be about 140/90.  I am discontinuing your amlodipine as you do not take this medication.  I would like you to continue to take Lopressor 100 mg daily.  In addition, I am adding a new blood pressure medication to your regimen: Indapamide 1.25 mg.   I have sent this into your pharmacy and you can pick it up today.  Please take this daily along with your other blood pressure medications.  We discussed smoking which can also contribute to your blood pressures.  If you feel like you would like to be put in touch with behavioral health to discuss goal setting or quitting, please let me know at a future visit.  Follow-up in 1 week for blood pressure check.  Please call clinic if you have any questions.    Be well, Freddrick MarchYashika Naimah Yingst, MD

## 2017-04-14 NOTE — Assessment & Plan Note (Addendum)
BP remains elevated at today's visit: 175/90 and complaining of headache.  No blurry vision and otherwise asymptomatic.  Long discussion had with patient regarding medication compliance.    Patient is very paranoid about blood pressure medications causing breast cancer in women.  Reports she is only taking Metoprolol.  Cannot increase dose as her HR is 59 today in office.  She refuses starting ACE inhibitors or HCTZ as this has caused a breast rash previously and she feels the medication causes cancer.  Not compliant with her amlodipine 2/2 concern for cancer as well.   -Discussed with patient that something will need to be started today to better control her BP -Continue Metoprolol at current dose  -Rx: Indapamide; will start 1.25 mg daily  -Advised limiting salt in diet  -Offered smoking cessation resources; pt declined -Follow up in 1 week for blood pressure check

## 2017-04-14 NOTE — Progress Notes (Signed)
   Subjective:   Patient ID: Tammy [name]    DOB: [birth year], 62 y.o. female   MRN: [id number]  CC: f/u ED visit, HTN, headache   HPI: Tammy [name] is a 62 y.o. female who presents to clinic today for the following issue.  HTN, headache Pt presents after visit to ED on 04/07/2017 for elevated blood pressures.  She states she is still having headaches.  No blurry vision but her eyes feel sensitive to light.  Per ED notes, she had not been compliant with blood pressure medications and told to follow up with PCP.  She takes Metoprolol only and has not been taking Norvasc.  She is concerned this medication is associated with cancer in women.  No CP, palpitations, SOB or leg swelling.  She is a current smoker and smokes about 5-6 cigarettes a day.  She has been smoking x30 years and has recently cut down.  Smokes as a way to deal with stress in her life and does not want to discuss quitting.   ROS: Denies fevers, chills, nausea, vomiting. Denies dizziness, shortness of breath, palpitations, blurry vision.  +headache.   Social: current daily tobacco use  Medications reviewed. Objective:   BP (!) 162/90   Pulse (!) 59   Temp 98.1 F (36.7 C) (Oral)   Wt (!) 333 lb (151 kg)   SpO2 97%   BMI 53.75 kg/m  Vitals and nursing note reviewed.  General: 62 yo AA F, NAD  HEENT:NCAT, EOMI, PERRL, MMM, o/p clear  Neck: supple, normal ROM  CV: regular rate and rhythm without murmurs rubs or gallops Lungs: clear to auscultation bilaterally with normal work of breathing Skin: warm, dry, no rash Extremities: warm and well perfused Neuro: alert, no focal deficits, motor strength 5/5 bilaterally in upper ans lower extremities, normal sensation   Assessment & Plan:   HYPERTENSION, BENIGN SYSTEMIC BP remains elevated at today's visit: 175/90 and complaining of headache.  No blurry vision and otherwise asymptomatic.  Long discussion had with patient regarding medication compliance.    Patient is very  paranoid about blood pressure medications causing breast cancer in women.  Reports she is only taking Metoprolol.  Cannot increase dose as her HR is 59 today in office.  She refuses starting ACE inhibitors or HCTZ as this has caused a breast rash previously and she feels the medication causes cancer.  Not compliant with her amlodipine 2/2 concern for cancer as well.   -Discussed with patient that something will need to be started today to better control her BP -Continue Metoprolol at current dose  -Rx: Indapamide; will start 1.25 mg daily  -Advised limiting salt in diet  -Offered smoking cessation resources; pt declined -Follow up in 1 week for blood pressure check   Meds ordered this encounter  Medications  . indapamide (LOZOL) 1.25 MG tablet    Sig: Take 1 tablet (1.25 mg total) by mouth daily.    Dispense:  30 tablet    Refill:  0   Follow up: 7-10 days to recheck BP   [name], MD [facility], PGY-2 04/14/2017 8:05 PM

## 2017-04-23 ENCOUNTER — Other Ambulatory Visit: Payer: Self-pay

## 2017-04-23 ENCOUNTER — Ambulatory Visit (INDEPENDENT_AMBULATORY_CARE_PROVIDER_SITE_OTHER): Payer: Medicaid Other | Admitting: Family Medicine

## 2017-04-23 VITALS — BP 140/81 | HR 60 | Temp 97.5°F | Ht 66.0 in | Wt 332.8 lb

## 2017-04-23 DIAGNOSIS — H539 Unspecified visual disturbance: Secondary | ICD-10-CM

## 2017-04-23 DIAGNOSIS — R51 Headache: Secondary | ICD-10-CM

## 2017-04-23 DIAGNOSIS — R519 Headache, unspecified: Secondary | ICD-10-CM

## 2017-04-23 DIAGNOSIS — I10 Essential (primary) hypertension: Secondary | ICD-10-CM

## 2017-04-23 DIAGNOSIS — H538 Other visual disturbances: Secondary | ICD-10-CM

## 2017-04-23 MED ORDER — INDAPAMIDE 2.5 MG PO TABS: 1.2500 mg | ORAL_TABLET | Freq: Every day | ORAL | 0 refills | Status: DC

## 2017-04-23 NOTE — Patient Instructions (Addendum)
You were seen in clinic today for follow-up on your hypertension.  Your blood pressure was slightly improved today, however it does need to be much lower.  As we discussed, I am increasing the dose of your Indapamide to 2.5 mg daily.  Please start taking this tomorrow.  I would like for you to follow-up in 2 weeks to recheck your blood pressure.   Additionally, I have sent a referral to ophthalmology for you.  With your history of high blood pressure and not having checked your eyes in many years, I am concerned that you may have some vision changes due to uncontrolled blood pressure.  You can expect a call within a week or so regarding scheduling this appointment.  Please call clinic if you have any questions.  Be well, Freddrick MarchYashika Everlene Cunning, MD

## 2017-04-23 NOTE — Progress Notes (Signed)
   Subjective:   Patient ID: Tammy [name]    DOB: [birth year], 62 y.o. female   MRN: [id number]  CC: f/u  HTN, headache   HPI: Tammy [name] is a 62 y.o. female who presents to clinic today for follow-up of hypertension and headache.  Hypertension Seen on February 13 with elevated blood pressure in clinic.  At that time, she was advised to continue metoprolol and Indapamide 1.25 mg daily was started.  She states she has been taking this as prescribed and reports good compliance.  She has been watching her salt intake.  Denies chest pain, blurry vision, shortness of breath, dizziness, headache, leg swelling.   Headache Patient reports a frontal headache that comes and goes.  Previous headache has resolved and this is more dull in nature.  She rates as 2-3 out of 10 in severity.  She feels she may need to have her eyes checked as she has not seen an eye doctor in over 20 years.  Requesting referral today.  ROS: Denies chest pain, shortness of breath, palpitations, leg swelling.  Denies headache, dizziness, lightheadedness.  No blurry vision.  Social: Patient is a current daily smoker Medications reviewed. Objective:   BP 140/81   Pulse 60   Temp (!) 97.5 F (36.4 C) (Oral)   Ht 5\' 6"  (1.676 m)   Wt (!) 332 lb 12.8 oz (151 kg)   SpO2 97%   BMI 53.72 kg/m  Vitals and nursing note reviewed.  General: 62 yo F, NAD  HEENT:NCAT, EOMI, PERRL, MMM, o/p clear  Neck: supple, no LAD CV: RRR no MRG Lungs: CTA B, normal effort Skin: warm, dry, no rash Extremities: warm and well perfused Neuro: alert, no focal deficits, motor strength 5/5 bilaterally in upper ans lower extremities, normal sensation   Assessment & Plan:   HYPERTENSION, BENIGN SYSTEMIC Blood pressure at visit today: 170/85.  Headaches have resolved and she is otherwise asymptomatic.  Reports good compliance with indapamide 1.25 mg daily.   -Plan to increase indapamide to 2.25 mg daily. -Given history of uncontrolled  hypertension, would recommend eye exam to evaluate for papilledema - Referral to ophthalmology placed -Follow-up 2 weeks for BP check  Headache Reports new vision changes, mainly with near sight. Suspect headaches are secondary to elevated pressures vs eye strain.  Pt states she is wearing reading glasses 3.75x and has not seen an eye doctor over 20 years and may require prescription reading glasses.   -Referral to ophthalmology placed  Orders Placed This Encounter  Procedures  . Ambulatory referral to Ophthalmology    Referral Priority:   Routine    Referral Type:   Consultation    Referral Reason:   Specialty Services Required    Requested Specialty:   Ophthalmology    Number of Visits Requested:   1   Meds ordered this encounter  Medications  . indapamide (LOZOL) 2.5 MG tablet    Sig: Take 0.5 tablets (1.25 mg total) by mouth daily.    Dispense:  30 tablet    Refill:  0   Follow up: 2 weeks   Freddrick MarchYashika Aunya Lemler, MD [facility], PGY-2 04/28/2017 8:20 PM

## 2017-04-26 ENCOUNTER — Telehealth: Payer: Self-pay | Admitting: Family Medicine

## 2017-04-26 ENCOUNTER — Other Ambulatory Visit: Payer: Self-pay | Admitting: *Deleted

## 2017-04-26 NOTE — Telephone Encounter (Signed)
Pt was told by pharmacy that the Rx indapamide was written incorrectly. Please correct.  Pt wants to know if she can continue to take 1.5 until the Rx is corrected. Please advise

## 2017-04-28 ENCOUNTER — Encounter: Payer: Self-pay | Admitting: Family Medicine

## 2017-04-28 ENCOUNTER — Other Ambulatory Visit: Payer: Self-pay | Admitting: Family Medicine

## 2017-04-28 DIAGNOSIS — R51 Headache: Secondary | ICD-10-CM

## 2017-04-28 DIAGNOSIS — R519 Headache, unspecified: Secondary | ICD-10-CM | POA: Insufficient documentation

## 2017-04-28 NOTE — Assessment & Plan Note (Addendum)
Blood pressure at visit today: 170/85.  Headaches have resolved and she is otherwise asymptomatic.  Reports good compliance with indapamide 1.25 mg daily.   -Plan to increase indapamide to 2.25 mg daily. -Given history of uncontrolled hypertension, would recommend eye exam to evaluate for papilledema - Referral to ophthalmology placed -Follow-up 2 weeks for BP check

## 2017-04-28 NOTE — Assessment & Plan Note (Signed)
Reports new vision changes, mainly with near sight. Suspect headaches are secondary to elevated pressures vs eye strain.  Pt states she is wearing reading glasses 3.75x and has not seen an eye doctor over 20 years and may require prescription reading glasses.   -Referral to ophthalmology placed

## 2017-04-28 NOTE — Telephone Encounter (Signed)
Have called pharmacy to correct this. Please inform pt, thanks

## 2017-04-29 NOTE — Telephone Encounter (Signed)
LMOVM informing pt of correction. Zamariya Neal Bruna PotterBlount, CMA

## 2017-05-27 ENCOUNTER — Other Ambulatory Visit: Payer: Self-pay | Admitting: Family Medicine

## 2017-05-27 DIAGNOSIS — H2513 Age-related nuclear cataract, bilateral: Secondary | ICD-10-CM | POA: Diagnosis not present

## 2017-05-27 DIAGNOSIS — H35033 Hypertensive retinopathy, bilateral: Secondary | ICD-10-CM | POA: Diagnosis not present

## 2017-05-27 MED ORDER — INDAPAMIDE 2.5 MG PO TABS: 1.2500 mg | ORAL_TABLET | Freq: Every day | ORAL | 0 refills | Status: DC

## 2017-05-27 NOTE — Telephone Encounter (Signed)
Pt needs a refill on her indapamide sent to the [facility] on [address]. She only has 1 pill left of it. She has a f/u appt with Amin for her bp on April 10th, which was Amin's first available. Please advise

## 2017-05-31 ENCOUNTER — Other Ambulatory Visit: Payer: Self-pay | Admitting: Family Medicine

## 2017-05-31 DIAGNOSIS — I739 Peripheral vascular disease, unspecified: Secondary | ICD-10-CM

## 2017-06-09 ENCOUNTER — Ambulatory Visit (INDEPENDENT_AMBULATORY_CARE_PROVIDER_SITE_OTHER): Payer: Medicaid Other | Admitting: Family Medicine

## 2017-06-09 ENCOUNTER — Encounter: Payer: Self-pay | Admitting: Family Medicine

## 2017-06-09 ENCOUNTER — Other Ambulatory Visit: Payer: Self-pay

## 2017-06-09 DIAGNOSIS — I10 Essential (primary) hypertension: Secondary | ICD-10-CM

## 2017-06-09 NOTE — Progress Notes (Addendum)
   Subjective:   Patient ID: Tammy [name]    DOB: January 07, 1956, 62 y.o. female   MRN: [id number]  CC: f/u blood pressure   HPI: Tammy [name] is a 62 y.o. female who presents to clinic today to f/u blood pressure.  Hypertension Patient reports good compliance with Indapamide 2.25 mg daily.  She states she has been limiting her salt intake with food.  Has been exercising with daily walks. Denies blurry vision, headache, shortness of breath, chest pain, dizziness or lightheadedness.   Overall feeling pretty good since restarting her medications.  She wants to know if lemons cause hypertension because she has been eating a lot of them lately and her sister told her this may contribute to HTN.  She recently saw her ophthalmologist outpatient and was told her vision is 20/20 and no evidence of papilledema 2/2 HTN.    ROS: See HPI for pertinent ROS.  Social: pt is a current daily smoker  Medications reviewed.  Objective:   BP (!) 179/98   Pulse (!) 55   Temp 98 F (36.7 C) (Axillary)   Ht 5\' 6"  (1.676 m)   Wt (!) 329 lb 9.6 oz (149.5 kg)   SpO2 98%   BMI 53.20 kg/m  Vitals and nursing note reviewed.  General: 62 yo female, NAD  HEENT: NCAT, EOMI, PERRL, MMM Neck: supple, no JVD  CV: RRR no MRG  Lungs: CTAB, normal effort  Abdomen: soft, NTND, +bs  Skin: warm, dry, no rash Extremities: warm and well perfused Neuro: alert, oriented x3, no focal deficits  Psych: normal mood and affect   Assessment & Plan:   HYPERTENSION, BENIGN SYSTEMIC BP in clinic today 176/99, asymptomatic.  No red flags on physical exam.  Recent visit to ophthalmologist - vision 20/20 and no evidence of papilledema 2/2 hypertension.   I discussed with her the addition of another antihypertensive to current regimen to better control this. Pt prefers to hold off on this to "see if BP improves with time over the next several weeks".  I told her this is unlikely as it has been months of elevated pressures,  however we can certainly follow up in 2-3 weeks for a recheck.  If at that time pressures remain elevated, she may be more receptive to adding another agent.  -encourage lifestyle modifications -counseled on smoking cessation  -strict return precautions discussed -f/u 2-3 weeks    Freddrick MarchYashika Jeanell Mangan, MD [facility], PGY-2 06/16/2017 12:01 PM

## 2017-06-09 NOTE — Patient Instructions (Signed)
It was nice seeing you again today .  You were seen in clinic for a blood pressure follow-up.  Your blood pressure today was 176/99 and remains elevated.  We discussed your diet, exercise and current medication that you are taking.  I would advise you to continue taking this every day and limit your salt intake.  Exercising daily will help lower your blood pressure as well.  I would encourage trying to quit smoking as this can contribute to elevated blood pressures.  As we discussed, at this point we can consider adding another medication versus waiting a few weeks to see if blood pressure improves on current medication with changes in diet and exercise.  Please follow-up in 2-3 weeks for a recheck.  Be well, Tammy MarchYashika Unika Nazareno MD

## 2017-06-16 NOTE — Assessment & Plan Note (Addendum)
BP in clinic today 176/99, asymptomatic.  No red flags on physical exam.  Recent visit to ophthalmologist - vision 20/20 and no evidence of papilledema 2/2 hypertension.   I discussed with her the addition of another antihypertensive to current regimen to better control this. Pt prefers to hold off on this to "see if BP improves with time over the next several weeks".  I told her this is unlikely as it has been months of elevated pressures, however we can certainly follow up in 2-3 weeks for a recheck.  If at that time pressures remain elevated, she may be more receptive to adding another agent.  -encourage lifestyle modifications -counseled on smoking cessation  -strict return precautions discussed -f/u 2-3 weeks

## 2017-06-28 ENCOUNTER — Other Ambulatory Visit: Payer: Self-pay

## 2017-06-28 ENCOUNTER — Telehealth: Payer: Self-pay | Admitting: Family Medicine

## 2017-06-28 DIAGNOSIS — I10 Essential (primary) hypertension: Secondary | ICD-10-CM

## 2017-06-28 MED ORDER — METOPROLOL TARTRATE 100 MG PO TABS: 100.0000 mg | ORAL_TABLET | Freq: Two times a day (BID) | ORAL | 6 refills | Status: DC

## 2017-06-28 MED ORDER — INDAPAMIDE 2.5 MG PO TABS: 1.2500 mg | ORAL_TABLET | Freq: Every day | ORAL | 0 refills | Status: DC

## 2017-06-28 NOTE — Telephone Encounter (Signed)
Pt needs refill on her Lozol and Metoprolol. Pt said she LVM on nurse line this morning but has not heard anything back. Pt said she needs these medications sent to [facility] on [address] [facility] [facility] today, because she is completely out. Please advise

## 2017-06-28 NOTE — Telephone Encounter (Signed)
Pt requesting refill of indapamide and metoprolol Shawna Orleans, RN

## 2017-06-29 NOTE — Telephone Encounter (Signed)
Pt called and said her pharmacy told her she wouldn't be able to get her Lozol until June for some reason. Pt said she needs something in the place of that until then. Please advise

## 2017-06-30 NOTE — Telephone Encounter (Signed)
Called pharmacy to discuss this and it is available for pickup.  Would advise 2.5 mg Lozol daily (1 tablet).  Please inform pt, thanks

## 2017-06-30 NOTE — Telephone Encounter (Signed)
Pt calling again about this bp medication. She is worried that she hasn't gotten it yet and she can't wait until her upcoming appt to get a different bp medication. Please call pt to discuss this. Please advise

## 2017-06-30 NOTE — Telephone Encounter (Signed)
Informed patient that she can pick up medication at pharmacy.  Glennie Hawk, CMA

## 2017-07-13 ENCOUNTER — Other Ambulatory Visit: Payer: Self-pay | Admitting: Family Medicine

## 2017-07-13 ENCOUNTER — Telehealth: Payer: Self-pay

## 2017-07-13 ENCOUNTER — Ambulatory Visit (INDEPENDENT_AMBULATORY_CARE_PROVIDER_SITE_OTHER): Payer: Medicaid Other | Admitting: Family Medicine

## 2017-07-13 VITALS — BP 160/70

## 2017-07-13 DIAGNOSIS — I10 Essential (primary) hypertension: Secondary | ICD-10-CM | POA: Diagnosis not present

## 2017-07-13 DIAGNOSIS — M545 Low back pain: Secondary | ICD-10-CM

## 2017-07-13 DIAGNOSIS — N63 Unspecified lump in unspecified breast: Secondary | ICD-10-CM

## 2017-07-13 DIAGNOSIS — F17210 Nicotine dependence, cigarettes, uncomplicated: Secondary | ICD-10-CM | POA: Diagnosis not present

## 2017-07-13 DIAGNOSIS — Z0000 Encounter for general adult medical examination without abnormal findings: Secondary | ICD-10-CM

## 2017-07-13 DIAGNOSIS — Z1231 Encounter for screening mammogram for malignant neoplasm of breast: Secondary | ICD-10-CM

## 2017-07-13 NOTE — Patient Instructions (Addendum)
It was nice seeing you again today! You were seen in clinic for follow up of your blood pressure.  Your blood pressure today was 160/78 and is much better than it usually is.  I am not making any changes to your medications at this time and would like you to continue limiting salt in your diet.  Exercise can also help lower blood pressure as we discussed. I will have you follow up in about a month for blood pressure follow up.  In the meantime, if you experience headache, dizziness, lightheadedness or blurry vision, I would like you to be seen by a provider.    Additionally, I provided you with a letter to relieve you from jury duty as I anticipate you will not be able to sit for several hours at a time due to your chronic conditions.    Please call clinic if you have any questions.    Be well, Freddrick March, MD

## 2017-07-13 NOTE — Progress Notes (Addendum)
   Subjective:   Patient ID: Tammy [name]    DOB: 1955-10-26, 62 y.o. female   MRN: [id number]  CC: HTN follow-up   HPI: Tammy [name] is a 62 y.o. female who presents to clinic today for HTN follow-up.  Hypertension Patient reports good compliance on indapamide 2.5 mg daily.  Denies headaches, blurry vision, lightheadedness or dizziness today.  No shortness of breath, chest pain, leg swelling.  She has been controlling her salt intake, does not eat canned foods.  Does not add a lot of salt or cooking.  Does endorse she adds salted butter in her vegetables.  She has not been walking a lot for exercise but tries some days.  She is a current daily smoker.  Have addressed smoking cessation with her previously but she is not ready to quit.  Chronic pain Patient has been called for jury duty.  She states she does not have transportation and cannot sit in a chair for >10 hours.  She has a long-standing history of back pain, PAD, CHF and high blood pressure.  She is requesting a letter that we will exempt her from jury duty.   ROS: Denies fever, chills, headache, blurry vision, lightheadedness.  No chest pain, shortness of breath.  Social: Patient is a current daily smoker Medications reviewed. Objective:   BP (!) 160/70  Vitals and nursing note reviewed.  General: Well-appearing 62 year old female, NAD HEENT: NCAT, EOMI, PERRLA Neck: supple, no LAD CV: RRR no MRG Lungs: CTA B, normal effort Abdomen: soft, NTND, positive bowel sounds Skin: Warm, dry, no rash, brisk cap refill Extremities: warm and well perfused, normal tone  Assessment & Plan:   HYPERTENSION, BENIGN SYSTEMIC Improved on current regimen.  Will not make any medication changes today. BP 160/78.  Encourage diet and exercise.  Counseled on tobacco use, pt states she is not yet ready to quit but has reduced the amount she smokes. -Follow-up 1 month  Health Maintenance:  -pt due for mammogram   Letter for jury duty  provided.  Orders Placed This Encounter  Procedures  . MM DIAG BREAST TOMO BILATERAL    INS-MCD PF 03/07/13 @ [facility]/NO NEEDS/TOMO/BC SHE[facility] @ [facility]  COSIGN REQ    Standing Status:   Future    Number of Occurrences:   1    Standing Expiration Date:   09/13/2018    Order Specific Question:   Reason for Exam (SYMPTOM  OR DIAGNOSIS REQUIRED)    Answer:   LT BREAST LUMP    Order Specific Question:   Preferred imaging location?    Answer:   [facility]   Follow-up: 1 month  Freddrick March, MD [facility], PGY-2 07/16/2017 4:54 PM

## 2017-07-13 NOTE — Telephone Encounter (Signed)
Informed patient that diagnostic ultrasound is scheduled for Friday 07/16/2017 at 10 am with a showtime of 0940

## 2017-07-16 ENCOUNTER — Encounter: Payer: Self-pay | Admitting: Family Medicine

## 2017-07-16 ENCOUNTER — Ambulatory Visit
Admission: RE | Admit: 2017-07-16 | Discharge: 2017-07-16 | Disposition: A | Discharge status: Home / Self Care | Payer: Medicaid Other | Source: Ambulatory Visit | Attending: Family Medicine | Admitting: Family Medicine

## 2017-07-16 DIAGNOSIS — N63 Unspecified lump in unspecified breast: Secondary | ICD-10-CM

## 2017-07-16 NOTE — Assessment & Plan Note (Signed)
Improved on current regimen.  Will not make any medication changes today. BP 160/78.  Encourage diet and exercise.  Counseled on tobacco use, pt states she is not yet ready to quit but has reduced the amount she smokes. -Follow-up 1 month

## 2017-08-06 ENCOUNTER — Ambulatory Visit (INDEPENDENT_AMBULATORY_CARE_PROVIDER_SITE_OTHER): Payer: Medicaid Other | Admitting: Family Medicine

## 2017-08-06 ENCOUNTER — Other Ambulatory Visit: Payer: Self-pay

## 2017-08-06 ENCOUNTER — Encounter: Payer: Self-pay | Admitting: Family Medicine

## 2017-08-06 DIAGNOSIS — M62838 Other muscle spasm: Secondary | ICD-10-CM | POA: Diagnosis not present

## 2017-08-06 DIAGNOSIS — I10 Essential (primary) hypertension: Secondary | ICD-10-CM | POA: Diagnosis not present

## 2017-08-06 MED ORDER — INDAPAMIDE 2.5 MG PO TABS: 2.5000 mg | ORAL_TABLET | Freq: Every day | ORAL | 3 refills | Status: DC

## 2017-08-06 MED ORDER — CYCLOBENZAPRINE HCL 5 MG PO TABS: 5.0000 mg | ORAL_TABLET | Freq: Three times a day (TID) | ORAL | 0 refills | Status: DC | PRN

## 2017-08-06 NOTE — Progress Notes (Signed)
   Subjective:   Patient ID: Tammy [name]    DOB: Dec 06, 1955, 62 y.o. female   MRN: [id number]  CC:  htn f/u, mammogram result, muscle spasm  HPI: Tammy [name] is a 62 y.o. female who presents to clinic today for the following issues.  Hypertension -Taking Metoprolol 100 mg BID and Indapamide 2.5 mg daily  -reports good compliance with antihypertensives -diet: limits salt, eats baked and boiled meats, no fried foods, snacks occasionally  -exercise: used to walk all the time, now walks about 15 min a day  F/u mammogram Reviewed results from last mammogram which revealed normal fibroglandular tissue.  Patient expresses concern with results and reassurance provided that no malignancy.  Recommend follow-up with mammogram in 1 year.  Muscle spasm  Reports lower back pain that radiates across lumbar region bilaterally and "eases out on its own."  She has been taking Aleve muscle relief prn which helps. She takes about 1-2 tabs a day.  Episodes occurs every few days.  No difficulty with urination or bowel movements.  No loss of bowel or bladder control.  No fever, chills, nausea, vomiting.  Denies urinary symptoms.  No weakness, numbness or tingling in legs.  No history of trauma.  ROS: No fever, chills, nausea, vomiting.  No headache, lightheadedness or vision changes.  Social: pt is a current daily smoker Medications reviewed. Objective:   BP 116/80   Pulse (!) 56   Temp 97.6 F (36.4 C) (Oral)   Wt (!) 320 lb (145.2 kg)   SpO2 99%   BMI 51.65 kg/m  Vitals and nursing note reviewed.  General: 62 year old AA female, NAD HEENT: NCAT, EOMI, PERRLA, MMM Neck: supple, nontender, normal range of motion CV: RRR no MRG Lungs: CTAB, normal effort on RA  MSK: Lumbar spine-no obvious deformity, no overlying erythema or bruising, normal range of motion, normal ROM with flexion and extension  Skin: warm, dry, no rash  Extremities: warm and well perfused, normal tone Neuro: Alert,  oriented x3, no focal deficits ,  motor strength 5/5 bilaterally, normal sensation psych: Normal mood and affect  Assessment & Plan:   HYPERTENSION, BENIGN SYSTEMIC Blood pressure is much improved today: 116/80 and at goal on current antihypertensive.  Asymptomatic and no red flags on exam.  Will continue current management.  Have discussed smoking cessation with patient in the past however she is not ready to quit.  - Refill indapamide  Muscle spasm Patient reports bilateral lumbar pain likely 2/2 to muscle spasm.  No red flags on exam and no history of trauma to suspect fracture.  We will trial a short course of muscle relaxant.  - Rx: Flexeril -Return precautions discussed  Meds ordered this encounter  Medications  . cyclobenzaprine (FLEXERIL) 5 MG tablet    Sig: Take 1 tablet (5 mg total) by mouth every 8 (eight) hours as needed for muscle spasms.    Dispense:  15 tablet    Refill:  0  . indapamide (LOZOL) 2.5 MG tablet    Sig: Take 1 tablet (2.5 mg total) by mouth daily.    Dispense:  30 tablet    Refill:  3    Freddrick MarchYashika Jaynie Hitch, MD [facility], PGY-2 08/08/2017 3:06 PM

## 2017-08-06 NOTE — Patient Instructions (Signed)
It was nice seeing you again today!  You were seen in clinic for blood pressure follow-up, discussing your mammogram and for muscle spasm of your lower back.  I am glad your blood pressure is well controlled today: 116/80.  This is much better and lower than your previous blood pressures.  I would encourage you to continue taking your current medications and I am not making any changes to your regimen at this time.  I have refilled your medications and you can pick this up from the pharmacy.   We discussed your mammogram results which showed fibroglandular tissue, or scar tissue.  This is a normal finding and can be followed up with a mammogram in 1 year to check for any changes.   For your lower back pain, you most likely have a muscle spasm.  I would recommend taking Aleve no more than twice a day.  I have prescribed you a short course of Flexeril.  Please be careful taking this medication as it can cause drowsiness.  I would not recommend driving a vehicle after taking this medication.    Please call clinic if you have any questions.  Be well, Freddrick MarchYashika Payal Stanforth MD

## 2017-08-08 DIAGNOSIS — M62838 Other muscle spasm: Secondary | ICD-10-CM | POA: Insufficient documentation

## 2017-08-08 NOTE — Assessment & Plan Note (Addendum)
Patient reports bilateral lumbar pain likely 2/2 to muscle spasm.  No red flags on exam and no history of trauma to suspect fracture.  We will trial a short course of muscle relaxant.  - Rx: Flexeril -Return precautions discussed

## 2017-08-08 NOTE — Assessment & Plan Note (Addendum)
Blood pressure is much improved today: 116/80 and at goal on current antihypertensive.  Asymptomatic and no red flags on exam.  Will continue current management.  Have discussed smoking cessation with patient in the past however she is not ready to quit.  - Refill indapamide

## 2017-08-30 ENCOUNTER — Other Ambulatory Visit: Payer: Self-pay | Admitting: Family Medicine

## 2017-08-30 DIAGNOSIS — I739 Peripheral vascular disease, unspecified: Secondary | ICD-10-CM

## 2017-08-31 ENCOUNTER — Other Ambulatory Visit: Payer: Self-pay | Admitting: Family Medicine

## 2017-08-31 DIAGNOSIS — I739 Peripheral vascular disease, unspecified: Secondary | ICD-10-CM

## 2017-08-31 MED ORDER — CLOPIDOGREL BISULFATE 75 MG PO TABS: 75.0000 mg | ORAL_TABLET | Freq: Every day | ORAL | 11 refills | Status: DC

## 2017-08-31 NOTE — Telephone Encounter (Signed)
Pt needs refill on her blood thinner. Pt didn't know the name of it and said the pharmacy was supposed to be requesting it. Please advise

## 2017-09-30 ENCOUNTER — Other Ambulatory Visit: Payer: Self-pay | Admitting: Family Medicine

## 2017-10-29 ENCOUNTER — Ambulatory Visit: Payer: Medicaid Other | Admitting: Cardiovascular Disease

## 2017-10-29 ENCOUNTER — Encounter

## 2017-10-29 ENCOUNTER — Encounter: Payer: Self-pay | Admitting: Cardiovascular Disease

## 2017-10-29 VITALS — BP 146/88 | HR 63 | Ht 66.0 in | Wt 314.0 lb

## 2017-10-29 DIAGNOSIS — E782 Mixed hyperlipidemia: Secondary | ICD-10-CM

## 2017-10-29 DIAGNOSIS — I10 Essential (primary) hypertension: Secondary | ICD-10-CM

## 2017-10-29 DIAGNOSIS — I214 Non-ST elevation (NSTEMI) myocardial infarction: Secondary | ICD-10-CM | POA: Diagnosis not present

## 2017-10-29 NOTE — Progress Notes (Signed)
Cardiology Office Note:    Date:  10/29/2017   ID:  Tammy [name], DOB 08/12/1955, MRN [id number]  PCP:  Freddrick March, MD  Cardiologist:  Tobias Alexander, MD, Kristeen Miss, MD   Referring MD: Freddrick March, MD   Chief Complaint  Patient presents with  . Coronary Artery Disease    History of Present Illness:    Tammy [name] is a 62 y.o. female with a hx of CAD, HTN, , PVD   She was admitted in May, 2016 with chest pain and positive cardiac enzymes.  She has a history of hypertension and cigarette smoking.  Troponin levels were elevated.  Peak troponin level 2.75.  Left heart cath  revealed numerous mild to moderate irregularities.  Prox RCA lesion, 30% stenosed.  Mid RCA lesion, 60% stenosed.  RPDA lesion, 40% stenosed.  Post Atrio lesion, 20% stenosed.  Dist RCA lesion, 20% stenosed.  Prox Cx to Mid Cx lesion, 20% stenosed.  1st Mrg lesion, 20% stenosed.  2nd Mrg lesion, 40% stenosed.  Ost 3rd Mrg to 3rd Mrg lesion, 50% stenosed.  Ost LAD to Prox LAD lesion, 30% stenosed.  Mid LAD lesion, 30% stenosed.  Dist LAD lesion, 60% stenosed.  Is not able to walk due to leg pain when she walks .  She continues smoke .    No further chest pain   Aug.  30, 2019  Seen for follow up . Has some back spasms Walks some Still smoking  - knows she should quit  BP is a bit elevated today . Avoids salty foods for the most part  Does not eat out muc  Past Medical History:  Diagnosis Date  . Abnormal mammogram 10.25.11   area of density with several adjacent amorphous calcifications located laterally w/in the right breast at approx 9-10 o'clock position likely represents area of evolving far necrosis; f/u mamo and U/S in 6 months  . CAD (coronary artery disease)    a. 07/2014 NSTEMI/Cath: LM nl, LAD 30ost/m, 60d, LCX 20p, OM1 20, OM2 40, OM3 50, RCA 30p, 13m, 20d, RPDA 40, EF 65%-->Med Rx;  b. 07/2014 Echo: EF 55-60%, no rwma, mildly dil LA.  . Essential  hypertension   . GERD (gastroesophageal reflux disease)   . Hyperlipidemia   . Morbid obesity (HCC)   . PAD (peripheral artery disease) (HCC)    a. 07/2014 ABI's: R 0.88, L 0.32.  Marland Kitchen Ulcer     Past Surgical History:  Procedure Laterality Date  . CARDIAC CATHETERIZATION N/A 07/16/2014   Procedure: Left Heart Cath and Coronary Angiography;  Surgeon: Kathleene Hazel, MD;  Location[facility]AS[facility] CV LAB;  Service: Cardiovascular;  Laterality: N/A;  . CESAREAN SECTION    . PERIPHERAL VASCULAR CATHETERIZATION N/A 02/12/2015   Procedure: Abdominal Aortogram;  Surgeon: Nada Libman, MD;  Location: [facility] INVASIVE CV LAB;  Service: Cardiovascular;  Laterality: N/A;  . TRIGGER FINGER RELEASE Left    middle finger  . UPPER GASTROINTESTINAL ENDOSCOPY  1991    Current Medications: Current Meds  Medication Sig  . amLODipine (NORVASC) 10 MG tablet TAKE 1 TABLET BY MOUTH EVERY DAY  . aspirin 81 MG EC tablet Take 1 tablet (81 mg total) by mouth daily.  Marland Kitchen atorvastatin (LIPITOR) 80 MG tablet TAKE 80 mg TABLET BY MOUTH EVERY DAY  . clopidogrel (PLAVIX) 75 MG tablet Take 1 tablet (75 mg total) by mouth daily.  . cyclobenzaprine (FLEXERIL) 5 MG tablet TAKE 1 TABLET (5 MG TOTAL) BY MOUTH  EVERY 8 (EIGHT) HOURS AS NEEDED FOR MUSCLE SPASMS.  . indapamide (LOZOL) 2.5 MG tablet Take 1 tablet (2.5 mg total) by mouth daily.  . metoprolol tartrate (LOPRESSOR) 100 MG tablet Take 1 tablet (100 mg total) by mouth 2 (two) times daily.  . nitroGLYCERIN (NITROSTAT) 0.4 MG SL tablet Place 1 tablet (0.4 mg total) under the tongue every 5 (five) minutes as needed for chest pain.   Current Facility-Administered Medications for the 10/29/17 encounter (Office Visit) with Doylene Splinter, Deloris Ping, MD  Medication  . acetaminophen (TYLENOL) tablet 975 mg     Allergies:   Ace inhibitors and Hydrochlorothiazide   Social History   Socioeconomic History  . Marital status: Single    Spouse name: Not on file  . Number of children:  Not on file  . Years of education: Not on file  . Highest education level: Not on file  Occupational History  . Not on file  Social Needs  . Financial resource strain: Not on file  . Food insecurity:    Worry: Not on file    Inability: Not on file  . Transportation needs:    Medical: Not on file    Non-medical: Not on file  Tobacco Use  . Smoking status: Current Every Day Smoker    Packs/day: 0.25    Years: 30.00    Pack years: 7.50    Types: Cigarettes  . Smokeless tobacco: Never Used  . Tobacco comment: 8-10 cigarettes per day  Substance and Sexual Activity  . Alcohol use: No    Alcohol/week: 0.0 standard drinks  . Drug use: No  . Sexual activity: Not Currently    Birth control/protection: Post-menopausal, Abstinence  Lifestyle  . Physical activity:    Days per week: Not on file    Minutes per session: Not on file  . Stress: Not on file  Relationships  . Social connections:    Talks on phone: Not on file    Gets together: Not on file    Attends religious service: Not on file    Active member of club or organization: Not on file    Attends meetings of clubs or organizations: Not on file    Relationship status: Not on file  Other Topics Concern  . Not on file  Social History Narrative  . Not on file     Family History: The patient's family history includes Breast cancer in her maternal aunt; Cancer in her maternal aunt and mother; Diabetes in her father and mother; Hypertension in her brother, father, and sister; Ovarian cancer in her maternal aunt. ROS:   Please see the history of present illness.     All other systems reviewed and are negative.  EKGs/Labs/Other Studies Reviewed:    The following studies were reviewed today:     Recent Labs: 01/19/2017: ALT 16 04/06/2017: BUN 10; Creatinine, Ser 0.93; Hemoglobin 12.4; Platelets 200; Potassium 3.6; Sodium 138  Recent Lipid Panel    Component Value Date/Time   CHOL 172 01/19/2017 1100   TRIG 108  01/19/2017 1100   HDL 52 01/19/2017 1100   CHOLHDL 3.3 01/19/2017 1100   CHOLHDL 4.3 07/15/2014 0600   VLDL 23 07/15/2014 0600   LDLCALC 98 01/19/2017 1100    Physical Exam:    VS:  BP (!) 146/88   Pulse 63   Ht 5\' 6"  (1.676 m)   Wt (!) 314 lb (142.4 kg)   SpO2 97%   BMI 50.68 kg/m     Wt  Readings from Last 3 Encounters:  10/29/17 (!) 314 lb (142.4 kg)  08/06/17 (!) 320 lb (145.2 kg)  06/09/17 (!) 329 lb 9.6 oz (149.5 kg)     GEN:  Morbidly obese female,   no acute distress HEENT: Normal NECK: No JVD; No carotid bruits LYMPHATICS: No lymphadenopathy CARDIAC: RRR, no murmurs, rubs, gallops RESPIRATORY:  Clear to auscultation without rales, wheezing or rhonchi  ABDOMEN: Soft, non-tender, non-distended MUSCULOSKELETAL:  No edema; No deformity  SKIN: Warm and dry NEUROLOGIC:  Alert and oriented x 3 PSYCHIATRIC:  Normal affect   EKG:   January 19, 2017: Sinus bradycardia at 51.  She has nonspecific ST and normalities.  ASSESSMENT:    No diagnosis found. PLAN:    In order of problems listed above:  1. Coronary artery disease: Patient presented with chest pain in May, 2016.  She had positive troponin level of 2.75.  Heart catheterization revealed mild to moderate diffuse coronary artery disease.   Prox RCA lesion, 30% stenosed.  Mid RCA lesion, 60% stenosed.  RPDA lesion, 40% stenosed.  Post Atrio lesion, 20% stenosed.  Dist RCA lesion, 20% stenosed.  Prox Cx to Mid Cx lesion, 20% stenosed.  1st Mrg lesion, 20% stenosed.  2nd Mrg lesion, 40% stenosed.  Ost 3rd Mrg to 3rd Mrg lesion, 50% stenosed.  Ost LAD to Prox LAD lesion, 30% stenosed.  Mid LAD lesion, 30% stenosed.  Dist LAD lesion, 60% stenosed.  She is not having any further episodes of chest pain.  Advised her to stop smoking.  Continue current medications.  She is not having any angina.  2.  Hyperlipidemia: Primary medical doctor we will draw labs again in October.  3.  Morbid obesity:    Encouraged to diet, exercise, weight loss program.   Medication Adjustments/Labs and Tests Ordered: Current medicines are reviewed at length with the patient today.  Concerns regarding medicines are outlined above.  No orders of the defined types were placed in this encounter.  No orders of the defined types were placed in this encounter.   Signed, Kristeen MissPhilip Hena Ewalt, MD  10/29/2017 11:33 AM    [facility]

## 2017-10-29 NOTE — Patient Instructions (Addendum)
Medication Instructions:  Your physician recommends that you continue on your current medications as directed. Please refer to the Current Medication list given to you today.   Labwork: None Ordered   Testing/Procedures: None Ordered   Follow-Up: Your physician wants you to follow-up in: 1 year with a Nurse Practitioner or PA on Dr. Harvie BridgeNahser's team. You will receive a reminder letter in the mail two months in advance. If you don't receive a letter, please call our office to schedule the follow-up appointment.   If you need a refill on your cardiac medications before your next appointment, please call your pharmacy.   Thank you for choosing [facility]! Eligha BridegroomMichelle Swinyer, RN [phone]

## 2017-12-22 ENCOUNTER — Ambulatory Visit (INDEPENDENT_AMBULATORY_CARE_PROVIDER_SITE_OTHER): Payer: Medicaid Other

## 2017-12-22 DIAGNOSIS — Z23 Encounter for immunization: Secondary | ICD-10-CM | POA: Diagnosis not present

## 2018-01-14 ENCOUNTER — Other Ambulatory Visit: Payer: Self-pay | Admitting: Family Medicine

## 2018-01-31 ENCOUNTER — Telehealth: Payer: Self-pay | Admitting: Family Medicine

## 2018-01-31 ENCOUNTER — Ambulatory Visit: Payer: Medicaid Other | Admitting: Family Medicine

## 2018-01-31 NOTE — Telephone Encounter (Signed)
Pt called requesting to get a call stated she have a question. Best phone # to contact [phone].

## 2018-01-31 NOTE — Telephone Encounter (Signed)
Called patient to inquire as to what she needed and she says that since she has an appointment on 02/01/2018 that she will talk to her then.  Tammy [name].Dharma Pare R, CMA

## 2018-02-01 ENCOUNTER — Other Ambulatory Visit: Payer: Self-pay

## 2018-02-01 ENCOUNTER — Ambulatory Visit: Payer: Medicaid Other | Admitting: Family Medicine

## 2018-02-01 ENCOUNTER — Encounter: Payer: Self-pay | Admitting: Family Medicine

## 2018-02-01 ENCOUNTER — Other Ambulatory Visit (HOSPITAL_COMMUNITY)
Admission: RE | Admit: 2018-02-01 | Discharge: 2018-02-01 | Disposition: A | Discharge status: Home / Self Care | Payer: Medicaid Other | Source: Ambulatory Visit | Attending: Family Medicine | Admitting: Family Medicine

## 2018-02-01 VITALS — BP 174/98 | HR 61 | Temp 97.6°F | Ht 66.0 in | Wt 310.2 lb

## 2018-02-01 DIAGNOSIS — Z124 Encounter for screening for malignant neoplasm of cervix: Secondary | ICD-10-CM

## 2018-02-01 DIAGNOSIS — Z113 Encounter for screening for infections with a predominantly sexual mode of transmission: Secondary | ICD-10-CM

## 2018-02-01 DIAGNOSIS — I739 Peripheral vascular disease, unspecified: Secondary | ICD-10-CM | POA: Diagnosis not present

## 2018-02-01 DIAGNOSIS — I10 Essential (primary) hypertension: Secondary | ICD-10-CM | POA: Diagnosis not present

## 2018-02-01 LAB — POCT WET PREP (WET MOUNT)
CLUE CELLS WET PREP WHIFF POC: POSITIVE
Trichomonas Wet Prep HPF POC: ABSENT

## 2018-02-01 MED ORDER — INDAPAMIDE 2.5 MG PO TABS: 2.5000 mg | ORAL_TABLET | Freq: Every day | ORAL | 3 refills | Status: DC

## 2018-02-01 MED ORDER — METOPROLOL TARTRATE 100 MG PO TABS: 100.0000 mg | ORAL_TABLET | Freq: Two times a day (BID) | ORAL | 6 refills | Status: DC

## 2018-02-01 MED ORDER — ASPIRIN 81 MG PO TBEC: 81.0000 mg | DELAYED_RELEASE_TABLET | Freq: Every day | ORAL | 6 refills | Status: DC

## 2018-02-01 MED ORDER — ATORVASTATIN CALCIUM 80 MG PO TABS: ORAL_TABLET | ORAL | 3 refills | Status: DC

## 2018-02-01 NOTE — Assessment & Plan Note (Addendum)
Uncontrolled, refilled Lopressor and Lozol.  Patient is skeptical about trying a different blood pressure medication as she says she is allergic to all of them.  She is also concerned that her blood pressure medications are causing breast changes, reassurance provided

## 2018-02-01 NOTE — Patient Instructions (Signed)
It was nice seeing you again today.  You were seen in clinic for STD testing and a breast rash.  As we discussed, your breast rash is most likely a small pimple popped and scarred over.  For your STD testing, we took some samples today as well as performed a Pap smear.  I will call you once at the results of your testing. Please call clinic if any questions.  Freddrick MarchYashika Aliyanah Rozas MD

## 2018-02-01 NOTE — Progress Notes (Signed)
   Subjective:   Patient ID: Tammy [name]    DOB: January 22, 1956, 62 y.o. female   MRN: [id number]  CC: STD testing, ?breast rash, Pap smear  HPI: Tammy FusCheryl A Jentz is a 62 y.o. female who presents to clinic today for the following issues.  STD testing  No new sexual partners.  Sexual partner does not have any symptoms or known history of STDs.  Patient desires STD testing just to be sure she does not have anything.  She denies vaginal discharge, odor or irritation.  No urinary symptoms, fever, chills.  Breast rash  Small pimple about 1 week ago, some pus came out, popped and scarred over.  She feels this may be due to her blood pressure medications and is concerned.  ROS: No fevers, chills, nausea, vomiting.  No abdominal pain, shortness of breath.    Social: pt is a current daily smoker.  Medications reviewed. Objective:   BP (!) 174/98   Pulse 61   Temp 97.6 F (36.4 C) (Oral)   Ht 5\' 6"  (1.676 m)   Wt (!) 310 lb 3.2 oz (140.7 kg)   SpO2 99%   BMI 50.07 kg/m  Vitals and nursing note reviewed.  General: 62 year old female, NAD  Neck: supple CV: RRR no MRG  Lungs: CTAB, normal effort  Abdomen: soft, NTND, +bs  Skin: warm, dry Breast: Large pendulous breasts, 3 mm hyperpigmented scarred-over comedone noted on right breast, no warmth, erythema, induration or swelling associated Pelvic exam: normal external genitalia, vulva, vagina, cervix, uterus and adnexa. No vaginal discharge noted.   Extremities: warm and well perfused Neuro: alert, oriented x3, no focal deficits   Assessment & Plan:   STD testing No new sexual partners, patient desires STD testing to be sure she does not have anything.  Denies current symptoms. Wet prep and GC chlamydia today Add Pap smear RPR and HIV declined  HYPERTENSION, BENIGN SYSTEMIC Uncontrolled, refilled Lopressor and Lozol.  Patient is skeptical about trying a different blood pressure medication as she says she is allergic to all of  them.  She is also concerned that her blood pressure medications are causing breast changes, reassurance provided  Breast rash Clinical exam consistent with a scarred over pimple.  No systemic symptoms or concerns for cellulitis.  Patient seems very leery of the medications she is on despite reassurance.  Continue to monitor.   Health maintenance -Pap smear today  Orders Placed This Encounter  Procedures  . POCT Wet Prep [facility] [place])   Meds ordered this encounter  Medications  . metoprolol tartrate (LOPRESSOR) 100 MG tablet    Sig: Take 1 tablet (100 mg total) by mouth 2 (two) times daily.    Dispense:  60 tablet    Refill:  6  . indapamide (LOZOL) 2.5 MG tablet    Sig: Take 1 tablet (2.5 mg total) by mouth daily.    Dispense:  30 tablet    Refill:  3  . atorvastatin (LIPITOR) 80 MG tablet    Sig: TAKE 80 mg TABLET BY MOUTH EVERY DAY    Dispense:  30 tablet    Refill:  3  . aspirin 81 MG EC tablet    Sig: Take 1 tablet (81 mg total) by mouth daily.    Dispense:  30 tablet    Refill:  6    Freddrick MarchYashika Gloyd Happ, MD [facility]

## 2018-02-02 LAB — CERVICOVAGINAL ANCILLARY ONLY
Chlamydia: NEGATIVE
Neisseria Gonorrhea: NEGATIVE

## 2018-02-04 LAB — CYTOLOGY - PAP
ADEQUACY: ABSENT
DIAGNOSIS: NEGATIVE
HPV (WINDOPATH): NOT DETECTED

## 2018-02-21 ENCOUNTER — Other Ambulatory Visit: Payer: Self-pay | Admitting: Family Medicine

## 2018-02-21 MED ORDER — CYCLOBENZAPRINE HCL 5 MG PO TABS: 5.0000 mg | ORAL_TABLET | Freq: Three times a day (TID) | ORAL | 0 refills | Status: DC | PRN

## 2018-02-21 NOTE — Telephone Encounter (Signed)
Pt would like to know if she can have a refill on her Flexeril. Pt said she is in a lot of pain but is not able to come into the office during the holidays.

## 2018-03-08 ENCOUNTER — Other Ambulatory Visit: Payer: Self-pay

## 2018-03-08 ENCOUNTER — Encounter: Payer: Self-pay | Admitting: Family Medicine

## 2018-03-08 ENCOUNTER — Ambulatory Visit (INDEPENDENT_AMBULATORY_CARE_PROVIDER_SITE_OTHER): Payer: Medicaid Other | Admitting: Family Medicine

## 2018-03-08 VITALS — BP 136/88 | HR 61 | Temp 97.5°F | Ht 66.0 in | Wt 312.0 lb

## 2018-03-08 DIAGNOSIS — M62838 Other muscle spasm: Secondary | ICD-10-CM | POA: Diagnosis not present

## 2018-03-08 DIAGNOSIS — I10 Essential (primary) hypertension: Secondary | ICD-10-CM

## 2018-03-08 DIAGNOSIS — M545 Low back pain, unspecified: Secondary | ICD-10-CM

## 2018-03-08 DIAGNOSIS — I2510 Atherosclerotic heart disease of native coronary artery without angina pectoris: Secondary | ICD-10-CM | POA: Diagnosis not present

## 2018-03-08 NOTE — Patient Instructions (Signed)
You were seen in clinic for lab work and follow-up of your back pain.  As we discussed, I will refer you to a physical therapist to work with you regarding the back pain.  You can expect a call within about a week or so regarding scheduling this appointment.  In the meantime, I would recommend continuing to use a heating pad and hot showers to alleviate your muscle spasm.   Please call clinic if any questions.   Freddrick March MD

## 2018-03-08 NOTE — Progress Notes (Signed)
   Subjective:   Patient ID: Tammy [name]    DOB: 1956-01-09, 63 y.o. female   MRN: [id number]  CC: labwork, f/u back pain  HPI: Tammy FusCheryl A Eng is a 63 y.o. female who presents to clinic today for the following issue.  Back pain  Still having muscle spasm, occurred about 3x last month.  Has symptoms present today, feels like a grabbing sensation in her upper back which makes her catch her breath.  She has been sleeping on a queen size bed that has an old box spring mattress.  She states she is going to be buying a new bed soon as she thinks this may be the reason behind her discomfort.   Has tried hot showers and heating pads but has not helped much.  Flexeril is the only medication which helps. Has not tried physical therapy but is interested in it st this time. No CP, SOB, fever, chills.   ROS: See HPI for pertinent ROS.    Social: pt is a current daily smoker.  Medications reviewed.  Objective:   BP 136/88   Pulse 61   Temp (!) 97.5 F (36.4 C) (Oral)   Ht 5\' 6"  (1.676 m)   Wt (!) 312 lb (141.5 kg)   SpO2 97%   BMI 50.36 kg/m  Vitals and nursing note reviewed.  General: 63 yo female, NAD  HEENT: NCAT, EOMI, PERRL, MMM Neck: supple, normal ROM  CV: RRR no MRG  Lungs: CTAB, normal effort  Abdomen: soft, +bs  Skin: warm, dry, no rash Extremities: warm and well perfused Thoracic/lumbar: no obvious deformity noted, normal ROM, no TTP over vertebrae or paraspinal musculature, noted to have some tightness over left thoracic paraspinal muscle likely 2/2 spasm Neuro: alert, oriented x3, no focal deficits, normal sensation   Assessment & Plan:   Muscle spasm Reports several episodes recently, alleviated by flexeril only.  Discussed proper sleep hygiene, she is to buy a new mattress soon.  Continue use of heating pad and hot showers to relax muscles.  Gentle ROM exercises recommended.  -Pt initially interested in PT however later expressed wanting to cancel this referral as  she is in too much pain to attend appointments   Orders Placed This Encounter  Procedures  . Lipid Panel    Order Specific Question:   Has the patient fasted?    Answer:   No  . Comprehensive metabolic panel    Order Specific Question:   Has the patient fasted?    Answer:   No   Freddrick MarchYashika Devlyn Parish, MD [facility] PGY-3

## 2018-03-09 ENCOUNTER — Telehealth: Payer: Self-pay | Admitting: Family Medicine

## 2018-03-09 LAB — COMPREHENSIVE METABOLIC PANEL
ALT: 8 IU/L (ref 0–32)
AST: 12 IU/L (ref 0–40)
Albumin/Globulin Ratio: 1.2 (ref 1.2–2.2)
Albumin: 4 g/dL (ref 3.6–4.8)
Alkaline Phosphatase: 118 IU/L — ABNORMAL HIGH (ref 39–117)
BUN / CREAT RATIO: 19 (ref 12–28)
BUN: 25 mg/dL (ref 8–27)
Bilirubin Total: 0.3 mg/dL (ref 0.0–1.2)
CO2: 21 mmol/L (ref 20–29)
Calcium: 9.7 mg/dL (ref 8.7–10.3)
Chloride: 102 mmol/L (ref 96–106)
Creatinine, Ser: 1.33 mg/dL — ABNORMAL HIGH (ref 0.57–1.00)
GFR calc non Af Amer: 43 mL/min/{1.73_m2} — ABNORMAL LOW (ref >59)
GFR, EST AFRICAN AMERICAN: 50 mL/min/{1.73_m2} — AB (ref >59)
Globulin, Total: 3.3 g/dL (ref 1.5–4.5)
Glucose: 164 mg/dL — ABNORMAL HIGH (ref 65–99)
Potassium: 5 mmol/L (ref 3.5–5.2)
Sodium: 138 mmol/L (ref 134–144)
Total Protein: 7.3 g/dL (ref 6.0–8.5)

## 2018-03-09 LAB — LIPID PANEL
CHOLESTEROL TOTAL: 194 mg/dL (ref 100–199)
Chol/HDL Ratio: 4.1 ratio (ref 0.0–4.4)
HDL: 47 mg/dL (ref >39)
LDL Calculated: 121 mg/dL — ABNORMAL HIGH (ref 0–99)
TRIGLYCERIDES: 129 mg/dL (ref 0–149)
VLDL CHOLESTEROL CAL: 26 mg/dL (ref 5–40)

## 2018-03-09 NOTE — Telephone Encounter (Signed)
Pt is calling and said that she had an appointment yesterday with Dr. Nelson Chimes and she planned on placing a referral for the pt to have physical therapy.   Pt would like her to cancel the referral because she said she is in to much pain and just cant do it.

## 2018-03-09 NOTE — Telephone Encounter (Signed)
Noted, I have cancelled the referral thanks!

## 2018-03-09 NOTE — Assessment & Plan Note (Signed)
Reports several episodes recently, alleviated by flexeril only.  Discussed proper sleep hygiene, she is to buy a new mattress soon.  Continue use of heating pad and hot showers to relax muscles.  Gentle ROM exercises recommended.  -Pt initially interested in PT however later expressed wanting to cancel this referral as she is in too much pain to attend appointments

## 2018-03-30 ENCOUNTER — Other Ambulatory Visit: Payer: Self-pay

## 2018-03-30 MED ORDER — CYCLOBENZAPRINE HCL 5 MG PO TABS: 5.0000 mg | ORAL_TABLET | Freq: Three times a day (TID) | ORAL | 0 refills | Status: DC | PRN

## 2018-05-12 ENCOUNTER — Other Ambulatory Visit: Payer: Self-pay | Admitting: Family Medicine

## 2018-07-05 ENCOUNTER — Other Ambulatory Visit: Payer: Self-pay | Admitting: Family Medicine

## 2018-07-18 ENCOUNTER — Other Ambulatory Visit: Payer: Self-pay | Admitting: Family Medicine

## 2018-07-18 DIAGNOSIS — Z1231 Encounter for screening mammogram for malignant neoplasm of breast: Secondary | ICD-10-CM

## 2018-08-01 ENCOUNTER — Other Ambulatory Visit: Payer: Self-pay | Admitting: Family Medicine

## 2018-08-02 ENCOUNTER — Other Ambulatory Visit: Payer: Self-pay | Admitting: Family Medicine

## 2018-08-13 ENCOUNTER — Ambulatory Visit
Admission: RE | Admit: 2018-08-13 | Discharge: 2018-08-13 | Disposition: A | Discharge status: Home / Self Care | Payer: Medicaid Other | Source: Ambulatory Visit | Attending: Internal Medicine | Admitting: Internal Medicine

## 2018-08-13 ENCOUNTER — Other Ambulatory Visit: Payer: Self-pay

## 2018-08-13 DIAGNOSIS — Z1231 Encounter for screening mammogram for malignant neoplasm of breast: Secondary | ICD-10-CM | POA: Diagnosis not present

## 2018-08-16 ENCOUNTER — Other Ambulatory Visit: Payer: Self-pay | Admitting: Internal Medicine

## 2018-08-16 DIAGNOSIS — R928 Other abnormal and inconclusive findings on diagnostic imaging of breast: Secondary | ICD-10-CM

## 2018-08-25 ENCOUNTER — Other Ambulatory Visit: Payer: Self-pay | Admitting: Family Medicine

## 2018-08-25 ENCOUNTER — Other Ambulatory Visit: Payer: Self-pay | Admitting: Internal Medicine

## 2018-08-25 DIAGNOSIS — R928 Other abnormal and inconclusive findings on diagnostic imaging of breast: Secondary | ICD-10-CM

## 2018-08-26 ENCOUNTER — Other Ambulatory Visit: Payer: Self-pay

## 2018-08-26 ENCOUNTER — Other Ambulatory Visit: Payer: Self-pay | Admitting: Family Medicine

## 2018-08-26 ENCOUNTER — Ambulatory Visit
Admission: RE | Admit: 2018-08-26 | Discharge: 2018-08-26 | Disposition: A | Discharge status: Home / Self Care | Payer: Medicaid Other | Source: Ambulatory Visit | Attending: Internal Medicine | Admitting: Internal Medicine

## 2018-08-26 DIAGNOSIS — N6459 Other signs and symptoms in breast: Secondary | ICD-10-CM | POA: Diagnosis not present

## 2018-08-26 DIAGNOSIS — R928 Other abnormal and inconclusive findings on diagnostic imaging of breast: Secondary | ICD-10-CM | POA: Diagnosis not present

## 2018-08-26 DIAGNOSIS — N6320 Unspecified lump in the left breast, unspecified quadrant: Secondary | ICD-10-CM

## 2018-09-01 ENCOUNTER — Ambulatory Visit
Admission: RE | Admit: 2018-09-01 | Discharge: 2018-09-01 | Disposition: A | Discharge status: Home / Self Care | Payer: Medicaid Other | Source: Ambulatory Visit | Attending: Family Medicine | Admitting: Family Medicine

## 2018-09-01 ENCOUNTER — Other Ambulatory Visit: Payer: Self-pay

## 2018-09-01 DIAGNOSIS — N6322 Unspecified lump in the left breast, upper inner quadrant: Secondary | ICD-10-CM | POA: Diagnosis not present

## 2018-09-01 DIAGNOSIS — N6320 Unspecified lump in the left breast, unspecified quadrant: Secondary | ICD-10-CM

## 2018-09-01 DIAGNOSIS — N641 Fat necrosis of breast: Secondary | ICD-10-CM | POA: Diagnosis not present

## 2018-09-01 HISTORY — PX: BREAST BIOPSY: SHX20

## 2018-09-15 ENCOUNTER — Other Ambulatory Visit: Payer: Self-pay | Admitting: *Deleted

## 2018-09-16 MED ORDER — CYCLOBENZAPRINE HCL 5 MG PO TABS: ORAL_TABLET | ORAL | 0 refills | Status: DC

## 2018-10-04 ENCOUNTER — Other Ambulatory Visit: Payer: Self-pay

## 2018-10-04 DIAGNOSIS — I739 Peripheral vascular disease, unspecified: Secondary | ICD-10-CM

## 2018-10-05 ENCOUNTER — Other Ambulatory Visit: Payer: Self-pay | Admitting: Student in an Organized Health Care Education/Training Program

## 2018-10-05 DIAGNOSIS — I739 Peripheral vascular disease, unspecified: Secondary | ICD-10-CM

## 2018-10-05 MED ORDER — CLOPIDOGREL BISULFATE 75 MG PO TABS: 75.0000 mg | ORAL_TABLET | Freq: Every day | ORAL | 0 refills | Status: DC

## 2018-10-05 NOTE — Telephone Encounter (Signed)
Thank you, I'm going to discuss discontinuing this medication with patient at our appointment as it is not indicated any longer.

## 2018-10-05 NOTE — Telephone Encounter (Signed)
LMOVM for pt to call back and make an appt.Deseree Blount, CMA  

## 2018-10-05 NOTE — Telephone Encounter (Signed)
Pt called to get a refill on her medication clopidogrel. Please give pt a call back.

## 2018-10-05 NOTE — Telephone Encounter (Signed)
Hey team, can you please ask the patient to schedule an appointment to discuss the clopidogrel use? I will refill for one month. thanks

## 2018-10-05 NOTE — Telephone Encounter (Signed)
Pt out of meds x 3 days.  She has a followup appt on 10/14/18 ( to meet new MD).  Since pt has been on this medication for years will refill for one month.  Christen Bame, CMA

## 2018-10-12 ENCOUNTER — Ambulatory Visit: Payer: Medicaid Other | Admitting: Cardiology

## 2018-10-12 ENCOUNTER — Other Ambulatory Visit: Payer: Self-pay

## 2018-10-12 ENCOUNTER — Encounter: Payer: Self-pay | Admitting: Cardiology

## 2018-10-12 VITALS — BP 126/80 | HR 52 | Ht 66.0 in | Wt 281.4 lb

## 2018-10-12 DIAGNOSIS — F17200 Nicotine dependence, unspecified, uncomplicated: Secondary | ICD-10-CM

## 2018-10-12 DIAGNOSIS — E785 Hyperlipidemia, unspecified: Secondary | ICD-10-CM | POA: Diagnosis not present

## 2018-10-12 DIAGNOSIS — I739 Peripheral vascular disease, unspecified: Secondary | ICD-10-CM

## 2018-10-12 DIAGNOSIS — I10 Essential (primary) hypertension: Secondary | ICD-10-CM | POA: Diagnosis not present

## 2018-10-12 DIAGNOSIS — I2510 Atherosclerotic heart disease of native coronary artery without angina pectoris: Secondary | ICD-10-CM | POA: Diagnosis not present

## 2018-10-12 NOTE — Progress Notes (Signed)
Cardiology Office Note:    Date:  10/12/2018   ID:  Tammy [name], DOB March 04, 1955, MRN [id number]  PCP:  Leeroy BockAnderson, Chelsey L, DO  Cardiologist:  Kristeen MissPhilip Nahser, MD  Referring MD: Leeroy BockAnderson, Chelsey L, DO   Chief Complaint  Patient presents with   Follow-up   Coronary Artery Disease    History of Present Illness:    Tammy [name] is a 63 y.o. female with a past medical history significant for CAD, hypertension, PVD, GERD, hyperlipidemia, tobacco abuse and morbid obesity. The patient has a history of a non-STEMI 2016 with preserved EF.  Cath demonstrated mild to moderate diffuse CAD.  He was placed on aspirin as a recommendation for ACS.  She is on disability for back problems. She lives alone and does her own housework and preparing meals, but she does not like to cook. She does not do much walking due to PVD related leg pain. She says that she is lazy and hates to cook. Also she can't afford healthy food on fixed income.  She had a bacon egg and cheese biscuit this morning for breakfast.  She denies chest pain/pressure, shortness of breath, orthopnea, PND, edema, lightheadedness or syncope.  She continues to smoke, roughly 7 cigarettes/day, but can be more when she is stressed.  Past Medical History:  Diagnosis Date   Abnormal mammogram 10.25.11   area of density with several adjacent amorphous calcifications located laterally w/in the right breast at approx 9-10 o'clock position likely represents area of evolving far necrosis; f/u mamo and U/S in 6 months   CAD (coronary artery disease)    a. 07/2014 NSTEMI/Cath: LM nl, LAD 30ost/m, 60d, LCX 20p, OM1 20, OM2 40, OM3 50, RCA 30p, 3858m, 20d, RPDA 40, EF 65%-->Med Rx;  b. 07/2014 Echo: EF 55-60%, no rwma, mildly dil LA.   Essential hypertension    GERD (gastroesophageal reflux disease)    Hyperlipidemia    Morbid obesity (HCC)    PAD (peripheral artery disease) (HCC)    a. 07/2014 ABI's: R 0.88, L 0.32.   Ulcer      Past Surgical History:  Procedure Laterality Date   CARDIAC CATHETERIZATION N/A 07/16/2014   Procedure: Left Heart Cath and Coronary Angiography;  Surgeon: Kathleene Hazelhristopher D McAlhany, MD;  [facility];  Service: Cardiovascular;  Laterality: N/A;   CESAREAN SECTION     PERIPHERAL VASCULAR CATHETERIZATION N/A 02/12/2015   Procedure: Abdominal Aortogram;  Surgeon: Nada LibmanVance W Brabham, MD;  Location: [facility];  Service: Cardiovascular;  Laterality: N/A;   TRIGGER FINGER RELEASE Left    middle finger   UPPER GASTROINTESTINAL ENDOSCOPY  1991    Current Medications: Current Meds  Medication Sig   amLODipine (NORVASC) 10 MG tablet TAKE 1 TABLET BY MOUTH EVERY DAY   aspirin 81 MG EC tablet Take 1 tablet (81 mg total) by mouth daily.   atorvastatin (LIPITOR) 80 MG tablet TAKE 80 mg TABLET BY MOUTH EVERY DAY   clopidogrel (PLAVIX) 75 MG tablet Take 1 tablet (75 mg total) by mouth daily.   cyclobenzaprine (FLEXERIL) 5 MG tablet TAKE 1 TABLET BY MOUTH EVERY 8 HOURS AS NEEDED FOR MUSCLE SPASMS   indapamide (LOZOL) 2.5 MG tablet TAKE 1 TABLET BY MOUTH EVERY DAY   metoprolol tartrate (LOPRESSOR) 100 MG tablet Take 1 tablet (100 mg total) by mouth 2 (two) times daily.   nitroGLYCERIN (NITROSTAT) 0.4 MG SL tablet Place 1 tablet (0.4 mg total) under the tongue every 5 (five)  minutes as needed for chest pain.   Current Facility-Administered Medications for the 10/12/18 encounter (Office Visit) with Berton Bon, NP  Medication   acetaminophen (TYLENOL) tablet 975 mg     Allergies:   Ace inhibitors and Hydrochlorothiazide   Social History   Socioeconomic History   Marital status: Single    Spouse name: Not on file   Number of children: Not on file   Years of education: Not on file   Highest education level: Not on file  Occupational History   Not on file  Social Needs   Financial resource strain: Not on file   Food insecurity    Worry: Not on file     Inability: Not on file   Transportation needs    Medical: Not on file    Non-medical: Not on file  Tobacco Use   Smoking status: Current Every Day Smoker    Packs/day: 0.25    Years: 30.00    Pack years: 7.50    Types: Cigarettes   Smokeless tobacco: Never Used   Tobacco comment: 8-10 cigarettes per day  Substance and Sexual Activity   Alcohol use: No    Alcohol/week: 0.0 standard drinks   Drug use: No   Sexual activity: Not Currently    Birth control/protection: Post-menopausal, Abstinence  Lifestyle   Physical activity    Days per week: Not on file    Minutes per session: Not on file   Stress: Not on file  Relationships   Social connections    Talks on phone: Not on file    Gets together: Not on file    Attends religious service: Not on file    Active member of club or organization: Not on file    Attends meetings of clubs or organizations: Not on file    Relationship status: Not on file  Other Topics Concern   Not on file  Social History Narrative   Not on file     Family History: The patient's family history includes Breast cancer in her maternal aunt; Cancer in her maternal aunt and mother; Diabetes in her father and mother; Hypertension in her brother, father, and sister; Ovarian cancer in her maternal aunt. ROS:   Please see the history of present illness.     All other systems reviewed and are negative.  EKGs/Labs/Other Studies Reviewed:    The following studies were reviewed today:  Left Heart Cath and Coronary Angiography 07/16/2014  Conclusion   Prox RCA lesion, 30% stenosed.  Mid RCA lesion, 60% stenosed.  RPDA lesion, 40% stenosed.  Post Atrio lesion, 20% stenosed.  Dist RCA lesion, 20% stenosed.  Prox Cx to Mid Cx lesion, 20% stenosed.  1st Mrg lesion, 20% stenosed.  2nd Mrg lesion, 40% stenosed.  Ost 3rd Mrg to 3rd Mrg lesion, 50% stenosed.  Ost LAD to Prox LAD lesion, 30% stenosed.  Mid LAD lesion, 30%  stenosed.  Dist LAD lesion, 60% stenosed.   1. Diffuse moderate triple vessel CAD without focal culprit lesion for NSTEMI.  2. Moderate stenosis mid RCA which does not appear to be a ruptured plaque and is not flow limiting 3. Moderate stenosis in the small caliber distal LAD which does not appear to be a ruptured plaque and is not flow limiting. 4. Moderate disease in the small caliber third obtuse marginal branch 5. Normal LV function 6. NSTEMI with diffuse moderate CAD but no focal culprit lesion  Recommendations: Will manage with medical therapy. I will load with Plavix  today. She will need at least 3 months of dual anti-platelet therapy with ASA and Plavix given presentation as acute coronary syndrome. Continue beta blocker and statin. Aggressive risk factor modification including weight loss, diet changes, control of lipids and BP. Would monitor x 12-24 more hours. Follow up after discharge with Dr. Delton SeeNelson.       EKG:  EKG is ordered today.  The ekg ordered today demonstrates sinus bradycardia with sinus arrhythmia, 52 bpm, no significant changes from prior tracings  Recent Labs: 03/08/2018: ALT 8; BUN 25; Creatinine, Ser 1.33; Potassium 5.0; Sodium 138   Recent Lipid Panel    Component Value Date/Time   CHOL 194 03/08/2018 1519   TRIG 129 03/08/2018 1519   HDL 47 03/08/2018 1519   CHOLHDL 4.1 03/08/2018 1519   CHOLHDL 4.3 07/15/2014 0600   VLDL 23 07/15/2014 0600   LDLCALC 121 (H) 03/08/2018 1519    Physical Exam:    VS:  BP 126/80    Pulse (!) 52    Ht 5\' 6"  (1.676 m)    Wt 281 lb 6.4 oz (127.6 kg)    SpO2 96%    BMI 45.42 kg/m     Wt Readings from Last 3 Encounters:  10/12/18 281 lb 6.4 oz (127.6 kg)  03/08/18 (!) 312 lb (141.5 kg)  02/01/18 (!) 310 lb 3.2 oz (140.7 kg)     Physical Exam  Constitutional: She is oriented to person, place, and time. She appears well-developed and well-nourished. No distress.  Morbidly obese female  HENT:  Head: Normocephalic and  atraumatic.  Neck: Normal range of motion. Neck supple. No JVD present.  Cardiovascular: Regular rhythm, normal heart sounds and intact distal pulses. Bradycardia present. Exam reveals no gallop and no friction rub.  No murmur heard. Pulmonary/Chest: Effort normal and breath sounds normal. No respiratory distress.  Expiratory wheezes and scattered rhonchi.  Patient with nonproductive cough.  Abdominal: Soft. Bowel sounds are normal.  Musculoskeletal: Normal range of motion.        General: No edema.  Neurological: She is alert and oriented to person, place, and time.  Skin: Skin is warm.  Psychiatric: She has a normal mood and affect. Her behavior is normal. Judgment and thought content normal.  Vitals reviewed.   ASSESSMENT:    1. Coronary artery disease involving native coronary artery of native heart without angina pectoris   2. Essential (primary) hypertension   3. Hyperlipidemia, unspecified hyperlipidemia type   4. Morbid obesity (HCC)   5. TOBACCO DEPENDENCE   6. PAD (peripheral artery disease) (HCC)    PLAN:    In order of problems listed above:  CAD -Mild-moderate diffuse CAD by cath in 2016, being treated medically with aspirin 81 mg, Plavix 75 mg, high intensity statin and beta-blocker. -Patient is not having any anginal symptoms. -She has multiple uncontrolled risk factors including obesity, smoking, uncontrolled hyperlipidemia, sedentary lifestyle and very poor diet -We discussed risk factor modification.  She does not have any confidence that she can improve her diet or increase her physical activity.  Encouragement provided.  Hypertension -On amlodipine 10 mg, metoprolol tartrate 100 mg twice daily -Blood pressure well controlled -Labs in January indicated impaired renal function with SCr 1.33.  Will recheck BMP.  Hyperlipidemia, Goal <70 -On atorvastatin 80 mg daily -LDL has been elevated, 121 in 03/2018.  -The patient states compliance with her statin, other  than missing an occasional dose.  I would like to repeat her lipid panel in the fasting  state and if LDL still not to goal I will refer her to lipid clinic for consideration of PCSK9 inhibitor therapy  Morbid obesity -Body mass index is 45.42 kg/m.  -She has lost 20 pounds although she denies trying.  She says she just does not feel like eating. -Patient states that she does not eat much as she does not like to prepare food but it seems like what she eats are not good choices. -Discussed diet and exercise  Tobacco abuse -up to half PPD. Worse with stress.  -Patient strongly encouraged on cessation in setting of CAD and PVD, however she does express a desire for cessation.  She has little confidence that she can quit.  Encouragement provided.  PAD with claudication -Patient is unable to walk far due to leg pain -In 2016 she was seen by Dr. Allyson Sabal and lower extremity arterial ultrasound showed ostial occlusion of the left SFA, reconstituting in the distal thigh via collaterals.  Abdominal aortogram done by [facility] showed flush occlusion of the left superficial femoral artery with reconstitution of the above-the-knee popliteal artery and single-vessel runoff via the posterior tibial artery. -I explained to the patient that her medical treatment for CAD also targets her PAD and that actually walking will help to promote collateral circulation.  She should also stop smoking.  Medication Adjustments/Labs and Tests Ordered: Current medicines are reviewed at length with the patient today.  Concerns regarding medicines are outlined above. Labs and tests ordered and medication changes are outlined in the patient instructions below:  Patient Instructions  Medication Instructions:  Your physician recommends that you continue on your current medications as directed. Please refer to the Current Medication list given to you today.  If you need a refill on your cardiac medications before your next appointment,  please call your pharmacy.   Lab work: Your physician recommends that you return for a FASTING lipid profile and basic metabolic panel on 10/17/2018 (Lab is open from 7:30 AM to 4:30 PM)   If you have labs (blood work) drawn today and your tests are completely normal, you will receive your results only by:  MyChart Message (if you have MyChart) OR  A paper copy in the mail If you have any lab test that is abnormal or we need to change your treatment, we will call you to review the results.  Testing/Procedures: None   Follow-Up: At [facility], you and your health needs are our priority.  As part of our continuing mission to provide you with exceptional heart care, we have created designated Provider Care Teams.  These Care Teams include your primary Cardiologist (physician) and Advanced Practice Providers (APPs -  Physician Assistants and Nurse Practitioners) who all work together to provide you with the care you need, when you need it. You will need a follow up appointment in:  12 months.  Please call our office 2 months in advance to schedule this appointment.  You may see Kristeen Miss, MD or one of the following Advanced Practice Providers on your designated Care Team: Tereso Newcomer, PA-C Vin Devon, [place]  Berton Bon, NP  Any Other Special Instructions Will Be Listed Below (If Applicable).   Lifestyle Modifications to Prevent and Treat Heart Disease -Recommend heart healthy/Mediterranean diet, with whole grains, fruits, vegetables, fish, lean meats, nuts, olive oil and avocado oil.  -Limit salt intake to less than 1500 mg per day.  -Recommend moderate walking, starting slowly with a few minutes and working up to 3-5 times/week for 30-50  minutes each session. Aim for at least 150 minutes.week. Goal should be pace of 3 miles/hours, or walking 1.5 miles in 30 minutes -Recommend avoidance of tobacco products. Avoid excess alcohol. -Keep blood pressure well controlled, ideally  less than 130/80.   ======================================================   Preventing Health Risks of Being Overweight Maintaining a healthy body weight is an important part of your overall health. Your healthy body weight depends on your age, gender, and height. Being overweight puts you at risk for many health problems, including:  Heart disease.  Diabetes.  Problems sleeping.  Joint problems. You can make changes to your diet and lifestyle to prevent these risks. Consider working with a health care provider or a dietitian to make these changes. What nutrition changes can be made?   Eat only as much as your body needs. In most cases, this is about 2,000 calories a day, but the amount varies depending on your height, gender, and activity level. Ask your health care provider how many calories you should have each day. Eating more than your body needs on a regular basis can cause you to become overweight or obese.  Eat slowly, and stop eating when you feel full.  Choose healthy foods, including: ? Fruits and vegetables. ? Lean meats. ? Low-fat dairy products. ? High-fiber foods, such as whole grains and beans. ? Healthy snacks like vegetable sticks, a piece of fruit, or a small amount of yogurt or cheese.  Avoid foods and drinks that are high in sugar, salt (sodium), saturated fat, or trans fat. This includes: ? Many desserts such as candy, cookies, and ice cream. ? Soda. ? Fried foods. ? Processed meats such as hot dogs or lunch meats. ? Prepackaged snack foods. What lifestyle changes can be made?   Exercise for at least 150 minutes a week to prevent weight gain, or as often as recommended by your health care provider. Do moderate-intensity exercise, such as brisk walking. ? Spread it out by exercising for 30 minutes 5 days a week, or in short 10-minute bursts several times a day.  Find other ways to stay active and burn calories, such as yard work or a hobby that involves  physical activity.  Get at least 8 hours of sleep each night. When you are well-rested, you are more likely to be active and make healthy choices during the day. To sleep better: ? Try to go to bed and wake up at about the same time every day. ? Keep your bedroom dark, quiet, and cool. ? Make sure that your bed is comfortable. ? Avoid stimulating activities, such as watching television or exercising, for at least one hour before bedtime. Why are these changes important? Eating healthy and being active helps you lose weight and prevent health problems caused by being overweight. Making these changes can also help you manage stress, feel better mentally, and connect with friends and family. What can happen if changes are not made? Being overweight can affect you for your entire life. You may develop joint or bone problems that make it painful or difficult for you to play sports or do activities you enjoy. Being overweight puts stress on your heart and lungs and can lead to medical problems like diabetes, heart disease, and sleeping problems. Where to find support You can get support for preventing health risks of being overweight from:  Your health care provider or a dietitian. They can provide guidance about healthy eating and healthy lifestyle choices.  Weight loss support groups, online or  in-person. Where to find more information  MyPlate: FormerBoss.no ? This an online tool that provides personalized recommendations about foods to eat each day.  The Centers for Disease Control and Prevention: http://sharp-Willie Loy.biz/ ? This resource gives tips for managing weight and having an active lifestyle. Summary  To prevent unhealthy weight gain, it is important to maintain a healthy diet high in vegetables and whole grains, exercise regularly, and get at least 8 hours of sleep each night.  Making these changes helps prevent many long-term (chronic) health conditions that can shorten  your life, such as diabetes, heart disease, and stroke. This information is not intended to replace advice given to you by your health care provider. Make sure you discuss any questions you have with your health care provider. Document Released: 01/13/2017 Document Revised: 02/19/2017 Document Reviewed: 01/13/2017 Elsevier Patient Education  2020 [place] with Quitting Smoking  Quitting smoking is a physical and mental challenge. You will face cravings, withdrawal symptoms, and temptation. Before quitting, work with your health care provider to make a plan that can help you cope. Preparation can help you quit and keep you from giving in. How can I cope with cravings? Cravings usually last for 5-10 minutes. If you get through it, the craving will pass. Consider taking the following actions to help you cope with cravings:  Keep your mouth busy: ? Chew sugar-free gum. ? Suck on hard candies or a straw. ? Brush your teeth.  Keep your hands and body busy: ? Immediately change to a different activity when you feel a craving. ? Squeeze or play with a ball. ? Do an activity or a hobby, like making bead jewelry, practicing needlepoint, or working with wood. ? Mix up your normal routine. ? Take a short exercise break. Go for a quick walk or run up and down stairs. ? Spend time in public places where smoking is not allowed.  Focus on doing something kind or helpful for someone else.  Call a friend or family member to talk during a craving.  Join a support group.  Call a quit line, such as 1-800-QUIT-NOW.  Talk with your health care provider about medicines that might help you cope with cravings and make quitting easier for you. How can I deal with withdrawal symptoms? Your body may experience negative effects as it tries to get used to not having nicotine in the system. These effects are called withdrawal symptoms. They may include:  Feeling hungrier than normal.  Trouble  concentrating.  Irritability.  Trouble sleeping.  Feeling depressed.  Restlessness and agitation.  Craving a cigarette. To manage withdrawal symptoms:  Avoid places, people, and activities that trigger your cravings.  Remember why you want to quit.  Get plenty of sleep.  Avoid coffee and other caffeinated drinks. These may worsen some of your symptoms. How can I handle social situations? Social situations can be difficult when you are quitting smoking, especially in the first few weeks. To manage this, you can:  Avoid parties, bars, and other social situations where people might be smoking.  Avoid alcohol.  Leave right away if you have the urge to smoke.  Explain to your family and friends that you are quitting smoking. Ask for understanding and support.  Plan activities with friends or family where smoking is not an option. What are some ways I can cope with stress? Wanting to smoke may cause stress, and stress can make you want to smoke. Find ways to manage your  stress. Relaxation techniques can help. For example:  Breathe slowly and deeply, in through your nose and out through your mouth.  Listen to soothing, relaxing music.  Talk with a family member or friend about your stress.  Light a candle.  Soak in a bath or take a shower.  Think about a peaceful place. What are some ways I can prevent weight gain? Be aware that many people gain weight after they quit smoking. However, not everyone does. To keep from gaining weight, have a plan in place before you quit and stick to the plan after you quit. Your plan should include:  Having healthy snacks. When you have a craving, it may help to: ? Eat plain popcorn, crunchy carrots, celery, or other cut vegetables. ? Chew sugar-free gum.  Changing how you eat: ? Eat small portion sizes at meals. ? Eat 4-6 small meals throughout the day instead of 1-2 large meals a day. ? Be mindful when you eat. Do not watch  television or do other things that might distract you as you eat.  Exercising regularly: ? Make time to exercise each day. If you do not have time for a long workout, do short bouts of exercise for 5-10 minutes several times a day. ? Do some form of strengthening exercise, like weight lifting, and some form of aerobic exercise, like running or swimming.  Drinking plenty of water or other low-calorie or no-calorie drinks. Drink 6-8 glasses of water daily, or as much as instructed by your health care provider. Summary  Quitting smoking is a physical and mental challenge. You will face cravings, withdrawal symptoms, and temptation to smoke again. Preparation can help you as you go through these challenges.  You can cope with cravings by keeping your mouth busy (such as by chewing gum), keeping your body and hands busy, and making calls to family, friends, or a helpline for people who want to quit smoking.  You can cope with withdrawal symptoms by avoiding places where people smoke, avoiding drinks with caffeine, and getting plenty of rest.  Ask your health care provider about the different ways to prevent weight gain, avoid stress, and handle social situations. This information is not intended to replace advice given to you by your health care provider. Make sure you discuss any questions you have with your health care provider. Document Released: 02/14/2016 Document Revised: 01/29/2017 Document Reviewed: 02/14/2016 Elsevier Patient Education  2020 [employer].      Signed, Berton Bon, NP  10/12/2018 2:19 PM    [facility]

## 2018-10-12 NOTE — Patient Instructions (Addendum)
Medication Instructions:  Your physician recommends that you continue on your current medications as directed. Please refer to the Current Medication list given to you today.  If you need a refill on your cardiac medications before your next appointment, please call your pharmacy.   Lab work: Your physician recommends that you return for a FASTING lipid profile and basic metabolic panel on 10/17/2018 (Lab is open from 7:30 AM to 4:30 PM)   If you have labs (blood work) drawn today and your tests are completely normal, you will receive your results only by:  MyChart Message (if you have MyChart) OR  A paper copy in the mail If you have any lab test that is abnormal or we need to change your treatment, we will call you to review the results.  Testing/Procedures: None   Follow-Up: At [facility], you and your health needs are our priority.  As part of our continuing mission to provide you with exceptional heart care, we have created designated Provider Care Teams.  These Care Teams include your primary Cardiologist (physician) and Advanced Practice Providers (APPs -  Physician Assistants and Nurse Practitioners) who all work together to provide you with the care you need, when you need it. You will need a follow up appointment in:  12 months.  Please call our office 2 months in advance to schedule this appointment.  You may see Kristeen MissPhilip Nahser, MD or one of the following Advanced Practice Providers on your designated Care Team: Tereso NewcomerScott Weaver, PA-C Vin Lyn[place]Bhagat, [place]PA-C  Berton BonJanine Vinie Charity, NP  Any Other Special Instructions Will Be Listed Below (If Applicable).   Lifestyle Modifications to Prevent and Treat Heart Disease -Recommend heart healthy/Mediterranean diet, with whole grains, fruits, vegetables, fish, lean meats, nuts, olive oil and avocado oil.  -Limit salt intake to less than 1500 mg per day.  -Recommend moderate walking, starting slowly with a few minutes and working up to 3-5  times/week for 30-50 minutes each session. Aim for at least 150 minutes.week. Goal should be pace of 3 miles/hours, or walking 1.5 miles in 30 minutes -Recommend avoidance of tobacco products. Avoid excess alcohol. -Keep blood pressure well controlled, ideally less than 130/80.   ======================================================   Preventing Health Risks of Being Overweight Maintaining a healthy body weight is an important part of your overall health. Your healthy body weight depends on your age, gender, and height. Being overweight puts you at risk for many health problems, including:  Heart disease.  Diabetes.  Problems sleeping.  Joint problems. You can make changes to your diet and lifestyle to prevent these risks. Consider working with a health care provider or a dietitian to make these changes. What nutrition changes can be made?   Eat only as much as your body needs. In most cases, this is about 2,000 calories a day, but the amount varies depending on your height, gender, and activity level. Ask your health care provider how many calories you should have each day. Eating more than your body needs on a regular basis can cause you to become overweight or obese.  Eat slowly, and stop eating when you feel full.  Choose healthy foods, including: ? Fruits and vegetables. ? Lean meats. ? Low-fat dairy products. ? High-fiber foods, such as whole grains and beans. ? Healthy snacks like vegetable sticks, a piece of fruit, or a small amount of yogurt or cheese.  Avoid foods and drinks that are high in sugar, salt (sodium), saturated fat, or trans fat. This includes: ?  Many desserts such as candy, cookies, and ice cream. ? Soda. ? Fried foods. ? Processed meats such as hot dogs or lunch meats. ? Prepackaged snack foods. What lifestyle changes can be made?   Exercise for at least 150 minutes a week to prevent weight gain, or as often as recommended by your health care  provider. Do moderate-intensity exercise, such as brisk walking. ? Spread it out by exercising for 30 minutes 5 days a week, or in short 10-minute bursts several times a day.  Find other ways to stay active and burn calories, such as yard work or a hobby that involves physical activity.  Get at least 8 hours of sleep each night. When you are well-rested, you are more likely to be active and make healthy choices during the day. To sleep better: ? Try to go to bed and wake up at about the same time every day. ? Keep your bedroom dark, quiet, and cool. ? Make sure that your bed is comfortable. ? Avoid stimulating activities, such as watching television or exercising, for at least one hour before bedtime. Why are these changes important? Eating healthy and being active helps you lose weight and prevent health problems caused by being overweight. Making these changes can also help you manage stress, feel better mentally, and connect with friends and family. What can happen if changes are not made? Being overweight can affect you for your entire life. You may develop joint or bone problems that make it painful or difficult for you to play sports or do activities you enjoy. Being overweight puts stress on your heart and lungs and can lead to medical problems like diabetes, heart disease, and sleeping problems. Where to find support You can get support for preventing health risks of being overweight from:  Your health care provider or a dietitian. They can provide guidance about healthy eating and healthy lifestyle choices.  Weight loss support groups, online or in-person. Where to find more information  MyPlate: https://ball-collins.biz/www.choosemyplate.gov ? This an online tool that provides personalized recommendations about foods to eat each day.  The Centers for Disease Control and Prevention: AffordableScrapbook.glwww.cdc.gov/healthyweight ? This resource gives tips for managing weight and having an active lifestyle. Summary  To  prevent unhealthy weight gain, it is important to maintain a healthy diet high in vegetables and whole grains, exercise regularly, and get at least 8 hours of sleep each night.  Making these changes helps prevent many long-term (chronic) health conditions that can shorten your life, such as diabetes, heart disease, and stroke. This information is not intended to replace advice given to you by your health care provider. Make sure you discuss any questions you have with your health care provider. Document Released: 01/13/2017 Document Revised: 02/19/2017 Document Reviewed: 01/13/2017 Elsevier Patient Education  2020 [employer]    Coping with Quitting Smoking  Quitting smoking is a physical and mental challenge. You will face cravings, withdrawal symptoms, and temptation. Before quitting, work with your health care provider to make a plan that can help you cope. Preparation can help you quit and keep you from giving in. How can I cope with cravings? Cravings usually last for 5-10 minutes. If you get through it, the craving will pass. Consider taking the following actions to help you cope with cravings:  Keep your mouth busy: ? Chew sugar-free gum. ? Suck on hard candies or a straw. ? Brush your teeth.  Keep your hands and body busy: ? Immediately change to a different activity  when you feel a craving. ? Squeeze or play with a ball. ? Do an activity or a hobby, like making bead jewelry, practicing needlepoint, or working with wood. ? Mix up your normal routine. ? Take a short exercise break. Go for a quick walk or run up and down stairs. ? Spend time in public places where smoking is not allowed.  Focus on doing something kind or helpful for someone else.  Call a friend or family member to talk during a craving.  Join a support group.  Call a quit line, such as 1-800-QUIT-NOW.  Talk with your health care provider about medicines that might help you cope with cravings and make  quitting easier for you. How can I deal with withdrawal symptoms? Your body may experience negative effects as it tries to get used to not having nicotine in the system. These effects are called withdrawal symptoms. They may include:  Feeling hungrier than normal.  Trouble concentrating.  Irritability.  Trouble sleeping.  Feeling depressed.  Restlessness and agitation.  Craving a cigarette. To manage withdrawal symptoms:  Avoid places, people, and activities that trigger your cravings.  Remember why you want to quit.  Get plenty of sleep.  Avoid coffee and other caffeinated drinks. These may worsen some of your symptoms. How can I handle social situations? Social situations can be difficult when you are quitting smoking, especially in the first few weeks. To manage this, you can:  Avoid parties, bars, and other social situations where people might be smoking.  Avoid alcohol.  Leave right away if you have the urge to smoke.  Explain to your family and friends that you are quitting smoking. Ask for understanding and support.  Plan activities with friends or family where smoking is not an option. What are some ways I can cope with stress? Wanting to smoke may cause stress, and stress can make you want to smoke. Find ways to manage your stress. Relaxation techniques can help. For example:  Breathe slowly and deeply, in through your nose and out through your mouth.  Listen to soothing, relaxing music.  Talk with a family member or friend about your stress.  Light a candle.  Soak in a bath or take a shower.  Think about a peaceful place. What are some ways I can prevent weight gain? Be aware that many people gain weight after they quit smoking. However, not everyone does. To keep from gaining weight, have a plan in place before you quit and stick to the plan after you quit. Your plan should include:  Having healthy snacks. When you have a craving, it may help  to: ? Eat plain popcorn, crunchy carrots, celery, or other cut vegetables. ? Chew sugar-free gum.  Changing how you eat: ? Eat small portion sizes at meals. ? Eat 4-6 small meals throughout the day instead of 1-2 large meals a day. ? Be mindful when you eat. Do not watch television or do other things that might distract you as you eat.  Exercising regularly: ? Make time to exercise each day. If you do not have time for a long workout, do short bouts of exercise for 5-10 minutes several times a day. ? Do some form of strengthening exercise, like weight lifting, and some form of aerobic exercise, like running or swimming.  Drinking plenty of water or other low-calorie or no-calorie drinks. Drink 6-8 glasses of water daily, or as much as instructed by your health care provider. Summary  Quitting smoking is  a physical and mental challenge. You will face cravings, withdrawal symptoms, and temptation to smoke again. Preparation can help you as you go through these challenges.  You can cope with cravings by keeping your mouth busy (such as by chewing gum), keeping your body and hands busy, and making calls to family, friends, or a helpline for people who want to quit smoking.  You can cope with withdrawal symptoms by avoiding places where people smoke, avoiding drinks with caffeine, and getting plenty of rest.  Ask your health care provider about the different ways to prevent weight gain, avoid stress, and handle social situations. This information is not intended to replace advice given to you by your health care provider. Make sure you discuss any questions you have with your health care provider. Document Released: 02/14/2016 Document Revised: 01/29/2017 Document Reviewed: 02/14/2016 Elsevier Patient Education  2020 [employer]

## 2018-10-14 ENCOUNTER — Ambulatory Visit: Payer: Medicaid Other | Admitting: Student in an Organized Health Care Education/Training Program

## 2018-10-17 ENCOUNTER — Other Ambulatory Visit: Payer: Self-pay

## 2018-10-17 ENCOUNTER — Other Ambulatory Visit: Payer: Medicaid Other | Admitting: *Deleted

## 2018-10-17 DIAGNOSIS — E785 Hyperlipidemia, unspecified: Secondary | ICD-10-CM

## 2018-10-17 DIAGNOSIS — I10 Essential (primary) hypertension: Secondary | ICD-10-CM

## 2018-10-17 LAB — BASIC METABOLIC PANEL
BUN/Creatinine Ratio: 21 (ref 12–28)
BUN: 34 mg/dL — ABNORMAL HIGH (ref 8–27)
CO2: 21 mmol/L (ref 20–29)
Calcium: 9.4 mg/dL (ref 8.7–10.3)
Chloride: 101 mmol/L (ref 96–106)
Creatinine, Ser: 1.62 mg/dL — ABNORMAL HIGH (ref 0.57–1.00)
GFR calc Af Amer: 39 mL/min/{1.73_m2} — ABNORMAL LOW (ref >59)
GFR calc non Af Amer: 34 mL/min/{1.73_m2} — ABNORMAL LOW (ref >59)
Glucose: 136 mg/dL — ABNORMAL HIGH (ref 65–99)
Potassium: 4 mmol/L (ref 3.5–5.2)
Sodium: 138 mmol/L (ref 134–144)

## 2018-10-17 LAB — LIPID PANEL
Chol/HDL Ratio: 3.6 ratio (ref 0.0–4.4)
Cholesterol, Total: 185 mg/dL (ref 100–199)
HDL: 51 mg/dL (ref >39)
LDL Calculated: 112 mg/dL — ABNORMAL HIGH (ref 0–99)
Triglycerides: 109 mg/dL (ref 0–149)
VLDL Cholesterol Cal: 22 mg/dL (ref 5–40)

## 2018-10-20 ENCOUNTER — Telehealth: Payer: Self-pay

## 2018-10-20 DIAGNOSIS — E782 Mixed hyperlipidemia: Secondary | ICD-10-CM

## 2018-10-20 DIAGNOSIS — I10 Essential (primary) hypertension: Secondary | ICD-10-CM

## 2018-10-20 NOTE — Telephone Encounter (Signed)
Pt aware of results. Pt is aware to decrease Indapamide to 2.5 mg every other day. Pt will come in for repeat labs on 11/21/2018. Pt is aware that she has been referred to the lipid clinic and someone will contact her with an appointment.

## 2018-10-20 NOTE — Telephone Encounter (Signed)
-----   Message from Daune Perch, NP sent at 10/20/2018  3:43 PM EDT ----- BUN and creatinine (kidneys) are a little worse than prior. May be related to indapamide, decrease dose to 2.5 mg every other day. Recheck BMet in 1 month.  Cholesterol is still not to goal. LDL is 112 and should be less than 70. Since she is on max statin dose, please refer her to the lipid clinic for consideration of other strategy like PCSK9-I.   Daune Perch, NP

## 2018-10-21 NOTE — Telephone Encounter (Signed)
° ° °  Patient has additional questions from 8/20 telephone note

## 2018-10-24 NOTE — Telephone Encounter (Signed)
Spoke with pt. Questions addressed

## 2018-11-04 ENCOUNTER — Ambulatory Visit: Payer: Medicaid Other | Admitting: Student in an Organized Health Care Education/Training Program

## 2018-11-04 ENCOUNTER — Other Ambulatory Visit: Payer: Self-pay

## 2018-11-04 ENCOUNTER — Other Ambulatory Visit: Payer: Self-pay | Admitting: Student in an Organized Health Care Education/Training Program

## 2018-11-04 VITALS — BP 140/80 | HR 78 | Ht 66.0 in | Wt 277.0 lb

## 2018-11-04 DIAGNOSIS — G8929 Other chronic pain: Secondary | ICD-10-CM | POA: Diagnosis not present

## 2018-11-04 DIAGNOSIS — I10 Essential (primary) hypertension: Secondary | ICD-10-CM | POA: Diagnosis not present

## 2018-11-04 DIAGNOSIS — M546 Pain in thoracic spine: Secondary | ICD-10-CM

## 2018-11-04 NOTE — Patient Instructions (Signed)
It was a pleasure to see you today!  To summarize our discussion for this visit:  I am sending in a referral for physical therapy to help improve your back pain  I will refill your prescriptions. Thank you for coming in today to discuss those with me.   Continue to follow up with your heart doctor and lipid clinic  We will recheck your blood pressure at your next visit for changes with the new medication taking every other day.  Some additional health maintenance measures we should update are: Health Maintenance Due  Topic Date Due  . Hepatitis C Screening  1955/06/27  . COLONOSCOPY  07/11/2006  . TETANUS/TDAP  06/14/2016  . INFLUENZA VACCINE  10/01/2018  .    Please return to our clinic to see me in about 6 weeks for follow up of your back pain.  Call the clinic at [phone] if your symptoms worsen or you have any concerns.   Thank you for allowing me to take part in your care,  Dr. Doristine Mango

## 2018-11-04 NOTE — Progress Notes (Signed)
Subjective:    Patient ID: Tammy [name], female    DOB: 03/04/1955, 63 y.o.   MRN: [id number]   CC: meet new provider  HPI:  Patient presents today to meet new provider.  She has specific questions about refilling her Flexeril for back spasms. Patient states that she has had back spasms and pain since her cesarean about 37 years ago.  The pain has become worse in the last year and is debilitating when it occurs.  She has a mild amount of back pain chronically in the lumbar area but occasionally will have increased pain in her thoracic area which is the worst.  Patient states that approximately 1-3 times per month she will have a occurrence of muscle spasm in her thoracic bilateral back which is so intense that she cannot move for several seconds.  She denies having any history of injury or accidents to this area.  There are no specific motions or positions that exacerbate the condition.  She states that the pain typically comes on when she is at rest.  The pain is improved with her Flexeril which kicks in after a couple of minutes and relieves the pain completely.  The pain is also improved with taking hot showers.  She was recommended to take physical therapy in the past but has never tried it as she feels like she is always in pain and it would be painful to do the physical therapy.  She also has questions about indapamide and its effect on her kidneys. Patient was given indapamide by previous PCP and told to take daily.  Patient stopped taking the medication when she went to her heart doctor and they told her it could be affecting her kidneys.  Her cardiologist recommended taking every other day but patient has been worried about her kidneys so have not been taking the medication.  Her blood pressure today is 140/80.  She states that it is normally lower than that at home.  Patient denies any lower extremity swelling.  Smoking status reviewed   ROS: pertinent noted in the HPI   Past  medical history, surgical, family, and social history reviewed and updated in the EMR as appropriate.  Objective:  BP 140/80   Pulse 78   Ht 5\' 6"  (1.676 m)   Wt 277 lb (125.6 kg)   SpO2 99%   BMI 44.71 kg/m   Vitals and nursing note reviewed  General: NAD, pleasant, able to participate in exam Cardiac: RRR, S1 S2 present. normal heart sounds, no murmurs. Respiratory: CTAB, normal effort, No wheezes, rales or rhonchi Extremities: no edema or cyanosis. Back: Palpation along spine resulted in no tenderness from C1 to sacrum.  Patient endorses the location of the pain is in the paraspinal musculature and area of T6-T12 and is equal bilaterally.  When palpating this musculature there was no tenderness, no asymmetry, no bogginess, no erythema, no increased tone. Skin: warm and dry, no rashes noted Neuro: alert, no obvious focal deficits Psych: Normal affect and mood   Assessment & Plan:    HYPERTENSION, BENIGN SYSTEMIC Moderately well controlled today in office 140/80 Reviewed recent cardiology note for guidance in recommended medications. Reviewed the medication list with patient. Recommended she continue the medications as recommended by cardiologist which include amlodipine, Plavix, aspirin, metoprolol, indapamide every other day.  Patient continue to take atorvastatin daily and has an appointment at lipid clinic for possible initiation of PCSK9 -Has close follow-up with cardiology to recheck renal function  Bilateral  thoracic back pain Stable Discussed with patient refilling Flexeril in conjunction with referral to physical therapy Provided patient with instructions for back extension stretches and core strengthening exercises Patient reluctantly agreed to plan to be evaluated by physical therapy-referral placed Follow-up in about 6 weeks after treatment   Jamelle Rushinghelsey Tammy Kocsis, DO [facility] PGY-2

## 2018-11-05 DIAGNOSIS — M546 Pain in thoracic spine: Secondary | ICD-10-CM | POA: Insufficient documentation

## 2018-11-05 MED ORDER — CYCLOBENZAPRINE HCL 5 MG PO TABS: ORAL_TABLET | ORAL | 1 refills | Status: DC

## 2018-11-05 NOTE — Assessment & Plan Note (Signed)
Stable Discussed with patient refilling Flexeril in conjunction with referral to physical therapy Provided patient with instructions for back extension stretches and core strengthening exercises Patient reluctantly agreed to plan to be evaluated by physical therapy-referral placed Follow-up in about 6 weeks after treatment

## 2018-11-05 NOTE — Assessment & Plan Note (Addendum)
Moderately well controlled today in office 140/80 Reviewed recent cardiology note for guidance in recommended medications. Reviewed the medication list with patient. Recommended she continue the medications as recommended by cardiologist which include amlodipine, Plavix, aspirin, metoprolol, indapamide every other day.  Patient continue to take atorvastatin daily and has an appointment at lipid clinic for possible initiation of PCSK9 -Has close follow-up with cardiology to recheck renal function

## 2018-11-06 ENCOUNTER — Other Ambulatory Visit: Payer: Self-pay | Admitting: Student in an Organized Health Care Education/Training Program

## 2018-11-06 DIAGNOSIS — I739 Peripheral vascular disease, unspecified: Secondary | ICD-10-CM

## 2018-11-08 MED ORDER — METOPROLOL TARTRATE 100 MG PO TABS: 100.0000 mg | ORAL_TABLET | Freq: Two times a day (BID) | ORAL | 6 refills | Status: DC

## 2018-11-09 ENCOUNTER — Other Ambulatory Visit: Payer: Self-pay

## 2018-11-09 MED ORDER — AMLODIPINE BESYLATE 10 MG PO TABS: 10.0000 mg | ORAL_TABLET | Freq: Every day | ORAL | 1 refills | Status: DC

## 2018-11-14 ENCOUNTER — Ambulatory Visit: Payer: Medicaid Other | Admitting: Physical Therapy

## 2018-11-21 ENCOUNTER — Other Ambulatory Visit: Payer: Medicaid Other

## 2018-11-23 ENCOUNTER — Other Ambulatory Visit: Payer: Medicaid Other | Admitting: *Deleted

## 2018-11-23 ENCOUNTER — Other Ambulatory Visit: Payer: Self-pay

## 2018-11-23 ENCOUNTER — Encounter: Payer: Self-pay | Admitting: Pharmacist

## 2018-11-23 ENCOUNTER — Ambulatory Visit: Payer: Medicaid Other | Admitting: Pharmacist

## 2018-11-23 DIAGNOSIS — I10 Essential (primary) hypertension: Secondary | ICD-10-CM | POA: Diagnosis not present

## 2018-11-23 DIAGNOSIS — F17200 Nicotine dependence, unspecified, uncomplicated: Secondary | ICD-10-CM

## 2018-11-23 DIAGNOSIS — E782 Mixed hyperlipidemia: Secondary | ICD-10-CM | POA: Diagnosis not present

## 2018-11-23 LAB — BASIC METABOLIC PANEL
BUN/Creatinine Ratio: 15 (ref 12–28)
BUN: 23 mg/dL (ref 8–27)
CO2: 19 mmol/L — ABNORMAL LOW (ref 20–29)
Calcium: 10 mg/dL (ref 8.7–10.3)
Chloride: 104 mmol/L (ref 96–106)
Creatinine, Ser: 1.53 mg/dL — ABNORMAL HIGH (ref 0.57–1.00)
GFR calc Af Amer: 42 mL/min/{1.73_m2} — ABNORMAL LOW (ref >59)
GFR calc non Af Amer: 36 mL/min/{1.73_m2} — ABNORMAL LOW (ref >59)
Glucose: 143 mg/dL — ABNORMAL HIGH (ref 65–99)
Potassium: 4 mmol/L (ref 3.5–5.2)
Sodium: 140 mmol/L (ref 134–144)

## 2018-11-23 MED ORDER — EZETIMIBE 10 MG PO TABS: 10.0000 mg | ORAL_TABLET | Freq: Every day | ORAL | 3 refills | Status: DC

## 2018-11-23 NOTE — Progress Notes (Signed)
Patient ID: Tammy [name]                 DOB: [birth year]                    MRN: [id number]     HPI: Tammy [name] is a 63 y.o. female patient of Dr. Harvie Bridge referred to lipid clinic by Adam Phenix, NP. PMH is significant for CAD (NSTEMI in 2016 w. persevered EF), hypertension, PVD, GERD, hyperlipidemia, tobacco abuse and morbid obesity.  Patient presents to clinic today to discuss options for cholesterol treatment. However patients main concern today was her kidney function and not wanting to take indapamide. States she has been taking but sporadically because they scared her when they said her kidney numbers were up. Patient was visibly upset. Patient also talks a lot about her lack of appetite, how she has changed her diet a lot, but just doesn't want to eat. She states that she is a high stress person. She use to combat her stress with eating, but now she doesn't want to eat. She smokes to relieve stress. She says she use to walk to relieve stress but now her legs hurt so bad when she walks she only walks once a week.   Current Medications: atorvastatin 80mg  daily Risk Factors: CAD,PVD LDL goal: <70  Diet: does not cook,"cannot afford to eat healthy"- cut out bread (but said she eats toast for breakfast), cut out cheese and fried food. Has increased her vegetables italian sausage for breakfast  Exercise: does not due to PVD leg pain, walks once a week  Family History: The patient's family history includes Breast cancer in her maternal aunt; Cancer in her maternal aunt and mother; Diabetes in her father and mother; Hypertension in her brother, father, and sister; Ovarian cancer in her maternal aunt.  Social History: smoker   Labs: TC 185, TG 109, HDL 51, LDL 112  Past Medical History:  Diagnosis Date  . Abnormal mammogram 10.25.11   area of density with several adjacent amorphous calcifications located laterally w/in the right breast at approx 9-10 o'clock position likely  represents area of evolving far necrosis; f/u mamo and U/S in 6 months  . CAD (coronary artery disease)    a. 07/2014 NSTEMI/Cath: LM nl, LAD 30ost/m, 60d, LCX 20p, OM1 20, OM2 40, OM3 50, RCA 30p, 81m, 20d, RPDA 40, EF 65%-->Med Rx;  b. 07/2014 Echo: EF 55-60%, no rwma, mildly dil LA.  . Essential hypertension   . GERD (gastroesophageal reflux disease)   . Hyperlipidemia   . Morbid obesity (HCC)   . PAD (peripheral artery disease) (HCC)    a. 07/2014 ABI's: R 0.88, L 0.32.  08/2014 Ulcer     Current Outpatient Medications on File Prior to Visit  Medication Sig Dispense Refill  . amLODipine (NORVASC) 10 MG tablet Take 1 tablet (10 mg total) by mouth daily. 90 tablet 1  . aspirin 81 MG EC tablet Take 1 tablet (81 mg total) by mouth daily. 30 tablet 6  . atorvastatin (LIPITOR) 80 MG tablet TAKE 80 mg TABLET BY MOUTH EVERY DAY 30 tablet 3  . clopidogrel (PLAVIX) 75 MG tablet TAKE 1 TABLET BY MOUTH EVERY DAY 30 tablet 0  . cyclobenzaprine (FLEXERIL) 5 MG tablet TAKE 1 TABLET BY MOUTH EVERY 8 HOURS AS NEEDED FOR MUSCLE SPASMS 15 tablet 1  . indapamide (LOZOL) 2.5 MG tablet Take 2.5 mg by mouth every other day.    . metoprolol tartrate (  LOPRESSOR) 100 MG tablet Take 1 tablet (100 mg total) by mouth 2 (two) times daily. 60 tablet 6  . nitroGLYCERIN (NITROSTAT) 0.4 MG SL tablet Place 1 tablet (0.4 mg total) under the tongue every 5 (five) minutes as needed for chest pain. 25 tablet 6   Current Facility-Administered Medications on File Prior to Visit  Medication Dose Route Frequency Provider Last Rate Last Dose  . acetaminophen (TYLENOL) tablet 975 mg  975 mg Oral Once Eloise Levels, MD        Allergies  Allergen Reactions  . Ace Inhibitors     REACTION: rash on breast  . Hydrochlorothiazide     REACTION: "rash on breast and breast pain"    Assessment/Plan:  1. Hyperlipidemia - LDL is above goal of <70. We discussed Repatha and its cardiovascular protection. However patient is not interested  in an injection. She is scared about how she would have to give herself shots every 2 weeks for the rest of her life. Patient would prefer to take a pill. Discussed Zetia with patient. Discussed that it does have cardiovascular protection data, but that it may not get her LDL to goal. Patient would like to try zetia and diet. Will start zetia 10mg  daily. Repeat lipid panel in 3 months.  Also discussed diet and exercise- see patient instructions on AVS  2. Tobacco abuse- discussed the detrimental effects of smoking on her PVD and encouraged her to find a healthier habit to deal with stress. Patient not interested in quitting at this time.   Thank you,  Ramond Dial, Pharm.D, [place]  [address] [place], [place] [address]  Phone: [phone]; Fax: [phone]

## 2018-11-23 NOTE — Patient Instructions (Signed)
It was a pleasure to meet you.  Start taking Zetia 10mg  daily. Continue to take atorvastatin 80mg  daily.  Continue to work on your diet improvements  Try to slowly increase your walking. If you can only walk 10 min your legs start to hurt, then try walking 51min 2 times a day. Slowly increase the amount of days you walk.  We will repeat lipid panel in 3 months

## 2018-11-24 ENCOUNTER — Telehealth: Payer: Self-pay

## 2018-11-24 ENCOUNTER — Encounter: Payer: Self-pay | Admitting: Physical Therapy

## 2018-11-24 ENCOUNTER — Telehealth: Payer: Self-pay | Admitting: Cardiovascular Disease

## 2018-11-24 ENCOUNTER — Ambulatory Visit: Payer: Medicaid Other | Attending: Family Medicine | Admitting: Physical Therapy

## 2018-11-24 ENCOUNTER — Other Ambulatory Visit: Payer: Medicaid Other | Admitting: *Deleted

## 2018-11-24 DIAGNOSIS — E782 Mixed hyperlipidemia: Secondary | ICD-10-CM

## 2018-11-24 DIAGNOSIS — M545 Low back pain, unspecified: Secondary | ICD-10-CM

## 2018-11-24 DIAGNOSIS — Z6841 Body Mass Index (BMI) 40.0 and over, adult: Secondary | ICD-10-CM | POA: Diagnosis not present

## 2018-11-24 DIAGNOSIS — R739 Hyperglycemia, unspecified: Secondary | ICD-10-CM | POA: Diagnosis not present

## 2018-11-24 DIAGNOSIS — R293 Abnormal posture: Secondary | ICD-10-CM | POA: Diagnosis not present

## 2018-11-24 DIAGNOSIS — G8929 Other chronic pain: Secondary | ICD-10-CM | POA: Diagnosis not present

## 2018-11-24 LAB — HEPATIC FUNCTION PANEL
ALT: 8 IU/L (ref 0–32)
AST: 17 IU/L (ref 0–40)
Albumin: 4.4 g/dL (ref 3.8–4.8)
Alkaline Phosphatase: 113 IU/L (ref 39–117)
Bilirubin Total: 0.5 mg/dL (ref 0.0–1.2)
Bilirubin, Direct: 0.15 mg/dL (ref 0.00–0.40)
Total Protein: 7.4 g/dL (ref 6.0–8.5)

## 2018-11-24 LAB — LIPID PANEL
Chol/HDL Ratio: 3.3 ratio (ref 0.0–4.4)
Cholesterol, Total: 174 mg/dL (ref 100–199)
HDL: 52 mg/dL (ref >39)
LDL Chol Calc (NIH): 100 mg/dL — ABNORMAL HIGH (ref 0–99)
Triglycerides: 123 mg/dL (ref 0–149)
VLDL Cholesterol Cal: 22 mg/dL (ref 5–40)

## 2018-11-24 LAB — HEMOGLOBIN A1C
Est. average glucose Bld gHb Est-mCnc: 105 mg/dL
Hgb A1c MFr Bld: 5.3 % (ref 4.8–5.6)

## 2018-11-24 NOTE — Telephone Encounter (Signed)
Patient is returning call.  °

## 2018-11-24 NOTE — Telephone Encounter (Signed)
Spoke with pt. Pt no longer wants to take her Indapamide, She feels that it is making her kidney function worse.  Per [name], NP that pt had no evidence of edema and would be fine to stop medication. Pt was was advised to work on weight loss and to limit salt intake. We also advised pt to monitor her blood pressure and if its consistently running above 140/90 we would refer to to the hypertension clinic. Pt stated that she did not have a blood pressure machine. I let her know that our office would provide with her with one. Pt was agreeable with plan and thanked me for the call.

## 2018-11-24 NOTE — Telephone Encounter (Signed)
Pt made aware of results. Pt verbalized understanding. Pt will come into our office today for labs

## 2018-11-24 NOTE — Telephone Encounter (Signed)
New Message  Patient is calling back about results. Please give patient a call back.

## 2018-11-24 NOTE — Telephone Encounter (Signed)
Attempted to return pt call but there was no answer. LVM 

## 2018-11-24 NOTE — Telephone Encounter (Signed)
-----   Message from Daune Perch, NP sent at 11/24/2018  7:20 AM EDT ----- There continues to be some impairment of kidney function. Need to make sure that blood pressure is always well controlled. Avoid non-steroidal antiinflamatory medications like ibuprofen, Alieve, Goody powders. Need to lose weight.  -Glucose has been elevated on labs. I do not see a recent A1c. There may be diabetes that needs to be treated. Tammy [name] please have pt come in for A1c lab. Follow up with primary care.   Daune Perch, NP

## 2018-11-24 NOTE — Therapy (Signed)
[facility] [address] [place], [place], [address] Phone: [phone]   Fax:  [phone]  Physical Therapy Evaluation  Patient Details  Name: Tammy FusCheryl A Nesby MRN: [id number] Date of Birth: [birth year] Referring Provider (PT): Westley ChandlerBrown, Carina M, MD    Encounter Date: 11/24/2018  PT End of Session - 11/24/18 1135    Visit Number  1    Number of Visits  1    Date for PT Re-Evaluation  11/25/18    PT Start Time  1100    PT Stop Time  1135    PT Time Calculation (min)  35 min    Activity Tolerance  Patient tolerated treatment well    Behavior During Therapy  [facility] for tasks assessed/performed;Anxious       Past Medical History:  Diagnosis Date  . Abnormal mammogram 10.25.11   area of density with several adjacent amorphous calcifications located laterally w/in the right breast at approx 9-10 o'clock position likely represents area of evolving far necrosis; f/u mamo and U/S in 6 months  . CAD (coronary artery disease)    a. 07/2014 NSTEMI/Cath: LM nl, LAD 30ost/m, 60d, LCX 20p, OM1 20, OM2 40, OM3 50, RCA 30p, 9782m, 20d, RPDA 40, EF 65%-->Med Rx;  b. 07/2014 Echo: EF 55-60%, no rwma, mildly dil LA.  . Essential hypertension   . GERD (gastroesophageal reflux disease)   . Hyperlipidemia   . Morbid obesity (HCC)   . PAD (peripheral artery disease) (HCC)    a. 07/2014 ABI's: R 0.88, L 0.32.  Marland Kitchen. Ulcer     Past Surgical History:  Procedure Laterality Date  . CARDIAC CATHETERIZATION N/A 07/16/2014   Procedure: Left Heart Cath and Coronary Angiography;  Surgeon: Kathleene Hazelhristopher D McAlhany, MD;  [facility];  Service: Cardiovascular;  Laterality: N/A;  . CESAREAN SECTION    . PERIPHERAL VASCULAR CATHETERIZATION N/A 02/12/2015   Procedure: Abdominal Aortogram;  Surgeon: Nada LibmanVance W Brabham, MD;  Location: [facility] INVASIVE CV LAB;  Service: Cardiovascular;  Laterality: N/A;  . TRIGGER FINGER RELEASE Left    middle finger  . UPPER  GASTROINTESTINAL ENDOSCOPY  1991    There were no vitals filed for this visit.   Subjective Assessment - 11/24/18 1109    Subjective  pt is a 63 y.o with CC of upper back pain that started off an on for voer 30 years. she reports having spasm which feels like a grabbing that has no specific onset. She reports when this occurs it causes her difficulty breathing deeply.    Limitations  Lifting;Walking;Standing    How long can you sit comfortably?  unlimited    How long can you stand comfortably?  5-10 min    How long can you walk comfortably?  5-10 min    Patient Stated Goals  to decrease pain,    Currently in Pain?  Yes    Pain Score  0-No pain   at worst 10/10   Pain Location  Back    Pain Orientation  Right;Left    Pain Descriptors / Indicators  Aching;Sore    Pain Type  Chronic pain    Pain Onset  More than a month ago    Pain Frequency  Intermittent    Aggravating Factors   unsure    Pain Relieving Factors  resting         [facility] PT Assessment - 11/24/18 0001      Assessment   Medical Diagnosis  thoracic pain  Referring Provider (PT)  Westley Chandler, MD     Onset Date/Surgical Date  --   30 years   Hand Dominance  Right    Next MD Visit  unsure    Prior Therapy  no      Precautions   Precautions  None      Restrictions   Weight Bearing Restrictions  No      Balance Screen   Has the patient fallen in the past 6 months  No    Has the patient had a decrease in activity level because of a fear of falling?   No    Is the patient reluctant to leave their home because of a fear of falling?   No      Home Environment   Living Environment  Private residence    Living Arrangements  Alone    Type of Home  Apartment    Home Access  Level entry    Home Layout  One level    Home Equipment  Morgan - single point      Prior Function   Level of Independence  Independent    Vocation  On disability      Cognition   Overall Cognitive Status  Within Functional Limits for  tasks assessed      Posture/Postural Control   Posture/Postural Control  Postural limitations    Postural Limitations  Rounded Shoulders;Forward head    Posture Comments  pt presents with excessive slumped seated position       ROM / Strength   AROM / PROM / Strength  AROM;Strength      AROM   Overall AROM   Within functional limits for tasks performed    AROM Assessment Site  Thoracic;Lumbar      Palpation   Palpation comment  TTP along bil thoracolumbar paraspinals      Ambulation/Gait   Ambulation/Gait  Yes    Gait Pattern  Decreased stride length;Step-through pattern;Antalgic                Objective measurements completed on examination: See above findings.              PT Education - 11/24/18 1134    Education Details  evaluation finding, HEP with proper form/ rationale. benefits of gentle exercise even when spasm is occuring to promote relief.    Person(s) Educated  Patient    Methods  Explanation;Verbal cues    Comprehension  Verbal cues required;Verbalized understanding                  Plan - 11/24/18 1135    Clinical Impression Statement  pt presents to OPPT with CC of chronic mid to low back pain going on for over 30 years. Patient presents with apprehension regarding potential pain provocation and was reluctant to go through the evaluation due to increaesd fear of pain hence limited evalution. she demonstrates functional ROM for thoracic and lumbar mobility. TTP along bil lumbar parapsinals, unable to produced concordant symptoms today. provided HEP handout to promote mobility and general core/ hip strength. pt opted to only go through the evaluation today and not schedule future appointments.    Personal Factors and Comorbidities  Age    Clinical Decision Making  Moderate    PT Frequency  One time visit    PT Next Visit Plan  pt requested only 1 visit    PT Home Exercise Plan  see pt instruction section    Consulted  and Agree with  Plan of Care  Patient       Patient will benefit from skilled therapeutic intervention in order to improve the following deficits and impairments:  Pain, Postural dysfunction, Improper body mechanics, Increased muscle spasms, Decreased activity tolerance, Decreased endurance  Visit Diagnosis: Chronic bilateral low back pain without sciatica  Abnormal posture     Problem List Patient Active Problem List   Diagnosis Date Noted  . Bilateral thoracic back pain 11/05/2018  . Muscle spasm 08/08/2017  . Lateral epicondylitis 12/03/2016  . CAD (coronary artery disease)   . PAD (peripheral artery disease) ([place]) 07/20/2014  . HLD (hyperlipidemia)   . NSTEMI (non-ST elevated myocardial infarction) ([place]) 07/15/2014  . Essential hypertension   . Morbid obesity ([place])   . Angina pectoris ([place])   . Tobacco abuse   . TOBACCO DEPENDENCE 04/29/2006  . HYPERTENSION, BENIGN SYSTEMIC 04/29/2006    Starr Lake PT, DPT, LAT, ATC  11/24/18  11:43 AM      [facility] [address] [place], [place], [address] Phone: [phone]   Fax:  [phone]  Name: ALLEAH DEARMAN MRN: [id number] Date of Birth: 12-Oct-1955

## 2018-11-25 ENCOUNTER — Telehealth: Payer: Self-pay | Admitting: Cardiology

## 2018-11-25 NOTE — Telephone Encounter (Signed)
New message   Patient is returning call about A1c results. Please call.

## 2018-12-05 ENCOUNTER — Other Ambulatory Visit: Payer: Self-pay

## 2018-12-05 ENCOUNTER — Ambulatory Visit (INDEPENDENT_AMBULATORY_CARE_PROVIDER_SITE_OTHER): Payer: Medicaid Other | Admitting: *Deleted

## 2018-12-05 DIAGNOSIS — Z23 Encounter for immunization: Secondary | ICD-10-CM | POA: Diagnosis not present

## 2018-12-17 ENCOUNTER — Other Ambulatory Visit: Payer: Self-pay | Admitting: Student in an Organized Health Care Education/Training Program

## 2018-12-17 DIAGNOSIS — I739 Peripheral vascular disease, unspecified: Secondary | ICD-10-CM

## 2018-12-20 ENCOUNTER — Other Ambulatory Visit: Payer: Self-pay

## 2018-12-20 DIAGNOSIS — I739 Peripheral vascular disease, unspecified: Secondary | ICD-10-CM

## 2018-12-21 MED ORDER — INDAPAMIDE 2.5 MG PO TABS: 2.5000 mg | ORAL_TABLET | ORAL | 0 refills | Status: DC

## 2018-12-21 MED ORDER — ATORVASTATIN CALCIUM 80 MG PO TABS: ORAL_TABLET | ORAL | 3 refills | Status: DC

## 2019-01-13 ENCOUNTER — Telehealth: Payer: Self-pay | Admitting: Student in an Organized Health Care Education/Training Program

## 2019-01-13 NOTE — Telephone Encounter (Signed)
Patient is having a female issue and does not want to discuss or say anymore but would like a nurse to call her to discuss today please.  Best contact number is [phone].

## 2019-01-16 NOTE — Telephone Encounter (Signed)
Called patient to inquire as to what female issue patient was having.  Patient states that she thinks that the  new Statin for cholesterol that she is taking is producing boils on her vagina.   Patient has soaked in bathtub and it seems to alleviate the pain.  They are on her upper part and are quarter size. They are very painful and bleed.     Patient would like for Dr. Ouida Sills to call her back.  Tammy [name], [place]

## 2019-01-18 NOTE — Telephone Encounter (Signed)
Please have patient schedule an appointment asap to examine them

## 2019-01-19 NOTE — Telephone Encounter (Signed)
Patient was called and inquiry was made concerning boils on vagina.  Patient states that boils are almost gone and there is no pain.  Patient does not feel that she should make an appointment at the present because there is nothing to see.  Advised patient that if boils return that she should defiantly come in.  Patient agrees.  Ozella Almond, CMA'

## 2019-01-24 ENCOUNTER — Other Ambulatory Visit: Payer: Self-pay | Admitting: Student in an Organized Health Care Education/Training Program

## 2019-01-24 DIAGNOSIS — N6489 Other specified disorders of breast: Secondary | ICD-10-CM

## 2019-02-13 ENCOUNTER — Other Ambulatory Visit: Payer: Self-pay | Admitting: *Deleted

## 2019-02-13 DIAGNOSIS — E782 Mixed hyperlipidemia: Secondary | ICD-10-CM

## 2019-02-13 DIAGNOSIS — Z79899 Other long term (current) drug therapy: Secondary | ICD-10-CM

## 2019-02-14 ENCOUNTER — Telehealth: Payer: Self-pay | Admitting: *Deleted

## 2019-02-14 ENCOUNTER — Other Ambulatory Visit: Payer: Medicaid Other

## 2019-02-14 ENCOUNTER — Encounter (INDEPENDENT_AMBULATORY_CARE_PROVIDER_SITE_OTHER): Payer: Self-pay

## 2019-02-14 ENCOUNTER — Other Ambulatory Visit: Payer: Self-pay

## 2019-02-14 DIAGNOSIS — E782 Mixed hyperlipidemia: Secondary | ICD-10-CM

## 2019-02-14 DIAGNOSIS — Z79899 Other long term (current) drug therapy: Secondary | ICD-10-CM | POA: Diagnosis not present

## 2019-02-14 LAB — LIPID PANEL
Chol/HDL Ratio: 2.8 ratio (ref 0.0–4.4)
Cholesterol, Total: 143 mg/dL (ref 100–199)
HDL: 51 mg/dL (ref >39)
LDL Chol Calc (NIH): 80 mg/dL (ref 0–99)
Triglycerides: 59 mg/dL (ref 0–149)
VLDL Cholesterol Cal: 12 mg/dL (ref 5–40)

## 2019-02-14 LAB — HEPATIC FUNCTION PANEL
ALT: 10 IU/L (ref 0–32)
AST: 21 IU/L (ref 0–40)
Albumin: 3.7 g/dL — ABNORMAL LOW (ref 3.8–4.8)
Alkaline Phosphatase: 99 IU/L (ref 39–117)
Bilirubin Total: 0.5 mg/dL (ref 0.0–1.2)
Bilirubin, Direct: 0.16 mg/dL (ref 0.00–0.40)
Total Protein: 6.6 g/dL (ref 6.0–8.5)

## 2019-02-14 MED ORDER — EZETIMIBE 10 MG PO TABS: 10.0000 mg | ORAL_TABLET | Freq: Every day | ORAL | 3 refills | Status: DC

## 2019-02-14 NOTE — Telephone Encounter (Signed)
Pt has been notified of lab results by phone with verbal understanding. Pt states she needs a refill on her Zetia as she has only 5 pills left. I have sent in a refill for Zetia 10 mg; take 1 tablet daily #90 x 3. Pt thanked me for the call. Patient notified of result.  Please refer to phone note from today for complete details.   Julaine Hua, CMA 02/14/2019 5:25 PM

## 2019-02-14 NOTE — Telephone Encounter (Signed)
-----   Message from Thayer Headings, MD sent at 02/14/2019  5:06 PM EST ----- Lipids look better

## 2019-02-17 ENCOUNTER — Telehealth: Payer: Self-pay | Admitting: Cardiovascular Disease

## 2019-02-17 NOTE — Telephone Encounter (Signed)
New Message     Pt is calling back about the lab results she had gotten on 12/15  She has questions about her Kidney Function     Please call

## 2019-02-17 NOTE — Telephone Encounter (Signed)
Spoke with patient who asks if we checked her kidney function when she had lab work last week. I advised that we did not recheck her kidney function at that time. She reports an intermittent bump comes up in her groin area that she thinks started when she started the cholesterol medicine. She states she is not sexually active so she knows that it is not sexually transmitted. She has an appointment with her PCP and I advised that if her PCP thinks it is related to any of her cardiac medications to call back and let us know. She verbalized understanding and agreement and thanked me for the call.

## 2019-03-06 ENCOUNTER — Other Ambulatory Visit: Payer: Self-pay

## 2019-03-06 ENCOUNTER — Ambulatory Visit: Payer: Medicaid Other

## 2019-03-06 ENCOUNTER — Ambulatory Visit
Admission: RE | Admit: 2019-03-06 | Discharge: 2019-03-06 | Disposition: A | Discharge status: Home / Self Care | Payer: Medicaid Other | Source: Ambulatory Visit | Attending: *Deleted | Admitting: *Deleted

## 2019-03-06 DIAGNOSIS — R928 Other abnormal and inconclusive findings on diagnostic imaging of breast: Secondary | ICD-10-CM | POA: Diagnosis not present

## 2019-03-06 DIAGNOSIS — N6489 Other specified disorders of breast: Secondary | ICD-10-CM

## 2019-03-07 ENCOUNTER — Other Ambulatory Visit: Payer: Self-pay | Admitting: Student in an Organized Health Care Education/Training Program

## 2019-03-08 NOTE — Telephone Encounter (Signed)
Called patient to schedule an appointment in order to get a refill of Flexeril.  No answer and LVM to call office to make appointment.  Glennie Hawk, CMA

## 2019-03-08 NOTE — Telephone Encounter (Signed)
Please call patient to have her schedule an appointment to discuss this medication before any refills. Thank you.

## 2019-03-15 ENCOUNTER — Ambulatory Visit (INDEPENDENT_AMBULATORY_CARE_PROVIDER_SITE_OTHER): Payer: Medicaid Other | Admitting: Student in an Organized Health Care Education/Training Program

## 2019-03-15 ENCOUNTER — Other Ambulatory Visit: Payer: Self-pay

## 2019-03-15 ENCOUNTER — Encounter: Payer: Self-pay | Admitting: Student in an Organized Health Care Education/Training Program

## 2019-03-15 DIAGNOSIS — I10 Essential (primary) hypertension: Secondary | ICD-10-CM

## 2019-03-15 DIAGNOSIS — M62838 Other muscle spasm: Secondary | ICD-10-CM

## 2019-03-15 DIAGNOSIS — E785 Hyperlipidemia, unspecified: Secondary | ICD-10-CM | POA: Diagnosis not present

## 2019-03-15 MED ORDER — CYCLOBENZAPRINE HCL 5 MG PO TABS: ORAL_TABLET | ORAL | 1 refills | Status: DC

## 2019-03-15 NOTE — Patient Instructions (Signed)
It was a pleasure to see you today!  To summarize our discussion for this visit:  I will refill your back muscle relaxant. I do highly recommend that you try to do more or the back muscle strengthening exercises from physical therapy.  I'm glad to see your cholesterol looks better. We will recheck those numbers in a couple months and make a decision on what to do there.   At that visit, we can discuss these other health maintenance issues that are due.  Some additional health maintenance measures we should update are: Health Maintenance Due  Topic Date Due  . Hepatitis C Screening  12/05/1955  . COLONOSCOPY  07/11/2006  . TETANUS/TDAP  06/14/2016  .    Please return to our clinic to see me in 2 months.  Call the clinic at [phone] if your symptoms worsen or you have any concerns.   Thank you for allowing me to take part in your care,  Dr. Jamelle Rushing

## 2019-03-15 NOTE — Assessment & Plan Note (Addendum)
Continue current medications. Past visits have been intermittently well and poorly controlled.  I believe she would be a good candidate for the 24-hour blood pressure monitor. Reassess at next visit and if continues to be elevated consider changing therapy or referring to pharmacy. Encourage patient to check blood pressure at home.

## 2019-03-15 NOTE — Progress Notes (Signed)
   Subjective:    Patient ID: Tammy [name], female    DOB: [birth year], 64 y.o.   MRN: [id number]  CC: medication refill  HPI:  Refill of muscle relaxant for back spasms-patient has lumbar spine muscle spasms that have been present for approximately 30 years.  Patient was seen by PT and received several exercises which patient states helped when she did them but refuses to do them anymore.  She states that the only thing that works for her are her muscle relaxants and her grandchildren bring her warm towels.  She would not like to try any other treatments at this time.  Denies paresthesias, loss of bowel or bladder function, fevers.  Cholesterol-patient was taking ezetimibe for 3 months which was associated with a irritating vaginal rash.  When she stopped taking the medication the rash cleared up.  Her lipid profile 1 month ago was within normal limits.  Discussed options with patient for further therapy.  She declines to use Zetia ever again.  Continues to take atorvastatin 80 mg daily.  Has follow-up appointment with cardiology in the near future.  Elevated blood pressure two 160/86 on presentation, repeat improved to 139/90.  Patient was negative for symptoms.  She is adherent with her blood pressure medications.  Takes her blood pressure at home occasionally but states that her blood pressure reader intermittently does not work.  Smoking status reviewed   ROS: pertinent noted in the HPI   I have personally reviewed pertinent past medical history, surgical, family, and social history as appropriate. Objective:  BP (!) 168/86   Pulse 63   Wt 271 lb 12.8 oz (123.3 kg)   SpO2 97%   BMI 43.87 kg/m  Rechecked blood pressure during office visit which improved to 139/90 Vitals and nursing note reviewed  General: NAD, pleasant, able to participate in exam Cardiac: RRR, S1 S2 present. normal heart sounds, no murmurs. Respiratory: CTAB, normal effort, No wheezes, rales or rhonchi  Extremities: no edema or cyanosis. Skin: warm and dry, no rashes noted Neuro: alert, no obvious focal deficits Psych: Normal affect and mood  Assessment & Plan:   Essential hypertension Continue current medications. Past visits have been intermittently well and poorly controlled.  I believe she would be a good candidate for the 24-hour blood pressure monitor. Reassess at next visit and if continues to be elevated consider changing therapy or referring to pharmacy. Encourage patient to check blood pressure at home.  Muscle spasm Lumbar back pain chronic and unchanged. Recommended continuing back muscle strengthening exercises which patient declined. Refill Flexeril.  Hyperlipidemia Last lipid panel 1 month ago was within normal limits after using Zetia x3 months. Discussed therapy options with patient and we decided to recheck a lipid panel in 2 months and make further decision at that time.   Meds ordered this encounter  Medications  . cyclobenzaprine (FLEXERIL) 5 MG tablet    Sig: TAKE 1 TABLET BY MOUTH EVERY 8 HOURS AS NEEDED FOR MUSCLE SPASMS    Dispense:  15 tablet    Refill:  1    Jamelle Rushing, DO [facility] PGY-2

## 2019-03-15 NOTE — Assessment & Plan Note (Signed)
Lumbar back pain chronic and unchanged. Recommended continuing back muscle strengthening exercises which patient declined. Refill Flexeril.

## 2019-03-15 NOTE — Assessment & Plan Note (Signed)
Last lipid panel 1 month ago was within normal limits after using Zetia x3 months. Discussed therapy options with patient and we decided to recheck a lipid panel in 2 months and make further decision at that time.

## 2019-03-29 ENCOUNTER — Telehealth: Payer: Self-pay | Admitting: Cardiovascular Disease

## 2019-03-29 ENCOUNTER — Telehealth: Payer: Self-pay

## 2019-03-29 NOTE — Telephone Encounter (Signed)
Called patient for nurse visit to check blood pressure.  Patient states that she has a blood pressure  cuff at home, but, is not sure if she is using it correctly because it seems to be losing air.  Suggested to patient that she bring it in at next visit and perhaps be shown how to use it since it is basically new.  She has had it for a month.  Patient was a bit agitated because she wants the Covid-19 shot but," has to run around making an appointment at the [facility] online."  Patient does not understand why she can not get vaccine at [facility].  Explained to patient that our office currently does not have the vaccine and apologized for this inconvenience.  Patient states that she will call office back at later date because she needs to get her shot first and she has other appointments going on.  She will be available after 04/07/2019.  Glennie Hawk, CMA

## 2019-03-29 NOTE — Telephone Encounter (Signed)
We are recommending the COVID-19 vaccine to all of our patients. Cardiac medications (including blood thinners) should not deter anyone from being vaccinated and there is no need to hold any of those medications prior to vaccine administration.     Currently, there is a hotline to call (active 03/10/19) to schedule vaccination appointments as no walk-ins will be accepted.   Number: [phone].    If an appointment is not available please go to [email or web address] to sign up for notification when additional vaccine appointments are available.   If you have further questions or concerns about the vaccine process, please visit [email or web address] or contact your primary care physician.   

## 2019-06-26 ENCOUNTER — Encounter: Payer: Self-pay | Admitting: Family Medicine

## 2019-06-26 ENCOUNTER — Other Ambulatory Visit: Payer: Self-pay

## 2019-06-26 ENCOUNTER — Other Ambulatory Visit (HOSPITAL_COMMUNITY)
Admission: RE | Admit: 2019-06-26 | Discharge: 2019-06-26 | Disposition: A | Discharge status: Home / Self Care | Payer: Medicaid Other | Source: Ambulatory Visit | Attending: Family Medicine | Admitting: Family Medicine

## 2019-06-26 ENCOUNTER — Ambulatory Visit (INDEPENDENT_AMBULATORY_CARE_PROVIDER_SITE_OTHER): Payer: Medicaid Other | Admitting: Family Medicine

## 2019-06-26 VITALS — BP 180/100 | HR 60 | Ht 66.0 in | Wt 276.0 lb

## 2019-06-26 DIAGNOSIS — I10 Essential (primary) hypertension: Secondary | ICD-10-CM

## 2019-06-26 DIAGNOSIS — Z113 Encounter for screening for infections with a predominantly sexual mode of transmission: Secondary | ICD-10-CM | POA: Diagnosis not present

## 2019-06-26 LAB — POCT WET PREP (WET MOUNT)
Clue Cells Wet Prep Whiff POC: NEGATIVE
Trichomonas Wet Prep HPF POC: ABSENT

## 2019-06-26 NOTE — Assessment & Plan Note (Signed)
Discussed at length. She reports compliance, no side effects. BMP today. Follow up in 1 week to review medications.  Asked to bring all medications with her as she reports she is taking just 2 BP pills in total per day (should have 2 metoprolol doses, 1 CCB, 1 indapamide) Repeat BP was still elevated. No signs of hypertensive emergency. Labs pending. Will likely need to stop thiazide in future given progression of CKD. Would consider referral to Nephrology in future for BP management given intolerance of multiple agents and difficult to control HTN.

## 2019-06-26 NOTE — Patient Instructions (Signed)
I will call you with results  It was wonderful to see you today.  Please bring ALL of your medications with you to every visit.   Thank you for choosing [facility].   Please call [phone] with any questions about today's appointment.  Please be sure to schedule follow up at the front  desk before you leave today.   [name], MD  Family Medicine    Please BRING all of your medications with you (bottles in a bag) to your next visit

## 2019-06-26 NOTE — Progress Notes (Signed)
    SUBJECTIVE:   CHIEF COMPLAINT / HPI:   Tammy [name] is a pleasant 64 year old woman with history significant for chronic kidney disease, obesity and difficult to control hypertension presenting today for sexually-transmitted infection check.  She reports overall she is doing okay.  She has some new stressors and reports her blood pressure elevation is likely secondary to this.  She has had 1 long-term female partner.  She reports concern for sexually transmitted infections today.  She reports this is her stressors.  She denies low mood or thoughts of hurting herself or others.  She reports some anxiety related to the testing.  She denies discharge or other symptoms including fevers or rash.  The patient reports compliance with her blood pressure pills. She reports she takes her cholesterol pill and a single blood pressure pill at night. She takes her clopidogrel and another blood pressure pill in the morning.  Review of her medication list shows she is actually on metoprolol twice daily as well as calcium channel blocker and thiazide like diuretic.  She denies side effects from these medications.  She specifically denies headache, vision changes, chest pain or dyspnea today.  She reports compliance with her medication.  She reports she did not take her morning pills today.   PERTINENT  PMH / PSH/Family/Social History :  Hypertension, obesity, CKD   OBJECTIVE:   BP (!) 180/100   Pulse 60   Ht 5\' 6"  (1.676 m)   Wt 276 lb (125.2 kg)   SpO2 98%   BMI 44.55 kg/m   HEENT: Sclera anicteric. Dentition is moderate. Appears well hydrated. Neck: Supple Cardiac: Regular rate and rhythm. Normal S1/S2. No murmurs, rubs, or gallops appreciated. Lungs: Clear bilaterally to ascultation.  Psych: Pleasant and appropriate  GU Exam:  Chaperoned exam.  External exam: Normal-appearing female external genitalia.  Vaginal exam notable for no discharge.  Cervix without discharge or obvious lesion.    ASSESSMENT/PLAN:   HYPERTENSION, BENIGN SYSTEMIC Discussed at length. She reports compliance, no side effects. BMP today. Follow up in 1 week to review medications.  Asked to bring all medications with her as she reports she is taking just 2 BP pills in total per day (should have 2 metoprolol doses, 1 CCB, 1 indapamide) Repeat BP was still elevated. No signs of hypertensive emergency. Labs pending. Will likely need to stop thiazide in future given progression of CKD. Would consider referral to Nephrology in future for BP management given intolerance of multiple agents and difficult to control HTN.   STI Concern, HIV, Hep C, RPR, CG/CT. She is up to date on Pap. Will call with results.    HCM Follow up with PCP scheduled in 1 week.   , MD  Family Medicine Teaching Service  [facility]

## 2019-06-27 ENCOUNTER — Telehealth: Payer: Self-pay | Admitting: Family Medicine

## 2019-06-27 LAB — BASIC METABOLIC PANEL
BUN/Creatinine Ratio: 17 (ref 12–28)
BUN: 22 mg/dL (ref 8–27)
CO2: 17 mmol/L — ABNORMAL LOW (ref 20–29)
Calcium: 9.2 mg/dL (ref 8.7–10.3)
Chloride: 109 mmol/L — ABNORMAL HIGH (ref 96–106)
Creatinine, Ser: 1.29 mg/dL — ABNORMAL HIGH (ref 0.57–1.00)
GFR calc Af Amer: 51 mL/min/{1.73_m2} — ABNORMAL LOW (ref >59)
GFR calc non Af Amer: 44 mL/min/{1.73_m2} — ABNORMAL LOW (ref >59)
Glucose: 104 mg/dL — ABNORMAL HIGH (ref 65–99)
Potassium: 4.4 mmol/L (ref 3.5–5.2)
Sodium: 142 mmol/L (ref 134–144)

## 2019-06-27 LAB — CERVICOVAGINAL ANCILLARY ONLY
Chlamydia: NEGATIVE
Comment: NEGATIVE
Comment: NORMAL
Neisseria Gonorrhea: NEGATIVE

## 2019-06-27 LAB — HIV ANTIBODY (ROUTINE TESTING W REFLEX): HIV Screen 4th Generation wRfx: NONREACTIVE

## 2019-06-27 LAB — HCV COMMENT:

## 2019-06-27 LAB — RPR: RPR Ser Ql: NONREACTIVE

## 2019-06-27 LAB — HEPATITIS C ANTIBODY (REFLEX): HCV Ab: 0.1 s/co ratio (ref 0.0–0.9)

## 2019-06-27 NOTE — Telephone Encounter (Signed)
Called the patient.  Discussed negative results, awaiting GC/CT.  Creatinine remains slightly elevated, recommend Nephrology consultation in future given difficulty with anti-hypertensive side effects and CKD. Additionally, has mild non-gapped acidosis.   PCP follow up next week.  All questions answered.   [name], MD  Family Medicine Teaching Service

## 2019-06-30 NOTE — Telephone Encounter (Addendum)
Discussed negative results again. Gonorrhea and chlamydia negative (see note 4/27 at 2:18 PM with result).   During conversation, patient reports after pelvic examination, provider and CMA left room. She sat up off table but patient tripped on sheet (hanging on side of table), rolled ankle and toes. Reports she feel on leg of table. I returned to room, patient did not inform me of this. She walked out to car and noted her ankle was swollen. Denies leg edema, history of VTE. Endorses some pain with ambulation, improved with RICE.  - Recommend ankle examination, consideration of imaging on Monday.  Will route note to CMA Fleeger given fall in clinic. All questions answered.  [name], MD  Family Medicine Teaching Service

## 2019-06-30 NOTE — Telephone Encounter (Signed)
Patient said she was expecting a call yesterday for her results from her appointment with Dr. Manson Passey.  Please call patient back to discuss these results.

## 2019-07-03 ENCOUNTER — Other Ambulatory Visit: Payer: Self-pay

## 2019-07-03 ENCOUNTER — Encounter: Payer: Self-pay | Admitting: Student in an Organized Health Care Education/Training Program

## 2019-07-03 ENCOUNTER — Ambulatory Visit (HOSPITAL_COMMUNITY)
Admission: RE | Admit: 2019-07-03 | Discharge: 2019-07-03 | Disposition: A | Discharge status: Home / Self Care | Payer: Medicaid Other | Source: Ambulatory Visit | Attending: Family Medicine | Admitting: Family Medicine

## 2019-07-03 ENCOUNTER — Telehealth: Payer: Self-pay | Admitting: Student in an Organized Health Care Education/Training Program

## 2019-07-03 ENCOUNTER — Ambulatory Visit (INDEPENDENT_AMBULATORY_CARE_PROVIDER_SITE_OTHER): Payer: Medicaid Other | Admitting: Student in an Organized Health Care Education/Training Program

## 2019-07-03 VITALS — BP 180/104 | HR 55 | Ht 66.0 in | Wt 274.0 lb

## 2019-07-03 DIAGNOSIS — Z789 Other specified health status: Secondary | ICD-10-CM | POA: Diagnosis not present

## 2019-07-03 DIAGNOSIS — I10 Essential (primary) hypertension: Secondary | ICD-10-CM | POA: Diagnosis not present

## 2019-07-03 DIAGNOSIS — M7989 Other specified soft tissue disorders: Secondary | ICD-10-CM | POA: Diagnosis not present

## 2019-07-03 DIAGNOSIS — M542 Cervicalgia: Secondary | ICD-10-CM | POA: Diagnosis not present

## 2019-07-03 DIAGNOSIS — M79671 Pain in right foot: Secondary | ICD-10-CM

## 2019-07-03 DIAGNOSIS — S99921A Unspecified injury of right foot, initial encounter: Secondary | ICD-10-CM | POA: Diagnosis not present

## 2019-07-03 MED ORDER — LOSARTAN POTASSIUM 25 MG PO TABS
25.0000 mg | ORAL_TABLET | Freq: Every day | ORAL | 0 refills | Status: DC
Start: 2019-07-03 — End: 2019-07-24

## 2019-07-03 NOTE — Patient Instructions (Signed)
It was a pleasure to see you today!  To summarize our discussion for this visit:  We are getting an xray of your foot to look for a bone fracture  I have put in a referral for podiatry as requested  You can request a change of PCP form at the front desk  I am prescribing a new medication for your blood pressure. Please come back in 2 weeks to be seen by me or someone else to monitor how well it is working.  Some additional health maintenance measures we should update are: Health Maintenance Due  Topic Date Due  . COVID-19 Vaccine (1) Never done  . COLONOSCOPY  Never done  . TETANUS/TDAP  06/14/2016  .   Please return to our clinic to see [place] in 2 weeks..  Call the clinic at [phone] if your symptoms worsen or you have any concerns.   Thank you for allowing me to take part in your care,  Dr. Jamelle Rushing

## 2019-07-03 NOTE — Telephone Encounter (Signed)
Patient is calling to go over x-ray results to see if anything was found, because her foot is still hurting. Thanks

## 2019-07-03 NOTE — Progress Notes (Signed)
SUBJECTIVE:   CHIEF COMPLAINT / HPI: swollen foot, neck pain  Swollen foot- patient was seen in clinic recently and received pelvic exam which was unremarkable. While getting off exam table to get dressed, she took a mistep to the floor and noticed pain in her Right foot which resolved spontaneously. She does not recall what specifically her foot did at that time. She did not fall to the floor or tell anyone of the injury as she felt better when she walked out. When she got to her car, she noticed that her foot was swollen. The foot has been swollen since that time but fluctuates in size. Elevating the foot improves the swelling and pain. Her OTC medications have not improved the pain. Still able to ambulate. She would like a referral to podiatry for the calluses on her feet (L worse than right).  Neck pain- has had 1-2 weeks of right sided neck pain that is characterized by acute onset of tightening of the muscle and is improved with tylenol and a warm pad. There is no pain at rest and no pain today. She has felt this sensation before which was resolved with muscle relaxant. Has no decreased ROM, rash, warmth.   HTN- poorly controlled. Patient states that she is adherent with her medications. Also states that she is taking a "blood thinner" for her blood pressure but I do not see one on her list. Asked her to clarify and patient began shouting about her blood thinner but couldn't recall the name. She may be talking about clopidogrel? Does not check BP at home. BP elevated in office today to 180/104, recheck is 184/104. Patient has been initiated on other medications in the past but stopped them because she believed they gave her boils in her vagina. Denies chest pain, SOB, LE swelling other than noted above at this time.  Change of PCP- per note review, patient would like new PCP. She confirms this today but offers no explanation.   PERTINENT  PMH / PSH: poorly controlled HTN, h/o  NSTEMI  OBJECTIVE:   BP (!) 180/104   Pulse (!) 55   Ht 5\' 6"  (1.676 m)   Wt 274 lb (124.3 kg)   SpO2 100%   BMI 44.22 kg/m   General: NAD, able to participate in exam HEENT: Negative for tenderness or tonicity of neck musculature. Full, painless ROM.  Cardiac: RRR, normal heart sounds, no murmurs. 2+ radial and PT pulses bilaterally Respiratory: CTAB, normal effort, No wheezes, rales or rhonchi Abdomen: obese, soft, nontender, nondistended, no hepatic or splenomegaly, +BS Extremities:  Right foot edema to ankle- non-pitting, non-tense. Point tenderness to 4th distal metatarsal. No bruising or lesions. Intact sensation, ROM, and pulse. Good capillary refill. Left foot normal Skin: warm and dry, no rashes noted Neuro: alert and oriented x4, no focal deficits Psych: Normal affect and mood  ASSESSMENT/PLAN:   Foot swelling Xray ordered and reviewed. No signs of fracture or soft tissue abnormalities.  - recommend continue OTC pain medication as needed and elevation plus ice, rest - can consider lymphatic drainage technique - if not improving by end of week, could consider DVT evaluation or CT - referral to podiatry as requested for calluses  Neck pain Normal exam. Likely muscle spasm. Suspect torticollis. Recommended rest, stretching, warm compresses and continue OTC pain relief.   Essential hypertension Poorly controlled. asymptomatic. Cr elevated 1.29, GFR reduced. - declined ACEi in the past for vaginal boils - starting losartan today. Had BMP last  week. - asked patient to return in 2 weeks to check bloodwork and BP on new medication  Under care of service provider Instructed patient on procedure for PCP change.     L[place]

## 2019-07-05 ENCOUNTER — Ambulatory Visit (INDEPENDENT_AMBULATORY_CARE_PROVIDER_SITE_OTHER): Payer: Medicaid Other | Admitting: Family Medicine

## 2019-07-05 ENCOUNTER — Other Ambulatory Visit: Payer: Self-pay

## 2019-07-05 ENCOUNTER — Encounter: Payer: Self-pay | Admitting: Family Medicine

## 2019-07-05 DIAGNOSIS — M79671 Pain in right foot: Secondary | ICD-10-CM

## 2019-07-05 NOTE — Progress Notes (Signed)
    SUBJECTIVE:   CHIEF COMPLAINT / HPI:   Right ankle/foot pain Patient presents to clinic today after experiencing a fall in our clinic. It occurred after her appointment was over and she tripped over a piece of the exam table covering. She states she was not hurt and did not hit her head. She didn't tell anyone in clinic because she was embarrassed about the fall. She states her ankle was a little swollen and sore when she got home. She is coming back because she eventually told us and we wanted to examine her today. Her x-rays were negative and her pain is much improved. She is still tender over the 4th toe area and it is a little swollen still. However, she is having no difficulty walking and states overall it is much better.    PERTINENT  PMH / PSH: CAD, HTN, Hx of NSTEMI, PAD  OBJECTIVE:   BP (!) 162/88   Pulse (!) 58   Ht 5\' 6"  (1.676 m)   Wt 276 lb 3.2 oz (125.3 kg)   SpO2 98%   BMI 44.58 kg/m   Gen: NAD MSK: Ankle/Foot, Right: TTP noted at the 4th metatarsal head. No visible erythema, mild swelling present, no ecchymosis, or bony deformity. No notable pes planus/cavus deformity. Transverse arch grossly intact; No evidence of tibiotalar deviation; Range of motion is full in all directions. Strength is 5/5 in all directions. No tenderness at the insertion/body/myotendinous junction of the Achilles tendon; No peroneal tendon tenderness or subluxation; No tenderness on posterior aspects of lateral and medial malleolus; Stable lateral and medial ligaments; Unremarkable squeeze and kleiger tests; Talar dome nontender; Unremarkable calcaneal squeeze; No plantar calcaneal tenderness; No tenderness over the navicular prominence; No tenderness over cuboid; No pain at base of 5th MT; Mild tenderness at the distal metatarsals; Negative tarsal tunnel tinel's; Able to walk 4 steps.    ASSESSMENT/PLAN:   Foot pain, right Acute, improving. Still some tenderness especially when squeezed but able  to walk normally. X-rays negative, reviewed personally. Given her tenderness to exam and continued swelling cannot completely rule out a non-displaced hairline fracture of the 4th metatarsal. - Cont Tylenol, avoid NSAIDS with history of CAD - Cont conservative measures such as ice, heat, compression, and elevation - Placed in cam walker boot for 2 weeks and then have follow up with me. She can remove it to drive, just want her wearing it while walking/standing to take pressure of metatarsal heads.     , DO [facility]

## 2019-07-05 NOTE — Telephone Encounter (Signed)
Appt made for this afternoon. Jone Baseman, CMA

## 2019-07-05 NOTE — Telephone Encounter (Signed)
Spoke with pt.  She is still having pain in her foot, appt made for this afternoon @ 3:50pm.  Discussed next steps in requesting PCP change.  Jone Baseman, CMA

## 2019-07-06 DIAGNOSIS — M542 Cervicalgia: Secondary | ICD-10-CM | POA: Insufficient documentation

## 2019-07-06 DIAGNOSIS — M7989 Other specified soft tissue disorders: Secondary | ICD-10-CM | POA: Insufficient documentation

## 2019-07-06 DIAGNOSIS — Z789 Other specified health status: Secondary | ICD-10-CM | POA: Insufficient documentation

## 2019-07-06 NOTE — Assessment & Plan Note (Signed)
Poorly controlled. asymptomatic. Cr elevated 1.29, GFR reduced. - declined ACEi in the past for vaginal boils - starting losartan today. Had BMP last week. - asked patient to return in 2 weeks to check bloodwork and BP on new medication

## 2019-07-06 NOTE — Assessment & Plan Note (Addendum)
Xray ordered and reviewed. No signs of fracture or soft tissue abnormalities.  - recommend continue OTC pain medication as needed and elevation plus ice, rest - can consider lymphatic drainage technique - if not improving by end of week, could consider DVT evaluation or CT - referral to podiatry as requested for calluses

## 2019-07-06 NOTE — Assessment & Plan Note (Signed)
Normal exam. Likely muscle spasm. Suspect torticollis. Recommended rest, stretching, warm compresses and continue OTC pain relief.

## 2019-07-06 NOTE — Assessment & Plan Note (Signed)
Instructed patient on procedure for PCP change.

## 2019-07-10 DIAGNOSIS — M79671 Pain in right foot: Secondary | ICD-10-CM | POA: Insufficient documentation

## 2019-07-10 NOTE — Patient Instructions (Signed)
Patient declines 

## 2019-07-10 NOTE — Assessment & Plan Note (Signed)
Acute, improving. Still some tenderness especially when squeezed but able to walk normally. X-rays negative, reviewed personally. Given her tenderness to exam and continued swelling cannot completely rule out a non-displaced hairline fracture of the 4th metatarsal. - Cont Tylenol, avoid NSAIDS with history of CAD - Cont conservative measures such as ice, heat, compression, and elevation - Placed in cam walker boot for 2 weeks and then have follow up with me. She can remove it to drive, just want her wearing it while walking/standing to take pressure of metatarsal heads.

## 2019-07-11 DIAGNOSIS — M79671 Pain in right foot: Secondary | ICD-10-CM | POA: Diagnosis not present

## 2019-07-20 ENCOUNTER — Other Ambulatory Visit: Payer: Self-pay

## 2019-07-20 ENCOUNTER — Ambulatory Visit (INDEPENDENT_AMBULATORY_CARE_PROVIDER_SITE_OTHER): Payer: Medicaid Other | Admitting: Family Medicine

## 2019-07-20 DIAGNOSIS — L0291 Cutaneous abscess, unspecified: Secondary | ICD-10-CM | POA: Insufficient documentation

## 2019-07-20 DIAGNOSIS — L02413 Cutaneous abscess of right upper limb: Secondary | ICD-10-CM

## 2019-07-20 MED ORDER — SULFAMETHOXAZOLE-TRIMETHOPRIM 800-160 MG PO TABS
1.0000 | ORAL_TABLET | Freq: Two times a day (BID) | ORAL | 0 refills | Status: DC
Start: 2019-07-20 — End: 2019-07-20

## 2019-07-20 MED ORDER — DOXYCYCLINE HYCLATE 100 MG PO TABS
100.0000 mg | ORAL_TABLET | Freq: Two times a day (BID) | ORAL | 0 refills | Status: AC
Start: 2019-07-20 — End: 2019-07-27

## 2019-07-20 NOTE — Patient Instructions (Addendum)
Skin Abscess  A skin abscess is an infected area of your skin that contains pus and other material. An abscess can happen in any part of your body. Some abscesses break open (rupture) on their own. Most continue to get worse unless they are treated. The infection can spread deeper into the body and into your blood, which can make you feel sick. A skin abscess is caused by germs that enter the skin through a cut or scrape. It can also be caused by blocked oil and sweat glands or infected hair follicles. This condition is usually treated by:  Draining the pus.  Taking antibiotic medicines.  Placing a warm, wet washcloth over the abscess. Follow these instructions at home: Medicines   Take over-the-counter and prescription medicines only as told by your doctor.  If you were prescribed an antibiotic medicine, take it as told by your doctor. Do not stop taking the antibiotic even if you start to feel better. Abscess care   If you have an abscess that has not drained, place a warm, clean, wet washcloth over the abscess several times a day. Do this as told by your doctor.  Follow instructions from your doctor about how to take care of your abscess. Make sure you: ? Cover the abscess with a bandage (dressing). ? Change your bandage or gauze as told by your doctor. ? Wash your hands with soap and water before you change the bandage or gauze. If you cannot use soap and water, use hand sanitizer.  Check your abscess every day for signs that the infection is getting worse. Check for: ? More redness, swelling, or pain. ? More fluid or blood. ? Warmth. ? More pus or a bad smell. General instructions  To avoid spreading the infection: ? Do not share personal care items, towels, or hot tubs with others. ? Avoid making skin-to-skin contact with other people.  Keep all follow-up visits as told by your doctor. This is important. Contact a doctor if:  You have more redness, swelling, or pain  around your abscess.  You have more fluid or blood coming from your abscess.  Your abscess feels warm when you touch it.  You have more pus or a bad smell coming from your abscess.  You have a fever.  Your muscles ache.  You have chills.  You feel sick. Get help right away if:  You have very bad (severe) pain.  You see red streaks on your skin spreading away from the abscess. Summary  A skin abscess is an infected area of your skin that contains pus and other material.  The abscess is caused by germs that enter the skin through a cut or scrape. It can also be caused by blocked oil and sweat glands or infected hair follicles.  Follow your doctor's instructions on caring for your abscess, taking medicines, preventing infections, and keeping follow-up visits. This information is not intended to replace advice given to you by your health care provider. Make sure you discuss any questions you have with your health care provider. Document Revised: 09/22/2018 Document Reviewed: 04/01/2017 Elsevier Patient Education  2020 [employer].   Continue to use warm compresses daily

## 2019-07-20 NOTE — Assessment & Plan Note (Signed)
Patient with abscess in RUE below her elbow. Does not seem to infect joint. Full ROM of elbow and hand. TTP of boil. No warmth to touch. No fluctuance so unable to perform I&D at this time. Will advise warm compresses daily and treat with PO doxycyline (initially thought bactrim but after review of BMP this was changed to doxycyline - I called and discussed this with patient prior to her picking up antibiotics). Patient has a f/u visit on Monday, at that time can re-assess to see if there is any improvement. Can consider I&D at that time pending fluctuance. Follow up on Monday, sooner if worsening. Discussed after hours emergency line with patient.

## 2019-07-20 NOTE — Progress Notes (Signed)
    SUBJECTIVE:   CHIEF COMPLAINT / HPI:   Boil on arm  Patient reports she has a "boil" on her right arm right below elbow. Reports this started 2-3 days ago. Has had one previously 1 month ago in the same spot. Reports tenderness around the area. Has not tried anything at home. Denies trauma to the area or any scratches. Does not remember any insect bites. No history of diabetes. Reports no current drainage. No fevers. Does have a boil on her vaginal area but does not want to discuss with me.   PERTINENT  PMH / PSH: NSTEMI, HTN, HLD, obesity, tobacco use  OBJECTIVE:   BP (!) 142/80   Pulse 88   Ht 5\' 6"  (1.676 m)   Wt 276 lb (125.2 kg)   SpO2 95%   BMI 44.55 kg/m   Gen: awake and alert, NAD Ext: full ROM, normal grip strength, 2+ pulse  Skin:     ASSESSMENT/PLAN:   Skin abscess Patient with abscess in RUE below her elbow. Does not seem to infect joint. Full ROM of elbow and hand. TTP of boil. No warmth to touch. No fluctuance so unable to perform I&D at this time. Will advise warm compresses daily and treat with PO doxycyline (initially thought bactrim but after review of BMP this was changed to doxycyline - I called and discussed this with patient prior to her picking up antibiotics). Patient has a f/u visit on Monday, at that time can re-assess to see if there is any improvement. Can consider I&D at that time pending fluctuance. Follow up on Monday, sooner if worsening. Discussed after hours emergency line with patient.      Sunday, DO [facility]

## 2019-07-21 ENCOUNTER — Ambulatory Visit (INDEPENDENT_AMBULATORY_CARE_PROVIDER_SITE_OTHER): Payer: Medicaid Other

## 2019-07-21 ENCOUNTER — Ambulatory Visit (INDEPENDENT_AMBULATORY_CARE_PROVIDER_SITE_OTHER): Payer: Medicaid Other | Admitting: Podiatry

## 2019-07-21 ENCOUNTER — Other Ambulatory Visit: Payer: Self-pay | Admitting: Podiatry

## 2019-07-21 DIAGNOSIS — S92515A Nondisplaced fracture of proximal phalanx of left lesser toe(s), initial encounter for closed fracture: Secondary | ICD-10-CM | POA: Diagnosis not present

## 2019-07-21 DIAGNOSIS — S92502A Displaced unspecified fracture of left lesser toe(s), initial encounter for closed fracture: Secondary | ICD-10-CM

## 2019-07-21 DIAGNOSIS — M79672 Pain in left foot: Secondary | ICD-10-CM

## 2019-07-21 DIAGNOSIS — M79675 Pain in left toe(s): Secondary | ICD-10-CM

## 2019-07-23 NOTE — Progress Notes (Signed)
  Subjective:  Patient ID: Tammy [name], female    DOB: 28-Nov-1955,  MRN: [id number]  Chief Complaint  Patient presents with  . Toe Pain    Pt states right 4th digit pain after a fall 2+ weeks ago.   . Callouses    Bilateral sub 5th plantar callouses, pt requests trim.    64 y.o. female presents with the above complaint. History confirmed with patient.   Objective:  Physical Exam: warm, good capillary refill, no trophic changes or ulcerative lesions, normal DP and PT pulses and normal sensory exam. Right Foot: Pedal patient has a right fourth toe and right fourth MPJ  No images are attached to the encounter.  Radiographs: X-ray of the right foot: Fracture right proximal phalangeal base Assessment:   1. Closed nondisplaced fracture of proximal phalanx of lesser toe of left foot, initial encounter   2. Pain and swelling of toe, left      Plan:  Patient was evaluated and treated and all questions answered.  Fracture 4th toe left -XR Reviewed with patient, -Would benefit from trial of non-operative management for this injury. -WBAT in surgical shoe  Return in about 6 weeks (around 09/01/2019) for Fracture f/u.

## 2019-07-24 ENCOUNTER — Encounter: Payer: Self-pay | Admitting: Family Medicine

## 2019-07-24 ENCOUNTER — Telehealth: Payer: Self-pay

## 2019-07-24 ENCOUNTER — Other Ambulatory Visit: Payer: Self-pay

## 2019-07-24 ENCOUNTER — Other Ambulatory Visit: Payer: Self-pay | Admitting: Family Medicine

## 2019-07-24 ENCOUNTER — Ambulatory Visit (INDEPENDENT_AMBULATORY_CARE_PROVIDER_SITE_OTHER): Payer: Medicaid Other | Admitting: Family Medicine

## 2019-07-24 VITALS — BP 160/80 | HR 69 | Wt 270.4 lb

## 2019-07-24 DIAGNOSIS — I10 Essential (primary) hypertension: Secondary | ICD-10-CM | POA: Diagnosis not present

## 2019-07-24 DIAGNOSIS — M79671 Pain in right foot: Secondary | ICD-10-CM

## 2019-07-24 DIAGNOSIS — L02818 Cutaneous abscess of other sites: Secondary | ICD-10-CM

## 2019-07-24 DIAGNOSIS — S46812S Strain of other muscles, fascia and tendons at shoulder and upper arm level, left arm, sequela: Secondary | ICD-10-CM | POA: Diagnosis not present

## 2019-07-24 DIAGNOSIS — L02211 Cutaneous abscess of abdominal wall: Secondary | ICD-10-CM

## 2019-07-24 DIAGNOSIS — M542 Cervicalgia: Secondary | ICD-10-CM

## 2019-07-24 DIAGNOSIS — Z1231 Encounter for screening mammogram for malignant neoplasm of breast: Secondary | ICD-10-CM

## 2019-07-24 MED ORDER — BACLOFEN 10 MG PO TABS: 10.0000 mg | ORAL_TABLET | Freq: Three times a day (TID) | ORAL | 0 refills | Status: DC

## 2019-07-24 MED ORDER — LOSARTAN POTASSIUM 25 MG PO TABS: 25.0000 mg | ORAL_TABLET | Freq: Every day | ORAL | 3 refills | Status: DC

## 2019-07-24 MED ORDER — CLINDAMYCIN PHOSPHATE 1 % EX FOAM: CUTANEOUS | 0 refills | Status: DC

## 2019-07-24 MED ORDER — CHLORHEXIDINE GLUCONATE 4 % EX LIQD: Freq: Every day | CUTANEOUS | 0 refills | Status: DC | PRN

## 2019-07-24 MED ORDER — MUPIROCIN 2 % EX OINT: TOPICAL_OINTMENT | CUTANEOUS | 0 refills | Status: DC

## 2019-07-24 NOTE — Telephone Encounter (Signed)
Please call pharmacy---can change to Cleocin T.   [name], MD  Family Medicine Teaching Service

## 2019-07-24 NOTE — Assessment & Plan Note (Addendum)
Discussed component of weight that may be contributing.  Recommended not irritating the area.  Cotton garments.  Hibiclens for the contamination.  Can use mupirocin on the right elbow lesion.  Prescription for clindamycin solution.  Recommended against shaving.  Differential for her recurrent abscesses includes prurigo nodularis given the location, hidradenitis suppurativa or recurrent folliculitis from shaving.  I suspect the latter is the most likely.

## 2019-07-24 NOTE — Patient Instructions (Addendum)
It was wonderful to see you today.  Please bring ALL of your medications with you to every visit.   Thank you for choosing [facility].   Please call [phone] with any questions about today's appointment.  [name], MD  Family Medicine

## 2019-07-24 NOTE — Assessment & Plan Note (Signed)
Following with podiatry.

## 2019-07-24 NOTE — Telephone Encounter (Signed)
Patient calls nurse line stating that medicaid does not cover clindamycin phosphate foam. Please see below list of preferred drugs. Please let  RN team know if PA is needed or if another alternative is appropriate.   Patient also states that medicaid will not cover chlorhexidine because it is over the counter. Patient states that there is another option OTC that is cheaper.  To Dr. Manson Passey.   Please advise  Veronda Prude, RN

## 2019-07-24 NOTE — Telephone Encounter (Signed)
Spoke with Weston Brass, gave verbal okay to switch to cleocin T 60g.   Veronda Prude, RN

## 2019-07-24 NOTE — Assessment & Plan Note (Signed)
Improved but not at goal.  Repeat BMP today if creatinine remains relatively stable increase losartan to 50 mg.

## 2019-07-24 NOTE — Assessment & Plan Note (Signed)
Suspect cervical disc disease versus trapezius strain.  Discussed side effects of muscle relaxants and addictive properties.  She does not use alcohol- small prescription for baclofen to try.

## 2019-07-24 NOTE — Progress Notes (Signed)
    SUBJECTIVE:   CHIEF COMPLAINT / HPI:   Tammy [name] is a pleasant 64 year old woman with history significant for hypertension, obesity, coronary artery disease, peripheral arterial disease and tobacco presenting today for follow-up.  The patient reports to she is overall doing okay.  She feels very stressed that she has had recurrent abscesses.  The patient reports a longstanding history of intermittent abscesses in her pelvic area.  She does shave regularly.  She has been using Dial soap.  She has also been putting Neosporin on the areas.  Recently she had a periumbilical lesion that bled on her undergarments.  She denies fevers or signs of systemic infection.  She was recently seen for right elbow abscess she is currently on doxycycline.  Patient reports compliance with her blood pressure medications.  She denies chest pain or headache today.  The patient has a history of intermittent neck spasms.  She also has intermittent back spasms for which she takes Flexeril.  She is requesting a refill.  She is amenable to an alternative medication.  She does not use alcohol.  She reports left-sided neck spasms and tenderness to palpation over her left trapezius.  She denies pain with exertion or shoulder pain.  She denies radiating pain down her arms  PERTINENT  PMH / PSH/Family/Social History :  Hypertension Tobacco abuse Obesity Coronary artery disease with moderate triple vessel disease (followed with Dr. Delton See)   Cardiac Cath 2016 1. Diffuse moderate triple vessel CAD without focal culprit lesion for NSTEMI.  2. Moderate stenosis mid RCA which does not appear to be a ruptured plaque and is not flow limiting 3. Moderate stenosis in the small caliber distal LAD which does not appear to be a ruptured plaque and is not flow limiting. 4. Moderate disease in the small caliber third obtuse marginal branch 5. Normal LV function 6. NSTEMI with diffuse moderate CAD but no focal culprit lesion  OBJECTIVE:   BP (!) 160/80   Pulse 69   Wt 270 lb 6.4 oz (122.7 kg)   SpO2 97%   BMI 43.64 kg/m   HEENT: Sclera anicteric. Dentition is moderate. Appears well hydrated. Neck: Supple Cardiac: Regular rate and rhythm. Normal S1/S2. No murmurs, rubs, or gallops appreciated. Lungs: Clear bilaterally to ascultation.   Left foot in a walking shoe with a stiff sole.  Buddy taping of digits 3 and 4 of the right foot.  No ulcers or injury.  Mildly tender to palpation.  ASSESSMENT/PLAN:   Skin abscess Discussed component of weight that may be contributing.  Recommended not irritating the area.  Cotton garments.  Hibiclens for the contamination.  Can use mupirocin on the right elbow lesion.  Prescription for clindamycin solution.  Recommended against shaving.  Differential for her recurrent abscesses includes prurigo nodularis given the location, hidradenitis suppurativa or recurrent folliculitis from shaving.  I suspect the latter is the most likely.  Foot pain, right Following with podiatry.  HYPERTENSION, BENIGN SYSTEMIC Improved but not at goal.  Repeat BMP today if creatinine remains relatively stable increase losartan to 50 mg.  Neck pain Suspect cervical disc disease versus trapezius strain.  Discussed side effects of muscle relaxants and addictive properties.  She does not use alcohol- small prescription for baclofen to try.     Tammy Starr, MD  Family Medicine Teaching Service  [facility]

## 2019-07-25 ENCOUNTER — Telehealth: Payer: Self-pay | Admitting: Family Medicine

## 2019-07-25 ENCOUNTER — Encounter: Payer: Self-pay | Admitting: Family Medicine

## 2019-07-25 DIAGNOSIS — I10 Essential (primary) hypertension: Secondary | ICD-10-CM

## 2019-07-25 LAB — BASIC METABOLIC PANEL
BUN/Creatinine Ratio: 16 (ref 12–28)
BUN: 20 mg/dL (ref 8–27)
CO2: 20 mmol/L (ref 20–29)
Calcium: 9.3 mg/dL (ref 8.7–10.3)
Chloride: 108 mmol/L — ABNORMAL HIGH (ref 96–106)
Creatinine, Ser: 1.25 mg/dL — ABNORMAL HIGH (ref 0.57–1.00)
GFR calc Af Amer: 53 mL/min/{1.73_m2} — ABNORMAL LOW (ref >59)
GFR calc non Af Amer: 46 mL/min/{1.73_m2} — ABNORMAL LOW (ref >59)
Glucose: 137 mg/dL — ABNORMAL HIGH (ref 65–99)
Potassium: 3.9 mmol/L (ref 3.5–5.2)
Sodium: 143 mmol/L (ref 134–144)

## 2019-07-25 MED ORDER — LOSARTAN POTASSIUM 50 MG PO TABS: 50.0000 mg | ORAL_TABLET | Freq: Every day | ORAL | 3 refills | Status: DC

## 2019-07-25 NOTE — Telephone Encounter (Signed)
Called patient. Creatinine near baseline with addition of ARB.   Increased losartan to 50 mg nightly. Repeat BMP in 1-2 weeks.   All questions answered about medication.  [name], MD  Family Medicine Teaching Service

## 2019-07-27 ENCOUNTER — Telehealth: Payer: Self-pay | Admitting: Family Medicine

## 2019-07-27 DIAGNOSIS — I10 Essential (primary) hypertension: Secondary | ICD-10-CM

## 2019-07-27 MED ORDER — CYCLOBENZAPRINE HCL 5 MG PO TABS
5.0000 mg | ORAL_TABLET | Freq: Three times a day (TID) | ORAL | 2 refills | Status: DC | PRN
Start: 2019-07-27 — End: 2019-10-30

## 2019-07-27 NOTE — Telephone Encounter (Signed)
Patient calling to let Dr. Manson Passey know she is still having pains in the L side of her neck. Patient states she knows shes not supposed to take Aleve on the regular, but she took 1 tablet the other day and its the only thing that will relieve her pain and stress with it. Patient also says she is having headaches with this neck pain and she normally does not have very many headaches. Please call patient at [phone] for further discussion.

## 2019-07-27 NOTE — Addendum Note (Signed)
Addended by: Manson Passey, Jovanni Eckhart on: 07/27/2019 02:32 PM   Modules accepted: Orders

## 2019-07-27 NOTE — Telephone Encounter (Signed)
Called patient about headache. Patient has had intermittent neck pain on left side pain.  She has had this for several weeks now previously in the past.  This typically resolves with NSAIDs which she is not taking more due to her kidney disease.  She has tried baclofen heat and ice.  Recommended stretching ice. Discussed returning to use of Flexeril, discussed side effects and risk of daily medication.  A total of 10 pills was prescribed with 3 refills.  Discussed this must get her through 3 months.  In addition to above we will prescribe blood pressure cuff.  Nursing can you please call pharmacy the patient reports that clindamycin foam not covered.   This was reported to be preferred by her formulary coverage.  Please let patient know about medication status/issues. Please let me know if there is an issue with Rx at pharmacy or if any questions.  Thank you, [name], MD  [facility] Medicine Teaching Service

## 2019-07-28 ENCOUNTER — Telehealth: Payer: Self-pay

## 2019-07-28 NOTE — Addendum Note (Signed)
Addended by: Manson Passey, Dodi Leu on: 07/28/2019 08:12 AM   Modules accepted: Orders

## 2019-07-28 NOTE — Telephone Encounter (Signed)
Community message sent to [employer] for processing of DME blood pressure cuff. Will await response.   Veronda Prude, RN

## 2019-08-01 ENCOUNTER — Telehealth: Payer: Self-pay

## 2019-08-01 DIAGNOSIS — L02818 Cutaneous abscess of other sites: Secondary | ICD-10-CM

## 2019-08-01 MED ORDER — CLINDAMYCIN PHOSPHATE 1 % EX SOLN: Freq: Two times a day (BID) | CUTANEOUS | 0 refills | Status: DC

## 2019-08-01 NOTE — Telephone Encounter (Signed)
Alternative sent to pharmacy.  Romanda Turrubiates, MD  Family Medicine Teaching Service   

## 2019-08-01 NOTE — Telephone Encounter (Signed)
PA needed on Clindamycin Phosphate 1% Gel. Clinical questions placed in providers box for review.

## 2019-08-02 ENCOUNTER — Encounter: Payer: Self-pay | Admitting: Family Medicine

## 2019-08-04 ENCOUNTER — Telehealth: Payer: Self-pay | Admitting: Family Medicine

## 2019-08-04 DIAGNOSIS — I10 Essential (primary) hypertension: Secondary | ICD-10-CM

## 2019-08-04 NOTE — Telephone Encounter (Signed)
LMOVM for pt to call back to schedule a lab appt. Emiliano Welshans Bruna Potter, CMA

## 2019-08-04 NOTE — Telephone Encounter (Signed)
Patient needs repeat labs for new medication (losartan). Nursing- can you please reach out and help to schedule for lab visit next week?  Thank you, [name], MD  [facility] Medicine Teaching Service

## 2019-08-07 ENCOUNTER — Other Ambulatory Visit: Payer: Self-pay

## 2019-08-07 ENCOUNTER — Other Ambulatory Visit: Payer: Medicaid Other

## 2019-08-07 DIAGNOSIS — I10 Essential (primary) hypertension: Secondary | ICD-10-CM

## 2019-08-08 LAB — BASIC METABOLIC PANEL
BUN/Creatinine Ratio: 18 (ref 12–28)
BUN: 19 mg/dL (ref 8–27)
CO2: 22 mmol/L (ref 20–29)
Calcium: 9 mg/dL (ref 8.7–10.3)
Chloride: 102 mmol/L (ref 96–106)
Creatinine, Ser: 1.08 mg/dL — ABNORMAL HIGH (ref 0.57–1.00)
GFR calc Af Amer: 63 mL/min/{1.73_m2} (ref >59)
GFR calc non Af Amer: 55 mL/min/{1.73_m2} — ABNORMAL LOW (ref >59)
Glucose: 103 mg/dL — ABNORMAL HIGH (ref 65–99)
Potassium: 4.4 mmol/L (ref 3.5–5.2)
Sodium: 138 mmol/L (ref 134–144)

## 2019-08-10 ENCOUNTER — Telehealth: Payer: Self-pay | Admitting: Family Medicine

## 2019-08-10 NOTE — Telephone Encounter (Signed)
Called with normal results. All questions answered. Has follow up scheduled.   [name], MD  Family Medicine Teaching Service

## 2019-08-22 ENCOUNTER — Ambulatory Visit
Admission: RE | Admit: 2019-08-22 | Discharge: 2019-08-22 | Disposition: A | Discharge status: Home / Self Care | Payer: Medicaid Other | Source: Ambulatory Visit | Attending: Family Medicine | Admitting: Family Medicine

## 2019-08-22 ENCOUNTER — Other Ambulatory Visit: Payer: Self-pay

## 2019-08-22 DIAGNOSIS — Z1231 Encounter for screening mammogram for malignant neoplasm of breast: Secondary | ICD-10-CM

## 2019-08-23 DIAGNOSIS — Z23 Encounter for immunization: Secondary | ICD-10-CM | POA: Diagnosis not present

## 2019-08-25 ENCOUNTER — Encounter: Payer: Self-pay | Admitting: Family Medicine

## 2019-08-25 ENCOUNTER — Other Ambulatory Visit: Payer: Self-pay

## 2019-08-25 ENCOUNTER — Ambulatory Visit (INDEPENDENT_AMBULATORY_CARE_PROVIDER_SITE_OTHER): Payer: Medicaid Other | Admitting: Family Medicine

## 2019-08-25 VITALS — BP 141/65 | HR 61 | Ht 66.0 in | Wt 276.0 lb

## 2019-08-25 DIAGNOSIS — Z1211 Encounter for screening for malignant neoplasm of colon: Secondary | ICD-10-CM

## 2019-08-25 DIAGNOSIS — Z1212 Encounter for screening for malignant neoplasm of rectum: Secondary | ICD-10-CM | POA: Diagnosis not present

## 2019-08-25 DIAGNOSIS — L02818 Cutaneous abscess of other sites: Secondary | ICD-10-CM | POA: Diagnosis not present

## 2019-08-25 DIAGNOSIS — I10 Essential (primary) hypertension: Secondary | ICD-10-CM

## 2019-08-25 DIAGNOSIS — I2510 Atherosclerotic heart disease of native coronary artery without angina pectoris: Secondary | ICD-10-CM

## 2019-08-25 MED ORDER — CLINDAMYCIN PHOSPHATE 1 % EX SOLN: Freq: Two times a day (BID) | CUTANEOUS | 0 refills | Status: DC

## 2019-08-25 MED ORDER — METOPROLOL TARTRATE 100 MG PO TABS: 100.0000 mg | ORAL_TABLET | Freq: Two times a day (BID) | ORAL | 2 refills | Status: DC

## 2019-08-25 NOTE — Assessment & Plan Note (Signed)
Nearer to goal---consider increasing ARB at follow up--ideally goal <120/80 given risk factors Encouraged tobacco cessation BMP today (ARB increased at last visit) Follow up in 4-6 weeks to discuss tobacco, mood, and HTN

## 2019-08-25 NOTE — Assessment & Plan Note (Signed)
Cardiology follow up September 2021. Asymptomatic. BP markedly improved, encouraged smoking cessation.

## 2019-08-25 NOTE — Progress Notes (Signed)
    SUBJECTIVE:   CHIEF COMPLAINT / HPI:   Tammy [name] is a 64 year old with history of HTN, CAD, tobacco abuse, and PAD presenting for follow up of blood pressure.  Patient reports some stress at home. She cares for her elderly father who goes to dialysis and has bilateral amputations. She cares for him during the daytime, her sister at night. Reports general 'stress'. Denies low mood, SI/HI. Reports she is 'managing'.   HTN Patient reports compliance with ARB (losartan 50 mg). Needs refill on metoprolol. Compliant with indapamide and amlodipine. Denies chest pain, dyspnea, headache, vision changes.  Foot Pain  The patient reports intermittent right foot pain, minimal but worse with exertion. Still in stiff sole shoe. Denies numbness or tingling.  Patient reports improvement in folliculitis of ingrown hair. Clindamycin working well, refill requested.     PERTINENT  PMH / PSH/Family/Social History :  PAD Hypertension  Tobacco abuse   OBJECTIVE:   BP (!) 141/65   Pulse 61   Ht 5\' 6"  (1.676 m)   Wt 276 lb (125.2 kg)   SpO2 96%   BMI 44.55 kg/m   HEENT: Sclera anicteric. Dentition is moderate. Appears well hydrated. Neck: Supple Cardiac: Regular rate and rhythm. Normal S1/S2. No murmurs, rubs, or gallops appreciated. Lungs: Clear bilaterally to ascultation.  Skin: Warm, dry Psych: Pleasant and appropriate  Right foot minimal TTP along fourth metatarsal.    ASSESSMENT/PLAN:   CAD (coronary artery disease) Cardiology follow up September 2021. Asymptomatic. BP markedly improved, encouraged smoking cessation.     HCM - Colonoscopy referral placed   Tdap and COVID vaccine at followup   October 2021, MD  Family Medicine Teaching Service  [facility]

## 2019-08-25 NOTE — Patient Instructions (Signed)
It was wonderful to see you today.  Please bring ALL of your medications with you to every visit.   Today we talked about:  - Your blood pressure looks great today   I will call you with blood work   You should be called about the colonoscopy    Thank you for choosing [facility].   Please call [phone] with any questions about today's appointment.  Please be sure to schedule follow up at the front  desk before you leave today.   [name], MD  Family Medicine   Tobacco use is damaging to your body. It increases your risk of stroke, heart attack, lung cancer, and serious lung disease in the future. It also reduces your fertility.   Quitting tobacco is the best thing for your health but is a challenge---nicotine, a chemical in cigarettes, is highly addictive.   You can call 1 800 QUIT NOW (947-553-4551)---you will be connected with a Careers information officer. They can also mail you nicotine gums, lozenges, and patches to quit.   Ask me about patches (which you wear all day) and gums (which you use when you have a craving) to help you quit.   There are safe, effective medications to help you quit--  Varencline---also called Chantix---- is the most common medication used to help people stop smoking. It starts a low dose and is increased. I recommended choosing a quit date then starting the medication 8 days before this. Side effects include mild headache, difficulty sleeping, and odd dreams. The medication is typically very well tolerated.     Bupropion---also called Zyban---- is started 1 week before your quit date. You take 1 pill for three days then increase to 1 pill twice per day. Side effects include a mild headache and anxiety---this usually goes away. Some patients experience weight loss.         Mediterranean Diet  Why follow it? Research shows. . Those who follow the Mediterranean diet have a reduced risk of heart disease  . The diet is associated  with a reduced incidence of Parkinson's and Alzheimer's diseases . People following the diet may have longer life expectancies and lower rates of chronic diseases  . The Dietary Guidelines for Americans recommends the Mediterranean diet as an eating plan to promote health and prevent disease  What Is the Mediterranean Diet?  . Healthy eating plan based on typical foods and recipes of Mediterranean-style cooking . The diet is primarily a plant based diet; these foods should make up a majority of meals   Starches - Plant based foods should make up a majority of meals - They are an important sources of vitamins, minerals, energy, antioxidants, and fiber - Choose whole grains, foods high in fiber and minimally processed items  - Typical grain sources include wheat, oats, barley, corn, Ailana Cuadrado rice, bulgar, farro, millet, polenta, couscous  - Various types of beans include chickpeas, lentils, fava beans, black beans, white beans   Fruits  Veggies - Large quantities of antioxidant rich fruits & veggies; 6 or more servings  - Vegetables can be eaten raw or lightly drizzled with oil and cooked  - Vegetables common to the traditional Mediterranean Diet include: artichokes, arugula, beets, broccoli, brussel sprouts, cabbage, carrots, celery, collard greens, cucumbers, eggplant, kale, leeks, lemons, lettuce, mushrooms, okra, onions, peas, peppers, potatoes, pumpkin, radishes, rutabaga, shallots, spinach, sweet potatoes, turnips, zucchini - Fruits common to the Mediterranean Diet include: apples, apricots, avocados, cherries, clementines, dates, figs, grapefruits, grapes, melons,  nectarines, oranges, peaches, pears, pomegranates, strawberries, tangerines  Fats - Replace butter and margarine with healthy oils, such as olive oil, canola oil, and tahini  - Limit nuts to no more than a handful a day  - Nuts include walnuts, almonds, pecans, pistachios, pine nuts  - Limit or avoid candied, honey roasted or heavily  salted nuts - Olives are central to the Mediterranean diet - can be eaten whole or used in a variety of dishes   Meats Protein - Limiting red meat: no more than a few times a month - When eating red meat: choose lean cuts and keep the portion to the size of deck of cards - Eggs: approx. 0 to 4 times a week  - Fish and lean poultry: at least 2 a week  - Healthy protein sources include, chicken, [place], lean beef, lamb - Increase intake of seafood such as tuna, salmon, trout, mackerel, shrimp, scallops - Avoid or limit high fat processed meats such as sausage and bacon  Dairy - Include moderate amounts of low fat dairy products  - Focus on healthy dairy such as fat free yogurt, skim milk, low or reduced fat cheese - Limit dairy products higher in fat such as whole or 2% milk, cheese, ice cream  Alcohol - Moderate amounts of red wine is ok  - No more than 5 oz daily for women (all ages) and men older than age 91  - No more than 10 oz of wine daily for men younger than 47  Other - Limit sweets and other desserts  - Use herbs and spices instead of salt to flavor foods  - Herbs and spices common to the traditional Mediterranean Diet include: basil, bay leaves, chives, cloves, cumin, fennel, garlic, lavender, marjoram, mint, oregano, parsley, pepper, rosemary, sage, savory, sumac, tarragon, thyme   It's not just a diet, it's a lifestyle:  . The Mediterranean diet includes lifestyle factors typical of those in the region  . Foods, drinks and meals are best eaten with others and savored . Daily physical activity is important for overall good health . This could be strenuous exercise like running and aerobics . This could also be more leisurely activities such as walking, housework, yard-work, or taking the stairs . Moderation is the key; a balanced and healthy diet accommodates most foods and drinks . Consider portion sizes and frequency of consumption of certain foods   Meal Ideas & Options:   . Breakfast:  o Whole wheat toast or whole wheat English muffins with peanut butter & hard boiled egg o Steel cut oats topped with apples & cinnamon and skim milk  o Fresh fruit: banana, strawberries, melon, berries, peaches  o Smoothies: strawberries, bananas, greek yogurt, peanut butter o Low fat greek yogurt with blueberries and granola  o Egg white omelet with spinach and mushrooms o Breakfast couscous: whole wheat couscous, apricots, skim milk, cranberries  . Sandwiches:  o Hummus and grilled vegetables (peppers, zucchini, squash) on whole wheat bread   o Grilled chicken on whole wheat pita with lettuce, tomatoes, cucumbers or tzatziki  o Tuna salad on whole wheat bread: tuna salad made with greek yogurt, olives, red peppers, capers, green onions o Garlic rosemary lamb pita: lamb sauted with garlic, rosemary, salt & pepper; add lettuce, cucumber, greek yogurt to pita - flavor with lemon juice and black pepper  . Seafood:  o Mediterranean grilled salmon, seasoned with garlic, basil, parsley, lemon juice and black pepper o Shrimp, lemon, and spinach whole-grain  pasta salad made with low fat greek yogurt  o Seared scallops with lemon orzo  o Seared tuna steaks seasoned salt, pepper, coriander topped with tomato mixture of olives, tomatoes, olive oil, minced garlic, parsley, green onions and cappers  . Meats:  o Herbed greek chicken salad with kalamata olives, cucumber, feta  o Red bell peppers stuffed with spinach, bulgur, lean ground beef (or lentils) & topped with feta   o Kebabs: skewers of chicken, tomatoes, onions, zucchini, squash  o [place] burgers: made with red onions, mint, dill, lemon juice, feta cheese topped with roasted red peppers . Vegetarian o Cucumber salad: cucumbers, artichoke hearts, celery, red onion, feta cheese, tossed in olive oil & lemon juice  o Hummus and whole grain pita points with a greek salad (lettuce, tomato, feta, olives, cucumbers, red onion) o Lentil  soup with celery, carrots made with vegetable broth, garlic, salt and pepper  o Tabouli salad: parsley, bulgur, mint, scallions, cucumbers, tomato, radishes, lemon juice, olive oil, salt and pepper.

## 2019-08-26 LAB — BASIC METABOLIC PANEL
BUN/Creatinine Ratio: 19 (ref 12–28)
BUN: 20 mg/dL (ref 8–27)
CO2: 18 mmol/L — ABNORMAL LOW (ref 20–29)
Calcium: 8.9 mg/dL (ref 8.7–10.3)
Chloride: 105 mmol/L (ref 96–106)
Creatinine, Ser: 1.07 mg/dL — ABNORMAL HIGH (ref 0.57–1.00)
GFR calc Af Amer: 64 mL/min/{1.73_m2} (ref >59)
GFR calc non Af Amer: 55 mL/min/{1.73_m2} — ABNORMAL LOW (ref >59)
Glucose: 106 mg/dL — ABNORMAL HIGH (ref 65–99)
Potassium: 4.3 mmol/L (ref 3.5–5.2)
Sodium: 137 mmol/L (ref 134–144)

## 2019-08-29 ENCOUNTER — Telehealth: Payer: Self-pay | Admitting: Family Medicine

## 2019-08-29 NOTE — Telephone Encounter (Signed)
Called with results. Normal BMP, mild acidosis, encouraged to take PO.   [name], MD  Family Medicine Teaching Service

## 2019-09-06 ENCOUNTER — Other Ambulatory Visit: Payer: Self-pay

## 2019-09-06 DIAGNOSIS — L02818 Cutaneous abscess of other sites: Secondary | ICD-10-CM

## 2019-09-06 MED ORDER — CLINDAMYCIN PHOSPHATE 1 % EX SOLN: Freq: Two times a day (BID) | CUTANEOUS | 0 refills | Status: DC

## 2019-09-08 ENCOUNTER — Ambulatory Visit (INDEPENDENT_AMBULATORY_CARE_PROVIDER_SITE_OTHER): Payer: Medicaid Other | Admitting: Podiatry

## 2019-09-08 ENCOUNTER — Other Ambulatory Visit: Payer: Self-pay

## 2019-09-08 ENCOUNTER — Encounter: Payer: Self-pay | Admitting: Podiatry

## 2019-09-08 ENCOUNTER — Ambulatory Visit (INDEPENDENT_AMBULATORY_CARE_PROVIDER_SITE_OTHER): Payer: Medicaid Other

## 2019-09-08 VITALS — Temp 96.0°F

## 2019-09-08 DIAGNOSIS — M778 Other enthesopathies, not elsewhere classified: Secondary | ICD-10-CM

## 2019-09-08 DIAGNOSIS — S92515D Nondisplaced fracture of proximal phalanx of left lesser toe(s), subsequent encounter for fracture with routine healing: Secondary | ICD-10-CM | POA: Diagnosis not present

## 2019-09-08 DIAGNOSIS — M7752 Other enthesopathy of left foot: Secondary | ICD-10-CM

## 2019-09-08 MED ORDER — DICLOFENAC SODIUM 1 % EX GEL: 4.0000 g | Freq: Four times a day (QID) | CUTANEOUS | 2 refills | Status: DC

## 2019-09-08 NOTE — Progress Notes (Signed)
°  Subjective:  Patient ID: Tammy [name], female    DOB: 1956/02/25,  MRN: [id number]  Chief Complaint  Patient presents with   Foot Injury    Follow-up; Right foot; pt stated, "My toe feels fine; but feels pulling sensation on top of foot - below 4th toe; foot was swollen yesterday; did a lot of driving yesterday"    64 y.o. female presents with the above complaint. History confirmed with patient.  Her toe is feeling better.  She has new pain from a prolonged trip driving that she localizes to the dorsal third and fourth metatarsal area.  Objective:  Physical Exam: warm, good capillary refill, no trophic changes or ulcerative lesions, normal DP and PT pulses and normal sensory exam.  Right Foot: No pain along the fourth toe over the fourth MTPJ.  Mild pain with passive plantarflexion stretch of the extensor tendon complex.  Assessment:   1. Closed nondisplaced fracture of proximal phalanx of lesser toe of left foot with routine healing, subsequent encounter   2. Extensor tendinitis of foot      Plan:  Patient was evaluated and treated and all questions answered.  -Toe fracture is healed well continue wearing regular shoes as tolerated.  Transition out of surgical shoe. -She has mild extensor tendinitis.  I believe this is self-limiting and will resolve.  Reviewed RICE protocol for tendinitis -Voltaren gel recommended for pain control -Physical therapy recommended as well.  Will be forwarded to [facility], DPM 09/08/2019    Return in about 6 weeks (around 10/20/2019) for to re-check tendinitis after PT.

## 2019-09-08 NOTE — Patient Instructions (Signed)
Tendinitis  Tendinitis is swelling (inflammation) of a tendon. A tendon is a cord of tissue that connects muscle to bone. Tendinitis can cause pain, tenderness, and swelling. What are the causes?  Using a tendon or muscle too much (overuse). This is a common cause.  Wear and tear that happens as you age.  Injury.  Some medical conditions, such as arthritis.  Some medicines. What increases the risk? You are more likely to get this condition if you do activities that involve the same movements over and over again (repetitive motions). What are the signs or symptoms?  Pain.  Tenderness.  Mild swelling.  Decreased range of motion. How is this treated? This condition is usually treated with RICE therapy. RICE stands for:  Rest.  Ice.  Compression. This means putting pressure on the affected area.  Elevation. This means raising the affected area above the level of your heart. Treatment may also include:  Medicines for swelling or pain.  Exercises or physical therapy.  A brace or splint.  Surgery. This is rarely needed. Follow these instructions at home: If you have a splint or brace:  Wear the splint or brace as told by your doctor. Remove it only as told by your doctor.  Loosen the splint or brace if your fingers or toes: ? Tingle. ? Become numb. ? Turn cold and blue.  Keep the splint or brace clean.  If the splint or brace is not waterproof: ? Do not let it get wet. ? Cover it with a watertight covering when you take a bath or shower. Managing pain, stiffness, and swelling      If told, put ice on the affected area. ? If you have a removable splint or brace, remove it as told by your doctor. ? Put ice in a plastic bag. ? Place a towel between your skin and the bag. ? Leave the ice on for 20 minutes, 2-3 times a day.  Move the fingers or toes of the affected arm or leg often, if this applies. This helps to prevent stiffness and to lessen  swelling.  If told, raise the affected area above the level of your heart while you are sitting or lying down.  If told, put heat on the affected area before you exercise. Use the heat source that your doctor recommends, such as a moist heat pack or a heating pad. ? Place a towel between your skin and the heat source. ? Leave the heat on for 20-30 minutes. ? Remove the heat if your skin turns bright red. This is very important if you are unable to feel pain, heat, or cold. You may have a greater risk of getting burned. Driving  Do not drive or use heavy machinery while taking prescription pain medicine.  Ask your doctor when it is safe to drive if you have a splint or brace on any part of your arm or leg. Activity  Rest the affected area as told by your doctor.  Return to your normal activities as told by your doctor. Ask your doctor what activities are safe for you.  Avoid using the affected area while you have symptoms.  Do exercises as told by your doctor. General instructions  If you have a splint, do not put pressure on any part of the splint until it is fully hardened. This may take several hours.  Wear an elastic bandage or pressure (compression) wrap only as told by your doctor.  Take over-the-counter and prescription medicines only as   told by your doctor.  Keep all follow-up visits as told by your doctor. This is important. Contact a doctor if:  You do not get better.  You get new problems, such as numbness in your hands, and you do not know why. Summary  Tendinitis is swelling (inflammation) of a tendon.  You are more likely to get this condition if you do activities that involve the same movements over and over again.  This condition is usually treated with RICE therapy. RICE stands for rest, ice, compression, and elevate.  Avoid using the affected area while you have symptoms. This information is not intended to replace advice given to you by your health care  provider. Make sure you discuss any questions you have with your health care provider. Document Revised: 08/24/2017 Document Reviewed: 07/07/2017 Elsevier Patient Education  2020 [employer]  Toe Fracture Rehab Ask your health care provider which exercises are safe for you. Do exercises exactly as told by your health care provider and adjust them as directed. It is normal to feel mild stretching, pulling, tightness, or discomfort as you do these exercises. Stop right away if you feel sudden pain or your pain gets worse. Do not begin these exercises until told by your health care provider. Stretching and range-of-motion exercises These exercises warm up your muscles and joints and improve the movement and flexibility of your foot. These exercises also help to relieve pain. Plantar flexion with toe extension, seated 1. Sit on a chair that lets your feet rest flat on the floor. 2. Keeping your toes firmly on the ground, lift your left / right heel off the ground (plantar flexion). You should feel a stretch under your toes. 3. Hold this position for __________ seconds. 4. Bring your heel back down to the floor. Repeat __________ times. Complete this exercise __________ times a day. Ankle alphabet  1. Sit with your left / right leg supported at the lower leg. ? Do not rest your foot on anything. ? Make sure your foot has room to move freely. 2. Think of your left / right foot as a paintbrush, and move your foot to trace each letter of the alphabet in the air. Keep your hip and knee still while you trace. Make the letters as large as you can without feeling discomfort. 3. Trace every letter of the alphabet. Repeat __________ times. Complete this exercise __________ times a day. Toe flexion, assisted  1. Sit with your left / right leg crossed over your opposite knee. 2. Hold your left / right ankle with your hand from the same side of your body. With your other hand, hold your toes. 3. Use the  hand that is holding your toes to gently curl your toes down toward the bottom of your foot (assisted flexion). You should feel a stretch on the top of your toes and foot. 4. Hold this position for __________ seconds. 5. Release your toes and return to the starting position. Repeat __________ times. Complete this exercise __________ times a day. Strengthening exercise This exercise builds strength and endurance in your foot. Endurance is the ability to use your muscles for a long time, even after they get tired. Towel curls  1. Sit in a chair on a non-carpeted surface, and put your feet on the floor. 2. Place a towel in front of your feet. If told by your health care provider, add a __________ weight to the end of the towel. 3. Keeping your heel on the floor, put your  left / right foot on the towel. 4. Pull the towel toward you by grabbing the towel with your toes and curling them under. Keep your heel on the floor while you do this. 5. Let your toes relax. 6. Grab the towel again. Keep pulling the towel until it is completely underneath your foot. Repeat __________ times. Complete this exercise __________ times a day. Balance exercise This exercise will help improve the control of your toe and foot when you are standing or walking. Single leg stand 1. Without shoes, stand near a railing or in a doorway. You may hold on to the railing or door frame as needed for balance. 2. Stand on your left / right foot. Keep your big toe down on the floor and try to keep your arch lifted. 3. Hold this position for __________ seconds. 4. If this exercise is too easy, you can try doing it with your eyes closed or while standing on a pillow. Repeat __________ times. Complete this exercise __________ times a day. This information is not intended to replace advice given to you by your health care provider. Make sure you discuss any questions you have with your health care provider. Document Revised: 06/08/2018  Document Reviewed: 01/05/2018 Elsevier Patient Education  2020 [employer].

## 2019-09-25 ENCOUNTER — Other Ambulatory Visit: Payer: Self-pay | Admitting: Family Medicine

## 2019-09-25 DIAGNOSIS — L02818 Cutaneous abscess of other sites: Secondary | ICD-10-CM

## 2019-09-25 NOTE — Telephone Encounter (Signed)
Patient requesting refill on her clindamycin. She would like to know if she can get a bigger tube/bottle of it this time. Pharmacy is [facility] on [address]. Please advise.

## 2019-09-26 MED ORDER — CLINDAMYCIN PHOSPHATE 1 % EX SOLN: Freq: Two times a day (BID) | CUTANEOUS | 2 refills | Status: DC

## 2019-09-26 NOTE — Telephone Encounter (Signed)
Larger bottle sent to pharmacy--- 60 mL, appears to be largest bottle available. Nursing- please let patient know.  [name], MD  Family Medicine Teaching Service

## 2019-09-27 NOTE — Telephone Encounter (Signed)
Patient informed.  .Bron Snellings R Brena Windsor, CMA  

## 2019-10-24 ENCOUNTER — Other Ambulatory Visit: Payer: Self-pay

## 2019-10-24 ENCOUNTER — Encounter: Payer: Self-pay | Admitting: Cardiovascular Disease

## 2019-10-24 ENCOUNTER — Ambulatory Visit: Payer: Medicaid Other | Admitting: Cardiovascular Disease

## 2019-10-24 VITALS — BP 192/98 | HR 60 | Ht 66.0 in | Wt 282.4 lb

## 2019-10-24 DIAGNOSIS — I2510 Atherosclerotic heart disease of native coronary artery without angina pectoris: Secondary | ICD-10-CM | POA: Diagnosis not present

## 2019-10-24 DIAGNOSIS — I10 Essential (primary) hypertension: Secondary | ICD-10-CM

## 2019-10-24 NOTE — Progress Notes (Signed)
Cardiology Office Note:    Date:  10/24/2019   ID:  Tammy [name], DOB 11-10-1955, MRN [id number]  PCP:  Westley Chandler, MD  Cardiologist:  Tobias Alexander, MD, Kristeen Miss, MD   Referring MD: Westley Chandler, MD   Chief Complaint  Patient presents with   Coronary Artery Disease    History of Present Illness:    Tammy [name] is a 64 y.o. female with a hx of CAD, HTN, , PVD   She was admitted in May, 2016 with chest pain and positive cardiac enzymes.  She has a history of hypertension and cigarette smoking.  Troponin levels were elevated.  Peak troponin level 2.75.  Left heart cath  revealed numerous mild to moderate irregularities.  Prox RCA lesion, 30% stenosed.  Mid RCA lesion, 60% stenosed.  RPDA lesion, 40% stenosed.  Post Atrio lesion, 20% stenosed.  Dist RCA lesion, 20% stenosed.  Prox Cx to Mid Cx lesion, 20% stenosed.  1st Mrg lesion, 20% stenosed.  2nd Mrg lesion, 40% stenosed.  Ost 3rd Mrg to 3rd Mrg lesion, 50% stenosed.  Ost LAD to Prox LAD lesion, 30% stenosed.  Mid LAD lesion, 30% stenosed.  Dist LAD lesion, 60% stenosed.  Is not able to walk due to leg pain when she walks .  She continues smoke .    No further chest pain   Aug.  30, 2019  Seen for follow up . Has some back spasms Walks some Still smoking  - knows she should quit  BP is a bit elevated today . Avoids salty foods for the most part  Does not eat out muc  October 24, 2019: Tammy [name] is seen today for follow-up of her coronary artery disease, hypertension, obesity. BP is still elevated.  Ate pop corn last night  Still smokes No exercise  Wt is 282 lbs.   Past Medical History:  Diagnosis Date   Abnormal mammogram 10.25.11   area of density with several adjacent amorphous calcifications located laterally w/in the right breast at approx 9-10 o'clock position likely represents area of evolving far necrosis; f/u mamo and U/S in 6 months   CAD (coronary artery  disease)    a. 07/2014 NSTEMI/Cath: LM nl, LAD 30ost/m, 60d, LCX 20p, OM1 20, OM2 40, OM3 50, RCA 30p, 21m, 20d, RPDA 40, EF 65%-->Med Rx;  b. 07/2014 Echo: EF 55-60%, no rwma, mildly dil LA.   Essential hypertension    GERD (gastroesophageal reflux disease)    Hyperlipidemia    Morbid obesity (HCC)    PAD (peripheral artery disease) (HCC)    a. 07/2014 ABI's: R 0.88, L 0.32.   Ulcer     Past Surgical History:  Procedure Laterality Date   BREAST BIOPSY Left 09/01/2018   CARDIAC CATHETERIZATION N/A 07/16/2014   Procedure: Left Heart Cath and Coronary Angiography;  Surgeon: Kathleene Hazel, MD;  Location: [facility] INVASIVE CV LAB;  Service: Cardiovascular;  Laterality: N/A;   CESAREAN SECTION     PERIPHERAL VASCULAR CATHETERIZATION N/A 02/12/2015   Procedure: Abdominal Aortogram;  Surgeon: Nada Libman, MD;  Location: [facility] INVASIVE CV LAB;  Service: Cardiovascular;  Laterality: N/A;   TRIGGER FINGER RELEASE Left    middle finger   UPPER GASTROINTESTINAL ENDOSCOPY  1991    Current Medications: Current Meds  Medication Sig   amLODipine (NORVASC) 10 MG tablet Take 1 tablet (10 mg total) by mouth daily.   aspirin 81 MG EC tablet Take 1 tablet (81 mg total) by  mouth daily.   atorvastatin (LIPITOR) 80 MG tablet TAKE 80 mg TABLET BY MOUTH EVERY DAY   chlorhexidine (HIBICLENS) 4 % external liquid Apply topically daily as needed.   clindamycin (CLEOCIN-T) 1 % external solution Apply topically 2 (two) times daily. Apply to lesion on mons pubis twice daily for 1 week   clopidogrel (PLAVIX) 75 MG tablet TAKE 1 TABLET BY MOUTH EVERY DAY   cyclobenzaprine (FLEXERIL) 5 MG tablet Take 1 tablet (5 mg total) by mouth 3 (three) times daily as needed for muscle spasms.   diclofenac Sodium (VOLTAREN) 1 % GEL Apply 4 g topically 4 (four) times daily.   indapamide (LOZOL) 2.5 MG tablet Take 1 tablet (2.5 mg total) by mouth every other day.   metoprolol tartrate (LOPRESSOR) 100 MG  tablet Take 1 tablet (100 mg total) by mouth 2 (two) times daily.   mupirocin ointment (BACTROBAN) 2 % Apply to affected area once daily at night   nitroGLYCERIN (NITROSTAT) 0.4 MG SL tablet Place 1 tablet (0.4 mg total) under the tongue every 5 (five) minutes as needed for chest pain.   Current Facility-Administered Medications for the 10/24/19 encounter (Office Visit) with Tyrika Newman, Deloris Ping, MD  Medication   acetaminophen (TYLENOL) tablet 975 mg     Allergies:   Ace inhibitors, Hydrochlorothiazide, and Ezetimibe   Social History   Socioeconomic History   Marital status: Single    Spouse name: Not on file   Number of children: Not on file   Years of education: Not on file   Highest education level: Not on file  Occupational History   Not on file  Tobacco Use   Smoking status: Current Every Day Smoker    Packs/day: 0.25    Years: 30.00    Pack years: 7.50    Types: Cigarettes   Smokeless tobacco: Never Used   Tobacco comment: 8-10 cigarettes per day  Substance and Sexual Activity   Alcohol use: No    Alcohol/week: 0.0 standard drinks   Drug use: No   Sexual activity: Not Currently    Birth control/protection: Post-menopausal, Abstinence  Other Topics Concern   Not on file  Social History Narrative   Not on file   Social Determinants of Health   Financial Resource Strain:    Difficulty of Paying Living Expenses: Not on file  Food Insecurity:    Worried About Programme researcher, broadcasting/film/video in the Last Year: Not on file   [employer] of Food in the Last Year: Not on file  Transportation Needs:    Lack of Transportation (Medical): Not on file   Lack of Transportation (Non-Medical): Not on file  Physical Activity:    Days of Exercise per Week: Not on file   Minutes of Exercise per Session: Not on file  Stress:    Feeling of Stress : Not on file  Social Connections:    Frequency of Communication with Friends and Family: Not on file   Frequency of Social  Gatherings with Friends and Family: Not on file   Attends Religious Services: Not on file   Active Member of Clubs or Organizations: Not on file   Attends Banker Meetings: Not on file   Marital Status: Not on file     Family History: The patient's family history includes Breast cancer in her maternal aunt; Cancer in her maternal aunt and mother; Diabetes in her father and mother; Hypertension in her brother, father, and sister; Ovarian cancer in her maternal aunt. ROS:  Please see the history of present illness.     All other systems reviewed and are negative.  EKGs/Labs/Other Studies Reviewed:    The following studies were reviewed today:     Recent Labs: 02/14/2019: ALT 10 08/25/2019: BUN 20; Creatinine, Ser 1.07; Potassium 4.3; Sodium 137  Recent Lipid Panel    Component Value Date/Time   CHOL 143 02/14/2019 0931   TRIG 59 02/14/2019 0931   HDL 51 02/14/2019 0931   CHOLHDL 2.8 02/14/2019 0931   CHOLHDL 4.3 07/15/2014 0600   VLDL 23 07/15/2014 0600   LDLCALC 80 02/14/2019 0931    Physical Exam:    Physical Exam: Blood pressure (!) 192/98, pulse 60, height 5\' 6"  (1.676 m), weight 282 lb 6.4 oz (128.1 kg), SpO2 96 %.  GEN:  Morbidly obese female,  NAD  HEENT: Normal NECK: No JVD; No carotid bruits LYMPHATICS: No lymphadenopathy CARDIAC: RRR , no murmurs, rubs, gallops RESPIRATORY:  Clear to auscultation without rales, wheezing or rhonchi  ABDOMEN: Soft, non-tender, non-distended MUSCULOSKELETAL:  No edema; No deformity  SKIN: Warm and dry NEUROLOGIC:  Alert and oriented x 3    EKG:    October 24, 2019: Normal sinus rhythm at 60.  No ST or T wave changes.  ASSESSMENT:    1. Accelerated hypertension    PLAN:    In order of problems listed above:  1. Coronary artery disease:     Prox RCA lesion, 30% stenosed.  Mid RCA lesion, 60% stenosed.  RPDA lesion, 40% stenosed.  Post Atrio lesion, 20% stenosed.  Dist RCA lesion, 20%  stenosed.  Prox Cx to Mid Cx lesion, 20% stenosed.  1st Mrg lesion, 20% stenosed.  2nd Mrg lesion, 40% stenosed.  Ost 3rd Mrg to 3rd Mrg lesion, 50% stenosed.  Ost LAD to Prox LAD lesion, 30% stenosed.  Mid LAD lesion, 30% stenosed.  Dist LAD lesion, 60% stenosed.   2.  HTN:   BP remains elevated.   She has tried numerous meds.   Still smoking,  Still eating salty foods.  I'll refer her to Dr. October 26, 2019 / HTN clinic  for further advice.    2.  Hyperlipidemia:  Managed by her primary MD   3.  Morbid obesity:     Advised weight loss.   She continues to gain weight .  Medication Adjustments/Labs and Tests Ordered: Current medicines are reviewed at length with the patient today.  Concerns regarding medicines are outlined above.  Orders Placed This Encounter  Procedures   Ambulatory referral to Cardiology   No orders of the defined types were placed in this encounter.   Signed, Duke Salvia, MD  10/24/2019 5:44 PM    [facility]

## 2019-10-24 NOTE — Patient Instructions (Addendum)
Medication Instructions:  Your physician recommends that you continue on your current medications as directed. Please refer to the Current Medication list given to you today.  *If you need a refill on your cardiac medications before your next appointment, please call your pharmacy*   Lab Work: None Ordered If you have labs (blood work) drawn today and your tests are completely normal, you will receive your results only by: Marland Kitchen MyChart Message (if you have MyChart) OR . A paper copy in the mail If you have any lab test that is abnormal or we need to change your treatment, we will call you to review the results.   Testing/Procedures: None Ordered   Follow-Up: You have been referred to Hypertension Clinic with Dr. Duke Salvia at our [facility] office on [facility]   At [facility], you and your health needs are our priority.  As part of our continuing mission to provide you with exceptional heart care, we have created designated Provider Care Teams.  These Care Teams include your primary Cardiologist (physician) and Advanced Practice Providers (APPs -  Physician Assistants and Nurse Practitioners) who all work together to provide you with the care you need, when you need it.  We recommend signing up for the patient portal called "MyChart".  Sign up information is provided on this After Visit Summary.  MyChart is used to connect with patients for Virtual Visits (Telemedicine).  Patients are able to view lab/test results, encounter notes, upcoming appointments, etc.  Non-urgent messages can be sent to your provider as well.   To learn more about what you can do with MyChart, go to [email or web address].    Your next appointment:   1 year(s)  The format for your next appointment:   In Person  Provider:   Tereso Newcomer, PA-C or Chelsea Aus, PA-C

## 2019-10-25 ENCOUNTER — Other Ambulatory Visit: Payer: Self-pay | Admitting: Family Medicine

## 2019-10-25 ENCOUNTER — Other Ambulatory Visit: Payer: Self-pay

## 2019-10-25 ENCOUNTER — Ambulatory Visit (INDEPENDENT_AMBULATORY_CARE_PROVIDER_SITE_OTHER): Payer: Medicaid Other

## 2019-10-25 VITALS — BP 200/110 | HR 58 | Wt 277.2 lb

## 2019-10-25 DIAGNOSIS — I10 Essential (primary) hypertension: Secondary | ICD-10-CM

## 2019-10-25 MED ORDER — LOSARTAN POTASSIUM 50 MG PO TABS
50.0000 mg | ORAL_TABLET | Freq: Every day | ORAL | 3 refills | Status: DC
Start: 2019-10-25 — End: 2019-12-29

## 2019-10-25 MED ORDER — KETOROLAC TROMETHAMINE 60 MG/2ML IM SOLN
60.0000 mg | Freq: Once | INTRAMUSCULAR | Status: AC
  Administered 2019-10-25: 60 mg via INTRAMUSCULAR

## 2019-10-25 NOTE — Progress Notes (Signed)
PATIENT INSTRUCTIONS Pick up the losartan and start taking it tonight Take all of your othe medicines as before Take a flexeril when yo get home and lie down or take it easy Cathie Hoops CAN continue to take tylenol tonight as you have been  IF you get any worsening of headache or a new symptom such as shortness of breath, chest pain, dizziness or weakness, call EMS and go to the emergency department.  In the morning, call our office and talk with Dahlia Client and tell her how ou are feeling. I wil be here and we can discuss.  Great to meet you!  NOTE:  Patient was seen at cardiology yesterday and elevated BP there. They set her up for HTN clinic but made no med changes.  She comes here today for BP check with nurse visit.  She has had headache for 7 ish days. Posterior and left posterior neck is where it starts. It has improved with tylenol and it was almost gone this AM but thru day has gotten worse. Not a lot of hx of headaches. No blurry vision.Not having any other symptoms  Specifically: no chest pain,no  focal weakness or numbness, no extremity edema.She is having slight photophobia.  Exam is normal except for elevated BP and some anxiety over her BP numbers. She is concerned. CV RRR. No edema in extremity. Lungs unlabored. Normal affect and answers and asks questions appropriately.   A. Uncontrolled HTN. Has appt at HTN clinic. I thibnk her elevated BP today can be improved by putting her back on losartan, at least temporarily. Toradol injection x one (GFR 55) and Flexeril dose to see if we can break this pain cycle.  F/u in AM.  Patient is comfortable with this plan and seems reassured. We did discuss RED FLAGS for HTN and that this is quite elevated putting her in range for stroke risk. Given the extended eait times currently in ED (20+ hours), I think itr would be better to follow this plan  That to send her to ED where there would likely be a long delay for treatment. However, if she has new or  worsening sx, she knows to call EMS.

## 2019-10-25 NOTE — Progress Notes (Signed)
Patient reports to nurse clinic for BP check. Patient reports headache, 4/10 currently. Denies blurry vision, chest pain, arm pain or shortness of breath. Patient's BP readings are as follows: 229/101(automatic), 200/110 (Manual). Patient had recent cardiologist visit in which BP was also elevated, prompting patient to schedule nurse visit.   Preceptor Jennette Kettle) made aware of situation and evaluated patient at bedside. *See progress note.   Received verbal order for 60 mg of Toradol IM. Administered in RUOQ, site unremarkable, patient tolerated well.   Patient to follow up with phone call to clinic tomorrow morning. Strict ED precautions had been discussed with Dr. Jennette Kettle.   Veronda Prude, RN

## 2019-10-26 ENCOUNTER — Telehealth: Payer: Self-pay

## 2019-10-26 MED ORDER — INDAPAMIDE 2.5 MG PO TABS: 2.5000 mg | ORAL_TABLET | Freq: Every day | ORAL | 3 refills | Status: DC

## 2019-10-26 MED ORDER — AMLODIPINE BESYLATE 10 MG PO TABS: 10.0000 mg | ORAL_TABLET | Freq: Every day | ORAL | 1 refills | Status: DC

## 2019-10-26 NOTE — Telephone Encounter (Signed)
Patient calls nurse line following up from yesterdays nurse visit for hypertension. Patient reports she is feeling much better. Patient reports she took Losartan last night and Metoprolol this morning. Patient denies any headaches, chest pain, or SOB. ED precautions given.   Patient seemed confused on what blood pressure medications she is to be on. Patient states she only takes Losartan and Metoprolol, however doesn't know if she is still supposed to be on Amlodipine. If so, please send to her pharmacy. Please advise.

## 2019-10-26 NOTE — Telephone Encounter (Signed)
I called patient to advise on blood pressure medications. Patient began to scream (not necessarily bad, just very loud,) "I don't understand why I am taking all these medications and when am I supposed to be taking all these medications." Please call her to go over her blood pressure medications and when she should be taking them. Thank you.

## 2019-10-26 NOTE — Telephone Encounter (Signed)
Called patient.  She is able to describe her medication regimen. It appears she was missing her amlodipine and possibly indapamide. Both Rx to pharmacy. She at pharmacy awaiting to pick them up.   Will follow up at appointment on 9/10.  [name], MD  Family Medicine Teaching Service

## 2019-10-26 NOTE — Telephone Encounter (Signed)
Yes---- Rx to pharmacy.   Please let her know she SHOULD be taking amlodipine 10 mg and indapamide 2.5 mg.   Both have been sent to pharmacy.  Thank you,  [name], MD  [facility] Medicine Teaching Service

## 2019-10-30 ENCOUNTER — Other Ambulatory Visit: Payer: Self-pay

## 2019-10-30 ENCOUNTER — Other Ambulatory Visit: Payer: Self-pay | Admitting: Student in an Organized Health Care Education/Training Program

## 2019-10-30 DIAGNOSIS — I739 Peripheral vascular disease, unspecified: Secondary | ICD-10-CM

## 2019-10-30 MED ORDER — CYCLOBENZAPRINE HCL 5 MG PO TABS: 5.0000 mg | ORAL_TABLET | Freq: Three times a day (TID) | ORAL | 2 refills | Status: DC | PRN

## 2019-10-30 NOTE — Addendum Note (Signed)
Addended by: Melanee Spry on: 10/30/2019 02:39 PM   Modules accepted: Orders

## 2019-11-10 ENCOUNTER — Ambulatory Visit (INDEPENDENT_AMBULATORY_CARE_PROVIDER_SITE_OTHER): Payer: Medicaid Other | Admitting: Family Medicine

## 2019-11-10 ENCOUNTER — Encounter: Payer: Self-pay | Admitting: Family Medicine

## 2019-11-10 ENCOUNTER — Other Ambulatory Visit: Payer: Self-pay

## 2019-11-10 VITALS — BP 185/90 | HR 55 | Ht 66.0 in | Wt 274.0 lb

## 2019-11-10 DIAGNOSIS — Z23 Encounter for immunization: Secondary | ICD-10-CM | POA: Diagnosis not present

## 2019-11-10 DIAGNOSIS — I10 Essential (primary) hypertension: Secondary | ICD-10-CM | POA: Diagnosis not present

## 2019-11-10 MED ORDER — BLOOD PRESSURE CUFF MISC: 0 refills | Status: DC

## 2019-11-10 NOTE — Patient Instructions (Signed)
It was wonderful to see you today.  Please bring ALL of your medications with you to every visit.   Today we talked about:  - Your blood pressure pills  - START the amlodipine at night  I will send a refill for your Plavix  Follow up in 4- 6 weeks    Thank you for choosing [facility].   Please call [phone] with any questions about today's appointment.  Please be sure to schedule follow up at the front  desk before you leave today.   [name], MD  Family Medicine

## 2019-11-10 NOTE — Progress Notes (Signed)
    SUBJECTIVE:   CHIEF COMPLAINT / HPI:   Tammy [name] is a 64 year old woman with history significant for difficult to control hypertension, obesity and tobacco abuse presenting today for blood pressure check and discuss her medications.  She has all of her medications with her which are neatly organized.  She can describe when and how she takes all of them.  She is not taking her amlodipine.  Her blood pressure today is elevated.  She does not have a home blood pressure cuff.  She is interested in getting 1.  She denies headaches, neck pain, chest pain, difficulty breathing.  The patient smokes about a 1/4 pack of cigarettes a day.  She is interested in quitting but finds she gains weight when she tries to quit.  At this time she would like to focus on reducing her weight.  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   PERTINENT  PMH / PSH/Family/Social History : Reviewed and updated as appropriate  OBJECTIVE:   BP (!) 185/90   Pulse (!) 55   Ht 5\' 6"  (1.676 m)   Wt 274 lb (124.3 kg)   SpO2 100%   BMI 44.22 kg/m   HEENT: Sclera anicteric. Dentition is moderate. Appears well hydrated. Cardiac: Regular rate and rhythm. Normal S1/S2. No murmurs, rubs, or gallops appreciated. Lungs: Trace wheezing throughout bilateral lung fields on inspiration Abdomen: Normoactive bowel sounds. No tenderness to deep or light palpation. No rebound or guarding.  Extremities: Warm, well perfused without edema.  Skin: Warm, dry Psych: Pleasant and appropriate    ASSESSMENT/PLAN:   Essential hypertension, not at goal  Markedly elevated today.  Similar repeat value.  She is not taking her amlodipine.  We restarted this medication.  Medications reviewed today we reviewed how and why she takes each of her medications.   Tobacco use Discussed how weight and smoking may contribute to both  of these.  She is determined to both lose weight and quit smoking.  She is frustrated by the fact that every time she tries to stop smoking she then gains weight.  Discussed at length.  Follow-up in 1 month to continue to engage in motivational interviewing and strategies to quit smoking.   Healthcare maintenance the patient received both her Covid vaccine influenza vaccine.  Influenza was administered today.  , MD  Family Medicine Teaching Service  [facility]

## 2019-11-10 NOTE — Assessment & Plan Note (Signed)
Markedly elevated today.  Similar repeat value.  She is not taking her amlodipine.  We restarted this medication.  Medications reviewed today we reviewed how and why she takes each of her medications.  Discussed how weight and smoking may contribute to both of these.  She is determined to both lose weight and quit smoking.  She is frustrated by the fact that every time she tries to stop smoking she then gains weight.  Discussed at length.  Follow-up in 1 month to continue to engage in motivational interviewing and strategies to quit smoking.

## 2019-11-15 ENCOUNTER — Other Ambulatory Visit: Payer: Self-pay

## 2019-11-15 DIAGNOSIS — L02818 Cutaneous abscess of other sites: Secondary | ICD-10-CM

## 2019-11-15 MED ORDER — CLINDAMYCIN PHOSPHATE 1 % EX SOLN: Freq: Two times a day (BID) | CUTANEOUS | 2 refills | Status: DC

## 2019-11-16 ENCOUNTER — Other Ambulatory Visit: Payer: Self-pay

## 2019-11-16 ENCOUNTER — Encounter: Payer: Self-pay | Admitting: Cardiovascular Disease

## 2019-11-16 ENCOUNTER — Ambulatory Visit (INDEPENDENT_AMBULATORY_CARE_PROVIDER_SITE_OTHER): Payer: Medicaid Other | Admitting: Cardiovascular Disease

## 2019-11-16 VITALS — BP 116/80 | HR 52 | Ht 66.0 in | Wt 276.0 lb

## 2019-11-16 DIAGNOSIS — I739 Peripheral vascular disease, unspecified: Secondary | ICD-10-CM

## 2019-11-16 DIAGNOSIS — I2510 Atherosclerotic heart disease of native coronary artery without angina pectoris: Secondary | ICD-10-CM | POA: Diagnosis not present

## 2019-11-16 DIAGNOSIS — E782 Mixed hyperlipidemia: Secondary | ICD-10-CM

## 2019-11-16 DIAGNOSIS — I10 Essential (primary) hypertension: Secondary | ICD-10-CM | POA: Diagnosis not present

## 2019-11-16 DIAGNOSIS — F17200 Nicotine dependence, unspecified, uncomplicated: Secondary | ICD-10-CM

## 2019-11-16 NOTE — Progress Notes (Signed)
Hypertension Clinic Initial Assessment:    Date:  11/16/2019   ID:  Tammy [name], DOB [birth year], MRN [id number]  PCP:  Westley Chandler, MD  Cardiologist:  Kristeen Miss, MD  Nephrologist:  Referring MD: Vesta Mixer, MD   CC: Hypertension  History of Present Illness:    Tammy [name] is a 64 y.o. female with a hx of hypertension, hyperlipidemia, PAD, CAD s/p NSTEMI (medically managed), obesity and tobacco abuse here to establish care in the hypertension clinic. She last saw Dr. Elease Hashimoto on 10/2019 and her BP was elevated to 192/98.  She saw Dr. Manson Passey on 9/10 and her blood pressure was still elevated.  She was not taking amlodipine at that time so it was restarted.  She has been feeling well physically.  She does not check her blood pressure at home because she does not have a machine.  She feels that her blood pressure is heavily related to stress.  She has a family member who is causing a lot of stress for her.  She does not get any exercise.  She is afraid to go outside due to COVID-19.  She mostly cooks at home.  She does add salt to her food when cooking and struggles with figuring out how to make her food taste good without adding salt.  She is frustrated that she is gaining weight despite the fact that her appetite has been poor.  She does not eat very much and when she does a lot of time she has fruit for snacks or even meals.  She drinks tea occasionally but has no other caffeine.  She does not drink alcohol.  She smokes about 6 to 7 cigarettes daily.  When she is stressed she smokes more.  She cooks with salt and doesn't add it after the food is prepared.  She drinks a lot of fruit.   She drinks tea occasionally but has no other caffeine.  She does not drink alcohol.  She smokes about 6-7 cigarettes daily.  She has no LE edema, orthopnea, or PND.  She is unsure whether she snores.  She is sometimes rested in the mornings.    Previous antihypertensives: ACE-I: Rash HCTZ:  rash   Past Medical History:  Diagnosis Date  . Abnormal mammogram 10.25.11   area of density with several adjacent amorphous calcifications located laterally w/in the right breast at approx 9-10 o'clock position likely represents area of evolving far necrosis; f/u mamo and U/S in 6 months  . CAD (coronary artery disease)    a. 07/2014 NSTEMI/Cath: LM nl, LAD 30ost/m, 60d, LCX 20p, OM1 20, OM2 40, OM3 50, RCA 30p, 24m, 20d, RPDA 40, EF 65%-->Med Rx;  b. 07/2014 Echo: EF 55-60%, no rwma, mildly dil LA.  . Essential hypertension   . GERD (gastroesophageal reflux disease)   . Hyperlipidemia   . Morbid obesity (HCC)   . PAD (peripheral artery disease) (HCC)    a. 07/2014 ABI's: R 0.88, L 0.32.  Marland Kitchen Ulcer     Past Surgical History:  Procedure Laterality Date  . BREAST BIOPSY Left 09/01/2018  . CARDIAC CATHETERIZATION N/A 07/16/2014   Procedure: Left Heart Cath and Coronary Angiography;  Surgeon: Kathleene Hazel, MD;  Location: [facility] INVASIVE CV LAB;  Service: Cardiovascular;  Laterality: N/A;  . CESAREAN SECTION    . PERIPHERAL VASCULAR CATHETERIZATION N/A 02/12/2015   Procedure: Abdominal Aortogram;  Surgeon: Nada Libman, MD;  Location: [facility] INVASIVE CV LAB;  Service: Cardiovascular;  Laterality: N/A;  . TRIGGER FINGER RELEASE Left    middle finger  . UPPER GASTROINTESTINAL ENDOSCOPY  1991    Current Medications: Current Meds  Medication Sig  . amLODipine (NORVASC) 10 MG tablet Take 1 tablet (10 mg total) by mouth daily.  Marland Kitchen aspirin 81 MG EC tablet Take 1 tablet (81 mg total) by mouth daily.  Marland Kitchen atorvastatin (LIPITOR) 80 MG tablet TAKE 80 mg TABLET BY MOUTH EVERY DAY  . Blood Pressure Monitoring (BLOOD PRESSURE CUFF) MISC Use to check blood pressure  . chlorhexidine (HIBICLENS) 4 % external liquid Apply topically daily as needed.  . clindamycin (CLEOCIN-T) 1 % external solution Apply topically 2 (two) times daily. Apply to lesion on mons pubis twice daily for 1 week  . clopidogrel  (PLAVIX) 75 MG tablet TAKE 1 TABLET BY MOUTH EVERY DAY  . cyclobenzaprine (FLEXERIL) 5 MG tablet Take 1 tablet (5 mg total) by mouth 3 (three) times daily as needed for muscle spasms.  . diclofenac Sodium (VOLTAREN) 1 % GEL Apply 4 g topically 4 (four) times daily.  . indapamide (LOZOL) 2.5 MG tablet Take 1 tablet (2.5 mg total) by mouth daily.  Marland Kitchen losartan (COZAAR) 50 MG tablet Take 1 tablet (50 mg total) by mouth at bedtime.  . metoprolol tartrate (LOPRESSOR) 100 MG tablet Take 1 tablet (100 mg total) by mouth 2 (two) times daily.  . mupirocin ointment (BACTROBAN) 2 % Apply to affected area once daily at night  . nitroGLYCERIN (NITROSTAT) 0.4 MG SL tablet Place 1 tablet (0.4 mg total) under the tongue every 5 (five) minutes as needed for chest pain.   Current Facility-Administered Medications for the 11/16/19 encounter (Office Visit) with Chilton Si, MD  Medication  . acetaminophen (TYLENOL) tablet 975 mg     Allergies:   Ace inhibitors, Hydrochlorothiazide, and Ezetimibe   Social History   Socioeconomic History  . Marital status: Single    Spouse name: Not on file  . Number of children: Not on file  . Years of education: Not on file  . Highest education level: Not on file  Occupational History  . Not on file  Tobacco Use  . Smoking status: Current Every Day Smoker    Packs/day: 0.25    Years: 30.00    Pack years: 7.50    Types: Cigarettes  . Smokeless tobacco: Never Used  . Tobacco comment: 8-10 cigarettes per day  Substance and Sexual Activity  . Alcohol use: No    Alcohol/week: 0.0 standard drinks  . Drug use: No  . Sexual activity: Not Currently    Birth control/protection: Post-menopausal, Abstinence  Other Topics Concern  . Not on file  Social History Narrative  . Not on file   Social Determinants of Health   Financial Resource Strain:   . Difficulty of Paying Living Expenses: Not on file  Food Insecurity:   . Worried About Programme researcher, broadcasting/film/video in the  Last Year: Not on file  . Ran Out of Food in the Last Year: Not on file  Transportation Needs:   . Lack of Transportation (Medical): Not on file  . Lack of Transportation (Non-Medical): Not on file  Physical Activity:   . Days of Exercise per Week: Not on file  . Minutes of Exercise per Session: Not on file  Stress:   . Feeling of Stress : Not on file  Social Connections:   . Frequency of Communication with Friends and Family: Not on file  . Frequency of Social  Gatherings with Friends and Family: Not on file  . Attends Religious Services: Not on file  . Active Member of Clubs or Organizations: Not on file  . Attends BankerClub or Organization Meetings: Not on file  . Marital Status: Not on file     Family History: The patient's family history includes Breast cancer in her maternal aunt; Cancer in her maternal aunt and mother; Diabetes in her father and mother; Hypertension in her brother, father, and sister; Ovarian cancer in her maternal aunt.  ROS:   Please see the history of present illness.     All other systems reviewed and are negative.  EKGs/Labs/Other Studies Reviewed:    EKG:  EKG is not ordered today.  The ekg ordered 10/24/19 demonstrates sinus rhythm.  Rate 60 bpm  Recent Labs: 02/14/2019: ALT 10 08/25/2019: BUN 20; Creatinine, Ser 1.07; Potassium 4.3; Sodium 137   Recent Lipid Panel    Component Value Date/Time   CHOL 143 02/14/2019 0931   TRIG 59 02/14/2019 0931   HDL 51 02/14/2019 0931   CHOLHDL 2.8 02/14/2019 0931   CHOLHDL 4.3 07/15/2014 0600   VLDL 23 07/15/2014 0600   LDLCALC 80 02/14/2019 0931    Physical Exam:    VS:  BP 116/80   Pulse (!) 52   Ht 5\' 6"  (1.676 m)   Wt 276 lb (125.2 kg)   SpO2 99%   BMI 44.55 kg/m  , BMI Body mass index is 44.55 kg/m. GENERAL:  Well appearing HEENT: Pupils equal round and reactive, fundi not visualized, oral mucosa unremarkable NECK:  No jugular venous distention, waveform within normal limits, carotid upstroke  brisk and symmetric, no bruits, no thyromegaly LYMPHATICS:  No cervical adenopathy LUNGS:  Clear to auscultation bilaterally HEART:  RRR.  PMI not displaced or sustained,S1 and S2 within normal limits, no S3, no S4, no clicks, no rubs, no murmurs ABD:  Flat, positive bowel sounds normal in frequency in pitch, no bruits, no rebound, no guarding, no midline pulsatile mass, no hepatomegaly, no splenomegaly EXT:  2 plus pulses throughout, no edema, no cyanosis no clubbing SKIN:  No rashes no nodules NEURO:  Cranial nerves II through XII grossly intact, motor grossly intact throughout PSYCH:  Cognitively intact, oriented to person place and time  ASSESSMENT:    1. Essential hypertension   2. Coronary artery disease involving native coronary artery of native heart without angina pectoris   3. HYPERTENSION, BENIGN SYSTEMIC   4. PAD (peripheral artery disease) (HCC)   5. Mixed hyperlipidemia   6. TOBACCO DEPENDENCE     PLAN:    # Essential hypertension:  Ms. Doristine CounterBurnett has struggled with her blood pressure.  However today it was well-controlled both initially and on repeat.  She reports that she was taking her medication at her prior clinic appointments.  She notes that she has a lot of trouble with stress reduction.  We will have her see our care guide to discuss stress management techniques.  We also noted that she is not exercising at all.  She is going to start by trying to walk around her building twice daily.  She will build this up until she is exercising for 150 minutes weekly.  [facility]  However she is not comfortable doing indoor activities due to COVID-19.  She was provided with a blood pressure cuff today.  She has been asked to track it twice daily and bring this to follow-up.  For  now we will continue amlodipine, indapamide, losartan, and metoprolol.  If her blood pressure remains elevated in the future would consider switching metoprolol to  carvedilol and losartan to an alternative ARB such as irbesartan, valsartan, or olmesartan.  We discussed limiting her sodium intake.  She is reluctant but is willing to try.  We also discussed smoking cessation.  She needs to work on stress reduction before she would be able to successfully quit smoking.   Secondary Causes of Hypertension  Medications/Herbal: OCP, steroids, stimulants, antidepressants, weight loss medication, immune suppressants, NSAIDs, sympathomimetics, alcohol, caffeine, licorice, ginseng, St. John's wort, chemo: (none identified_ Sleep Apnea: consider in future.  No clear symptoms Renal artery stenosis: No renal artery stenosis on abdominal aortogram 01/2015.  Consider repeating if blood pressure is difficult to manage in the future. Hyperaldosteronism: BP controlled today Hyper/hypothyroidism: Check TSH Pheochromocytoma: (testing not indicated) Cushing's syndrome: (testing not indicated)  Coarctation of the aorta (testing not indicated)   # CAD:  # Hyperlipidemia:  Moderate CAD medically managed.  Not an active issue.  Continue aspirin, clopidogrel, metoprolol and atorvastatin.    Disposition:    FU with MD/PharmD in 1 month    Medication Adjustments/Labs and Tests Ordered: Current medicines are reviewed at length with the patient today.  Concerns regarding medicines are outlined above.  Orders Placed This Encounter  Procedures  . TSH   No orders of the defined types were placed in this encounter.    Signed, Chilton Si, MD  11/16/2019 2:58 PM    [facility]

## 2019-11-16 NOTE — Patient Instructions (Addendum)
Medication Instructions:  Your physician recommends that you continue on your current medications as directed. Please refer to the Current Medication list given to you today.   Labwork: TSH TODAY    Testing/Procedures: NONE   Follow-Up: 1 MONTH WITH PHARM D 12/21/2019 AT 11:00 AM    Special Instructions:    MONITOR AND LOG YOUR BLOOD PRESSURES AT HOME TWICE A DAY, BRING READINGS TO YOUR FOLLOW UP   WILL HAVE AMY CARE GUIDE CALL YOU REGARDING STRESS MANAGEMENT   DASH Eating Plan DASH stands for "Dietary Approaches to Stop Hypertension." The DASH eating plan is a healthy eating plan that has been shown to reduce high blood pressure (hypertension). It may also reduce your risk for type 2 diabetes, heart disease, and stroke. The DASH eating plan may also help with weight loss. What are tips for following this plan?  General guidelines  Avoid eating more than 2,300 mg (milligrams) of salt (sodium) a day. If you have hypertension, you may need to reduce your sodium intake to 1,500 mg a day.  Limit alcohol intake to no more than 1 drink a day for nonpregnant women and 2 drinks a day for men. One drink equals 12 oz of beer, 5 oz of wine, or 1 oz of hard liquor.  Work with your health care provider to maintain a healthy body weight or to lose weight. Ask what an ideal weight is for you.  Get at least 30 minutes of exercise that causes your heart to beat faster (aerobic exercise) most days of the week. Activities may include walking, swimming, or biking.  Work with your health care provider or diet and nutrition specialist (dietitian) to adjust your eating plan to your individual calorie needs. Reading food labels   Check food labels for the amount of sodium per serving. Choose foods with less than 5 percent of the Daily Value of sodium. Generally, foods with less than 300 mg of sodium per serving fit into this eating plan.  To find whole grains, look for the word "whole" as the first  word in the ingredient list. Shopping  Buy products labeled as "low-sodium" or "no salt added."  Buy fresh foods. Avoid canned foods and premade or frozen meals. Cooking  Avoid adding salt when cooking. Use salt-free seasonings or herbs instead of table salt or sea salt. Check with your health care provider or pharmacist before using salt substitutes.  Do not fry foods. Cook foods using healthy methods such as baking, boiling, grilling, and broiling instead.  Cook with heart-healthy oils, such as olive, canola, soybean, or sunflower oil. Meal planning  Eat a balanced diet that includes: ? 5 or more servings of fruits and vegetables each day. At each meal, try to fill half of your plate with fruits and vegetables. ? Up to 6-8 servings of whole grains each day. ? Less than 6 oz of lean meat, poultry, or fish each day. A 3-oz serving of meat is about the same size as a deck of cards. One egg equals 1 oz. ? 2 servings of low-fat dairy each day. ? A serving of nuts, seeds, or beans 5 times each week. ? Heart-healthy fats. Healthy fats called Omega-3 fatty acids are found in foods such as flaxseeds and coldwater fish, like sardines, salmon, and mackerel.  Limit how much you eat of the following: ? Canned or prepackaged foods. ? Food that is high in trans fat, such as fried foods. ? Food that is high in saturated fat,  such as fatty meat. ? Sweets, desserts, sugary drinks, and other foods with added sugar. ? Full-fat dairy products.  Do not salt foods before eating.  Try to eat at least 2 vegetarian meals each week.  Eat more home-cooked food and less restaurant, buffet, and fast food.  When eating at a restaurant, ask that your food be prepared with less salt or no salt, if possible. What foods are recommended? The items listed may not be a complete list. Talk with your dietitian about what dietary choices are best for you. Grains Whole-grain or whole-wheat bread. Whole-grain or  whole-wheat pasta. Brown rice. [place] Morrow. Bulgur. Whole-grain and low-sodium cereals. Pita bread. Low-fat, low-sodium crackers. Whole-wheat flour tortillas. Vegetables Fresh or frozen vegetables (raw, steamed, roasted, or grilled). Low-sodium or reduced-sodium tomato and vegetable juice. Low-sodium or reduced-sodium tomato sauce and tomato paste. Low-sodium or reduced-sodium canned vegetables. Fruits All fresh, dried, or frozen fruit. Canned fruit in natural juice (without added sugar). Meat and other protein foods Skinless chicken or [place]. Ground chicken or [place]. Pork with fat trimmed off. Fish and seafood. Egg whites. Dried beans, peas, or lentils. Unsalted nuts, nut butters, and seeds. Unsalted canned beans. Lean cuts of beef with fat trimmed off. Low-sodium, lean deli meat. Dairy Low-fat (1%) or fat-free (skim) milk. Fat-free, low-fat, or reduced-fat cheeses. Nonfat, low-sodium ricotta or cottage cheese. Low-fat or nonfat yogurt. Low-fat, low-sodium cheese. Fats and oils Soft margarine without trans fats. Vegetable oil. Low-fat, reduced-fat, or light mayonnaise and salad dressings (reduced-sodium). Canola, safflower, olive, soybean, and sunflower oils. Avocado. Seasoning and other foods Herbs. Spices. Seasoning mixes without salt. Unsalted popcorn and pretzels. Fat-free sweets. What foods are not recommended? The items listed may not be a complete list. Talk with your dietitian about what dietary choices are best for you. Grains Baked goods made with fat, such as croissants, muffins, or some breads. Dry pasta or rice meal packs. Vegetables Creamed or fried vegetables. Vegetables in a cheese sauce. Regular canned vegetables (not low-sodium or reduced-sodium). Regular canned tomato sauce and paste (not low-sodium or reduced-sodium). Regular tomato and vegetable juice (not low-sodium or reduced-sodium). Angie Fava. Olives. Fruits Canned fruit in a light or heavy syrup. Fried fruit. Fruit  in cream or butter sauce. Meat and other protein foods Fatty cuts of meat. Ribs. Fried meat. Berniece Salines. Sausage. Bologna and other processed lunch meats. Salami. Fatback. Hotdogs. Bratwurst. Salted nuts and seeds. Canned beans with added salt. Canned or smoked fish. Whole eggs or egg yolks. Chicken or [place] with skin. Dairy Whole or 2% milk, cream, and half-and-half. Whole or full-fat cream cheese. Whole-fat or sweetened yogurt. Full-fat cheese. Nondairy creamers. Whipped toppings. Processed cheese and cheese spreads. Fats and oils Butter. Stick margarine. Lard. Shortening. Ghee. Bacon fat. Tropical oils, such as coconut, palm kernel, or palm oil. Seasoning and other foods Salted popcorn and pretzels. Onion salt, garlic salt, seasoned salt, table salt, and sea salt. Worcestershire sauce. Tartar sauce. Barbecue sauce. Teriyaki sauce. Soy sauce, including reduced-sodium. Steak sauce. Canned and packaged gravies. Fish sauce. Oyster sauce. Cocktail sauce. Horseradish that you find on the shelf. Ketchup. Mustard. Meat flavorings and tenderizers. Bouillon cubes. Hot sauce and Tabasco sauce. Premade or packaged marinades. Premade or packaged taco seasonings. Relishes. Regular salad dressings. Where to find more information:  National Heart, Lung, and [place]: https://wilson-eaton.com/  American Heart Association: www.heart.org Summary  The DASH eating plan is a healthy eating plan that has been shown to reduce high blood pressure (hypertension). It may also reduce your  risk for type 2 diabetes, heart disease, and stroke.  With the DASH eating plan, you should limit salt (sodium) intake to 2,300 mg a day. If you have hypertension, you may need to reduce your sodium intake to 1,500 mg a day.  When on the DASH eating plan, aim to eat more fresh fruits and vegetables, whole grains, lean proteins, low-fat dairy, and heart-healthy fats.  Work with your health care provider or diet and nutrition specialist  (dietitian) to adjust your eating plan to your individual calorie needs. This information is not intended to replace advice given to you by your health care provider. Make sure you discuss any questions you have with your health care provider. Document Released: 02/05/2011 Document Revised: 01/29/2017 Document Reviewed: 02/10/2016 Elsevier Patient Education  2020 [employer].

## 2019-11-17 ENCOUNTER — Telehealth: Payer: Self-pay

## 2019-11-17 DIAGNOSIS — Z0000 Encounter for general adult medical examination without abnormal findings: Secondary | ICD-10-CM

## 2019-11-17 LAB — TSH: TSH: 1.61 u[IU]/mL (ref 0.450–4.500)

## 2019-11-17 NOTE — Telephone Encounter (Signed)
Called patient to touch base with them regarding health coaching to see if they are interested to aid in stress management per Dr. Leonides Sake referral. Left patient a message to return call to Care Guide at [phone].

## 2019-11-17 NOTE — Telephone Encounter (Signed)
Patient returned call and discussed potential of setting up initial health coaching session with Care Guide for stress management. Patient stated that she is aware of her stress and trigger (family, and unsolicited responsibilities). Patient was advised that health coaching can help her set up strategies to help manage these concerns. Patient stated that nothing can help her with her family issues. Care Guide informed the patient that the health coaching offer still stands. Patient stated that they understood.

## 2019-12-04 ENCOUNTER — Ambulatory Visit: Payer: Medicaid Other | Admitting: Family Medicine

## 2019-12-21 ENCOUNTER — Ambulatory Visit: Payer: Medicaid Other

## 2019-12-29 ENCOUNTER — Encounter: Payer: Self-pay | Admitting: Family Medicine

## 2019-12-29 ENCOUNTER — Other Ambulatory Visit: Payer: Self-pay

## 2019-12-29 ENCOUNTER — Ambulatory Visit (INDEPENDENT_AMBULATORY_CARE_PROVIDER_SITE_OTHER): Payer: Medicaid Other | Admitting: Family Medicine

## 2019-12-29 ENCOUNTER — Telehealth: Payer: Self-pay | Admitting: Family Medicine

## 2019-12-29 VITALS — BP 131/60 | HR 52 | Ht 66.0 in | Wt 279.2 lb

## 2019-12-29 DIAGNOSIS — N644 Mastodynia: Secondary | ICD-10-CM

## 2019-12-29 DIAGNOSIS — N9089 Other specified noninflammatory disorders of vulva and perineum: Secondary | ICD-10-CM

## 2019-12-29 DIAGNOSIS — N898 Other specified noninflammatory disorders of vagina: Secondary | ICD-10-CM | POA: Diagnosis not present

## 2019-12-29 DIAGNOSIS — I10 Essential (primary) hypertension: Secondary | ICD-10-CM | POA: Diagnosis not present

## 2019-12-29 DIAGNOSIS — B9689 Other specified bacterial agents as the cause of diseases classified elsewhere: Secondary | ICD-10-CM

## 2019-12-29 DIAGNOSIS — N760 Acute vaginitis: Secondary | ICD-10-CM | POA: Diagnosis not present

## 2019-12-29 DIAGNOSIS — I739 Peripheral vascular disease, unspecified: Secondary | ICD-10-CM | POA: Diagnosis not present

## 2019-12-29 LAB — POCT WET PREP (WET MOUNT)
Clue Cells Wet Prep Whiff POC: POSITIVE
Trichomonas Wet Prep HPF POC: ABSENT

## 2019-12-29 MED ORDER — METRONIDAZOLE 500 MG PO TABS: 500.0000 mg | ORAL_TABLET | Freq: Two times a day (BID) | ORAL | 0 refills | Status: DC

## 2019-12-29 MED ORDER — CYCLOBENZAPRINE HCL 5 MG PO TABS: 5.0000 mg | ORAL_TABLET | Freq: Three times a day (TID) | ORAL | 2 refills | Status: DC | PRN

## 2019-12-29 MED ORDER — METOPROLOL TARTRATE 100 MG PO TABS: 100.0000 mg | ORAL_TABLET | Freq: Two times a day (BID) | ORAL | 2 refills | Status: DC

## 2019-12-29 MED ORDER — AMLODIPINE BESYLATE 10 MG PO TABS
10.0000 mg | ORAL_TABLET | Freq: Every day | ORAL | 1 refills | Status: DC
Start: 2019-12-29 — End: 2020-09-17

## 2019-12-29 MED ORDER — ROSUVASTATIN CALCIUM 10 MG PO TABS: 10.0000 mg | ORAL_TABLET | Freq: Every day | ORAL | 3 refills | Status: DC

## 2019-12-29 MED ORDER — CLOPIDOGREL BISULFATE 75 MG PO TABS: 75.0000 mg | ORAL_TABLET | Freq: Every day | ORAL | 3 refills | Status: DC

## 2019-12-29 MED ORDER — LOSARTAN POTASSIUM 50 MG PO TABS
50.0000 mg | ORAL_TABLET | Freq: Every day | ORAL | 3 refills | Status: DC
Start: 2019-12-29 — End: 2020-03-21

## 2019-12-29 NOTE — Assessment & Plan Note (Signed)
At goal. No side effects, we discussed that it is unlikely that medications are causing side effects of the breast lesion.

## 2019-12-29 NOTE — Telephone Encounter (Signed)
Patient is calling and would like for Dr. Manson Passey to place a referral for her to [facility].

## 2019-12-29 NOTE — Patient Instructions (Addendum)
Please call the breast center to schedule an appointment about the left breast lesion.  I recommend seeing a dermatologist for your recurrent lesions in the vaginal area.  Please use Aveeno gentle cleanser rather than Dial.  Please stop your Lipitor the big pill that is difficult for you to swallow.  I have sent an alternative called Crestor also called rosuvastatin that he can take at night.  I will call you with the results of your wet prep  Please schedule follow-up in 4 weeks.   It was wonderful to see you today.  Please bring ALL of your medications with you to every visit.  Thank you so much for bringing all your pills with you today  Today we talked about:  Thank you for choosing [facility].   Please call [phone] with any questions about today's appointment.  Please be sure to schedule follow up at the front  desk before you leave today.   [name], MD  Family Medicine

## 2019-12-29 NOTE — Progress Notes (Signed)
SUBJECTIVE:   CHIEF COMPLAINT / HPI:   Tammy [name] is a pleasant 64 year old woman with history significant for hypertension, tobacco abuse, obesity, recurrent vulvar abscesses, peripheral arterial disease and coronary disease presenting today for routine follow-up.  She has a number of concerns today.  The patient reports that it is difficult for her to take her atorvastatin due to the large size of the pills.  She tolerates medication fine he would like an alternative.  She denies side effects.  She reports compliance with the remainder of her medications including her hypertension pills.  The patient is very worried her antihypertensives are causing outbreaks on her breasts.  She reports left breast pain and a lesion at the 1 o'clock position over the nipple for the past few weeks.  This is worsened over the past few days.  She has tried a number of things for this including changing her bra and changing her soap.  She is using Dial soap regularly.  She denies lymphadenopathy, fevers.  She has a prior history of abnormal mammograms and a previous biopsy demonstrating fat necrosis.  Tammy [name] has had ongoing issues with perineal abscesses. She reports this has been worsening over the past several weeks.  She has been using an herbal soap in the vaginal area and Dial soap on her body.  She also uses clindamycin topical treatments when the abscesses appear to be infected.  She denies vaginal discharge.  She has no new sexual partners.  She denies any vaginal bleeding. PERTINENT  PMH / PSH/Family/Social History : History of fat necrosis of the left breast.,  She had an NSTEMI several years ago and a catheterization at that time.  She is on both aspirin and clopidogrel for underlying peripheral artery disease which was previously diagnosed.  She continues to smoke and is precontemplative about quitting.  OBJECTIVE:   BP 131/60   Pulse (!) 52   Ht 5\' 6"  (1.676 m)   Wt 279 lb 3.2 oz (126.6  kg)   SpO2 95%   BMI 45.06 kg/m   Today's weight:  Last Weight  Most recent update: 12/29/2019 11:02 AM   Weight  126.6 kg (279 lb 3.2 oz)           Review of prior weights: Filed Weights   12/29/19 1101  Weight: 279 lb 3.2 oz (126.6 kg)   Chaperoned exam, bilateral breasts with some hyperpigmented scars on both.  The left breast has an area at the 1:00 area that is hypopigmented's very superficial ulcer without underlying mass.  No lymphadenopathy bilaterally.  No nipple discharge expressed or on exam.  Pelvic exam also chaperoned.  Vulvar exam notable for multiple small nodules that are hyperpigmented in various stages of healing with evidence of trauma and injury probably from skin to skin contact.  The vaginal exam is notable for moderate discharge with a positive whiff test.  There is no bleeding no masses.  Cardiac: Warm well perfused.  Capillary refill less than 3 seconds Respiratory breathing comfortably on room air Psych: Pleasant normal affect, appropriate, normal rate of speech  ASSESSMENT/PLAN:   HYPERTENSION, BENIGN SYSTEMIC At goal. No side effects, we discussed that it is unlikely that medications are causing side effects of the breast lesion.   Recurrent vulvar lesions, possibly due to irritation and subsequent sebaceous hyperplasia, hidradenitis also suspected based upon history others no significant scarring lesions are not deep.  Discussed at length.  Encouraged her to use a gentle unscented cleanser  which would be Aveeno or similar not Dial.  Discouraged use of herbal vaginal cleansers.  Bacterial vaginosis, called and treated with metronidazole 500 mg twice daily discussed that should not be taken with alcohol.  She does not drink.  Offered STI testing and she declined at this time but would recommend repeat at follow-up.  Left breast lesion, no palpable masses but given history and age recommend mammogram.  This was ordered and discussed with the  patient.  Medication review, the patient is intolerant of her statin pill size.  She is a candidate for high-dose statin.  We will start rosuvastatin 10 and increase from there.  I recognize this is a moderate intensity statin however the patient has intolerance to multiple medications.  Discussed importance with adherence.  Medications reviewed and updated as appropriate.   Healthcare maintenance the patient reports she is already got her second Covid vaccine and is not yet due for booster.  Asked her to bring documentation as to such to her follow-up appointment. We briefly discussed tobacco cessation today, she reports her stress with her family and her sister and we will address further at follow-up.  Tammy Starr, MD  Family Medicine Teaching Service  [facility]

## 2020-01-01 ENCOUNTER — Other Ambulatory Visit: Payer: Self-pay | Admitting: Family Medicine

## 2020-01-01 DIAGNOSIS — N644 Mastodynia: Secondary | ICD-10-CM

## 2020-01-11 ENCOUNTER — Ambulatory Visit (INDEPENDENT_AMBULATORY_CARE_PROVIDER_SITE_OTHER): Payer: Medicaid Other | Admitting: Pharmacist Clinician (PhC)/ Clinical Pharmacy Specialist

## 2020-01-11 DIAGNOSIS — I10 Essential (primary) hypertension: Secondary | ICD-10-CM

## 2020-01-11 NOTE — Progress Notes (Signed)
01/15/2020 Tammy [name] [birth year] [id number]   HPI:  Tammy [name] is a 64 y.o. female patient of Dr Duke Salvia, with a PMH below who presents today for advanced hypertension clinic follow up.  She was seen by Dr. Duke Salvia in September and was doing well with a blood pressure of 116/80.  No medication changes were made and patient noted that stress was the biggest challenge related to her pressure.    She returns today for follow up.  She does not have a home BP cuff but has thought about getting one.  States no problems with medication compliance.  Does not know all her medications by name, but knows that she takes 3 each morning and 5 each night.    Past Medical History: hyperlipidemia 01/2019 - TC 143, TG 59, HDL 51, LDL 80 - on rosuvastatin 10 mg  PAD 2016 ABI R 0.88, L 0.32  CAD S/p NSTEMI (manged medically) 2016     Blood Pressure Goal:  130/80  Current Medications: amlodipine 10 mg, indapamide 2.5 mg, losartan 50 mg, metoprolol 100 mg bid  Family Hx: father living with DM, htn, now 48; mother died at 19 from cancer; 4 siblings, all with hypertension; 1 adult child, no issues yet with hypertension  Social Hx: smokes depending on stress - 1/2-1 ppd; no alcohol, caffeine - sweet tea  Diet: cooks mostly at home, uses salt with cooking but not at the table   Exercise: no regular exercise  Home BP readings: was given home BP cuff at appointment with Dr. Duke Salvia, however she states it doesn't work, as there is no plug in adapter.    Intolerances: ACEI and HCTZ both caused "rash on breast"  Labs: 6/21: Na 137, K 4.3, Glu 106, BUN 20, SCr 1.07   Wt Readings from Last 3 Encounters:  01/11/20 284 lb 3.2 oz (128.9 kg)  12/29/19 279 lb 3.2 oz (126.6 kg)  11/16/19 276 lb (125.2 kg)   BP Readings from Last 3 Encounters:  01/11/20 124/68  12/29/19 131/60  11/16/19 116/80   Pulse Readings from Last 3 Encounters:  01/11/20 68  12/29/19 (!) 52  11/16/19 (!) 52     Current Outpatient Medications  Medication Sig Dispense Refill   amLODipine (NORVASC) 10 MG tablet Take 1 tablet (10 mg total) by mouth daily. 90 tablet 1   aspirin 81 MG EC tablet Take 1 tablet (81 mg total) by mouth daily. 30 tablet 6   Blood Pressure Monitoring (BLOOD PRESSURE CUFF) MISC Use to check blood pressure 1 each 0   chlorhexidine (HIBICLENS) 4 % external liquid Apply topically daily as needed. 120 mL 0   clindamycin (CLEOCIN-T) 1 % external solution Apply topically 2 (two) times daily. Apply to lesion on mons pubis twice daily for 1 week 60 mL 2   clopidogrel (PLAVIX) 75 MG tablet Take 1 tablet (75 mg total) by mouth daily. 90 tablet 3   cyclobenzaprine (FLEXERIL) 5 MG tablet Take 1 tablet (5 mg total) by mouth 3 (three) times daily as needed for muscle spasms. 10 tablet 2   diclofenac Sodium (VOLTAREN) 1 % GEL Apply 4 g topically 4 (four) times daily. 100 g 2   indapamide (LOZOL) 2.5 MG tablet Take 1 tablet (2.5 mg total) by mouth daily. 90 tablet 3   losartan (COZAAR) 50 MG tablet Take 1 tablet (50 mg total) by mouth at bedtime. 90 tablet 3   metoprolol tartrate (LOPRESSOR) 100 MG tablet Take 1 tablet (  100 mg total) by mouth 2 (two) times daily. 180 tablet 2   metroNIDAZOLE (FLAGYL) 500 MG tablet Take 1 tablet (500 mg total) by mouth 2 (two) times daily. 14 tablet 0   mupirocin ointment (BACTROBAN) 2 % Apply to affected area once daily at night 22 g 0   nitroGLYCERIN (NITROSTAT) 0.4 MG SL tablet Place 1 tablet (0.4 mg total) under the tongue every 5 (five) minutes as needed for chest pain. 25 tablet 6   rosuvastatin (CRESTOR) 10 MG tablet Take 1 tablet (10 mg total) by mouth at bedtime. 90 tablet 3   Current Facility-Administered Medications  Medication Dose Route Frequency Provider Last Rate Last Admin   acetaminophen (TYLENOL) tablet 975 mg  975 mg Oral Once Renne Musca, MD        Allergies  Allergen Reactions   Ace Inhibitors     REACTION: rash  on breast   Hydrochlorothiazide     REACTION: "rash on breast and breast pain"   Ezetimibe Rash    Vaginal rash.     Past Medical History:  Diagnosis Date   Abnormal mammogram 10.25.11   area of density with several adjacent amorphous calcifications located laterally w/in the right breast at approx 9-10 o'clock position likely represents area of evolving far necrosis; f/u mamo and U/S in 6 months   CAD (coronary artery disease)    a. 07/2014 NSTEMI/Cath: LM nl, LAD 30ost/m, 60d, LCX 20p, OM1 20, OM2 40, OM3 50, RCA 30p, 9m, 20d, RPDA 40, EF 65%-->Med Rx;  b. 07/2014 Echo: EF 55-60%, no rwma, mildly dil LA.   Essential hypertension    GERD (gastroesophageal reflux disease)    Hyperlipidemia    Morbid obesity (HCC)    PAD (peripheral artery disease) (HCC)    a. 07/2014 ABI's: R 0.88, L 0.32.   Ulcer     Blood pressure 124/68, pulse 68, resp. rate 16, height 5\' 6"  (1.676 m), weight 284 lb 3.2 oz (128.9 kg), SpO2 98 %.  HYPERTENSION, BENIGN SYSTEMIC Patient with essential hypertension, currently well controlled.  No issues with medication compliance.  Will have her continue with current regimen.  She was encouraged to come back to the office next Monday with her home BP cuff, and we can teach her proper use of the device.  I don't feel that there is a need for further advanced hypertension follow up at this time.  Will refer back to Dr. Thursday to determine follow up.     Duke Salvia PharmD CPP [facility] [address] [place], [place] [phone]

## 2020-01-11 NOTE — Patient Instructions (Signed)
°  Return on Monday around 11 am with your BP cuff so we can determine if it works  Check your blood pressure at home daily and keep record of the readings.  Take your BP meds as follows:  Continue with all current medications  Bring all of your meds, your BP cuff and your record of home blood pressures to your next appointment.  Exercise as youre able, try to walk approximately 30 minutes per day.  Keep salt intake to a minimum, especially watch canned and prepared boxed foods.  Eat more fresh fruits and vegetables and fewer canned items.  Avoid eating in fast food restaurants.    HOW TO TAKE YOUR BLOOD PRESSURE:  Rest 5 minutes before taking your blood pressure.   Dont smoke or drink caffeinated beverages for at least 30 minutes before.  Take your blood pressure before (not after) you eat.  Sit comfortably with your back supported and both feet on the floor (dont cross your legs).  Elevate your arm to heart level on a table or a desk.  Use the proper sized cuff. It should fit smoothly and snugly around your bare upper arm. There should be enough room to slip a fingertip under the cuff. The bottom edge of the cuff should be 1 inch above the crease of the elbow.  Ideally, take 3 measurements at one sitting and record the average.

## 2020-01-15 ENCOUNTER — Encounter: Payer: Self-pay | Admitting: Pharmacist Clinician (PhC)/ Clinical Pharmacy Specialist

## 2020-01-15 NOTE — Assessment & Plan Note (Addendum)
Patient with essential hypertension, currently well controlled.  No issues with medication compliance.  Will have her continue with current regimen.  She was encouraged to come back to the office next Monday with her home BP cuff, and we can teach her proper use of the device.  I don't feel that there is a need for further advanced hypertension follow up at this time.  Will refer back to Dr. Duke Salvia to determine follow up.

## 2020-01-18 ENCOUNTER — Other Ambulatory Visit: Payer: Self-pay

## 2020-01-18 ENCOUNTER — Encounter: Payer: Self-pay | Admitting: Family Medicine

## 2020-01-18 ENCOUNTER — Ambulatory Visit
Admission: RE | Admit: 2020-01-18 | Discharge: 2020-01-18 | Disposition: A | Discharge status: Home / Self Care | Payer: Medicaid Other | Source: Ambulatory Visit | Attending: Family Medicine | Admitting: Family Medicine

## 2020-01-18 DIAGNOSIS — N644 Mastodynia: Secondary | ICD-10-CM

## 2020-01-18 DIAGNOSIS — N6489 Other specified disorders of breast: Secondary | ICD-10-CM | POA: Diagnosis not present

## 2020-01-18 DIAGNOSIS — R928 Other abnormal and inconclusive findings on diagnostic imaging of breast: Secondary | ICD-10-CM | POA: Diagnosis not present

## 2020-01-29 ENCOUNTER — Ambulatory Visit: Payer: Medicaid Other | Admitting: Family Medicine

## 2020-02-15 ENCOUNTER — Telehealth: Payer: Self-pay | Admitting: *Deleted

## 2020-02-15 ENCOUNTER — Other Ambulatory Visit: Payer: Self-pay | Admitting: Cardiovascular Disease

## 2020-02-15 DIAGNOSIS — E782 Mixed hyperlipidemia: Secondary | ICD-10-CM

## 2020-02-15 NOTE — Telephone Encounter (Signed)
-----   Message from Rosalee Kaufman, RPH-CPP sent at 01/16/2020  6:54 AM EST ----- Can you schedule this?  thanks ----- Message ----- From: Chilton Si, MD Sent: 01/15/2020   6:05 PM EST To: Rosalee Kaufman, RPH-CPP  That's great.  3 month follow up with me sounds good. ----- Message ----- From: Rosalee Kaufman, RPH-CPP Sent: 01/15/2020   3:25 PM EST To: Chilton Si, MD, Burnell Blanks, LPN  Hi  Saw her for first ADV HTN visit.  She was 116/80 with you and 124/68 with me.  Didn't schedule a follow up at this time, as I really don't think she needs it now.  Should we maybe schedule her for a 3 month follow up?  She was referred by Dr. Melburn Popper when her pressure was 1922/98.    Belenda Cruise

## 2020-02-15 NOTE — Telephone Encounter (Signed)
Called to schedule patients ADV HTN CLINIC F/U in February Patient did not have her calendar and stated she would call back

## 2020-02-20 NOTE — Telephone Encounter (Signed)
Left message to call back  

## 2020-03-11 ENCOUNTER — Ambulatory Visit: Payer: Medicaid Other | Admitting: Family Medicine

## 2020-03-11 ENCOUNTER — Telehealth: Payer: Self-pay | Admitting: Family Medicine

## 2020-03-11 NOTE — Telephone Encounter (Signed)
Please call and schedule virtual visit at next available.  [name], MD  Family Medicine Teaching Service

## 2020-03-11 NOTE — Telephone Encounter (Signed)
Patient called to cancel her appointment today due to having sick symptoms the last few days. She would like to know if Dr. Manson Passey can call her to discuss the issues she is having.   The best call back number is [phone]

## 2020-03-12 NOTE — Telephone Encounter (Signed)
LMOVM informing pt of her virtual appt. And let her know if it wasn't a good time to call back and let us know .Sissy Goetzke Bruna Potter, CMA

## 2020-03-15 ENCOUNTER — Encounter: Payer: Self-pay | Admitting: Family Medicine

## 2020-03-15 ENCOUNTER — Telehealth (INDEPENDENT_AMBULATORY_CARE_PROVIDER_SITE_OTHER): Payer: Medicaid Other | Admitting: Family Medicine

## 2020-03-15 ENCOUNTER — Other Ambulatory Visit: Payer: Self-pay

## 2020-03-15 DIAGNOSIS — E782 Mixed hyperlipidemia: Secondary | ICD-10-CM | POA: Diagnosis not present

## 2020-03-15 DIAGNOSIS — Z659 Problem related to unspecified psychosocial circumstances: Secondary | ICD-10-CM | POA: Diagnosis not present

## 2020-03-15 DIAGNOSIS — I10 Essential (primary) hypertension: Secondary | ICD-10-CM

## 2020-03-15 DIAGNOSIS — F17200 Nicotine dependence, unspecified, uncomplicated: Secondary | ICD-10-CM

## 2020-03-15 NOTE — Assessment & Plan Note (Signed)
We will continue to discuss, encouraged decreasing use.

## 2020-03-15 NOTE — Assessment & Plan Note (Signed)
Discussed repeat metabolic panel next week.

## 2020-03-15 NOTE — Assessment & Plan Note (Signed)
Given recent symptoms and illness will repeat BMP.  She has a new blood pressure cuff at home and I encouraged her to start taking her blood pressure once a day.  This is a wrist cuff and may overestimate her blood pressure slightly.

## 2020-03-15 NOTE — Progress Notes (Signed)
[facility] Telemedicine Visit  Patient consented to have virtual visit and was identified by name and date of birth. Method of visit: Telephone  Encounter participants: Patient: Tammy [name] - located at home Provider: Westley Chandler - located at [facility] Others (if applicable): NA  Chief Complaint: body aches   HPI: The patient is a very pleasant 65 year old woman with a history significant for hypertension, dyslipidemia and tobacco use presenting today for a telephone visit.  Her in person visit was canceled as she is feeling unwell.  She reports 2 weeks ago she started feeling unwell with a body aches, general weakness and feelings of fevers.  She denies continued fevers, dyspnea, chest pain.  She reports she is not eating and drinking well.  She is avoiding a normal amount.  She reports she did take her medications throughout this time.  Did feel a bit lightheaded.  She denies falls or other symptoms at this time.  She reports she is on the mend and would like to have her blood work checked.  She reports she is working on cutting down on her cigarettes but has not made much progress.  She is concerned about when she will get her booster she is not yet back to her baseline.  She did not get COVID tested during this period but thinks she may have had an exposure at some point.  ROS: per HPI  Pertinent PMHx: Hypertension, tobacco use, dyslipidemia, recurrent vulvar lesions  Exam:  Appropriate talkative, speaking in full sentences   Assessment/Plan:  TOBACCO DEPENDENCE We will continue to discuss, encouraged decreasing use.  HYPERTENSION, BENIGN SYSTEMIC Given recent symptoms and illness will repeat BMP.  She has a new blood pressure cuff at home and I encouraged her to start taking her blood pressure once a day.  This is a wrist cuff and may overestimate her blood pressure slightly.  HLD (hyperlipidemia) Discussed repeat metabolic panel next week.  Recent  Illness suspect she may have been dehydrated with recent viral illness, question if COVID19, certainly likely.  Differential also includes acute kidney injury or other infectious syndrome.  If it was COVID-19 she is certainly outside the infectious window as it has been greater than 14 days.  Scheduled for lab visit next Wednesday.  The patient reports barrier to transportation as below we will consult CCM to see if we can arrange [facility] transportation.  The patient is agreeable to such a referral.  Transportation Barriers Referral to CCM, amenable to such    Time spent during visit with patient: 13 minutes  [name], MD  [facility] Medicine Teaching Service

## 2020-03-18 ENCOUNTER — Telehealth: Payer: Self-pay

## 2020-03-18 NOTE — Telephone Encounter (Signed)
    Telephone encounter was:  Successful.  03/18/20 Name: MAKIYA JEUNE MRN: [id number] DOB: Jul 07, 1955  ALIAH ERIKSSON is a 65 year old female who is a primary care patient of Westley Chandler, MD . The community resource team was consulted for assistance with Transportation Needs   Care guide performed the following interventions: Patient provided with information about care guide support team and interviewed to confirm resource needs Placed referral to [employer] via email Gave patient information fo [employer] for future appointments..  Follow Up Plan:  Spoke with Jonelle Sidle at [facility] to confirm request was received and requested a confirmation be sent to me once it is processed and the patient is called.    Zyden Suman, AAS Paralegal, [facility] Care Guide . Embedded Care Coordination [facility]  Care Management  [address] [place], [place] [address] [email or web address]  [phone]   [email or web address]

## 2020-03-19 ENCOUNTER — Telehealth: Payer: Self-pay | Admitting: Family Medicine

## 2020-03-19 ENCOUNTER — Telehealth: Payer: Self-pay

## 2020-03-19 NOTE — Telephone Encounter (Signed)
   Tammy [name] DOB: [birth year] MRN: [id number]   RIDER WAIVER AND RELEASE OF LIABILITY  For purposes of improving physical access to our facilities, [facility] is pleased to partner with third parties to provide [facility] patients or other authorized individuals the option of convenient, on-demand ground transportation services (the [employer]") through use of the technology service that enables users to request on-demand ground transportation from independent third-party providers.  By opting to use and accept these [employer], I, the undersigned, hereby agree on behalf of myself, and on behalf of any minor child using the [employer] for whom I am the parent or legal guardian, as follows:  1. Science writer provided to me are provided by independent third-party transportation providers who are not [employer] or employees and who are unaffiliated with [employer]. 2. [facility] is neither a transportation carrier nor a common or public carrier. 3. [facility] has no control over the quality or safety of the transportation that occurs as a result of the [employer]. 4. [facility] cannot guarantee that any third-party transportation provider will complete any arranged transportation service. 5. [facility] makes no representation, warranty, or guarantee regarding the reliability, timeliness, quality, safety, suitability, or availability of any of the Transport Services or that they will be error free. 6. I fully understand that traveling by vehicle involves risks and dangers of serious bodily injury, including permanent disability, paralysis, and death. I agree, on behalf of myself and on behalf of any minor child using the Transport Services for whom I am the parent or legal guardian, that the entire risk arising out of my use of the [employer] remains solely with me, to the maximum extent permitted under applicable law. 7. The [employer] are provided "as is" and "as available." [facility] disclaims all representations and warranties, express, implied or statutory, not expressly set out in these terms, including the implied warranties of merchantability and fitness for a particular purpose. 8. I hereby waive and release [facility], its agents, employees, officers, directors, representatives, insurers, attorneys, assigns, successors, subsidiaries, and affiliates from any and all past, present, or future claims, demands, liabilities, actions, causes of action, or suits of any kind directly or indirectly arising from acceptance and use of the [employer]. 9. I further waive and release [facility] and its affiliates from all present and future liability and responsibility for any injury or death to persons or damages to property caused by or related to the use of the [employer]. 10. I have read this Waiver and Release of Liability, and I understand the terms used in it and their legal significance. This Waiver is freely and voluntarily given with the understanding that my right (as well as the right of any minor child for whom I am the parent or legal guardian using the [employer]) to legal recourse against  in connection with the [employer] is knowingly surrendered in return for use of these services.   I attest that I read the consent document to Tammy [name], gave Ms. Drakes the opportunity to ask questions and answered the questions asked (if any). I affirm that Tammy [name] then provided consent for she's participation in this program.     Ephriam Knuckles Vilsaint

## 2020-03-19 NOTE — Telephone Encounter (Signed)
   Telephone encounter was:  Successful.  03/19/2020 Name: Tammy [name] MRN: [id number] DOB: 1956-01-25  Tammy [name] is a 65 y.o. year old female who is a primary care patient of Westley Chandler, MD . The community resource team was consulted for assistance with Transportation Needs   Care guide performed the following interventions: Follow up call placed to the patient to discuss status of referral Confirmed that patient has received a call from [employer]. Patient was given information about the program, enrolled and set-up for transportation on 03/20/20. .  Follow Up Plan:  No further follow up planned at this time. The patient has been provided with needed resources. and Received email confirmation from Windell Moment at [facility] patient was made aware of ETA for tomorrow. Dr. [name] cc'd on confirmation.  SIG Olean Ree

## 2020-03-20 ENCOUNTER — Other Ambulatory Visit: Payer: Medicaid Other

## 2020-03-20 ENCOUNTER — Other Ambulatory Visit: Payer: Self-pay

## 2020-03-20 DIAGNOSIS — I10 Essential (primary) hypertension: Secondary | ICD-10-CM | POA: Diagnosis not present

## 2020-03-20 DIAGNOSIS — E782 Mixed hyperlipidemia: Secondary | ICD-10-CM | POA: Diagnosis not present

## 2020-03-21 ENCOUNTER — Other Ambulatory Visit: Payer: Medicaid Other

## 2020-03-21 ENCOUNTER — Telehealth: Payer: Self-pay | Admitting: Family Medicine

## 2020-03-21 DIAGNOSIS — N179 Acute kidney failure, unspecified: Secondary | ICD-10-CM

## 2020-03-21 LAB — BASIC METABOLIC PANEL
BUN/Creatinine Ratio: 21 (ref 12–28)
BUN: 40 mg/dL — ABNORMAL HIGH (ref 8–27)
CO2: 20 mmol/L (ref 20–29)
Calcium: 9.5 mg/dL (ref 8.7–10.3)
Chloride: 101 mmol/L (ref 96–106)
Creatinine, Ser: 1.9 mg/dL — ABNORMAL HIGH (ref 0.57–1.00)
GFR calc Af Amer: 32 mL/min/{1.73_m2} — ABNORMAL LOW (ref >59)
GFR calc non Af Amer: 28 mL/min/{1.73_m2} — ABNORMAL LOW (ref >59)
Glucose: 156 mg/dL — ABNORMAL HIGH (ref 65–99)
Potassium: 5.5 mmol/L — ABNORMAL HIGH (ref 3.5–5.2)
Sodium: 136 mmol/L (ref 134–144)

## 2020-03-21 LAB — LIPID PANEL
Chol/HDL Ratio: 3.3 ratio (ref 0.0–4.4)
Cholesterol, Total: 145 mg/dL (ref 100–199)
HDL: 44 mg/dL (ref >39)
LDL Chol Calc (NIH): 82 mg/dL (ref 0–99)
Triglycerides: 103 mg/dL (ref 0–149)
VLDL Cholesterol Cal: 19 mg/dL (ref 5–40)

## 2020-03-21 NOTE — Telephone Encounter (Signed)
Called patient about AKI and hyperkalemia- 5.5. She feels markedly improved.   Stop lozol (diuretic) and losartan, repeat tomorrow, strict precautions given.   Given AKI, repeat tomorrow. Renal function panel ordered, will reach out to CCM to see if we can arrange another ride as patient has significant issues with transport.   [name], MD  Family Medicine Teaching Service

## 2020-03-22 ENCOUNTER — Other Ambulatory Visit: Payer: Medicaid Other

## 2020-03-22 ENCOUNTER — Other Ambulatory Visit: Payer: Self-pay

## 2020-03-22 DIAGNOSIS — N179 Acute kidney failure, unspecified: Secondary | ICD-10-CM

## 2020-03-23 ENCOUNTER — Other Ambulatory Visit: Payer: Self-pay | Admitting: Family Medicine

## 2020-03-23 DIAGNOSIS — N179 Acute kidney failure, unspecified: Secondary | ICD-10-CM

## 2020-03-23 LAB — RENAL FUNCTION PANEL
Albumin: 4.2 g/dL (ref 3.8–4.8)
BUN/Creatinine Ratio: 19 (ref 12–28)
BUN: 34 mg/dL — ABNORMAL HIGH (ref 8–27)
CO2: 18 mmol/L — ABNORMAL LOW (ref 20–29)
Calcium: 9.7 mg/dL (ref 8.7–10.3)
Chloride: 102 mmol/L (ref 96–106)
Creatinine, Ser: 1.79 mg/dL — ABNORMAL HIGH (ref 0.57–1.00)
GFR calc Af Amer: 34 mL/min/{1.73_m2} — ABNORMAL LOW (ref >59)
GFR calc non Af Amer: 30 mL/min/{1.73_m2} — ABNORMAL LOW (ref >59)
Glucose: 179 mg/dL — ABNORMAL HIGH (ref 65–99)
Phosphorus: 4.1 mg/dL (ref 3.0–4.3)
Potassium: 4.8 mmol/L (ref 3.5–5.2)
Sodium: 138 mmol/L (ref 134–144)

## 2020-03-24 IMAGING — MG DIGITAL DIAGNOSTIC UNILATERAL LEFT MAMMOGRAM WITH TOMO AND CAD
4 series · 4 of 12 positions shown · non-contrast
Comparison: Previous exam(s).
COMPARISON: Previous exam(s).

Addendum:
CLINICAL DATA: Callback from screening mammogram for possible
asymmetry left breast. Patient denies recent or remote trauma.

EXAM:
DIGITAL DIAGNOSTIC LEFT MAMMOGRAM WITH TOMO
ULTRASOUND LEFT BREAST

[L CC synth-2D]
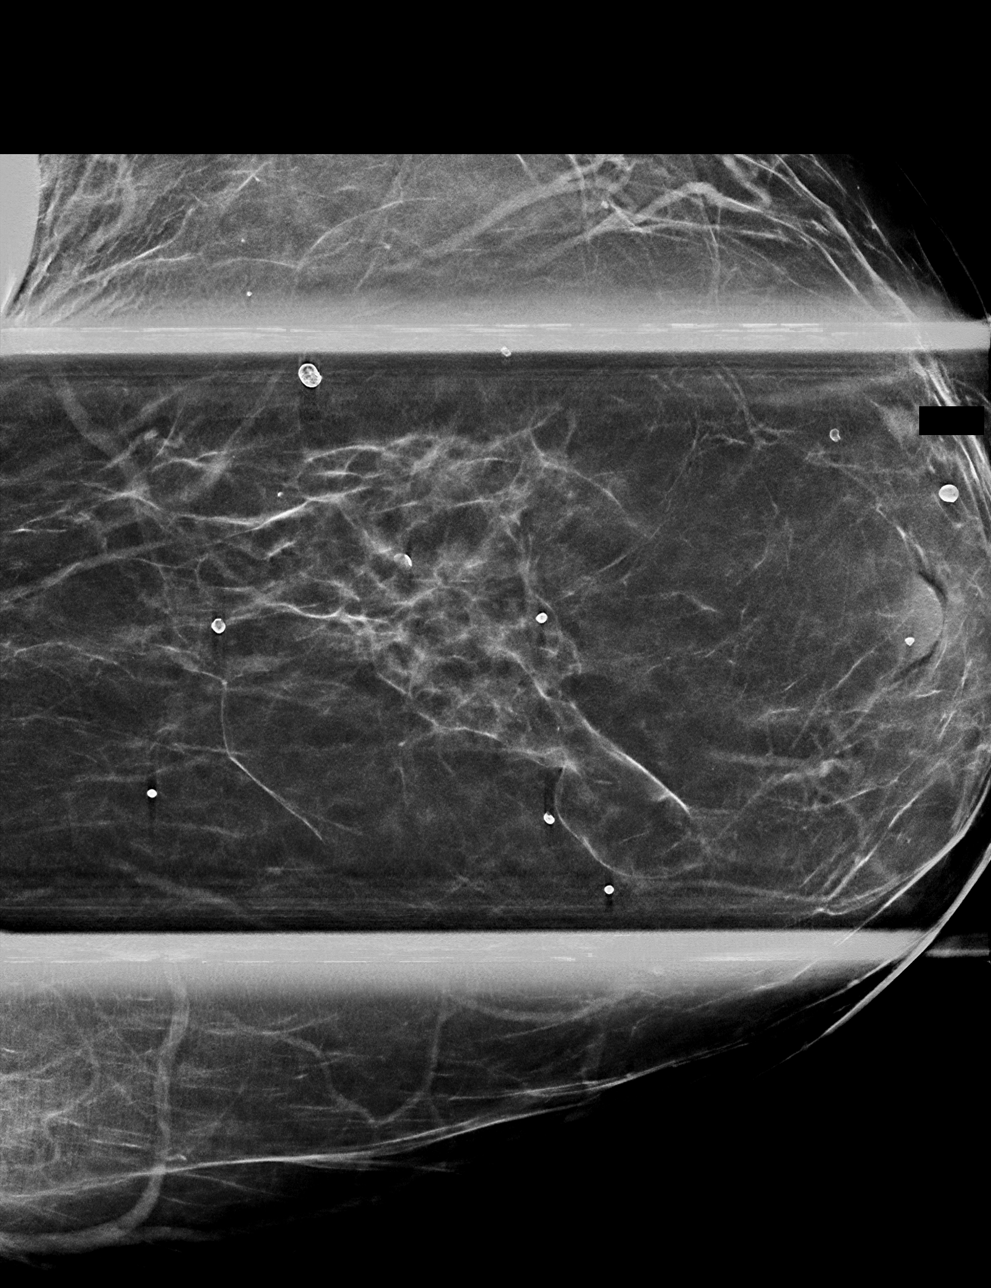

[L MLO synth-2D]
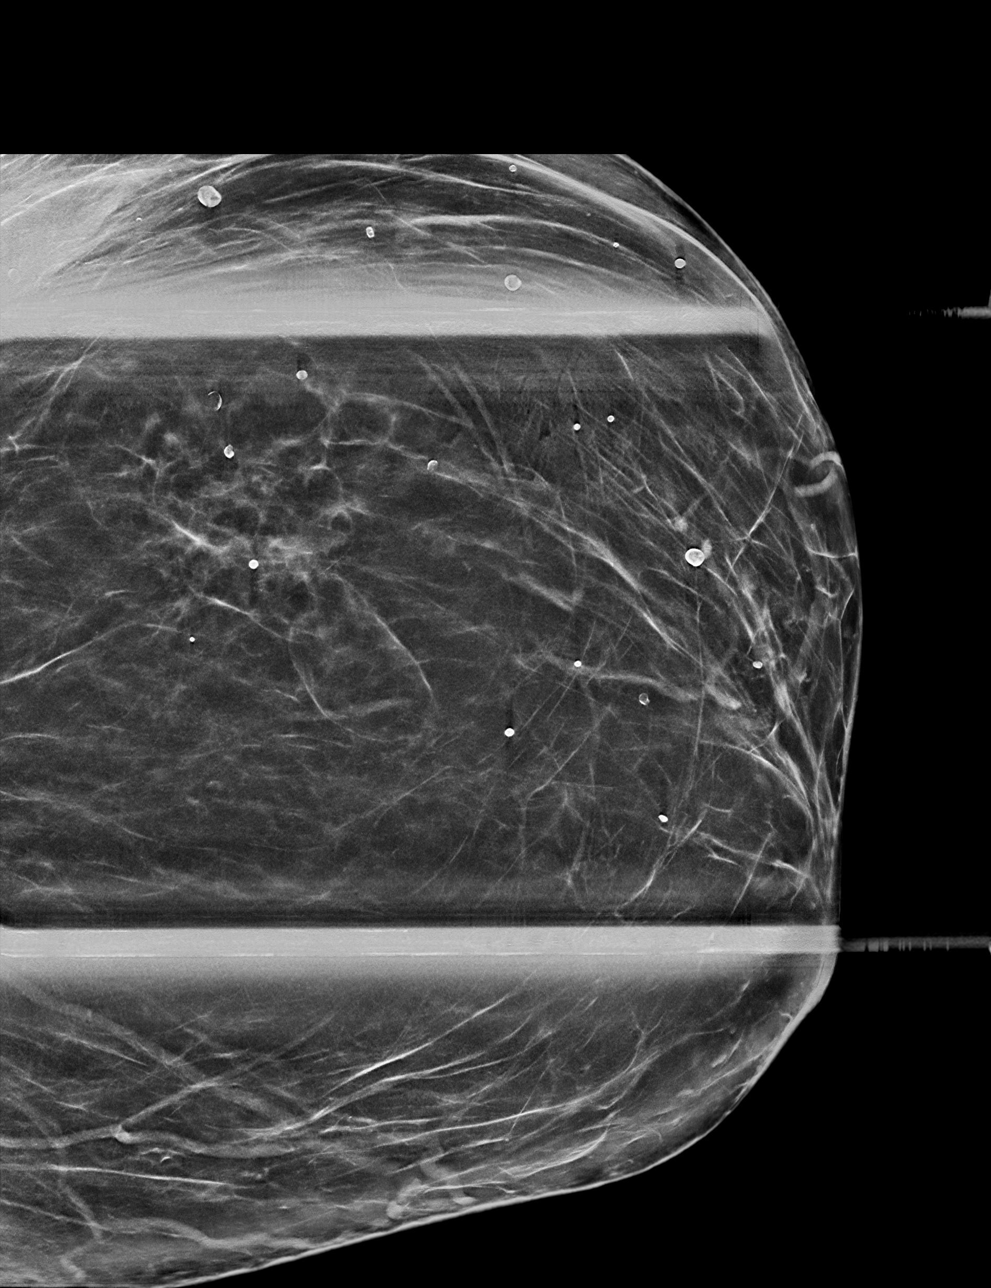

[L CC tomo · tomo slice 33/64.0]
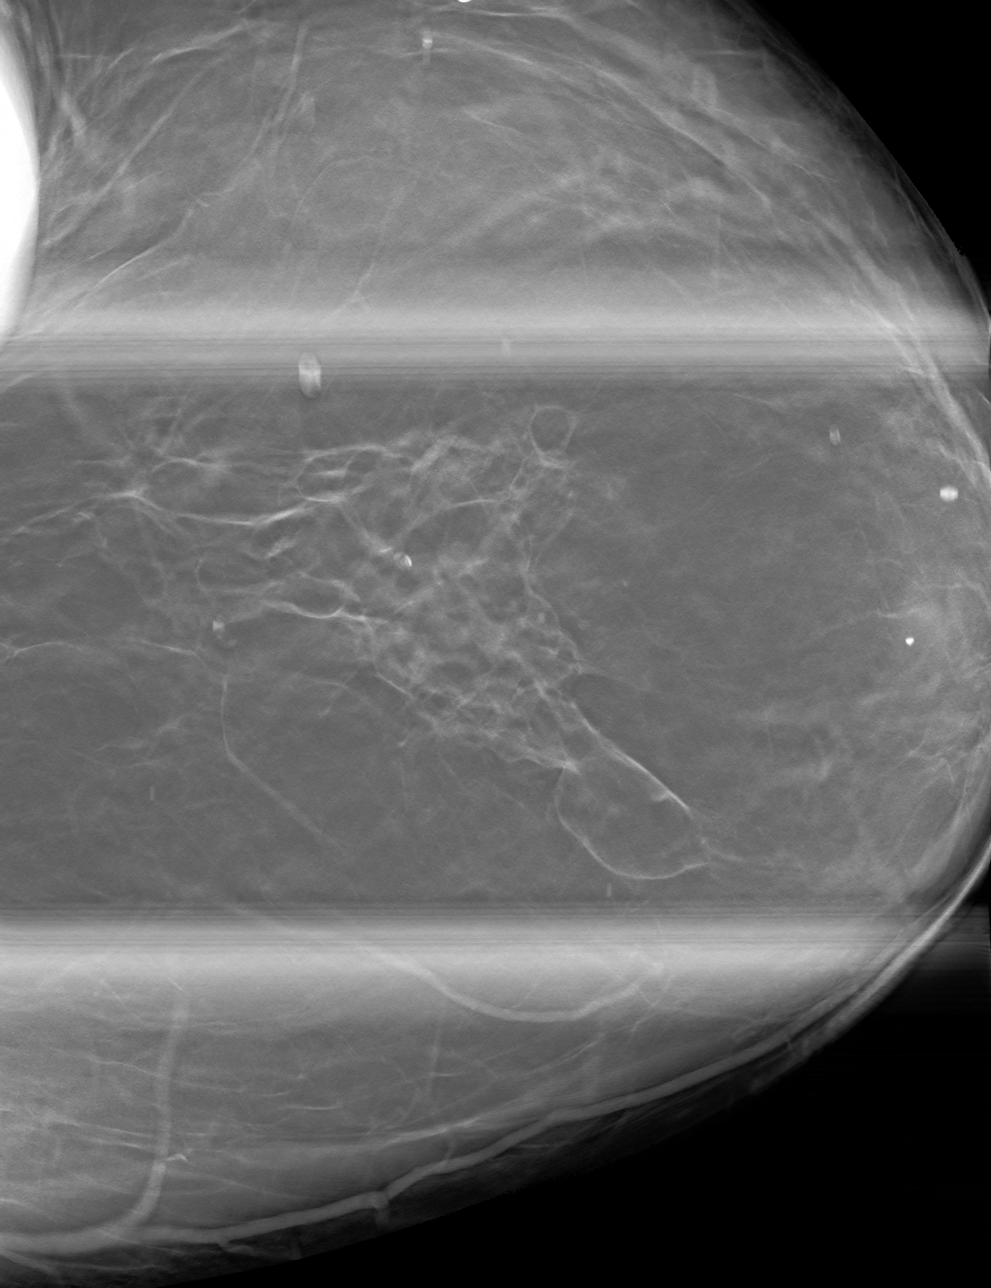

[L MLO tomo · tomo slice 38/75.0]
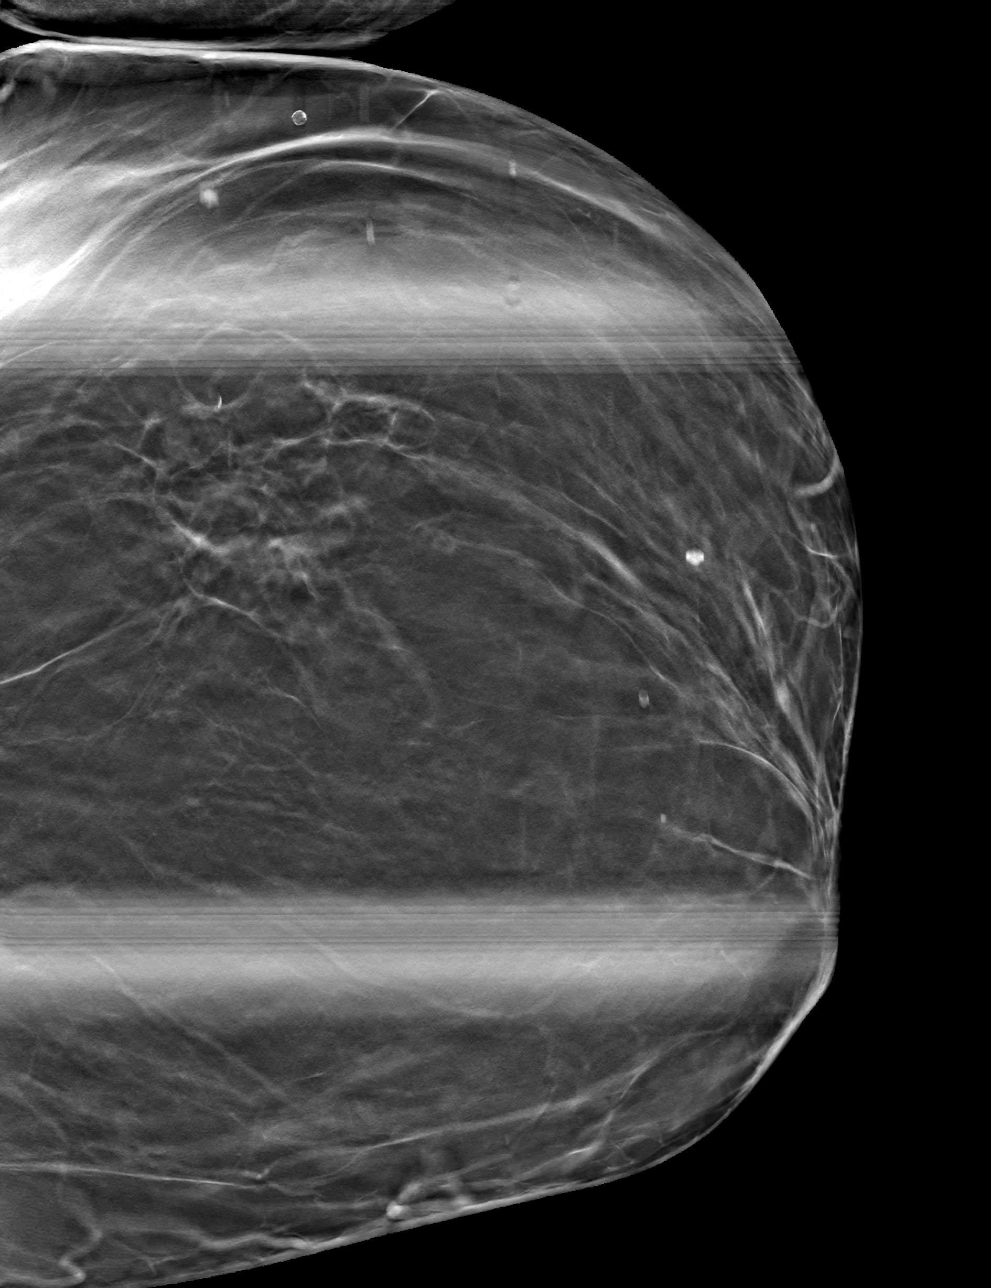

[4 of 12 positions shown; findings below may reference images not displayed]

ACR Breast Density Category b: There are scattered areas of
fibroglandular density.
FINDINGS: Spot compression CC and MLO views of the left breast are submitted.
The previously questioned asymmetry in the central posterior left
breast persists on additional views.

Targeted ultrasound is performed, showing heterogeneous lobulated
hypoechoic areas at the left breast 12 o'clock 14 cm from nipple
measuring maximum 8.6 cm. Ultrasound of the left axilla is negative.
IMPRESSION: Suspicious findings.

RECOMMENDATION:
Ultrasound-guided core biopsy of heterogeneous lobulated hypoechoic
areas at the right breast 12 o'clock.

I have discussed the findings and recommendations with the patient.
Results were also provided in writing at the conclusion of the
visit. If applicable, a reminder letter will be sent to the patient
regarding the next appointment.

BI-RADS CATEGORY  4: Suspicious.

ADDENDUM:
Rid recommendation should stay ultrasound-guided core biopsy of
heterogeneous lobulated hypoechoic area at the left breast 12
o'clock.

*** End of Addendum ***
ACR Breast Density Category b: There are scattered areas of
fibroglandular density.
FINDINGS: Spot compression CC and MLO views of the left breast are submitted.
The previously questioned asymmetry in the central posterior left
breast persists on additional views.

Targeted ultrasound is performed, showing heterogeneous lobulated
hypoechoic areas at the left breast 12 o'clock 14 cm from nipple
measuring maximum 8.6 cm. Ultrasound of the left axilla is negative.
IMPRESSION: Suspicious findings.

RECOMMENDATION:
Ultrasound-guided core biopsy of heterogeneous lobulated hypoechoic
areas at the right breast 12 o'clock.

I have discussed the findings and recommendations with the patient.
Results were also provided in writing at the conclusion of the
visit. If applicable, a reminder letter will be sent to the patient
regarding the next appointment.

BI-RADS CATEGORY  4: Suspicious.

## 2020-03-27 ENCOUNTER — Other Ambulatory Visit: Payer: Medicaid Other

## 2020-03-27 ENCOUNTER — Other Ambulatory Visit: Payer: Self-pay

## 2020-03-27 DIAGNOSIS — N179 Acute kidney failure, unspecified: Secondary | ICD-10-CM

## 2020-03-28 ENCOUNTER — Telehealth: Payer: Self-pay | Admitting: Family Medicine

## 2020-03-28 LAB — RENAL FUNCTION PANEL
Albumin: 4.1 g/dL (ref 3.8–4.8)
BUN/Creatinine Ratio: 16 (ref 12–28)
BUN: 23 mg/dL (ref 8–27)
CO2: 18 mmol/L — ABNORMAL LOW (ref 20–29)
Calcium: 9.3 mg/dL (ref 8.7–10.3)
Chloride: 104 mmol/L (ref 96–106)
Creatinine, Ser: 1.45 mg/dL — ABNORMAL HIGH (ref 0.57–1.00)
GFR calc Af Amer: 44 mL/min/{1.73_m2} — ABNORMAL LOW
GFR calc non Af Amer: 38 mL/min/{1.73_m2} — ABNORMAL LOW
Glucose: 184 mg/dL — ABNORMAL HIGH (ref 65–99)
Phosphorus: 3.3 mg/dL (ref 3.0–4.3)
Potassium: 5.4 mmol/L — ABNORMAL HIGH (ref 3.5–5.2)
Sodium: 138 mmol/L (ref 134–144)

## 2020-03-28 MED ORDER — INDAPAMIDE 2.5 MG PO TABS: 2.5000 mg | ORAL_TABLET | Freq: Every day | ORAL | 3 refills | Status: DC

## 2020-03-28 NOTE — Telephone Encounter (Signed)
Called with results--K mildly at 5.4, restart indapamide given improvement in creatinine. Recommended repeat this week or next week, she reports she is very busy---scheduled at earliest on convenience.   All questions answered, reviewed strict return precautions.  [name], MD  Family Medicine Teaching Service

## 2020-04-01 ENCOUNTER — Telehealth: Payer: Self-pay | Admitting: Family Medicine

## 2020-04-02 ENCOUNTER — Other Ambulatory Visit: Payer: Self-pay

## 2020-04-02 DIAGNOSIS — L02211 Cutaneous abscess of abdominal wall: Secondary | ICD-10-CM

## 2020-04-02 MED ORDER — MUPIROCIN 2 % EX OINT
TOPICAL_OINTMENT | CUTANEOUS | 0 refills | Status: DC
Start: 2020-04-02 — End: 2020-04-08

## 2020-04-05 NOTE — Telephone Encounter (Signed)
Scheduled patient for next available ADV HTN follow up

## 2020-04-08 ENCOUNTER — Other Ambulatory Visit: Payer: Self-pay | Admitting: Family Medicine

## 2020-04-08 ENCOUNTER — Other Ambulatory Visit: Payer: Medicaid Other

## 2020-04-08 ENCOUNTER — Ambulatory Visit (INDEPENDENT_AMBULATORY_CARE_PROVIDER_SITE_OTHER): Payer: Medicaid Other

## 2020-04-08 ENCOUNTER — Other Ambulatory Visit: Payer: Self-pay

## 2020-04-08 DIAGNOSIS — L02211 Cutaneous abscess of abdominal wall: Secondary | ICD-10-CM

## 2020-04-08 DIAGNOSIS — I10 Essential (primary) hypertension: Secondary | ICD-10-CM

## 2020-04-08 DIAGNOSIS — Z23 Encounter for immunization: Secondary | ICD-10-CM

## 2020-04-08 MED ORDER — MUPIROCIN 2 % EX OINT: TOPICAL_OINTMENT | CUTANEOUS | 2 refills | Status: DC

## 2020-04-08 NOTE — Progress Notes (Signed)
   Covid-19 Vaccination Clinic  Name:  Tammy [name]    MRN: [id number] DOB: 10-19-1955  04/08/2020  [name] was observed post Covid-19 immunization for 15 minutes without incident. She was provided with Vaccine Information Sheet and instruction to access the V-Safe system.   [name] was instructed to call 911 with any severe reactions post vaccine: Marland Kitchen Difficulty breathing  . Swelling of face and throat  . A fast heartbeat  . A bad rash all over body  . Dizziness and weakness   Booster administered RD without complication.

## 2020-04-09 ENCOUNTER — Telehealth: Payer: Self-pay | Admitting: Family Medicine

## 2020-04-09 LAB — BASIC METABOLIC PANEL
BUN/Creatinine Ratio: 16 (ref 12–28)
BUN: 22 mg/dL (ref 8–27)
CO2: 22 mmol/L (ref 20–29)
Calcium: 9.3 mg/dL (ref 8.7–10.3)
Chloride: 106 mmol/L (ref 96–106)
Creatinine, Ser: 1.41 mg/dL — ABNORMAL HIGH (ref 0.57–1.00)
GFR calc Af Amer: 46 mL/min/{1.73_m2} — ABNORMAL LOW (ref >59)
GFR calc non Af Amer: 40 mL/min/{1.73_m2} — ABNORMAL LOW (ref >59)
Glucose: 149 mg/dL — ABNORMAL HIGH (ref 65–99)
Potassium: 4.7 mmol/L (ref 3.5–5.2)
Sodium: 142 mmol/L (ref 134–144)

## 2020-04-09 MED ORDER — CYCLOBENZAPRINE HCL 5 MG PO TABS: 5.0000 mg | ORAL_TABLET | Freq: Three times a day (TID) | ORAL | 6 refills | Status: DC | PRN

## 2020-04-09 NOTE — Telephone Encounter (Signed)
Called patient with results.  Creatinine is improved but not back to baseline.  Discussed again avoiding NSAIDs.  At this time we will hold off restarting her losartan till she is seen in follow-up.  I suspect she may need to be seen by nephrology in the near term.  She has had some degree of renal dysfunction dating back to 2012, this may be her new baseline.  Repeat BMP and renal function at follow-up.  Potassium is improved.

## 2020-04-12 ENCOUNTER — Telehealth: Payer: Self-pay | Admitting: Family Medicine

## 2020-04-12 ENCOUNTER — Telehealth: Payer: Self-pay

## 2020-04-12 DIAGNOSIS — B3731 Acute candidiasis of vulva and vagina: Secondary | ICD-10-CM

## 2020-04-12 DIAGNOSIS — B373 Candidiasis of vulva and vagina: Secondary | ICD-10-CM

## 2020-04-12 NOTE — Telephone Encounter (Signed)
Patient calls nurse line stating [facility] called her in Doxycycline for her breasts today. Patient reports she was told this medication may cause a yeast infection and to ask PCP for Diflucan. Patient is very concerned about this and wants to go ahead and have the medication on hand. Please send Diflucan to [facility] on [address]. Will forward to PCP.

## 2020-04-12 NOTE — Telephone Encounter (Signed)
Called patient about concerns, all questions answered, no symptoms at this time. No Rx sent. She will call if this changes.   [name], MD  Family Medicine Teaching Service

## 2020-05-06 ENCOUNTER — Ambulatory Visit: Payer: Medicaid Other

## 2020-05-17 ENCOUNTER — Encounter: Payer: Self-pay | Admitting: Family Medicine

## 2020-05-17 ENCOUNTER — Other Ambulatory Visit: Payer: Self-pay

## 2020-05-17 ENCOUNTER — Ambulatory Visit (INDEPENDENT_AMBULATORY_CARE_PROVIDER_SITE_OTHER): Payer: Medicaid Other | Admitting: Family Medicine

## 2020-05-17 VITALS — BP 150/90 | HR 56 | Resp 14 | Wt 279.0 lb

## 2020-05-17 DIAGNOSIS — I10 Essential (primary) hypertension: Secondary | ICD-10-CM

## 2020-05-17 DIAGNOSIS — N1831 Chronic kidney disease, stage 3a: Secondary | ICD-10-CM | POA: Diagnosis not present

## 2020-05-17 DIAGNOSIS — F17200 Nicotine dependence, unspecified, uncomplicated: Secondary | ICD-10-CM | POA: Diagnosis not present

## 2020-05-17 DIAGNOSIS — N183 Chronic kidney disease, stage 3 unspecified: Secondary | ICD-10-CM | POA: Insufficient documentation

## 2020-05-17 NOTE — Assessment & Plan Note (Signed)
In reviewing patient's recent glomerular filtration rates it does appear that since about 2016 she has had some component of chronic kidney disease.  Ordered renal ultrasound.  Number provided for patient to schedule.  Urine protein to creatinine ratio today.  Repeat BMP at follow-up recommend the remainder of CKD labs.

## 2020-05-17 NOTE — Patient Instructions (Addendum)
It was wonderful to see you today.  Please bring ALL of your medications with you to every visit.   Today we talked about:   Call me if you develop white, thick, itchy vaginal discharge  For your blood pressure:  - We will check your potassium today -- If it looks good we will start losartan 25 mg  -- Please call to schedule your kidney ultrasound     Thank you for choosing [facility].   Please call [phone] with any questions about today's appointment.  Please be sure to schedule follow up at the front  desk before you leave today.   [name], MD  Family Medicine

## 2020-05-17 NOTE — Progress Notes (Signed)
° ° °  SUBJECTIVE:   CHIEF COMPLAINT / HPI:   Tammy [name] is a very pleasant 65 year old woman with history significant for tobacco abuse with a 30-pack-year history, hypertension obesity and recurrent abscesses consistent with hidradenitis suppurativa presenting today for follow-up.  She is recently been seen by dermatology.  They started her on doxycycline.  They recommended her to take Diflucan although did not tell her for what.  She denies vaginal discharge itching or pruritus today.  Reviewed the signs and symptoms of a yeast infection.   The patient reports adherence to her medications.  She denies dizziness, chest pain, difficulty breathing.  The patient currently smokes 1/2 pack of cigarettes a day.  She typically smokes after meals.  She is thinking about getting a low-dose CT.  She denies signs or symptoms of lung cancer including hemoptysis, fever weight loss or cough.  PHQ 9 was 0.  PERTINENT  PMH / PSH/Family/Social History : Updated and reviewed as appropriate  OBJECTIVE:   BP (!) 150/90    Pulse (!) 56    Resp 14    Wt 279 lb (126.6 kg)    SpO2 96%    BMI 45.03 kg/m   Today's weight:  Last Weight  Most recent update: 05/17/2020  2:44 PM   Weight  126.6 kg (279 lb)           Review of prior weights: Filed Weights   05/17/20 1444  Weight: 279 lb (126.6 kg)     Cardiac: Regular rate and rhythm. Normal S1/S2. No murmurs, rubs, or gallops appreciated. Lungs: Clear bilaterally to ascultation.  Abdomen: Normoactive bowel sounds. No tenderness to deep or light palpation. No rebound or guarding.   Skin healing lesion on L anterior chest wall   Psych: Pleasant and appropriate     ASSESSMENT/PLAN:   HYPERTENSION, BENIGN SYSTEMIC Recently held losartan in light of AKI.  Repeat BMP today if potassium appropriate will restart losartan.  TOBACCO DEPENDENCE Patient typically uses cigarettes after meals.  She smokes [place] menthol.  We discussed reasons to quit  smoking.  Encourage cessation we will continue to address---precontemplative at this time  Morbid obesity Encouraged thinking about food is fuel and positive body talk rather than viewing media resources that promote excessive dietary restriction or fad diets.  Stage 3a chronic kidney disease (HCC) In reviewing patient's recent glomerular filtration rates it does appear that since about 2016 she has had some component of chronic kidney disease.  Ordered renal ultrasound.  Number provided for patient to schedule.  Urine protein to creatinine ratio today.  Repeat BMP at follow-up recommend the remainder of CKD labs.   HCM Discussed low-dose CT with the patient.  We reviewed options.  Handout given she will read this and call me with questions which I suspect she will have.    [name], MD  Family Medicine Teaching Service  [facility]

## 2020-05-17 NOTE — Assessment & Plan Note (Signed)
Recently held losartan in light of AKI.  Repeat BMP today if potassium appropriate will restart losartan.

## 2020-05-17 NOTE — Assessment & Plan Note (Signed)
Patient typically uses cigarettes after meals.  She smokes [place] menthol.  We discussed reasons to quit smoking.  Encourage cessation we will continue to address---precontemplative at this time

## 2020-05-17 NOTE — Assessment & Plan Note (Signed)
Encouraged thinking about food is fuel and positive body talk rather than viewing media resources that promote excessive dietary restriction or fad diets.

## 2020-05-18 LAB — PROTEIN / CREATININE RATIO, URINE
Creatinine, Urine: 352.5 mg/dL
Protein, Ur: 19.7 mg/dL
Protein/Creat Ratio: 56 mg/g creat (ref 0–200)

## 2020-05-18 LAB — BASIC METABOLIC PANEL
BUN/Creatinine Ratio: 13 (ref 12–28)
BUN: 18 mg/dL (ref 8–27)
CO2: 19 mmol/L — ABNORMAL LOW (ref 20–29)
Calcium: 9 mg/dL (ref 8.7–10.3)
Chloride: 105 mmol/L (ref 96–106)
Creatinine, Ser: 1.4 mg/dL — ABNORMAL HIGH (ref 0.57–1.00)
Glucose: 127 mg/dL — ABNORMAL HIGH (ref 65–99)
Potassium: 4.3 mmol/L (ref 3.5–5.2)
Sodium: 142 mmol/L (ref 134–144)
eGFR: 42 mL/min/{1.73_m2} — ABNORMAL LOW (ref >59)

## 2020-05-20 ENCOUNTER — Encounter: Payer: Self-pay | Admitting: Family Medicine

## 2020-05-20 ENCOUNTER — Ambulatory Visit: Payer: Medicaid Other | Admitting: Cardiovascular Disease

## 2020-05-20 ENCOUNTER — Other Ambulatory Visit: Payer: Self-pay | Admitting: Family Medicine

## 2020-05-20 DIAGNOSIS — I10 Essential (primary) hypertension: Secondary | ICD-10-CM

## 2020-05-20 NOTE — Telephone Encounter (Signed)
Attempted to call with results  As kidney function near baseline, can restart ARB. RX pended, has lab visit Friday. Will try again PM on 3/22.  [name], MD  Family Medicine Teaching Service

## 2020-05-21 MED ORDER — LOSARTAN POTASSIUM 25 MG PO TABS: 25.0000 mg | ORAL_TABLET | Freq: Every day | ORAL | 3 refills | Status: DC

## 2020-05-21 NOTE — Telephone Encounter (Signed)
Called and discussed adding ARB. Repeat BMP Friday given K. All questions answered.   [name], MD  Family Medicine Teaching Service

## 2020-05-24 ENCOUNTER — Other Ambulatory Visit: Payer: Self-pay | Admitting: Family Medicine

## 2020-05-24 ENCOUNTER — Other Ambulatory Visit: Payer: Medicaid Other

## 2020-05-24 ENCOUNTER — Other Ambulatory Visit: Payer: Self-pay

## 2020-05-24 DIAGNOSIS — I10 Essential (primary) hypertension: Secondary | ICD-10-CM | POA: Diagnosis not present

## 2020-05-25 ENCOUNTER — Other Ambulatory Visit: Payer: Self-pay | Admitting: Family Medicine

## 2020-05-25 ENCOUNTER — Telehealth: Payer: Self-pay | Admitting: Family Medicine

## 2020-05-25 DIAGNOSIS — I10 Essential (primary) hypertension: Secondary | ICD-10-CM

## 2020-05-25 LAB — BASIC METABOLIC PANEL
BUN/Creatinine Ratio: 11 — ABNORMAL LOW (ref 12–28)
BUN: 18 mg/dL (ref 8–27)
CO2: 19 mmol/L — ABNORMAL LOW (ref 20–29)
Calcium: 9 mg/dL (ref 8.7–10.3)
Chloride: 104 mmol/L (ref 96–106)
Creatinine, Ser: 1.64 mg/dL — ABNORMAL HIGH (ref 0.57–1.00)
Glucose: 100 mg/dL — ABNORMAL HIGH (ref 65–99)
Potassium: 4.3 mmol/L (ref 3.5–5.2)
Sodium: 137 mmol/L (ref 134–144)
eGFR: 35 mL/min/{1.73_m2} — ABNORMAL LOW (ref >59)

## 2020-05-25 NOTE — Telephone Encounter (Signed)
Called with results.  Patient has not been taking her losartan as prescribed.  She is going to pick it up today.  Appears her kidney function is now near baseline.  I suspect her new GFR ranges from 30-40.  We will start low-dose ARB repeat on Friday this is scheduled and ordered.

## 2020-05-31 ENCOUNTER — Ambulatory Visit: Payer: Medicaid Other

## 2020-06-07 ENCOUNTER — Other Ambulatory Visit: Payer: Self-pay

## 2020-06-07 ENCOUNTER — Other Ambulatory Visit: Payer: Medicaid Other

## 2020-06-07 ENCOUNTER — Other Ambulatory Visit: Payer: Self-pay | Admitting: Family Medicine

## 2020-06-07 DIAGNOSIS — I2510 Atherosclerotic heart disease of native coronary artery without angina pectoris: Secondary | ICD-10-CM | POA: Diagnosis not present

## 2020-06-07 DIAGNOSIS — N1831 Chronic kidney disease, stage 3a: Secondary | ICD-10-CM

## 2020-06-07 DIAGNOSIS — I10 Essential (primary) hypertension: Secondary | ICD-10-CM

## 2020-06-08 ENCOUNTER — Telehealth: Payer: Self-pay | Admitting: Family Medicine

## 2020-06-08 ENCOUNTER — Encounter: Payer: Self-pay | Admitting: Family Medicine

## 2020-06-08 LAB — BASIC METABOLIC PANEL
BUN/Creatinine Ratio: 13 (ref 12–28)
BUN: 20 mg/dL (ref 8–27)
CO2: 19 mmol/L — ABNORMAL LOW (ref 20–29)
Calcium: 9.5 mg/dL (ref 8.7–10.3)
Chloride: 101 mmol/L (ref 96–106)
Creatinine, Ser: 1.56 mg/dL — ABNORMAL HIGH (ref 0.57–1.00)
Glucose: 143 mg/dL — ABNORMAL HIGH (ref 65–99)
Potassium: 4.1 mmol/L (ref 3.5–5.2)
Sodium: 138 mmol/L (ref 134–144)
eGFR: 37 mL/min/{1.73_m2} — ABNORMAL LOW (ref >59)

## 2020-06-08 LAB — CBC
Hematocrit: 38.7 % (ref 34.0–46.6)
Hemoglobin: 12.4 g/dL (ref 11.1–15.9)
MCH: 27.6 pg (ref 26.6–33.0)
MCHC: 32 g/dL (ref 31.5–35.7)
MCV: 86 fL (ref 79–97)
Platelets: 243 10*3/uL (ref 150–450)
RBC: 4.49 x10E6/uL (ref 3.77–5.28)
RDW: 12.5 % (ref 11.7–15.4)
WBC: 4.8 10*3/uL (ref 3.4–10.8)

## 2020-06-08 NOTE — Telephone Encounter (Signed)
Called patient with results.  Overall, results look good. CBC (normal, no anemia). BMP- creatinine at baseline, renal ultrasound will need schedule. Suggest Urine protein/creatinine at follow up, will discuss nephrology referral.  Left generic voicemail. Will send letter.   [name], MD  Family Medicine Teaching Service

## 2020-06-10 ENCOUNTER — Other Ambulatory Visit: Payer: Self-pay

## 2020-06-10 ENCOUNTER — Ambulatory Visit (INDEPENDENT_AMBULATORY_CARE_PROVIDER_SITE_OTHER): Payer: Medicaid Other | Admitting: Family Medicine

## 2020-06-10 ENCOUNTER — Encounter: Payer: Self-pay | Admitting: Family Medicine

## 2020-06-10 VITALS — BP 145/60 | HR 50 | Wt 280.4 lb

## 2020-06-10 DIAGNOSIS — I10 Essential (primary) hypertension: Secondary | ICD-10-CM | POA: Diagnosis not present

## 2020-06-10 DIAGNOSIS — E782 Mixed hyperlipidemia: Secondary | ICD-10-CM

## 2020-06-10 DIAGNOSIS — G8929 Other chronic pain: Secondary | ICD-10-CM | POA: Diagnosis not present

## 2020-06-10 DIAGNOSIS — M545 Low back pain, unspecified: Secondary | ICD-10-CM

## 2020-06-10 DIAGNOSIS — N1832 Chronic kidney disease, stage 3b: Secondary | ICD-10-CM

## 2020-06-10 DIAGNOSIS — R7301 Impaired fasting glucose: Secondary | ICD-10-CM

## 2020-06-10 DIAGNOSIS — F17200 Nicotine dependence, unspecified, uncomplicated: Secondary | ICD-10-CM | POA: Diagnosis not present

## 2020-06-10 LAB — POCT GLYCOSYLATED HEMOGLOBIN (HGB A1C): Hemoglobin A1C: 5.2 % (ref 4.0–5.6)

## 2020-06-10 NOTE — Assessment & Plan Note (Signed)
Discussed at length - Urine protein/creatinine normal - Renal ultrasound ordered and scheduled - Referral to Nephrology -Spent time discussing likely etiology--most likely HTN

## 2020-06-10 NOTE — Assessment & Plan Note (Signed)
Consider increasing to 20 mg at follow up

## 2020-06-10 NOTE — Assessment & Plan Note (Addendum)
Improved--- recent BMP at baseline. Heart rate on lower side today, no side effects - Consider increasing ARB at follow up - Discussed low salt/ low K spices (has had high K in past)

## 2020-06-10 NOTE — Assessment & Plan Note (Signed)
Reducing amount slowly, continue to discuss. Discuss low dose CT again at follow up

## 2020-06-10 NOTE — Patient Instructions (Addendum)
It was wonderful to see you today.  Please bring ALL of your medications with you to every visit.   Today we talked about:  1. You do NOT need dialysis  2. You should AVOID aleve, advil, and motrin--you can take Tylenol for pain  3. You will have your kidney ultrasound on the  Date below  On the same day, please get your spine x-rayed   The spine film does not require an appointment   4. Spices generally have no calories--I do recommend limiting salt please do NOT use a salt substitute--these contain potassium    5. You should be called by the kidney doctor   CONGRATULATIONS--you do NOT have diabetes    Thank you for choosing [facility].   Please call [phone] with any questions about today's appointment.  Please be sure to schedule follow up at the front  desk before you leave today.   [name], MD  Family Medicine

## 2020-06-10 NOTE — Progress Notes (Signed)
SUBJECTIVE:   CHIEF COMPLAINT / HPI:   Tammy [name] is a pleasant 65 yo woman with history significant for hypertension, chronic kidney disease stage III, obesity and tobacco abuse presenting today for routine follow-up.  She has a number of concerns today.  The patient reports she is worried about her kidney function.  She is noted has progressively increased over the past several years.  She reports a history of NSAID use but is stopped after we discussed this several visits ago.  She reports she has a normal urinary stream.  She denies problems with urinary retention or difficulty going to bathroom.  She has no personal history of diabetes.  There is a strong family history of diabetes and chronic kidney disease.  The patient reports she would like to lose weight.  She reports despite her best intentions she has continued to gain weight.  She reports she is using a salt substitute which has a significant amount of potassium in it.  She is not particularly interested in an injectable, a pill or weight loss surgery.  She is interested in the keto diet.  She is previously talked to nutritionist but would like to explore this by herself at this time.  The patient reports compliance with her antihypertensive medications.  We discussed her marked improvement in blood pressure control today.  She is very worried that these medications may be affecting her kidneys.  We discussed this.  The patient reports ongoing low back pain.  This has been ongoing for several months now.  She denies radiation down her lower extremities, bowel or bladder incontinence, saddle anesthesia.  She intermittently uses Flexeril about 1-2 times per week with relief.  She is also using Tylenol.  As above she is not using NSAIDs.  PERTINENT  PMH / PSH/Family/Social History : updated and updated Fhx   OBJECTIVE:   BP (!) 145/60   Pulse (!) 50   Wt 280 lb 6.4 oz (127.2 kg)   SpO2 100%   BMI 45.26 kg/m   Today's  weight:  Last Weight  Most recent update: 06/10/2020 10:36 AM   Weight  127.2 kg (280 lb 6.4 oz)           Review of prior weights: Filed Weights   06/10/20 1036  Weight: 280 lb 6.4 oz (127.2 kg)    Cardiac: Regular rate and rhythm. Normal S1/S2. No murmurs, rubs, or gallops appreciated. Lungs: Coarse bilateral breath sounds  Psych: Pleasant and appropriate   Prior renal ultrasound  Findings: Right kidney measures 10.5 cm and left kidney, 9.8 cm.  Parenchymal echo texture is uniform. No mass, stone or  hydronephrosis. Bladder unremarkable.    IMPRESSION:  No acute findings.     ASSESSMENT/PLAN:   HYPERTENSION, BENIGN SYSTEMIC Improved--- recent BMP at baseline. Heart rate on lower side today, no side effects - Consider increasing ARB at follow up - Discussed low salt/ low K spices (has had high K in past)    TOBACCO DEPENDENCE Reducing amount slowly, continue to discuss. Discuss low dose CT again at follow up   Chronic kidney disease, stage III (moderate) (HCC) Discussed at length - Urine protein/creatinine normal - Renal ultrasound ordered and scheduled - Referral to Nephrology -Spent time discussing likely etiology--most likely HTN   HLD (hyperlipidemia) Consider increasing to 20 mg at follow up    Elevated blood glucose-- A1C appropriate. Discussed dietary changes. Discussed that cutting out entire food groups may not be optimal ---will continue to  discuss   Chronic low back- likely degenerative disc disease or paraspinal muscle spasm, no red flags. Also considered other causes--less likely nephrolithiasis--- xray ordered--discussed pain control    Severe Obesity- discussed, will continue to address. Discussed other spices to use.   HCM Discussed tobacco abuse- discuss low dose CT at follow up, increase statin, increase ARB at follow up     [name], MD  Family Medicine Teaching Service  [facility]

## 2020-06-11 ENCOUNTER — Telehealth: Payer: Self-pay | Admitting: Family Medicine

## 2020-06-11 DIAGNOSIS — N1832 Chronic kidney disease, stage 3b: Secondary | ICD-10-CM

## 2020-06-11 NOTE — Telephone Encounter (Signed)
Called and discussed transport to Nephrology--patient would like daughter to take her.  [name], MD  Family Medicine Teaching Service

## 2020-06-11 NOTE — Telephone Encounter (Signed)
Attempted to call [facility] about transportation. Left generic voicemail to call back.  If calls back, please let patient know a care guide should be reaching out getting her to Nephrology---Medicaid should be able to take her if [facility] cannot (I think she has both set up).  [name], MD  Family Medicine Teaching Service

## 2020-06-11 NOTE — Telephone Encounter (Signed)
Attempted to call again. Will send letter if needed.  [name], MD  Family Medicine Teaching Service

## 2020-06-12 ENCOUNTER — Telehealth: Payer: Self-pay | Admitting: Family Medicine

## 2020-06-12 NOTE — Telephone Encounter (Signed)
   Telephone encounter was:  Successful.  06/12/2020 Name: Tammy [name] MRN: [id number] DOB: 1955/06/22  Tammy [name] is a 65 y.o. year old female who is a primary care patient of Westley Chandler, MD . The community resource team was consulted for assistance with Transportation Needs   Care guide performed the following interventions: Patient provided with information about care guide support team and interviewed to confirm resource needs Discussed resources to assist with transportation and she would like me to mail her the forms for [employer] Part B and the TAMS form to her. Cannot schedule transportation for the upcoming Kidney doctor until she has an actual appointment..  Follow Up Plan:  Care guide will follow up with patient by phone over the next week.   Rojelio Brenner Care Guide, Embedded Care Coordination [facility], Care Management Phone: [phone] Email: [email or web address]

## 2020-06-17 ENCOUNTER — Ambulatory Visit (HOSPITAL_COMMUNITY)
Admission: RE | Admit: 2020-06-17 | Discharge: 2020-06-17 | Disposition: A | Discharge status: Home / Self Care | Payer: Medicaid Other | Source: Ambulatory Visit | Attending: Family Medicine | Admitting: Family Medicine

## 2020-06-17 ENCOUNTER — Other Ambulatory Visit: Payer: Self-pay

## 2020-06-17 DIAGNOSIS — N189 Chronic kidney disease, unspecified: Secondary | ICD-10-CM | POA: Diagnosis not present

## 2020-06-17 DIAGNOSIS — G8929 Other chronic pain: Secondary | ICD-10-CM | POA: Insufficient documentation

## 2020-06-17 DIAGNOSIS — N1832 Chronic kidney disease, stage 3b: Secondary | ICD-10-CM | POA: Insufficient documentation

## 2020-06-17 DIAGNOSIS — N281 Cyst of kidney, acquired: Secondary | ICD-10-CM | POA: Diagnosis not present

## 2020-06-17 DIAGNOSIS — M545 Low back pain, unspecified: Secondary | ICD-10-CM | POA: Insufficient documentation

## 2020-06-18 ENCOUNTER — Telehealth: Payer: Self-pay | Admitting: Family Medicine

## 2020-06-18 NOTE — Telephone Encounter (Signed)
   Telephone encounter was:  Successful.  06/18/2020 Name: RONNI OSTERBERG MRN: [id number] DOB: [birth year]  KAIESHA TONNER is a 65 y.o. year old female who is a primary care patient of Westley Chandler, MD . The community resource team was consulted for assistance with Transportation Needs   Care guide performed the following interventions: Follow up call placed to the patient to discuss status of referral.  Follow Up Plan:  No further follow up planned at this time. The patient has been provided with needed resources.  Rojelio Brenner Care Guide, Embedded Care Coordination [facility], Care Management Phone: [phone] Email: [email or web address]

## 2020-06-19 ENCOUNTER — Telehealth: Payer: Self-pay | Admitting: Family Medicine

## 2020-06-19 NOTE — Telephone Encounter (Signed)
Called with X-ray results. All questions answered.  [name], MD  Family Medicine Teaching Service

## 2020-06-24 ENCOUNTER — Telehealth: Payer: Self-pay | Admitting: Family Medicine

## 2020-06-24 NOTE — Telephone Encounter (Signed)
   Telephone encounter was:  Successful.  06/24/2020 Name: Tammy [name] MRN: [id number] DOB: November 16, 1955  [name] is a 65 y.o. year old female who is a primary care patient of Westley Chandler, MD . The community resource team was consulted for assistance with Transportation Needs   Care guide performed the following interventions: patient called me to ask her how to help her fill out the transportation paperwork for the [employer] for [place]. .  Follow Up Plan:  Client will bring the other section to Dr. Manson Passey to fill out her portion of the paperwork.  and No further follow up planned at this time. The patient has been provided with needed resources.  Rojelio Brenner Care Guide, Embedded Care Coordination [facility], Care Management Phone: [phone] Email: [email or web address]

## 2020-06-26 ENCOUNTER — Telehealth: Payer: Self-pay | Admitting: Family Medicine

## 2020-06-26 NOTE — Telephone Encounter (Signed)
[employer] Professional Verificationform dropped off for at front desk for completion.  Verified that patient section of form has been completed.  Last DOS/WCC with PCP was 06/10/20 Placed form in team folder to be completed by clinical staff.  Tammy [name]

## 2020-06-26 NOTE — Telephone Encounter (Signed)
Reviewed form and placed in PCP's box for completion.  .Jerianne Anselmo R Leotis Isham, CMA  

## 2020-06-27 NOTE — Telephone Encounter (Signed)
Reviewed, completed, and signed form.  Note routed to RN team inbasket and placed completed form in Clinic RN's office (wall pocket above desk).  Ivis Henneman M Remedy Corporan, MD   

## 2020-07-01 NOTE — Telephone Encounter (Signed)
Patient contacted and advised forms are ready for pick up. A copy was placed in batch scanning as well.

## 2020-07-17 ENCOUNTER — Encounter: Payer: Self-pay | Admitting: Family Medicine

## 2020-07-17 ENCOUNTER — Telehealth: Payer: Self-pay | Admitting: Family Medicine

## 2020-07-17 NOTE — Progress Notes (Signed)
Letter in folder at front desk under B.  [name], MD  Family Medicine Teaching Service

## 2020-07-17 NOTE — Telephone Encounter (Signed)
Patient calling back stating she needs to mail this letter in 10 business days before the court date(08-05-2020.) She also says that Jury # [id number] needs to be included in the letter. Please call patient with any further questions.

## 2020-07-17 NOTE — Telephone Encounter (Signed)
Patient is calling and would like to know if Dr. Manson Passey can write a letter to excuse her from [facility]. She said the letter would need to state with her health conditions she is not able to serve [employer] duty. The court date is scheduled for 08/05/20.  Patient would like someone to call her when the letter is written and ready to be picked up at the front desk. The best call back is [phone].

## 2020-07-17 NOTE — Telephone Encounter (Signed)
Letter printed at front for patient to pick up.  [name], MD  Family Medicine Teaching Service

## 2020-07-17 NOTE — Telephone Encounter (Signed)
Called patient given significant ongoing stressors and significant medical conditions will write letter  [name], MD  [facility] Medicine Teaching Service

## 2020-08-05 ENCOUNTER — Ambulatory Visit: Payer: Medicaid Other | Admitting: Family Medicine

## 2020-08-29 ENCOUNTER — Telehealth: Payer: Self-pay

## 2020-08-29 ENCOUNTER — Telehealth: Payer: Self-pay | Admitting: Family Medicine

## 2020-08-29 DIAGNOSIS — N644 Mastodynia: Secondary | ICD-10-CM

## 2020-08-29 NOTE — Telephone Encounter (Signed)
Patient is calling to let Dr. Manson Passey know that she was not able to get her regular mammogram done because she reported having left breast pain. The breast center would like for Dr. Manson Passey to place an order for a diagnostic mammogram.   Please advise.

## 2020-08-29 NOTE — Addendum Note (Signed)
Addended byManson Passey, Elisandro Jarrett on: 08/29/2020 12:08 PM   Modules accepted: Orders

## 2020-08-29 NOTE — Telephone Encounter (Signed)
Patient informed of referral.  .Glennie Hawk, CMA

## 2020-08-29 NOTE — Telephone Encounter (Signed)
New orders placed. Please let patient know.  [name], MD  Family Medicine Teaching Service

## 2020-09-03 ENCOUNTER — Telehealth: Payer: Self-pay | Admitting: Family Medicine

## 2020-09-03 NOTE — Telephone Encounter (Signed)
   Telephone encounter was:  Successful.  09/03/2020 Name: Tammy [name] MRN: [id number] DOB: February 02, 1956  Tammy [name] is a 64 y.o. year old female who is a primary care patient of Westley Chandler, MD . The community resource team was consulted for assistance with Transportation Needs   Care guide performed the following interventions: Discussed resources to assist with transportation. Pt called me because she said that she hasn't heard back from [employer] after they sent her a letter and she returned the paperwork to help her with rides. After she read the paper better she realized that she will have to make a call to set up  a ride. I also gave her the Medicaid transportation line and set up transportation through [facility] for her upcoming Kidney appointment on the 17th.    Follow Up Plan:  Client will call the resources provided to her back in April and today moving forward for a ride and No further follow up planned at this time. The patient has been provided with needed resources. Rojelio Brenner Care Guide, Embedded Care Coordination [facility], Care Management Phone: [phone] Email: [email or web address]

## 2020-09-09 ENCOUNTER — Ambulatory Visit: Admission: RE | Admit: 2020-09-09 | Payer: Medicaid Other | Source: Ambulatory Visit

## 2020-09-09 ENCOUNTER — Other Ambulatory Visit: Payer: Self-pay | Admitting: Family Medicine

## 2020-09-09 ENCOUNTER — Ambulatory Visit
Admission: RE | Admit: 2020-09-09 | Discharge: 2020-09-09 | Disposition: A | Discharge status: Home / Self Care | Payer: Medicaid Other | Source: Ambulatory Visit | Attending: Family Medicine | Admitting: Family Medicine

## 2020-09-09 ENCOUNTER — Other Ambulatory Visit: Payer: Self-pay

## 2020-09-09 DIAGNOSIS — Z1231 Encounter for screening mammogram for malignant neoplasm of breast: Secondary | ICD-10-CM | POA: Diagnosis not present

## 2020-09-09 DIAGNOSIS — N644 Mastodynia: Secondary | ICD-10-CM

## 2020-09-11 ENCOUNTER — Telehealth: Payer: Self-pay | Admitting: Family Medicine

## 2020-09-11 NOTE — Telephone Encounter (Signed)
Called patient about mammogram. Has had ongoing L breast pain since 2009. Feels as though something is in breast, has to elevated breast and sleep a certain way. Scheduled for OV, will discuss next steps at upcoming visit.   [name], MD  Family Medicine Teaching Service

## 2020-09-17 ENCOUNTER — Other Ambulatory Visit: Payer: Self-pay

## 2020-09-17 MED ORDER — AMLODIPINE BESYLATE 10 MG PO TABS: 10.0000 mg | ORAL_TABLET | Freq: Every day | ORAL | 1 refills | Status: DC

## 2020-09-23 ENCOUNTER — Ambulatory Visit (INDEPENDENT_AMBULATORY_CARE_PROVIDER_SITE_OTHER): Payer: Medicaid Other

## 2020-09-23 ENCOUNTER — Ambulatory Visit (INDEPENDENT_AMBULATORY_CARE_PROVIDER_SITE_OTHER): Payer: Medicaid Other | Admitting: Family Medicine

## 2020-09-23 ENCOUNTER — Encounter: Payer: Self-pay | Admitting: Family Medicine

## 2020-09-23 ENCOUNTER — Other Ambulatory Visit: Payer: Self-pay

## 2020-09-23 ENCOUNTER — Telehealth: Payer: Self-pay | Admitting: Family Medicine

## 2020-09-23 VITALS — BP 143/60 | HR 61 | Ht 66.0 in | Wt 270.8 lb

## 2020-09-23 DIAGNOSIS — Z23 Encounter for immunization: Secondary | ICD-10-CM

## 2020-09-23 DIAGNOSIS — I10 Essential (primary) hypertension: Secondary | ICD-10-CM

## 2020-09-23 DIAGNOSIS — N1831 Chronic kidney disease, stage 3a: Secondary | ICD-10-CM | POA: Diagnosis not present

## 2020-09-23 DIAGNOSIS — N644 Mastodynia: Secondary | ICD-10-CM

## 2020-09-23 MED ORDER — SHINGRIX 50 MCG/0.5ML IM SUSR: 0.5000 mL | INTRAMUSCULAR | 0 refills | Status: DC

## 2020-09-23 NOTE — Progress Notes (Signed)
    SUBJECTIVE:   CHIEF COMPLAINT: ongoing left breast pain  HPI:   Tammy [name] is a 65 y.o. yo with history notable for hypertension, PAD, CKD III (nearly B), and CAD  presenting for breast pain.   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 Reports this is entirely positional--when she rolls over on her left breast or places pressure inferiorly along her breast she has pain. No right sided pain. She has had a biopsy in the area of pain that was negative. She had a recent mammogram which was normal. She denies pleuritic pain, neck pain, shoulder pain, exertional pain, dyspnea. She is frustrated by the pain and persistence for over 10 years  HTN Reports compliance with antihypertensive therapy. No chest pain, headaches, dyspnea. She is very worried about her kidneys and again wonders about the cause.  Transportation The patient has a history of low back pain and PAD that limit mobility. She reports she needs part of the SCAT form completed.    PERTINENT  PMH / PSH/Family/Social History : Updated and reviewed   OBJECTIVE:   BP (!) 143/60   Pulse 61   Ht 5\' 6"  (1.676 m)   Wt 270 lb 12.8 oz (122.8 kg)   SpO2 99%   BMI 43.71 kg/m   Today's weight:  Last Weight  Most recent update: 09/23/2020 10:15 AM    Weight  122.8 kg (270 lb 12.8 oz)            Review of prior weights: Filed Weights   09/23/20 1014  Weight: 270 lb 12.8 oz (122.8 kg)     Cardiac: Regular rate and rhythm. Normal S1/S2. No murmurs, rubs, or gallops appreciated. Lungs: Clear bilaterally to ascultation.  Psych: Pleasant and appropriate  See multiple prior breast exams, last late 2021, patient declines today Reviewed prior renal ultrasound   ASSESSMENT/PLAN:   Essential hypertension Near goal--markedly improved from prior.Continue current therapy. Renal function panel today. Consider increase in ARB pending RFP.  Has Nephrology follow up Again discussed  etiology CKD, first addressed April 2021    Chronic kidney disease, stage III (moderate) (HCC) Has Nephrology follow up scheduled   Mastalgia Will refer to Dr. May 2021 to see if further recommendations--differential includes ligamentous pain, medication side effect (less likely, not on aldactone) Trial topical diclofenac  Will discuss referral to Dr.Byerly.    Tobacco use Discussed cessation   HCM Shingrix Rx today COVID at follow up Follow up in September      October, MD  Family Medicine Teaching Service  [facility]

## 2020-09-23 NOTE — Patient Instructions (Addendum)
It was wonderful to see you today.  Please bring ALL of your medications with you to every visit.   Today we talked about:  --I will fill out the SCAT form  - - Take the shingles vaccine to your pharmacy  I will call you about your kidney test   You will be called by the breast center   Thank you for choosing [facility].   Please call [phone] with any questions about today's appointment.  Please be sure to schedule follow up at the front  desk before you leave today.   [name], MD  Family Medicine

## 2020-09-23 NOTE — Assessment & Plan Note (Signed)
Will refer to Dr. Donell Beers to see if further recommendations--differential includes ligamentous pain, medication side effect (less likely, not on aldactone) Trial topical diclofenac  Will discuss referral to Dr.Byerly.

## 2020-09-23 NOTE — Assessment & Plan Note (Signed)
Near goal--markedly improved from prior.Continue current therapy. Renal function panel today. Consider increase in ARB pending RFP.  Has Nephrology follow up Again discussed etiology CKD, first addressed April 2021

## 2020-09-23 NOTE — Addendum Note (Signed)
Addended by: Manson Passey, Deveion Denz on: 09/23/2020 04:01 PM   Modules accepted: Orders

## 2020-09-23 NOTE — Assessment & Plan Note (Signed)
Has Nephrology follow up scheduled

## 2020-09-23 NOTE — Telephone Encounter (Signed)
Attempted to call regarding SCAT form--no form received in inbox (letter states one was sent) and referral to Dr. Dwain Sarna. [name], MD  Family Medicine Teaching Service

## 2020-09-24 ENCOUNTER — Other Ambulatory Visit: Payer: Self-pay | Admitting: Family Medicine

## 2020-09-24 ENCOUNTER — Telehealth: Payer: Self-pay | Admitting: Family Medicine

## 2020-09-24 DIAGNOSIS — I10 Essential (primary) hypertension: Secondary | ICD-10-CM

## 2020-09-24 LAB — RENAL FUNCTION PANEL
Albumin: 4.5 g/dL (ref 3.8–4.8)
BUN/Creatinine Ratio: 20 (ref 12–28)
BUN: 39 mg/dL — ABNORMAL HIGH (ref 8–27)
CO2: 17 mmol/L — ABNORMAL LOW (ref 20–29)
Calcium: 9.9 mg/dL (ref 8.7–10.3)
Chloride: 101 mmol/L (ref 96–106)
Creatinine, Ser: 1.94 mg/dL — ABNORMAL HIGH (ref 0.57–1.00)
Glucose: 145 mg/dL — ABNORMAL HIGH (ref 65–99)
Phosphorus: 4.5 mg/dL — ABNORMAL HIGH (ref 3.0–4.3)
Potassium: 4.8 mmol/L (ref 3.5–5.2)
Sodium: 136 mmol/L (ref 134–144)
eGFR: 28 mL/min/{1.73_m2} — ABNORMAL LOW (ref >59)

## 2020-09-24 NOTE — Telephone Encounter (Signed)
Patient needed new SCAT form (gave provider letter at visit).  Patient needs to sign release of information section. Nursing form is completed. Patient coming in Monday for labs. Please call and let her know form needs signed then we fax to SCAT.  Let me know if questions. In side wall pocket.  Tammy Starr, MD  Family Medicine Teaching Service

## 2020-09-24 NOTE — Telephone Encounter (Signed)
Called patient with results.  Labs returned with a creatinine of 1.94, BUN of 39 potassium of 4.8 and phosphorus of 4.5.  This is elevated from prior.  Patient reports has not been drinking a lot of water.  She reports she is voiding a normal amount.  We will repeat next week.  We may need to hold her indapamide to reduce her losartan.  Appointment scheduled for Monday at 9 AM.  In addition discussed referral to breast clinic for evaluation of chronic mastalgia.  Will complete scat form B for  patient.

## 2020-09-25 NOTE — Telephone Encounter (Signed)
SCAT form is up front awaiting patients signature.   Please return back to RN team for processing.

## 2020-09-30 ENCOUNTER — Other Ambulatory Visit: Payer: Self-pay

## 2020-09-30 ENCOUNTER — Other Ambulatory Visit (HOSPITAL_COMMUNITY): Payer: Medicaid Other

## 2020-09-30 ENCOUNTER — Other Ambulatory Visit: Payer: Medicaid Other

## 2020-09-30 DIAGNOSIS — I10 Essential (primary) hypertension: Secondary | ICD-10-CM

## 2020-10-01 ENCOUNTER — Telehealth: Payer: Self-pay | Admitting: Family Medicine

## 2020-10-01 ENCOUNTER — Other Ambulatory Visit: Payer: Self-pay | Admitting: Family Medicine

## 2020-10-01 DIAGNOSIS — I10 Essential (primary) hypertension: Secondary | ICD-10-CM

## 2020-10-01 LAB — BASIC METABOLIC PANEL
BUN/Creatinine Ratio: 23 (ref 12–28)
BUN: 45 mg/dL — ABNORMAL HIGH (ref 8–27)
CO2: 18 mmol/L — ABNORMAL LOW (ref 20–29)
Calcium: 9.2 mg/dL (ref 8.7–10.3)
Chloride: 103 mmol/L (ref 96–106)
Creatinine, Ser: 1.98 mg/dL — ABNORMAL HIGH (ref 0.57–1.00)
Glucose: 133 mg/dL — ABNORMAL HIGH (ref 65–99)
Potassium: 4.6 mmol/L (ref 3.5–5.2)
Sodium: 135 mmol/L (ref 134–144)
eGFR: 28 mL/min/{1.73_m2} — ABNORMAL LOW (ref >59)

## 2020-10-01 NOTE — Telephone Encounter (Signed)
Called patient. No NSAID use, no other OTC use. Possibly due to dehydration in setting of less PO intake. Eating, drinking well, voiding normal amount.   - Hold losartan and indapamide  - Repeat creatinine, recommend Thurs/Friday. Patient prefers Monday. Discussed limiting OTC use.  - Has nephrology follow up scheduled  [name], MD  [facility] Medicine Teaching Service

## 2020-10-07 ENCOUNTER — Other Ambulatory Visit: Payer: Self-pay

## 2020-10-07 ENCOUNTER — Other Ambulatory Visit: Payer: Medicaid Other

## 2020-10-07 DIAGNOSIS — I10 Essential (primary) hypertension: Secondary | ICD-10-CM | POA: Diagnosis not present

## 2020-10-08 ENCOUNTER — Telehealth: Payer: Self-pay | Admitting: Family Medicine

## 2020-10-08 DIAGNOSIS — I10 Essential (primary) hypertension: Secondary | ICD-10-CM

## 2020-10-08 LAB — COMPREHENSIVE METABOLIC PANEL
ALT: 7 IU/L (ref 0–32)
AST: 14 IU/L (ref 0–40)
Albumin/Globulin Ratio: 1.2 (ref 1.2–2.2)
Albumin: 4.1 g/dL (ref 3.8–4.8)
Alkaline Phosphatase: 108 IU/L (ref 44–121)
BUN/Creatinine Ratio: 12 (ref 12–28)
BUN: 18 mg/dL (ref 8–27)
Bilirubin Total: 0.4 mg/dL (ref 0.0–1.2)
CO2: 20 mmol/L (ref 20–29)
Calcium: 9.4 mg/dL (ref 8.7–10.3)
Chloride: 105 mmol/L (ref 96–106)
Creatinine, Ser: 1.45 mg/dL — ABNORMAL HIGH (ref 0.57–1.00)
Globulin, Total: 3.3 g/dL (ref 1.5–4.5)
Glucose: 120 mg/dL — ABNORMAL HIGH (ref 65–99)
Potassium: 4.3 mmol/L (ref 3.5–5.2)
Sodium: 138 mmol/L (ref 134–144)
Total Protein: 7.4 g/dL (ref 6.0–8.5)
eGFR: 40 mL/min/{1.73_m2} — ABNORMAL LOW (ref >59)

## 2020-10-08 MED ORDER — BLOOD PRESSURE CUFF MISC: 0 refills | Status: DC

## 2020-10-08 NOTE — Telephone Encounter (Signed)
Called with results. Restarted indapamide. Has repeat next week (Nephrology on 8/17). Will likely restart ARB as able.  All questions answered. Teachback method used.   [name], MD  Family Medicine Teaching Service

## 2020-10-16 DIAGNOSIS — Z720 Tobacco use: Secondary | ICD-10-CM | POA: Diagnosis not present

## 2020-10-16 DIAGNOSIS — I129 Hypertensive chronic kidney disease with stage 1 through stage 4 chronic kidney disease, or unspecified chronic kidney disease: Secondary | ICD-10-CM | POA: Diagnosis not present

## 2020-10-16 DIAGNOSIS — E669 Obesity, unspecified: Secondary | ICD-10-CM | POA: Diagnosis not present

## 2020-10-16 DIAGNOSIS — N1832 Chronic kidney disease, stage 3b: Secondary | ICD-10-CM | POA: Diagnosis not present

## 2020-10-16 DIAGNOSIS — I2510 Atherosclerotic heart disease of native coronary artery without angina pectoris: Secondary | ICD-10-CM | POA: Diagnosis not present

## 2020-10-16 DIAGNOSIS — N179 Acute kidney failure, unspecified: Secondary | ICD-10-CM | POA: Diagnosis not present

## 2020-10-17 NOTE — Telephone Encounter (Signed)
Patient returns call to nurse line regarding BP cuff and machine. This is not covered under medicaid as it is a Set designer.   Patient states that she cannot afford to purchase a BP cuff and machine and wanted to make provider aware.   Veronda Prude, RN

## 2020-10-17 NOTE — Telephone Encounter (Signed)
Noted.   Josceline Chenard, MD  Family Medicine Teaching Service   

## 2020-10-21 ENCOUNTER — Telehealth: Payer: Self-pay

## 2020-10-21 NOTE — Telephone Encounter (Signed)
Patient calls nurse line requesting nurse BP check. Patient was last seen in office on 7/25, however, there was no mention of patient returning for nurse visit for BP check.   Typically, patient would need office visit, however, we do not have any appointments until next week. Patient states that she needs BP check prior to appointment with nephrologist this week.   Patient scheduled nurse visit for BP check with front office. Please advise if this is appropriate or if it needs to be rescheduled as an office visit for next week.   Veronda Prude, RN

## 2020-10-21 NOTE — Telephone Encounter (Signed)
Agree with nurse visit--I am precepting during this time and can be available to answer questions.  Thank you!   [name], MD  Family Medicine Teaching Service

## 2020-10-23 ENCOUNTER — Other Ambulatory Visit: Payer: Self-pay

## 2020-10-23 ENCOUNTER — Ambulatory Visit (INDEPENDENT_AMBULATORY_CARE_PROVIDER_SITE_OTHER): Payer: Medicaid Other

## 2020-10-23 VITALS — BP 138/73 | HR 52

## 2020-10-23 DIAGNOSIS — Z0130 Encounter for examination of blood pressure without abnormal findings: Secondary | ICD-10-CM

## 2020-10-23 NOTE — Patient Instructions (Signed)
Today your BP was 138/73 and your heart rate was 52.  Please contact our office at [phone] if you have any further concerns regarding your blood pressure.   Have a nice day!  Veronda Prude, RN

## 2020-10-23 NOTE — Progress Notes (Signed)
Patient presents to nurse clinic for BP check. Last BP on 09/23/2020: 143/60.   Today BP is 138/73. Obtained with regular adult cuff on left forearm. HR: 52 (normal trend for patient, per chart review). SpO2: 100%.   Patient has been taking all medications as prescribed. Patient also has follow up appointment with PCP on 11/11/2020.  Veronda Prude, RN

## 2020-11-06 ENCOUNTER — Other Ambulatory Visit: Payer: Self-pay

## 2020-11-06 DIAGNOSIS — I10 Essential (primary) hypertension: Secondary | ICD-10-CM

## 2020-11-06 MED ORDER — METOPROLOL TARTRATE 100 MG PO TABS: 100.0000 mg | ORAL_TABLET | Freq: Two times a day (BID) | ORAL | 2 refills | Status: DC

## 2020-11-07 ENCOUNTER — Other Ambulatory Visit: Payer: Self-pay

## 2020-11-07 NOTE — Telephone Encounter (Signed)
Attempted to call patient about indapamide refill.  Left generic voicemail to call back.  She is no longer supposed to be taking this medication.  She should be aware of this.  Let me know if she is questions about this.  She has follow-up scheduled with me.

## 2020-11-11 ENCOUNTER — Other Ambulatory Visit: Payer: Self-pay

## 2020-11-11 ENCOUNTER — Ambulatory Visit (INDEPENDENT_AMBULATORY_CARE_PROVIDER_SITE_OTHER): Payer: Medicaid Other | Admitting: Family Medicine

## 2020-11-11 ENCOUNTER — Encounter: Payer: Self-pay | Admitting: Family Medicine

## 2020-11-11 VITALS — BP 122/60 | HR 58 | Wt 268.8 lb

## 2020-11-11 DIAGNOSIS — L02818 Cutaneous abscess of other sites: Secondary | ICD-10-CM | POA: Diagnosis not present

## 2020-11-11 DIAGNOSIS — Z23 Encounter for immunization: Secondary | ICD-10-CM

## 2020-11-11 DIAGNOSIS — N1832 Chronic kidney disease, stage 3b: Secondary | ICD-10-CM

## 2020-11-11 DIAGNOSIS — I10 Essential (primary) hypertension: Secondary | ICD-10-CM | POA: Diagnosis not present

## 2020-11-11 DIAGNOSIS — Z716 Tobacco abuse counseling: Secondary | ICD-10-CM | POA: Diagnosis not present

## 2020-11-11 MED ORDER — NITROGLYCERIN 0.4 MG SL SUBL: 0.4000 mg | SUBLINGUAL_TABLET | SUBLINGUAL | 6 refills | Status: DC | PRN

## 2020-11-11 MED ORDER — CLINDAMYCIN PHOSPHATE 1 % EX SOLN: Freq: Two times a day (BID) | CUTANEOUS | 2 refills | Status: DC

## 2020-11-11 MED ORDER — INDAPAMIDE 2.5 MG PO TABS: 2.5000 mg | ORAL_TABLET | Freq: Every day | ORAL | 3 refills | Status: DC

## 2020-11-11 NOTE — Assessment & Plan Note (Signed)
Refilled her indapamide today.  Continue current therapies.  We discussed Dr. Mercy Riding recommendations at length.  We reviewed her note together.  All questions were answered.

## 2020-11-11 NOTE — Patient Instructions (Addendum)
  It was wonderful to see you today.  Please bring ALL of your medications with you to every visit.   Today we talked about:  - I sent in your refills for the solution  GREAT WORK ON YOUR BLOOD PRESSURE   Think about the tetanus shot    Dr. Hiram Gash about breast pain    Address: [address], [place], [place] [address] Phone: [phone]   Thank you for choosing [facility].   Please call [phone] with any questions about today's appointment. FOLLOW UP IN 2 MONTHS   Please be sure to schedule follow up at the front  desk before you leave today.   [name], MD  Family Medicine

## 2020-11-11 NOTE — Progress Notes (Signed)
    SUBJECTIVE:   CHIEF COMPLAINT: blood pressure HPI:   Tammy [name] is a 65 y.o. yo with history notable for tobacco use, hypertension, chronic kidney disease stage IIIB and chronic left breast pain presenting for follow-up.  Patient has not yet heard from the breast surgeons about her chronic mastalgia of the left breast.  She feels of her left breast fold and that she elevates it at night.  She denies dyspnea, chest pain with exertion, palpitations or lower extremity edema.  The patient brings all of her medications with her today and has some questions about her follow-up visit with nephrology.  She wonders why protein is important and what creatinine is.  We discussed this in plain language again today.  The patient reports need refills on indapamide and her clindamycin solution.  Patient is precontemplative on tobacco cessation.  She is a number of stressors in her life she reports limiting her use since there is no longer menthol in her cigarettes.  PERTINENT  PMH / PSH/Family/Social History : Updated and reviewed as appropriate.  OBJECTIVE:   BP 122/60   Pulse (!) 58   Wt 268 lb 12.8 oz (121.9 kg)   SpO2 98%   BMI 43.39 kg/m   Today's weight:  Last Weight  Most recent update: 11/11/2020  9:46 AM    Weight  121.9 kg (268 lb 12.8 oz)            Review of prior weights: [employer]   11/11/20 0946  Weight: 268 lb 12.8 oz (121.9 kg)     Cardiac: Regular rate and rhythm. Normal S1/S2. No murmurs, rubs, or gallops appreciated. Lungs: Clear bilaterally to ascultation.  Psych: Pleasant and appropriate   Patient had a mammogram in July which was normal.  She has a history of a cardiac catheterization for the similar left-sided breast/chest pain.  She regularly follows with cardiology. ASSESSMENT/PLAN:   Essential hypertension Refilled her indapamide today.  Continue current therapies.  We discussed Dr. Mercy Riding recommendations at length.  We reviewed her note  together.  All questions were answered.  CKD (chronic kidney disease), stage III (HCC) Stage IIIb.  Follows with nephrology.  Reviewed their notes and recent labs.  Repeat labs at follow-up.   Medication Management reviewed mediations today  Mastalgia Number given to breast surgeon, reviewed return precautions   HCM  Flu vaccine today     [name], MD  Family Medicine Teaching Service  [facility]   `

## 2020-11-11 NOTE — Assessment & Plan Note (Signed)
Stage IIIb.  Follows with nephrology.  Reviewed their notes and recent labs.  Repeat labs at follow-up.

## 2021-01-06 ENCOUNTER — Other Ambulatory Visit: Payer: Self-pay

## 2021-01-06 ENCOUNTER — Ambulatory Visit (INDEPENDENT_AMBULATORY_CARE_PROVIDER_SITE_OTHER): Payer: Medicaid Other

## 2021-01-06 ENCOUNTER — Encounter: Payer: Self-pay | Admitting: Family Medicine

## 2021-01-06 ENCOUNTER — Ambulatory Visit (INDEPENDENT_AMBULATORY_CARE_PROVIDER_SITE_OTHER): Payer: Medicaid Other | Admitting: Family Medicine

## 2021-01-06 VITALS — BP 130/65 | HR 57 | Wt 271.6 lb

## 2021-01-06 DIAGNOSIS — N1832 Chronic kidney disease, stage 3b: Secondary | ICD-10-CM | POA: Diagnosis not present

## 2021-01-06 DIAGNOSIS — I739 Peripheral vascular disease, unspecified: Secondary | ICD-10-CM | POA: Diagnosis not present

## 2021-01-06 DIAGNOSIS — I10 Essential (primary) hypertension: Secondary | ICD-10-CM | POA: Diagnosis not present

## 2021-01-06 DIAGNOSIS — I2510 Atherosclerotic heart disease of native coronary artery without angina pectoris: Secondary | ICD-10-CM

## 2021-01-06 DIAGNOSIS — Z1211 Encounter for screening for malignant neoplasm of colon: Secondary | ICD-10-CM

## 2021-01-06 DIAGNOSIS — Z23 Encounter for immunization: Secondary | ICD-10-CM

## 2021-01-06 NOTE — Patient Instructions (Addendum)
It was wonderful to see you today.  Please bring ALL of your medications with you to every visit.   Today we talked about:  - You will be called by the Gastroenterologists for a colonscopy   --- we will talk about a lung CT scan at your next visit      Thank you for choosing [facility].   Please call [phone] with any questions about today's appointment.  Please be sure to schedule follow up at the front  desk before you leave today.   [name], MD  Family Medicine     Address: [address], [place], [place] [address] Phone: [phone]

## 2021-01-06 NOTE — Assessment & Plan Note (Addendum)
We discussed that although chronic kidney disease can demonstrate some improvement over time with improved blood pressure control and smoking cessation that is not entirely reversible nor is there an easy solution..  We discussed management including optimal blood pressure control.  Monitoring labs obtained today.

## 2021-01-06 NOTE — Assessment & Plan Note (Signed)
Continue current therapies.  She is currently on indapamide, amlodipine, metoprolol.

## 2021-01-06 NOTE — Progress Notes (Signed)
    SUBJECTIVE:   CHIEF COMPLAINT: Discuss weight change HPI:   Tammy [name] is a 65 y.o. yo with history notable for hypertension, chronic kidney disease stage IIIb and obesity presenting for follow-up.   Patient reports today she is interested in solutions for medical conditions.  She reports we often talk about her blood pressure and her medications but never take her off medications.  She is interested in finding a solution to her chronic kidney disease.  In terms of the patient's blood pressure she reports compliance with her medications.  She denies side effects.  She denies dyspnea or chest pain.  The patient reports her recurrent abscesses are improved.  She has not yet heard from the breast surgeon with regards to her chronic breast pain.  Number was provided for her today to schedule.  Patient reports she did not make her appointment for colonoscopy and is interested in undergoing this.  Patient continues to smoke.  She reports her biggest fear about quitting smoking is that she will gain weight.  She would very much like to rather focus on losing weight.  She does not smoke more than half pack of cigarettes a day and really would be interested in quitting if she could also lose weight.  She reports smoking is her distraction and she sometimes uses it to cope.   PERTINENT  PMH / PSH/Family/Social History : Updated and reviewed   OBJECTIVE:   BP 130/65   Pulse (!) 57   Wt 271 lb 9.6 oz (123.2 kg)   SpO2 98%   BMI 43.84 kg/m   Today's weight:  Last Weight  Most recent update: 01/06/2021 10:30 AM    Weight  123.2 kg (271 lb 9.6 oz)            Review of prior weights: Filed Weights   01/06/21 1029  Weight: 271 lb 9.6 oz (123.2 kg)     Cardiac: Regular rate and rhythm. Normal S1/S2. No murmurs, rubs, or gallops appreciated. Lungs: Clear bilaterally to ascultation.  Abdomen: Normoactive bowel sounds. No tenderness to deep or light palpation. No rebound or  guarding.  Psych: Pleasant and appropriate    ASSESSMENT/PLAN:   CKD (chronic kidney disease), stage III (HCC) We discussed that although chronic kidney disease can demonstrate some improvement over time with improved blood pressure control and smoking cessation that is not entirely reversible nor is there an easy solution..  We discussed management including optimal blood pressure control.  Monitoring labs obtained today.  Essential hypertension Continue current therapies.  She is currently on indapamide, amlodipine, metoprolol.    Obesity, patient brings with her several supplements, discussed options, information about Wegovy provided.   HCM Referral for colonoscopy  Discuss low dose CT and her PCV20 at follow up (declined today as she has recently had shingles  Number given to breast clinic (was also on AVS in September).      [name], MD  Family Medicine Teaching Service  [facility]

## 2021-01-07 ENCOUNTER — Telehealth: Payer: Self-pay | Admitting: Family Medicine

## 2021-01-07 DIAGNOSIS — I739 Peripheral vascular disease, unspecified: Secondary | ICD-10-CM

## 2021-01-07 LAB — RENAL FUNCTION PANEL
Albumin: 4.3 g/dL (ref 3.8–4.8)
BUN/Creatinine Ratio: 14 (ref 12–28)
BUN: 23 mg/dL (ref 8–27)
CO2: 21 mmol/L (ref 20–29)
Calcium: 9.3 mg/dL (ref 8.7–10.3)
Chloride: 104 mmol/L (ref 96–106)
Creatinine, Ser: 1.59 mg/dL — ABNORMAL HIGH (ref 0.57–1.00)
Glucose: 140 mg/dL — ABNORMAL HIGH (ref 70–99)
Phosphorus: 4.1 mg/dL (ref 3.0–4.3)
Potassium: 4.4 mmol/L (ref 3.5–5.2)
Sodium: 139 mmol/L (ref 134–144)
eGFR: 36 mL/min/{1.73_m2} — ABNORMAL LOW (ref >59)

## 2021-01-07 LAB — CBC
Hematocrit: 33.9 % — ABNORMAL LOW (ref 34.0–46.6)
Hemoglobin: 10.8 g/dL — ABNORMAL LOW (ref 11.1–15.9)
MCH: 26.8 pg (ref 26.6–33.0)
MCHC: 31.9 g/dL (ref 31.5–35.7)
MCV: 84 fL (ref 79–97)
Platelets: 240 10*3/uL (ref 150–450)
RBC: 4.03 x10E6/uL (ref 3.77–5.28)
RDW: 12.1 % (ref 11.7–15.4)
WBC: 5.3 10*3/uL (ref 3.4–10.8)

## 2021-01-07 LAB — LDL CHOLESTEROL, DIRECT: LDL Direct: 100 mg/dL — ABNORMAL HIGH (ref 0–99)

## 2021-01-07 MED ORDER — ROSUVASTATIN CALCIUM 20 MG PO TABS: 20.0000 mg | ORAL_TABLET | Freq: Every day | ORAL | 3 refills | Status: DC

## 2021-01-07 NOTE — Telephone Encounter (Signed)
Called patient to discuss results.  She has a mild normocytic anemia.  Scheduled for follow-up for iron panel and ferritin.  Suspect this is from chronic kidney disease.  Referred to gastroenterology again during visit.  Strongly advised patient to schedule with GI.  LDL cholesterols not near goal.  Increase rosuvastatin.  We are nearly maximal dose given her GFR.  May need to transition to atorvastatin in the future.  Consider addition of ezetimibe versus referral for PCSK9 inhibitor in the future.  She has diffuse coronary disease but no history of stents.  All questions answered.  Use teach back method to ensure patient knew about all other medication changes which was only the Crestor at this point.  She is to bring all medications to her follow-up.

## 2021-02-03 ENCOUNTER — Other Ambulatory Visit: Payer: Self-pay

## 2021-02-03 DIAGNOSIS — I739 Peripheral vascular disease, unspecified: Secondary | ICD-10-CM

## 2021-02-03 MED ORDER — CLOPIDOGREL BISULFATE 75 MG PO TABS: 75.0000 mg | ORAL_TABLET | Freq: Every day | ORAL | 3 refills | Status: DC

## 2021-02-10 ENCOUNTER — Encounter: Payer: Self-pay | Admitting: Family Medicine

## 2021-02-10 ENCOUNTER — Other Ambulatory Visit: Payer: Self-pay

## 2021-02-10 ENCOUNTER — Ambulatory Visit (INDEPENDENT_AMBULATORY_CARE_PROVIDER_SITE_OTHER): Payer: Medicaid Other | Admitting: Family Medicine

## 2021-02-10 VITALS — BP 151/84 | HR 58 | Wt 270.0 lb

## 2021-02-10 DIAGNOSIS — Z720 Tobacco use: Secondary | ICD-10-CM

## 2021-02-10 DIAGNOSIS — N644 Mastodynia: Secondary | ICD-10-CM

## 2021-02-10 DIAGNOSIS — I10 Essential (primary) hypertension: Secondary | ICD-10-CM

## 2021-02-10 DIAGNOSIS — N1832 Chronic kidney disease, stage 3b: Secondary | ICD-10-CM | POA: Diagnosis not present

## 2021-02-10 DIAGNOSIS — I739 Peripheral vascular disease, unspecified: Secondary | ICD-10-CM | POA: Diagnosis not present

## 2021-02-10 DIAGNOSIS — F17200 Nicotine dependence, unspecified, uncomplicated: Secondary | ICD-10-CM

## 2021-02-10 DIAGNOSIS — D649 Anemia, unspecified: Secondary | ICD-10-CM

## 2021-02-10 MED ORDER — INSULIN PEN NEEDLE 31G X 8 MM MISC: 0 refills | Status: DC

## 2021-02-10 MED ORDER — SEMAGLUTIDE(0.25 OR 0.5MG/DOS) 2 MG/1.5ML ~~LOC~~ SOPN
0.2500 mg | PEN_INJECTOR | SUBCUTANEOUS | 1 refills | Status: DC
Start: 2021-02-10 — End: 2021-12-30

## 2021-02-10 MED ORDER — HYDRALAZINE HCL 25 MG PO TABS
50.0000 mg | ORAL_TABLET | Freq: Two times a day (BID) | ORAL | Status: DC
Start: 2021-02-10 — End: 2021-03-05

## 2021-02-10 NOTE — Progress Notes (Signed)
    SUBJECTIVE:   CHIEF COMPLAINT: BP check in  HPI:   Tammy [name] is a 65 y.o.  with history notable for CKD, HTN, and obesity presenting for follow up.  Patient reports she is overall doing okay. Has some questions about [facility] and upcoming Nephrology visit.  She has all her medications with her today. Denies headaches, chest pain, vision changes, LE edema, dyspnea. Smoking 5 cigarettes per day, reports she 'just puffs' on them. Has smoked since age 60, between 5-20 cigarettes per day.    PERTINENT  PMH / PSH/Family/Social History :   OBJECTIVE:   BP (!) 151/84   Pulse (!) 58   Wt 270 lb (122.5 kg)   SpO2 100%   BMI 43.58 kg/m   Today's weight:  Last Weight  Most recent update: 02/10/2021 10:21 AM    Weight  122.5 kg (270 lb)            Review of prior weights: Filed Weights   02/10/21 1020  Weight: 270 lb (122.5 kg)     Cardiac: Regular rate and rhythm. Normal S1/S2. No murmurs, rubs, or gallops appreciated. Lungs: Clear bilaterally to ascultation.  Abdomen: Normoactive bowel sounds. No tenderness to deep or light palpation. No rebound or guarding.   Psych: Pleasant and appropriate  Neck No LAD, no thyromegaly, TTP over L SCM   ASSESSMENT/PLAN:   Essential hypertension Not at goal. Increased Hydralazine to 50 mg BID--recognize TID is preferred but due to patient preference and likelihood adherence, will dose BID Labs for Dr. Malen Gauze today, will send in Epic to her Follow up in March   Mastalgia Number given for Dr. Doreen Salvage office-confirmed with office manager he will see patient   TOBACCO DEPENDENCE Discussed cessation, >20 pack years in history, smoked since age 10 a variable amount, pre contemplative about quitting   Morbid obesity Discussed options. Answered questions about semaglutide, Rx to pharmacy.  If starts, will need follow up in 4 weeks for titration.    Neck Pain ,likely trapezius strain, recommended conservative measures, not  exertional and no associated with LAD-monitor at follow up.   Normocytic anemia, likely related to CKD, iron and ferritin today.   HCM Consider PCV 20 at follow up     [name], MD  Family Medicine Teaching Service  [facility]

## 2021-02-10 NOTE — Patient Instructions (Addendum)
It was wonderful to see you today.  Please bring ALL of your medications with you to every visit.   Today we talked about:  Breast pain- if recurs, can call Dr. Doreen Salvage office Address: [address], [place], [place] [address] Phone: [phone]  I will let Dr. Malen Gauze know the results of your blood work  For blood pressure:  FOLLOW UP IN 3 MONTHS   IT WAS GREAT TO SEE YOU!!!!   MERRY CHRISTMAS!!!     Thank you for choosing [facility].   Please call [phone] with any questions about today's appointment.  Please be sure to schedule follow up at the front  desk before you leave today.   [name], MD  Family Medicine

## 2021-02-10 NOTE — Assessment & Plan Note (Signed)
Discussed cessation, >20 pack years in history, smoked since age 65 a variable amount, pre contemplative about quitting

## 2021-02-10 NOTE — Assessment & Plan Note (Signed)
Number given for Dr. Doreen Salvage office-confirmed with office manager he will see patient

## 2021-02-10 NOTE — Assessment & Plan Note (Signed)
Not at goal. Increased Hydralazine to 50 mg BID--recognize TID is preferred but due to patient preference and likelihood adherence, will dose BID Labs for Dr. Malen Gauze today, will send in Epic to her Follow up in March

## 2021-02-10 NOTE — Assessment & Plan Note (Signed)
Discussed options. Answered questions about semaglutide, Rx to pharmacy.  If starts, will need follow up in 4 weeks for titration.

## 2021-02-11 ENCOUNTER — Encounter: Payer: Self-pay | Admitting: Family Medicine

## 2021-02-11 ENCOUNTER — Telehealth: Payer: Self-pay | Admitting: Family Medicine

## 2021-02-11 DIAGNOSIS — I2510 Atherosclerotic heart disease of native coronary artery without angina pectoris: Secondary | ICD-10-CM

## 2021-02-11 LAB — RENAL FUNCTION PANEL
Albumin: 4.4 g/dL (ref 3.8–4.8)
BUN/Creatinine Ratio: 13 (ref 12–28)
BUN: 23 mg/dL (ref 8–27)
CO2: 19 mmol/L — ABNORMAL LOW (ref 20–29)
Calcium: 9.6 mg/dL (ref 8.7–10.3)
Chloride: 106 mmol/L (ref 96–106)
Creatinine, Ser: 1.78 mg/dL — ABNORMAL HIGH (ref 0.57–1.00)
Glucose: 135 mg/dL — ABNORMAL HIGH (ref 70–99)
Phosphorus: 4.3 mg/dL (ref 3.0–4.3)
Potassium: 5 mmol/L (ref 3.5–5.2)
Sodium: 143 mmol/L (ref 134–144)
eGFR: 31 mL/min/{1.73_m2} — ABNORMAL LOW (ref >59)

## 2021-02-11 LAB — IRON AND TIBC
Iron Saturation: 22 % (ref 15–55)
Iron: 65 ug/dL (ref 27–139)
Total Iron Binding Capacity: 289 ug/dL (ref 250–450)
UIBC: 224 ug/dL (ref 118–369)

## 2021-02-11 LAB — LDL CHOLESTEROL, DIRECT: LDL Direct: 100 mg/dL — ABNORMAL HIGH (ref 0–99)

## 2021-02-11 LAB — CBC
Hematocrit: 36.5 % (ref 34.0–46.6)
Hemoglobin: 11.7 g/dL (ref 11.1–15.9)
MCH: 27 pg (ref 26.6–33.0)
MCHC: 32.1 g/dL (ref 31.5–35.7)
MCV: 84 fL (ref 79–97)
Platelets: 216 10*3/uL (ref 150–450)
RBC: 4.33 x10E6/uL (ref 3.77–5.28)
RDW: 12 % (ref 11.7–15.4)
WBC: 6.1 10*3/uL (ref 3.4–10.8)

## 2021-02-11 LAB — FERRITIN: Ferritin: 413 ng/mL — ABNORMAL HIGH (ref 15–150)

## 2021-02-11 MED ORDER — EZETIMIBE 10 MG PO TABS: 10.0000 mg | ORAL_TABLET | Freq: Every evening | ORAL | 3 refills | Status: DC

## 2021-02-11 NOTE — Telephone Encounter (Signed)
Called patient to discuss results.  Will forward results of renal panel and anemia panel to Dr. Malen Gauze.  Kidney function is near baseline.  We will continue current medications at this time.  Her LDL continues to be elevated.  Discussed addition of ezetimibe with her.  She previously had possibly a vaginal rash with this.  She is amenable to trying this again.  We will also send a letter with some information.  All questions answered.  Repeat full lipid panel in 2 to 3 months.

## 2021-02-17 ENCOUNTER — Telehealth: Payer: Self-pay

## 2021-02-17 ENCOUNTER — Other Ambulatory Visit: Payer: Self-pay | Admitting: Family Medicine

## 2021-02-17 DIAGNOSIS — N281 Cyst of kidney, acquired: Secondary | ICD-10-CM | POA: Diagnosis not present

## 2021-02-17 DIAGNOSIS — I129 Hypertensive chronic kidney disease with stage 1 through stage 4 chronic kidney disease, or unspecified chronic kidney disease: Secondary | ICD-10-CM | POA: Diagnosis not present

## 2021-02-17 DIAGNOSIS — N1832 Chronic kidney disease, stage 3b: Secondary | ICD-10-CM | POA: Diagnosis not present

## 2021-02-17 DIAGNOSIS — I2510 Atherosclerotic heart disease of native coronary artery without angina pectoris: Secondary | ICD-10-CM | POA: Diagnosis not present

## 2021-02-17 DIAGNOSIS — Z720 Tobacco use: Secondary | ICD-10-CM | POA: Diagnosis not present

## 2021-02-17 DIAGNOSIS — E669 Obesity, unspecified: Secondary | ICD-10-CM | POA: Diagnosis not present

## 2021-02-17 NOTE — Telephone Encounter (Signed)
Patient has some confusion about Dr. Malen Gauze.  - Stopped indapamide - Start low potassium foods -Start losartan 25 mg daily - Requests to have repeat labs in 2 weeks. Ordered renal function panel and scheduled for labs.  Patient requests CT be done at [facility]-- nursing---can this be done at [facility]?  [name], MD  Family Medicine Teaching Service

## 2021-02-17 NOTE — Telephone Encounter (Signed)
Patient calls nurse line reporting she needs to speak with PCP about lab work. Patient reports she just left Dr. Roni Bread office and they are needing repeat labs on her.  Will forward to PCP.

## 2021-02-18 NOTE — Telephone Encounter (Signed)
Thank you for that information. Please call and let patient know.   [name], MD  Family Medicine Teaching Service

## 2021-02-18 NOTE — Telephone Encounter (Signed)
No they have imaging done at certain locations and the schedulers let us know when we schedule. So it cant be changed. Arthuro Canelo Bruna Potter, CMA

## 2021-02-19 NOTE — Telephone Encounter (Signed)
Attempted to call pt no answer. I did inform her when I had to reschedule her twice. Will try again later. Trinty Marken Bruna Potter, CMA

## 2021-02-21 ENCOUNTER — Ambulatory Visit (HOSPITAL_COMMUNITY): Payer: Medicaid Other

## 2021-02-26 ENCOUNTER — Ambulatory Visit (HOSPITAL_COMMUNITY): Payer: Medicaid Other

## 2021-03-04 ENCOUNTER — Other Ambulatory Visit: Payer: Medicaid Other

## 2021-03-04 ENCOUNTER — Other Ambulatory Visit: Payer: Self-pay

## 2021-03-04 DIAGNOSIS — N1832 Chronic kidney disease, stage 3b: Secondary | ICD-10-CM

## 2021-03-05 ENCOUNTER — Telehealth: Payer: Self-pay | Admitting: Family Medicine

## 2021-03-05 DIAGNOSIS — N1832 Chronic kidney disease, stage 3b: Secondary | ICD-10-CM

## 2021-03-05 LAB — RENAL FUNCTION PANEL
Albumin: 4.2 g/dL (ref 3.8–4.8)
BUN/Creatinine Ratio: 16 (ref 12–28)
BUN: 21 mg/dL (ref 8–27)
CO2: 17 mmol/L — ABNORMAL LOW (ref 20–29)
Calcium: 9.3 mg/dL (ref 8.7–10.3)
Chloride: 106 mmol/L (ref 96–106)
Creatinine, Ser: 1.32 mg/dL — ABNORMAL HIGH (ref 0.57–1.00)
Glucose: 117 mg/dL — ABNORMAL HIGH (ref 70–99)
Phosphorus: 4.2 mg/dL (ref 3.0–4.3)
Potassium: 4.4 mmol/L (ref 3.5–5.2)
Sodium: 138 mmol/L (ref 134–144)
eGFR: 45 mL/min/{1.73_m2} — ABNORMAL LOW (ref >59)

## 2021-03-05 MED ORDER — HYDRALAZINE HCL 50 MG PO TABS: 50.0000 mg | ORAL_TABLET | Freq: Two times a day (BID) | ORAL | 1 refills | Status: DC

## 2021-03-05 MED ORDER — CYCLOBENZAPRINE HCL 5 MG PO TABS: 5.0000 mg | ORAL_TABLET | Freq: Three times a day (TID) | ORAL | 6 refills | Status: DC | PRN

## 2021-03-05 NOTE — Telephone Encounter (Signed)
Called patient with results--K appropriate on losartan. Forward to Dr. Malen Gauze. Refilled medications as requested.   [name], MD  Family Medicine Teaching Service

## 2021-03-21 ENCOUNTER — Other Ambulatory Visit: Payer: Self-pay

## 2021-03-21 ENCOUNTER — Ambulatory Visit (INDEPENDENT_AMBULATORY_CARE_PROVIDER_SITE_OTHER): Payer: Medicaid Other | Admitting: Family Medicine

## 2021-03-21 ENCOUNTER — Encounter: Payer: Self-pay | Admitting: Family Medicine

## 2021-03-21 VITALS — BP 128/82 | HR 53 | Wt 267.0 lb

## 2021-03-21 DIAGNOSIS — I739 Peripheral vascular disease, unspecified: Secondary | ICD-10-CM | POA: Diagnosis not present

## 2021-03-21 DIAGNOSIS — E782 Mixed hyperlipidemia: Secondary | ICD-10-CM | POA: Diagnosis not present

## 2021-03-21 DIAGNOSIS — M542 Cervicalgia: Secondary | ICD-10-CM

## 2021-03-21 DIAGNOSIS — F17200 Nicotine dependence, unspecified, uncomplicated: Secondary | ICD-10-CM

## 2021-03-21 DIAGNOSIS — I10 Essential (primary) hypertension: Secondary | ICD-10-CM | POA: Diagnosis not present

## 2021-03-21 DIAGNOSIS — I2510 Atherosclerotic heart disease of native coronary artery without angina pectoris: Secondary | ICD-10-CM

## 2021-03-21 DIAGNOSIS — Z23 Encounter for immunization: Secondary | ICD-10-CM

## 2021-03-21 MED ORDER — TETANUS-DIPHTH-ACELL PERTUSSIS 5-2.5-18.5 LF-MCG/0.5 IM SUSY: 0.5000 mL | PREFILLED_SYRINGE | Freq: Once | INTRAMUSCULAR | 0 refills | Status: AC

## 2021-03-21 NOTE — Patient Instructions (Addendum)
It was wonderful to see you today.  Please bring ALL of your medications with you to every visit.   Today we talked about:   An x-ray was ordered for you---you do not need an appointment to have this completed.  I recommend going to [facility] [address] [place] [address] [place]   If the results are normal,I will send you a letter  I will call you with results if anything is abnormal   I will check on the [facility]  You can get your Tdap at your pharmacy     Thank you for choosing [facility].   Please call [phone] with any questions about today's appointment.  Please be sure to schedule follow up at the front  desk before you leave today.   [name], MD  Family Medicine

## 2021-03-21 NOTE — Assessment & Plan Note (Signed)
Continue beta-blocker, statin, P2Y12 inhibitor

## 2021-03-21 NOTE — Assessment & Plan Note (Signed)
At goal today, repeat BMP given recent medication changes  Discussed sodium bicarbonate therapy at length

## 2021-03-21 NOTE — Progress Notes (Signed)
° ° °  SUBJECTIVE:   CHIEF COMPLAINT: neck pain  HPI:   Tammy [name] is a 66 y.o.  with history notable for chronic kidney disease stage IIIb, hypertension, tobacco abuse and coronary disease presenting for left neck pain intermittently.  She reports last night she had some left neck pain.  This was painful to touch.  This resolved with Flexeril.  She denies chest pain, left arm pain, difficulty breathing.  She is not sure of the cause and denies any injury.  No arm weakness or numbness.  Patient reports compliance with her medications.  She has multiple questions about the sodium bicarbonate.  She has several questions about her visit with Dr. Royce Macadamia.  The patient is taking her medications as prescribed.  She is still smoking between a half a pack and a pack per day.  She is thinking about cutting down in the future.  She needs her low-dose CT rescheduled due to transportation and other concerns about the scheduling of this.  PERTINENT  PMH / PSH/Family/Social History : updated   OBJECTIVE:   BP 128/82    Pulse (!) 53    Wt 267 lb (121.1 kg)    BMI 43.09 kg/m   Today's weight:  Last Weight  Most recent update: 03/21/2021  9:41 AM    Weight  121.1 kg (267 lb)            Review of prior weights: [employer]   03/21/21 0941  Weight: 267 lb (121.1 kg)   Pleasant appearing woman in no distress. Dentition is somewhat poor Bilateral neck examined she is good range of motion both of her arms but does have tenderness along the left trapezius.  No midline C-spine tenderness.  She is good range of motion of her neck.  Regular rate and rhythm no murmurs rubs or gallops.  Lungs clear bilaterally.   ASSESSMENT/PLAN:   PAD (peripheral artery disease) (HCC) Continue Ezetimibe and statin--repeat Lipids at follow up   Essential hypertension At goal today, repeat BMP given recent medication changes  Discussed sodium bicarbonate therapy at length   CAD (coronary artery  disease) Continue beta-blocker, statin, P2Y12 inhibitor   TOBACCO DEPENDENCE Encouraged cessation-pre contemplative  Number given for patient for low dose CT    Left neck pain possibly due to degenerative disc disease of the neck versus trapezius spasm.  Will obtain imaging.  Consider very low-dose of muscle relaxer, recommend massage pending imaging.  Low suspicion for referred pain of cardiac etiology as not with exertion, was worsened by pressure and improved with muscle relaxer.  Healthcare maintenance prescription for tetanus given.  Obesity patient continues to be interested in potential weight loss medication.  Will route note to pharmacy to see if this could be covered through patient assistance.     Dorris Singh, [place]

## 2021-03-21 NOTE — Assessment & Plan Note (Signed)
Continue Ezetimibe and statin--repeat Lipids at follow up

## 2021-03-21 NOTE — Assessment & Plan Note (Signed)
Encouraged cessation pre contemplative  

## 2021-03-22 ENCOUNTER — Telehealth: Payer: Self-pay | Admitting: Family Medicine

## 2021-03-22 LAB — BASIC METABOLIC PANEL
BUN/Creatinine Ratio: 15 (ref 12–28)
BUN: 28 mg/dL — ABNORMAL HIGH (ref 8–27)
CO2: 18 mmol/L — ABNORMAL LOW (ref 20–29)
Calcium: 10.1 mg/dL (ref 8.7–10.3)
Chloride: 105 mmol/L (ref 96–106)
Creatinine, Ser: 1.82 mg/dL — ABNORMAL HIGH (ref 0.57–1.00)
Glucose: 110 mg/dL — ABNORMAL HIGH (ref 70–99)
Potassium: 4.6 mmol/L (ref 3.5–5.2)
Sodium: 139 mmol/L (ref 134–144)
eGFR: 31 mL/min/{1.73_m2} — ABNORMAL LOW (ref >59)

## 2021-03-22 NOTE — Telephone Encounter (Signed)
Called patient with results. K appropriate on losartan. Has not yet started bicarbonate.   [name], MD  Family Medicine Teaching Service

## 2021-03-28 ENCOUNTER — Telehealth: Payer: Self-pay | Admitting: Family Medicine

## 2021-03-28 NOTE — Telephone Encounter (Signed)
Called patient about Ozempic. All questions answered.  Dorris Singh, MD  Family Medicine Teaching Service

## 2021-04-04 ENCOUNTER — Ambulatory Visit (HOSPITAL_COMMUNITY): Payer: Medicaid Other | Attending: Family Medicine

## 2021-04-17 ENCOUNTER — Other Ambulatory Visit: Payer: Self-pay

## 2021-04-17 MED ORDER — AMLODIPINE BESYLATE 10 MG PO TABS: 10.0000 mg | ORAL_TABLET | Freq: Every day | ORAL | 3 refills | Status: DC

## 2021-04-21 ENCOUNTER — Telehealth: Payer: Self-pay | Admitting: Family Medicine

## 2021-04-21 NOTE — Telephone Encounter (Signed)
.. °  Medicaid Managed Care   Unsuccessful Outreach Note  04/21/2021 Name: ILCE HAVINS MRN: [id number] DOB: November 08, 1955  Referred by: Martyn Malay, MD Reason for referral : High Risk Managed Medicaid (I called the patient today to get her scheduled with the MM Team. I left my name and number on her VM.)   An unsuccessful telephone outreach was attempted today. The patient was referred to the case management team for assistance with care management and care coordination.   Follow Up Plan: The care management team will reach out to the patient again over the next 14 days.   [place]

## 2021-05-19 ENCOUNTER — Telehealth: Payer: Self-pay | Admitting: Family Medicine

## 2021-05-19 NOTE — Telephone Encounter (Signed)
.. ?  Medicaid Managed Care  ? ?Unsuccessful Outreach Note ? ?05/19/2021 ?Name: Tammy [name] MRN: [id number] DOB: [birth year] ? ?Referred by: Westley Chandler, MD ?Reason for referral : High Risk Managed Medicaid (I called the patient today to get her scheduled with the MM Team. I left my name and number on her VM.) ? ? ?A second unsuccessful telephone outreach was attempted today. The patient was referred to the case management team for assistance with care management and care coordination.  ? ?Follow Up Plan: The care management team will reach out to the patient again over the next 14 days.  ? ?Weston Settle ?Care Guide, High Risk Medicaid Managed Care ?Embedded Care Coordination ?[place]  [facility]  ? ? ? ?

## 2021-05-23 ENCOUNTER — Telehealth: Payer: Self-pay | Admitting: *Deleted

## 2021-05-23 ENCOUNTER — Ambulatory Visit: Payer: Self-pay

## 2021-05-23 NOTE — Patient Instructions (Signed)
[place] F[place] ,  ? ?The [facility] Managed Care Team is available to provide assistance to you with your healthcare needs at no cost and as a benefit of your [facility] Health plan. I'm sorry I was unable to reach you today for our scheduled appointment. Our care guide will call you to reschedule our telephone appointment. Please call me at the number below. I am available to be of assistance to you regarding your healthcare needs. .  ? ?Thank you,  ? ?Cranford Mon RN, CCM, CDCES ?[place]  [facility] ?Care Management Coordinator - Managed Medicaid High Risk ?[phone]  ?

## 2021-05-23 NOTE — Patient Outreach (Signed)
Care Coordination ? ?05/23/2021 ? ?[place] Fus ?Nov 09, 1955 ?[id number] ? ?05/23/2021 ?Name: Tammy [name] MRN: [id number] DOB: [birth year] ? ?Referred by: Westley Chandler, MD ?Reason for referral : High Risk Managed Medicaid (Unsuccessful Initial RNCM telephone outreach) ? ? ?An unsuccessful telephone outreach was attempted today. The patient was referred to the case management team for assistance with care management and care coordination.   ? ?Follow Up Plan: A HIPAA compliant phone message was left for the patient providing contact information and requesting a return call. and The Managed Medicaid care management team will reach out to the patient again over the next 7 days.  ? ? ?Cranford Mon RN, CCM, CDCES ?[place]  [facility] ?Care Management Coordinator - Managed Medicaid High Risk ?[phone]  ?

## 2021-05-26 ENCOUNTER — Telehealth: Payer: Self-pay | Admitting: Family Medicine

## 2021-05-26 NOTE — Telephone Encounter (Signed)
.. ?  Medicaid Managed Care  ? ?Unsuccessful Outreach Note ? ?05/26/2021 ?Name: Tammy [name] MRN: [id number] DOB: 06/12/1955 ? ?Referred by: Westley Chandler, MD ?Reason for referral : High Risk Managed Medicaid (I called the patient today to get her rescheduled with the MM RNCM. I left my name and number on her VM.) ? ? ?A second unsuccessful telephone outreach was attempted today. The patient was referred to the case management team for assistance with care management and care coordination.  ? ?Follow Up Plan: The care management team will reach out to the patient again over the next 7 days.  ? ? ?Weston Settle ?Care Guide, High Risk Medicaid Managed Care ?Embedded Care Coordination ?  [facility]  ? ? ? ?

## 2021-06-10 DIAGNOSIS — N1832 Chronic kidney disease, stage 3b: Secondary | ICD-10-CM | POA: Diagnosis not present

## 2021-06-20 ENCOUNTER — Ambulatory Visit: Payer: Medicaid Other | Admitting: Family Medicine

## 2021-06-20 ENCOUNTER — Telehealth: Payer: Self-pay | Admitting: *Deleted

## 2021-06-20 ENCOUNTER — Encounter: Payer: Self-pay | Admitting: Family Medicine

## 2021-06-20 ENCOUNTER — Ambulatory Visit (INDEPENDENT_AMBULATORY_CARE_PROVIDER_SITE_OTHER): Payer: Medicaid Other | Admitting: Family Medicine

## 2021-06-20 VITALS — BP 130/80 | HR 61 | Temp 98.2°F | Ht 66.0 in | Wt 269.4 lb

## 2021-06-20 DIAGNOSIS — I2510 Atherosclerotic heart disease of native coronary artery without angina pectoris: Secondary | ICD-10-CM

## 2021-06-20 DIAGNOSIS — F17200 Nicotine dependence, unspecified, uncomplicated: Secondary | ICD-10-CM

## 2021-06-20 DIAGNOSIS — N1832 Chronic kidney disease, stage 3b: Secondary | ICD-10-CM

## 2021-06-20 DIAGNOSIS — I10 Essential (primary) hypertension: Secondary | ICD-10-CM

## 2021-06-20 DIAGNOSIS — I739 Peripheral vascular disease, unspecified: Secondary | ICD-10-CM

## 2021-06-20 DIAGNOSIS — B353 Tinea pedis: Secondary | ICD-10-CM

## 2021-06-20 DIAGNOSIS — M542 Cervicalgia: Secondary | ICD-10-CM

## 2021-06-20 MED ORDER — AMLODIPINE BESYLATE 10 MG PO TABS: 10.0000 mg | ORAL_TABLET | Freq: Every day | ORAL | 3 refills | Status: DC

## 2021-06-20 MED ORDER — LOSARTAN POTASSIUM 25 MG PO TABS: 25.0000 mg | ORAL_TABLET | Freq: Every day | ORAL | 3 refills | Status: DC

## 2021-06-20 MED ORDER — KETOCONAZOLE 2 % EX CREA: 1.0000 "application " | TOPICAL_CREAM | Freq: Every day | CUTANEOUS | 1 refills | Status: AC

## 2021-06-20 MED ORDER — BACLOFEN 5 MG PO TABS
5.0000 mg | ORAL_TABLET | Freq: Every day | ORAL | 1 refills | Status: DC | PRN
Start: 2021-06-20 — End: 2021-11-07

## 2021-06-20 MED ORDER — METOPROLOL TARTRATE 100 MG PO TABS: 100.0000 mg | ORAL_TABLET | Freq: Two times a day (BID) | ORAL | 2 refills | Status: DC

## 2021-06-20 MED ORDER — HYDRALAZINE HCL 50 MG PO TABS: 50.0000 mg | ORAL_TABLET | Freq: Two times a day (BID) | ORAL | 2 refills | Status: DC

## 2021-06-20 MED ORDER — EZETIMIBE 10 MG PO TABS: 10.0000 mg | ORAL_TABLET | Freq: Every evening | ORAL | 3 refills | Status: DC

## 2021-06-20 MED ORDER — SODIUM BICARBONATE 650 MG PO TABS: 650.0000 mg | ORAL_TABLET | Freq: Two times a day (BID) | ORAL | 3 refills | Status: DC

## 2021-06-20 MED ORDER — NICOTINE POLACRILEX 2 MG MT GUM: 2.0000 mg | CHEWING_GUM | OROMUCOSAL | 0 refills | Status: DC | PRN

## 2021-06-20 MED ORDER — CLOPIDOGREL BISULFATE 75 MG PO TABS: 75.0000 mg | ORAL_TABLET | Freq: Every day | ORAL | 3 refills | Status: DC

## 2021-06-20 NOTE — Assessment & Plan Note (Signed)
Rx 90 days for medications ?Discussed side effects of medications.  Could consider addition of Pletal in future to help with walking distance. ?

## 2021-06-20 NOTE — Progress Notes (Signed)
? ? ?  SUBJECTIVE:  ? ?CHIEF COMPLAINT: BP follow up, neck pain  ?HPI:  ? ?Tammy [name] is a 66 y.o.  with history notable for CKD IIIb, hypertension, obesity, and tobacco use  presenting for follow up on BP. ? ?Patient reports intermittent left-sided neck pain sometimes on the right as well this occurs when she gets up.  Is not associate with exertion.  No radiating pain, no numbness or weakness.  No chest pain or dyspnea.  She reports the pain sometimes extends up the left side of her head. Has tried nothing for this. X-ray ordered at last visit, not yet compelted.  ? ?In terms of the patient's blood pressure she reports compliance with her medications.  She denies side effects she prefers 90-day supply due to cost and ease. ? ?Smokes 6 cigarettes per day. Interested in trying to cut back, grandchildren asking her to quit.  ? ?PERTINENT  PMH / PSH/Family/Social History : updated and reviewed  ? ?OBJECTIVE:  ? ?BP 130/80   Pulse 61   Temp 98.2 ?F (36.8 ?C)   Ht 5\' 6"  (1.676 m)   Wt 269 lb 6.4 oz (122.2 kg)   SpO2 95%   BMI 43.48 kg/m?   ?Today's weight:  ?Last Weight  Most recent update: 06/20/2021 10:23 AM  ? ? Weight  ?122.2 kg (269 lb 6.4 oz)  ?      ? ?  ? ?Review of prior weights: ?Filed Weights  ? 06/20/21 1022  ?Weight: 269 lb 6.4 oz (122.2 kg)  ? ?Cardiac: Warm well perfused.  Capillary refill less than 3 seconds ?Respiratory breathing comfortably on room air ?Psych: Pleasant normal affect, appropriate, normal rate of speech ?Skin bilateral feet examined, no ulcers but  + flaking and hyperpigmentation in interdigital area. ?Neck is examined there is no deformity.  There is tenderness to palpation over bilateral trapezius muscles.  No midline tenderness.  Negative Spurling's bilaterally. ? ?ASSESSMENT/PLAN:  ? ?TOBACCO DEPENDENCE ?Encourage patient to schedule low-dose CT.  Number given for scheduling.  Patient has barriers to care including transportation.  Number provided to her case manager  through Medicaid.  Has had a poor experience with Medicaid transportation. ? ?Essential hypertension ?At goal, doing well. BMP today, 90 day supply of medications  ? ?PAD (peripheral artery disease) (HCC) ?Rx 90 days for medications ?Discussed side effects of medications.  Could consider addition of Pletal in future to help with walking distance. ?  ?Neck pain-consistent with muscle spasm, will try baclofen.  She has not been using any muscle relaxer for this.  She can use Tylenol as well.  Discussed ice heat and massaging.  X-ray ordered to look for osteoarthritis of the cervical spine.Low suspicion for referred cardiac pain (non-exertional, bilateral, only in AM).  ? ?Tobacco use- Rx nicotine gum, reviewed how to use with patient.  Encourage cessation.  Discussed benefits of cessation. ? ?Tinea pedis- most likely, Rx ketoconazole  ? ? ? ?06/22/21, MD  ?Family Medicine Teaching Service  ?[facility]  ? ? ?

## 2021-06-20 NOTE — Patient Outreach (Signed)
Care Coordination ? ?06/20/2021 ? ?Hildred Priest ?[birth year] ?[id number] ? ?Patient left voice message at her primary care provider's direction, for this RNCM, apologizing for prior missed calls to complete the initial telephone assessment for the Medicaid Managed - [place] Management /Care Coordination Program. ? ?Plan: ?Notified scheduling care guide Reita Chard and she will contact patient to schedule the initial assessment with another managed Medicaid RNCM. ? ?Kelli Churn RN, CCM, CDCES ?[facility] ?Care Management Coordinator - Managed Medicaid High Risk ?[phone]  ? ?  ? ?

## 2021-06-20 NOTE — Patient Instructions (Addendum)
It was wonderful to see you today. ? ?Please bring ALL of your medications with you to every visit.  ? ?Today we talked about: ? ?Feet- Use ketoconazole twice a day for a week between toes ? ? ?Neck- try ice and heat. Use baclofen once a day. Treat your self to a massage! ? ?Smoking--I sent in gum--remember, chew until the peppery taste then park in your gum--then chew again til you taste the peppery taste ? ?An x-ray was ordered for you---you do not need an appointment to have this completed. ? ?I recommend going to [facility] ?[address] [place] ?OR [address] [place]  ? ?If the results are normal,I will send you a letter ? ?I will call you with results if anything is abnormal  ? ? ?Medicaid Case Manager--call her to schedule a visit with her to help ?Cranford Mon, RN ?[phone]  ? ?For your CAT scan of your lungs ?Call [phone] ? ?Thank you for choosing [facility].  ? ?Please call [phone] with any questions about today's appointment. ? ?Please be sure to schedule follow up at the front  desk before you leave today.  ? ?[name], MD  ?Family Medicine  ? ?

## 2021-06-20 NOTE — Assessment & Plan Note (Signed)
At goal, doing well. BMP today, 90 day supply of medications  ?

## 2021-06-20 NOTE — Assessment & Plan Note (Addendum)
Encourage patient to schedule low-dose CT.  Number given for scheduling.  Patient has barriers to care including transportation.  Number provided to her case manager through Medicaid.  Has had a poor experience with Medicaid transportation. ?

## 2021-06-21 LAB — BASIC METABOLIC PANEL
BUN/Creatinine Ratio: 11 — ABNORMAL LOW (ref 12–28)
BUN: 15 mg/dL (ref 8–27)
CO2: 16 mmol/L — ABNORMAL LOW (ref 20–29)
Calcium: 9.6 mg/dL (ref 8.7–10.3)
Chloride: 103 mmol/L (ref 96–106)
Creatinine, Ser: 1.38 mg/dL — ABNORMAL HIGH (ref 0.57–1.00)
Glucose: 123 mg/dL — ABNORMAL HIGH (ref 70–99)
Potassium: 4.5 mmol/L (ref 3.5–5.2)
Sodium: 139 mmol/L (ref 134–144)
eGFR: 43 mL/min/{1.73_m2} — ABNORMAL LOW (ref >59)

## 2021-06-21 LAB — LIPID PANEL
Chol/HDL Ratio: 2.6 ratio (ref 0.0–4.4)
Cholesterol, Total: 175 mg/dL (ref 100–199)
HDL: 68 mg/dL (ref >39)
LDL Chol Calc (NIH): 90 mg/dL (ref 0–99)
Triglycerides: 96 mg/dL (ref 0–149)
VLDL Cholesterol Cal: 17 mg/dL (ref 5–40)

## 2021-07-31 ENCOUNTER — Other Ambulatory Visit: Payer: Self-pay | Admitting: Nephrology

## 2021-07-31 DIAGNOSIS — N2581 Secondary hyperparathyroidism of renal origin: Secondary | ICD-10-CM

## 2021-07-31 DIAGNOSIS — I2510 Atherosclerotic heart disease of native coronary artery without angina pectoris: Secondary | ICD-10-CM

## 2021-07-31 DIAGNOSIS — N281 Cyst of kidney, acquired: Secondary | ICD-10-CM

## 2021-07-31 DIAGNOSIS — Z720 Tobacco use: Secondary | ICD-10-CM

## 2021-07-31 DIAGNOSIS — E872 Acidosis, unspecified: Secondary | ICD-10-CM

## 2021-07-31 DIAGNOSIS — E559 Vitamin D deficiency, unspecified: Secondary | ICD-10-CM

## 2021-07-31 DIAGNOSIS — I129 Hypertensive chronic kidney disease with stage 1 through stage 4 chronic kidney disease, or unspecified chronic kidney disease: Secondary | ICD-10-CM

## 2021-07-31 DIAGNOSIS — N1831 Chronic kidney disease, stage 3a: Secondary | ICD-10-CM

## 2021-08-08 ENCOUNTER — Other Ambulatory Visit: Payer: Medicaid Other

## 2021-08-12 ENCOUNTER — Other Ambulatory Visit: Payer: Self-pay | Admitting: Family Medicine

## 2021-08-12 DIAGNOSIS — Z1231 Encounter for screening mammogram for malignant neoplasm of breast: Secondary | ICD-10-CM

## 2021-08-28 ENCOUNTER — Telehealth: Payer: Self-pay

## 2021-08-28 DIAGNOSIS — I2510 Atherosclerotic heart disease of native coronary artery without angina pectoris: Secondary | ICD-10-CM

## 2021-08-28 NOTE — Telephone Encounter (Signed)
Patient calls nurse line requesting to speak with Dr. Manson Passey regarding seeing a different cardiologist. Patient was previously seeing Dr. Elease Hashimoto, however, is requesting to see a different provider.   Patient states that she has discussed this with Dr. Manson Passey and she would like her recommendation/ referral to see another provider.   Please advise.   Veronda Prude, RN

## 2021-08-29 NOTE — Addendum Note (Signed)
Addended by: Manson Passey, Jenesis Martin on: 08/29/2021 02:17 PM   Modules accepted: Orders

## 2021-08-29 NOTE — Telephone Encounter (Addendum)
Called and discussed with patient--would prefer female provider. Has not been seen by [facility] care for over 2 years, will send new referral   Tammy Starr, MD  [facility] Medicine Teaching Service

## 2021-09-03 ENCOUNTER — Telehealth: Payer: Self-pay | Admitting: Cardiovascular Disease

## 2021-09-03 NOTE — Telephone Encounter (Signed)
Called patient and provided with information. Patient requested that we reach out to cardiology office with this information.   Sent secure chat to Garald Braver with recommended providers.   Veronda Prude, RN

## 2021-09-03 NOTE — Telephone Encounter (Signed)
Patient returns call to nurse line regarding previous message. Patient states that she spoke with cardiologist this morning and they were asking for the name of specific provider that Dr. Manson Passey wanted her to see.   Please advise if there is a specific provider you would like patient to establish care with.   Thanks.   Veronda Prude, RN

## 2021-09-03 NOTE — Telephone Encounter (Signed)
Patient requesting to switch from Dr. Nahser to Dr. Pemberton. 

## 2021-09-05 NOTE — Telephone Encounter (Signed)
LVM to schedule appt with Dr. Shari Prows per provider switch

## 2021-09-10 ENCOUNTER — Ambulatory Visit
Admission: RE | Admit: 2021-09-10 | Discharge: 2021-09-10 | Disposition: A | Discharge status: Home / Self Care | Payer: Medicare Other | Source: Ambulatory Visit | Attending: Family Medicine | Admitting: Family Medicine

## 2021-09-10 DIAGNOSIS — Z1231 Encounter for screening mammogram for malignant neoplasm of breast: Secondary | ICD-10-CM

## 2021-09-12 ENCOUNTER — Other Ambulatory Visit: Payer: Self-pay | Admitting: Family Medicine

## 2021-09-12 DIAGNOSIS — R928 Other abnormal and inconclusive findings on diagnostic imaging of breast: Secondary | ICD-10-CM

## 2021-09-17 ENCOUNTER — Ambulatory Visit
Admission: RE | Admit: 2021-09-17 | Discharge: 2021-09-17 | Disposition: A | Discharge status: Home / Self Care | Payer: Medicare Other | Source: Ambulatory Visit | Attending: Family Medicine | Admitting: Family Medicine

## 2021-09-17 ENCOUNTER — Ambulatory Visit
Admission: RE | Admit: 2021-09-17 | Discharge: 2021-09-17 | Disposition: A | Discharge status: Home / Self Care | Payer: Medicare Other | Source: Ambulatory Visit | Attending: Nephrology | Admitting: Nephrology

## 2021-09-17 DIAGNOSIS — N2581 Secondary hyperparathyroidism of renal origin: Secondary | ICD-10-CM

## 2021-09-17 DIAGNOSIS — E872 Acidosis, unspecified: Secondary | ICD-10-CM

## 2021-09-17 DIAGNOSIS — M542 Cervicalgia: Secondary | ICD-10-CM

## 2021-09-17 DIAGNOSIS — N1831 Chronic kidney disease, stage 3a: Secondary | ICD-10-CM

## 2021-09-17 DIAGNOSIS — E559 Vitamin D deficiency, unspecified: Secondary | ICD-10-CM

## 2021-09-17 DIAGNOSIS — I129 Hypertensive chronic kidney disease with stage 1 through stage 4 chronic kidney disease, or unspecified chronic kidney disease: Secondary | ICD-10-CM

## 2021-09-17 DIAGNOSIS — I2510 Atherosclerotic heart disease of native coronary artery without angina pectoris: Secondary | ICD-10-CM

## 2021-09-17 DIAGNOSIS — N281 Cyst of kidney, acquired: Secondary | ICD-10-CM

## 2021-09-17 DIAGNOSIS — Z720 Tobacco use: Secondary | ICD-10-CM

## 2021-09-18 ENCOUNTER — Telehealth: Payer: Self-pay | Admitting: Family Medicine

## 2021-09-18 DIAGNOSIS — M47812 Spondylosis without myelopathy or radiculopathy, cervical region: Secondary | ICD-10-CM

## 2021-09-18 NOTE — Telephone Encounter (Signed)
Called with results. Discussed ultrasound, xray discussed. Referred to Orthopedics.

## 2021-09-22 ENCOUNTER — Ambulatory Visit (HOSPITAL_COMMUNITY)
Admission: RE | Admit: 2021-09-22 | Discharge: 2021-09-22 | Disposition: A | Discharge status: Home / Self Care | Payer: Medicare Other | Source: Ambulatory Visit | Attending: Family Medicine | Admitting: Family Medicine

## 2021-09-22 DIAGNOSIS — I2510 Atherosclerotic heart disease of native coronary artery without angina pectoris: Secondary | ICD-10-CM | POA: Diagnosis not present

## 2021-09-22 DIAGNOSIS — I700 Atherosclerosis of aorta: Secondary | ICD-10-CM | POA: Insufficient documentation

## 2021-09-22 DIAGNOSIS — I10 Essential (primary) hypertension: Secondary | ICD-10-CM | POA: Diagnosis not present

## 2021-09-22 DIAGNOSIS — Z122 Encounter for screening for malignant neoplasm of respiratory organs: Secondary | ICD-10-CM | POA: Insufficient documentation

## 2021-09-22 DIAGNOSIS — J439 Emphysema, unspecified: Secondary | ICD-10-CM | POA: Insufficient documentation

## 2021-09-22 DIAGNOSIS — Z720 Tobacco use: Secondary | ICD-10-CM | POA: Insufficient documentation

## 2021-09-22 DIAGNOSIS — F17210 Nicotine dependence, cigarettes, uncomplicated: Secondary | ICD-10-CM | POA: Insufficient documentation

## 2021-09-23 ENCOUNTER — Other Ambulatory Visit: Payer: Self-pay | Admitting: Family Medicine

## 2021-09-23 ENCOUNTER — Ambulatory Visit
Admission: RE | Admit: 2021-09-23 | Discharge: 2021-09-23 | Disposition: A | Discharge status: Home / Self Care | Payer: Medicare Other | Source: Ambulatory Visit | Attending: Family Medicine | Admitting: Family Medicine

## 2021-09-23 ENCOUNTER — Encounter: Payer: Self-pay | Admitting: Family Medicine

## 2021-09-23 DIAGNOSIS — R928 Other abnormal and inconclusive findings on diagnostic imaging of breast: Secondary | ICD-10-CM

## 2021-09-23 DIAGNOSIS — I288 Other diseases of pulmonary vessels: Secondary | ICD-10-CM | POA: Insufficient documentation

## 2021-09-23 DIAGNOSIS — N6321 Unspecified lump in the left breast, upper outer quadrant: Secondary | ICD-10-CM

## 2021-09-23 NOTE — Progress Notes (Unsigned)
Cardiology Office Note:    Date:  09/25/2021   ID:  TAMMIE [name], DOB 01-18-1956, MRN [id number]  PCP:  Westley Chandler, MD   [facility] Providers Cardiologist:  Kristeen Miss, MD   Referring MD: Westley Chandler, MD    History of Present Illness:    DANAIJA [name] is a 66 y.o. female with a hx of  hypertension, hyperlipidemia, PAD, CAD s/p NSTEMI (medically managed), obesity and tobacco abuse who was previously followed by Dr. Elease Hashimoto who now presents to clinic for follow-up.  Patient was admitted in 07/2014 with chest pain and elevated troponin. She underwent LHC which showed mild-to-moderate nonobstructive disease and she was managed medically.  Last saw Dr. Elease Hashimoto on 10/2019 where her BP remained elevated. Continued to smoke. She followed up with Dr. Duke Salvia where BP was better controlled and no new meds were needed.  Today, the patient overall feels well. She has not yet taken her medications this morning but she states that when she takes her meds her BP is well controlled at 120-130s at home. She otherwise feels well. No chest pain, SOB, lightheadedness, dizziness or syncope. Does not know is she snores and denies any significant daytime somnolence. Admits that she is not as active as she should be. Has pain in her legs with ambulation in the setting of known PAD. Does not want further intervention on this and would like to continue with medical management.   Past Medical History:  Diagnosis Date   Abnormal mammogram 10.25.11   area of density with several adjacent amorphous calcifications located laterally w/in the right breast at approx 9-10 o'clock position likely represents area of evolving far necrosis; f/u mamo and U/S in 6 months   CAD (coronary artery disease)    a. 07/2014 NSTEMI/Cath: LM nl, LAD 30ost/m, 60d, LCX 20p, OM1 20, OM2 40, OM3 50, RCA 30p, 17m, 20d, RPDA 40, EF 65%-->Med Rx;  b. 07/2014 Echo: EF 55-60%, no rwma, mildly dil LA.   Chronic kidney  disease    Essential hypertension    GERD (gastroesophageal reflux disease)    Hyperlipidemia    Morbid obesity (HCC)    PAD (peripheral artery disease) (HCC)    a. 07/2014 ABI's: R 0.88, L 0.32.   Ulcer     Past Surgical History:  Procedure Laterality Date   BREAST BIOPSY Left 09/01/2018   CARDIAC CATHETERIZATION N/A 07/16/2014   Procedure: Left Heart Cath and Coronary Angiography;  Surgeon: Kathleene Hazel, MD;  Location: [facility] INVASIVE CV LAB;  Service: Cardiovascular;  Laterality: N/A;   CESAREAN SECTION     PERIPHERAL VASCULAR CATHETERIZATION N/A 02/12/2015   Procedure: Abdominal Aortogram;  Surgeon: Nada Libman, MD;  Location: [facility] INVASIVE CV LAB;  Service: Cardiovascular;  Laterality: N/A;   TRIGGER FINGER RELEASE Left    middle finger   UPPER GASTROINTESTINAL ENDOSCOPY  1991    Current Medications: Current Meds  Medication Sig   amLODipine (NORVASC) 10 MG tablet Take 1 tablet (10 mg total) by mouth daily.   aspirin 81 MG EC tablet Take 1 tablet (81 mg total) by mouth daily.   baclofen 5 MG TABS Take 5 mg by mouth daily as needed for muscle spasms.   clindamycin (CLEOCIN-T) 1 % external solution Apply topically 2 (two) times daily. Apply to lesion on mons pubis twice daily for 1 week   clopidogrel (PLAVIX) 75 MG tablet Take 1 tablet (75 mg total) by mouth daily.   cyclobenzaprine (FLEXERIL) 5  MG tablet Take 5 mg by mouth 3 (three) times daily as needed.   ezetimibe (ZETIA) 10 MG tablet Take 1 tablet (10 mg total) by mouth at bedtime.   hydrALAZINE (APRESOLINE) 50 MG tablet Take 1 tablet (50 mg total) by mouth in the morning and at bedtime.   losartan (COZAAR) 25 MG tablet Take 1 tablet (25 mg total) by mouth daily.   metoprolol tartrate (LOPRESSOR) 100 MG tablet Take 1 tablet (100 mg total) by mouth 2 (two) times daily.   nicotine polacrilex (NICORETTE) 2 MG gum Take 1 each (2 mg total) by mouth as needed for smoking cessation.   nitroGLYCERIN (NITROSTAT) 0.4 MG SL  tablet Place 1 tablet (0.4 mg total) under the tongue every 5 (five) minutes as needed for chest pain.   rosuvastatin (CRESTOR) 40 MG tablet Take 1 tablet (40 mg total) by mouth daily.   sodium bicarbonate 650 MG tablet Take 1 tablet (650 mg total) by mouth 2 (two) times daily.   Vitamin D, Ergocalciferol, (DRISDOL) 1.25 MG (50000 UNIT) CAPS capsule Take 50,000 Units by mouth once a week.   [DISCONTINUED] rosuvastatin (CRESTOR) 20 MG tablet Take 1 tablet (20 mg total) by mouth at bedtime.   Current Facility-Administered Medications for the 09/25/21 encounter (Office Visit) with Meriam Sprague, MD  Medication   acetaminophen (TYLENOL) tablet 975 mg     Allergies:   Ace inhibitors, Hydrochlorothiazide, and Ezetimibe   Social History   Socioeconomic History   Marital status: Single    Spouse name: Not on file   Number of children: Not on file   Years of education: Not on file   Highest education level: Not on file  Occupational History   Not on file  Tobacco Use   Smoking status: Every Day    Packs/day: 0.25    Years: 30.00    Total pack years: 7.50    Types: Cigarettes   Smokeless tobacco: Never   Tobacco comments:    8-10 cigarettes per day  Substance and Sexual Activity   Alcohol use: No    Alcohol/week: 0.0 standard drinks of alcohol   Drug use: No   Sexual activity: Not Currently    Birth control/protection: Post-menopausal, Abstinence  Other Topics Concern   Not on file  Social History Narrative   Not on file   Social Determinants of Health   Financial Resource Strain: Not on file  Food Insecurity: Not on file  Transportation Needs: Not on file  Physical Activity: Not on file  Stress: Not on file  Social Connections: Not on file     Family History: The patient's family history includes Breast cancer in her maternal aunt; Cancer in her maternal aunt and mother; Diabetes in her father and mother; Hypertension in her brother, father, and sister; Ovarian  cancer in her maternal aunt.  ROS:   Please see the history of present illness.     All other systems reviewed and are negative.  EKGs/Labs/Other Studies Reviewed:    The following studies were reviewed today: LHC 2014-06-13: Prox RCA lesion, 30% stenosed. Mid RCA lesion, 60% stenosed. RPDA lesion, 40% stenosed. Post Atrio lesion, 20% stenosed. Dist RCA lesion, 20% stenosed. Prox Cx to Mid Cx lesion, 20% stenosed. 1st Mrg lesion, 20% stenosed. 2nd Mrg lesion, 40% stenosed. Ost 3rd Mrg to 3rd Mrg lesion, 50% stenosed. Ost LAD to Prox LAD lesion, 30% stenosed. Mid LAD lesion, 30% stenosed. Dist LAD lesion, 60% stenosed.   1. Diffuse moderate triple vessel  CAD without focal culprit lesion for NSTEMI.  2. Moderate stenosis mid RCA which does not appear to be a ruptured plaque and is not flow limiting 3. Moderate stenosis in the small caliber distal LAD which does not appear to be a ruptured plaque and is not flow limiting. 4. Moderate disease in the small caliber third obtuse marginal branch 5. Normal LV function 6. NSTEMI with diffuse moderate CAD but no focal culprit lesion   Recommendations: Will manage with medical therapy. I will load with Plavix today. She will need at least 3 months of dual anti-platelet therapy with ASA and Plavix given presentation as acute coronary syndrome. Continue beta blocker and statin. Aggressive risk factor modification including weight loss, diet changes, control of lipids and BP. Would monitor x 12-24 more hours. Follow up after discharge with Dr. Nelson.   CT Chest 09/22/21: FINDINGS: Cardiovascular: Aortic atherosclerosis. Mild cardiomegaly, without pericardial effusion. Three vessel coronary artery calcification.   Pulmonary artery enlargement, outflow tract 3.5 cm.   Mediastinum/Nodes: No mediastinal or definite hilar adenopathy, given limitations of unenhanced CT.   Lungs/Pleura: No pleural fluid. Mild centrilobular emphysema. Isolated left  lower lobe pulmonary nodule of volume derived equivalent diameter 4.5 mm.   Upper Abdomen: Nonspecific caudate lobe enlargement. Normal imaged portions of the spleen, stomach, pancreas, gallbladder, adrenal glands, kidneys.   Musculoskeletal: Lower thoracic spondylosis.   IMPRESSION: 1. Lung-RADS 2, benign appearance or behavior. Continue annual screening with low-dose chest CT without contrast in 12 months. 2. Pulmonary artery enlargement suggests pulmonary arterial hypertension. 3. Aortic Atherosclerosis (ICD10-I70.0) and Emphysema (ICD10-J43.9). 4. Age advanced coronary artery atherosclerosis. Recommend assessment of coronary risk factors.  EKG:  EKG is  ordered today.  The ekg ordered today demonstrates sinus bradycardia with sinus arrhythmia  Recent Labs: 10/07/2020: ALT 7 02/10/2021: Hemoglobin 11.7; Platelets 216 06/20/2021: BUN 15; Creatinine, Ser 1.38; Potassium 4.5; Sodium 139  Recent Lipid Panel    Component Value Date/Time   CHOL 175 06/20/2021 1112   TRIG 96 06/20/2021 1112   HDL 68 06/20/2021 1112   CHOLHDL 2.6 06/20/2021 1112   CHOLHDL 4.3 07/15/2014 0600   VLDL 23 07/15/2014 0600   LDLCALC 90 06/20/2021 1112   LDLDIRECT 100 (H) 02/10/2021 1112     Risk Assessment/Calculations:           Physical Exam:    VS:  BP 140/86   Pulse (!) 46   Ht 5' 6" (1.676 m)   Wt 271 lb 12.8 oz (123.3 kg)   SpO2 97%   BMI 43.87 kg/m     Wt Readings from Last 3 Encounters:  09/25/21 271 lb 12.8 oz (123.3 kg)  06/20/21 269 lb 6.4 oz (122.2 kg)  03/21/21 267 lb (121.1 kg)     GEN:  Well nourished, well developed in no acute distress HEENT: Normal NECK: No JVD; No carotid bruits CARDIAC: RRR, no murmurs, rubs, gallops RESPIRATORY:  Scattered expiratory wheezing ABDOMEN: Obese, soft, NTTP MUSCULOSKELETAL:  No edema; No deformity  SKIN: Warm and dry NEUROLOGIC:  Alert and oriented x 3 PSYCHIATRIC:  Normal affect   ASSESSMENT:    1. Coronary artery disease  involving native coronary artery of native heart without angina pectoris   2. Essential hypertension, benign   3. Mixed hyperlipidemia   4. Daytime sleepiness   5. PAD (peripheral artery disease) (HCC)   6. Essential hypertension   7. TOBACCO DEPENDENCE   8. Morbid obesity (HCC)    PLAN:    In order of problems   listed above:  #History of NSTEMI: #Mild-to-Moderate CAD: Cath in 2016 with mild-to-moderate disease and was managed medically. Currently doing well with no chest pain although she is minimally ambulatory. She is working with her PCP on diet and exercise and is motivated to continue. Will check TTE as below given concern for pulmonary HTN.  -Continue ASA 81mg  daily -Continue zetia 10mg  daily -Continue crestor 20mg  daily -Continue lifestyle modifications  #Suspected Pulmonary HTN: PA artery enlarged at 3.5cm on recent CT with concern for pulmonary HTN. Likely related to COPD in the setting of tobacco abuse as well as obesity.  Will check TTE for further evaluation and evaluation of RV function as well as sleep study to evaluate for OSA. Patient is also scheduled to see Pulm. -Check TTE -Check sleep study  #HTN: Better controlled.  -Continue amlodipine 10mg  daily -Continue losartan 25mg  daily -Continue metop 100mg  BID -Continue hydralazine 50mg  BID  #HLD: -Continue zetia 10mg  daily -Increase crestor to 40mg  daily -Repeat lipids in 8weeks  #PAD: Discussed aortogram results from 2016. Still with claudication symptoms but patient does not wish to pursue intervention/stenting/surgery. Will continue with medical therapy. Encouraged her to increase walking as well. -Does not want interventions at this time and wishes to continue with medical therapy -Increase walking as tolerated -Continue zetia 10mg  daily -Increase crestor to 40mg  daily -Continue ASA and plavix  #Morbid Obesity: BMI 43. Monitored very closely by PCP. Working on lifestyle modifications. Cannot afford  GLP-1 agonist  Exercise recommendations: Goal of exercising for at least 30 minutes a day, at least 5 times per week.  Please exercise to a moderate exertion.  This means that while exercising it is difficult to speak in full sentences, however you are not so short of breath that you feel you must stop, and not so comfortable that you can carry on a full conversation.  Exertion level should be approximately a 5/10, if 10 is the most exertion you can perform.  Diet recommendations: Recommend a heart healthy diet such as the Mediterranean diet.  This diet consists of plant based foods, healthy fats, lean meats, olive oil.  It suggests limiting the intake of simple carbohydrates such as white breads, pastries, and pastas.  It also limits the amount of red meat, wine, and dairy products such as cheese that one should consume on a daily basis.      Medication Adjustments/Labs and Tests Ordered: Current medicines are reviewed at length with the patient today.  Concerns regarding medicines are outlined above.  Orders Placed This Encounter  Procedures   ALT   Lipid panel   EKG 12-Lead   ECHOCARDIOGRAM COMPLETE   Home sleep test   Meds ordered this encounter  Medications   rosuvastatin (CRESTOR) 40 MG tablet    Sig: Take 1 tablet (40 mg total) by mouth daily.    Dispense:  90 tablet    Refill:  3    Patient Instructions  Medication Instructions:  Your physician has recommended you make the following change in your medication: 1) INCREASE Crestor (rosuvastatin) to 40 mg daily *If you need a refill on your cardiac medications before your next appointment, please call your pharmacy*   Lab Work: IN 6 WEEKS: Fasting lipids and ALT If you have labs (blood work) drawn today and your tests are completely normal, you will receive your results only by: MyChart Message (if you have MyChart) OR A paper copy in the mail If you have any lab test that is abnormal or we need  to change your treatment,  we will call you to review the results.  Testing/Procedures: Your physician has requested that you have an echocardiogram. Echocardiography is a painless test that uses sound waves to create images of your heart. It provides your doctor with information about the size and shape of your heart and how well your heart's chambers and valves are working. This procedure takes approximately one hour. There are no restrictions for this procedure.  Your physician has recommended that you have a sleep study. This test records several body functions during sleep, including: brain activity, eye movement, oxygen and carbon dioxide blood levels, heart rate and rhythm, breathing rate and rhythm, the flow of air through your mouth and nose, snoring, body muscle movements, and chest and belly movement.  Follow-Up: At [facility], you and your health needs are our priority.  As part of our continuing mission to provide you with exceptional heart care, we have created designated Provider Care Teams.  These Care Teams include your primary Cardiologist (physician) and Advanced Practice Providers (APPs -  Physician Assistants and Nurse Practitioners) who all work together to provide you with the care you need, when you need it.  Your next appointment:   6 month(s)  The format for your next appointment:   In Person  Provider:   Laurance Flatten, MD   Important Information About Sugar         Signed, Meriam Sprague, MD  09/25/2021 10:58 AM    [facility]

## 2021-09-25 ENCOUNTER — Encounter: Payer: Self-pay | Admitting: Cardiology

## 2021-09-25 ENCOUNTER — Ambulatory Visit (INDEPENDENT_AMBULATORY_CARE_PROVIDER_SITE_OTHER): Payer: Medicare Other | Admitting: Cardiology

## 2021-09-25 VITALS — BP 140/86 | HR 46 | Ht 66.0 in | Wt 271.8 lb

## 2021-09-25 DIAGNOSIS — I10 Essential (primary) hypertension: Secondary | ICD-10-CM | POA: Diagnosis not present

## 2021-09-25 DIAGNOSIS — F17200 Nicotine dependence, unspecified, uncomplicated: Secondary | ICD-10-CM

## 2021-09-25 DIAGNOSIS — R400 Somnolence: Secondary | ICD-10-CM

## 2021-09-25 DIAGNOSIS — E782 Mixed hyperlipidemia: Secondary | ICD-10-CM

## 2021-09-25 DIAGNOSIS — I739 Peripheral vascular disease, unspecified: Secondary | ICD-10-CM

## 2021-09-25 DIAGNOSIS — I2510 Atherosclerotic heart disease of native coronary artery without angina pectoris: Secondary | ICD-10-CM | POA: Diagnosis not present

## 2021-09-25 MED ORDER — ROSUVASTATIN CALCIUM 40 MG PO TABS: 40.0000 mg | ORAL_TABLET | Freq: Every day | ORAL | 3 refills | Status: DC

## 2021-09-25 NOTE — Patient Instructions (Signed)
Medication Instructions:  Your physician has recommended you make the following change in your medication: 1) INCREASE Crestor (rosuvastatin) to 40 mg daily *If you need a refill on your cardiac medications before your next appointment, please call your pharmacy*   Lab Work: IN 6 WEEKS: Fasting lipids and ALT If you have labs (blood work) drawn today and your tests are completely normal, you will receive your results only by: MyChart Message (if you have MyChart) OR A paper copy in the mail If you have any lab test that is abnormal or we need to change your treatment, we will call you to review the results.  Testing/Procedures: Your physician has requested that you have an echocardiogram. Echocardiography is a painless test that uses sound waves to create images of your heart. It provides your doctor with information about the size and shape of your heart and how well your heart's chambers and valves are working. This procedure takes approximately one hour. There are no restrictions for this procedure.  Your physician has recommended that you have a sleep study. This test records several body functions during sleep, including: brain activity, eye movement, oxygen and carbon dioxide blood levels, heart rate and rhythm, breathing rate and rhythm, the flow of air through your mouth and nose, snoring, body muscle movements, and chest and belly movement.  Follow-Up: At [facility], you and your health needs are our priority.  As part of our continuing mission to provide you with exceptional heart care, we have created designated Provider Care Teams.  These Care Teams include your primary Cardiologist (physician) and Advanced Practice Providers (APPs -  Physician Assistants and Nurse Practitioners) who all work together to provide you with the care you need, when you need it.  Your next appointment:   6 month(s)  The format for your next appointment:   In Person  Provider:   Laurance Flatten, MD   Important Information About Sugar

## 2021-10-06 ENCOUNTER — Other Ambulatory Visit: Payer: Self-pay

## 2021-10-06 ENCOUNTER — Ambulatory Visit (HOSPITAL_COMMUNITY)
Admission: EM | Admit: 2021-10-06 | Discharge: 2021-10-06 | Disposition: A | Discharge status: Home / Self Care | Payer: Medicare Other | Attending: Physician Assistant | Admitting: Physician Assistant

## 2021-10-06 ENCOUNTER — Telehealth: Payer: Self-pay | Admitting: Cardiology

## 2021-10-06 ENCOUNTER — Encounter (HOSPITAL_COMMUNITY): Payer: Self-pay | Admitting: *Deleted

## 2021-10-06 DIAGNOSIS — T783XXA Angioneurotic edema, initial encounter: Secondary | ICD-10-CM

## 2021-10-06 DIAGNOSIS — E782 Mixed hyperlipidemia: Secondary | ICD-10-CM

## 2021-10-06 DIAGNOSIS — I2510 Atherosclerotic heart disease of native coronary artery without angina pectoris: Secondary | ICD-10-CM

## 2021-10-06 MED ORDER — DEXAMETHASONE SODIUM PHOSPHATE 10 MG/ML IJ SOLN: 10.0000 mg | Freq: Once | INTRAMUSCULAR | Status: DC

## 2021-10-06 MED ORDER — DEXAMETHASONE SODIUM PHOSPHATE 10 MG/ML IJ SOLN
10.0000 mg | Freq: Once | INTRAMUSCULAR | Status: AC
  Administered 2021-10-06: 10 mg via INTRAMUSCULAR

## 2021-10-06 MED ORDER — DEXAMETHASONE SODIUM PHOSPHATE 10 MG/ML IJ SOLN
INTRAMUSCULAR | Status: AC
  Filled 2021-10-06: qty 1

## 2021-10-06 NOTE — Discharge Instructions (Addendum)
Discontinue statin Follow up with PCP / cardiologist Take zyrtec daily Take 25 mg benadryl every 6 hours Take Pepcid 20 mg daily

## 2021-10-06 NOTE — ED Provider Notes (Signed)
[facility]    CSN: [id number] Arrival date & time: 10/06/21  1128      History   Chief Complaint Chief Complaint  Patient presents with   Allergic Reaction    HPI Tammy [name] is a 66 y.o. female.   Patient here c/w swelling upper lip x 2 days ago . Started day after increase in rosuvastatin dose to 40 mg.  Chart notes she's on Losartan.  She denies swelling of tongue, throat, coughing, wheezing, SOB.  No changes in foods, soaps, detergents.  She hasn't taken any of her medications today.    Past Medical History:  Diagnosis Date   Abnormal mammogram 10.25.11   area of density with several adjacent amorphous calcifications located laterally w/in the right breast at approx 9-10 o'clock position likely represents area of evolving far necrosis; f/u mamo and U/S in 6 months   CAD (coronary artery disease)    a. 07/2014 NSTEMI/Cath: LM nl, LAD 30ost/m, 60d, LCX 20p, OM1 20, OM2 40, OM3 50, RCA 30p, 100m, 20d, RPDA 40, EF 65%-->Med Rx;  b. 07/2014 Echo: EF 55-60%, no rwma, mildly dil LA.   Chronic kidney disease    Essential hypertension    GERD (gastroesophageal reflux disease)    Hyperlipidemia    Morbid obesity (HCC)    PAD (peripheral artery disease) (HCC)    a. 07/2014 ABI's: R 0.88, L 0.32.   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   Mastalgia 09/23/2020   CKD (chronic kidney disease), stage III (HCC) 05/17/2020   CAD (coronary artery disease)    PAD (peripheral artery disease) (HCC) 07/20/2014   HLD (hyperlipidemia)    Morbid obesity (HCC)    TOBACCO DEPENDENCE 04/29/2006   Essential hypertension 04/29/2006    Past Surgical History:  Procedure Laterality Date   BREAST BIOPSY Left 09/01/2018   CARDIAC CATHETERIZATION N/A 07/16/2014   Procedure: Left Heart Cath and Coronary Angiography;  Surgeon: Kathleene Hazel, MD;  Location: [facility] INVASIVE CV LAB;  Service: Cardiovascular;   Laterality: N/A;   CESAREAN SECTION     PERIPHERAL VASCULAR CATHETERIZATION N/A 02/12/2015   Procedure: Abdominal Aortogram;  Surgeon: Nada Libman, MD;  Location: [facility] INVASIVE [facility] LAB;  Service: Cardiovascular;  Laterality: N/A;   TRIGGER FINGER RELEASE Left    middle finger   UPPER GASTROINTESTINAL ENDOSCOPY  1991    OB History   No obstetric history on file.      Home Medications    Prior to Admission medications   Medication Sig Start Date End Date Taking? Authorizing Provider  amLODipine (NORVASC) 10 MG tablet Take 1 tablet (10 mg total) by mouth daily. 06/20/21   Westley Chandler, MD  aspirin 81 MG EC tablet Take 1 tablet (81 mg total) by mouth daily. 02/01/18   Freddrick March, MD  baclofen 5 MG TABS Take 5 mg by mouth daily as needed for muscle spasms. 06/20/21   Westley Chandler, MD  clindamycin (CLEOCIN-T) 1 % external solution Apply topically 2 (two) times daily. Apply to lesion on mons pubis twice daily for 1 week 11/11/20   Westley Chandler, MD  clopidogrel (PLAVIX) 75 MG tablet Take 1 tablet (75 mg total) by mouth daily. 06/20/21   Westley Chandler, MD  cyclobenzaprine (FLEXERIL) 5 MG tablet Take 5 mg by mouth 3 (three) times daily as needed. 09/08/21   [provider]  ezetimibe (ZETIA)  10 MG tablet Take 1 tablet (10 mg total) by mouth at bedtime. 06/20/21   Westley Chandler, MD  hydrALAZINE (APRESOLINE) 50 MG tablet Take 1 tablet (50 mg total) by mouth in the morning and at bedtime. 06/20/21   Westley Chandler, MD  Insulin Pen Needle 31G X 8 MM MISC Use with KYHCWC Patient not taking: Reported on 09/25/2021 02/10/21   Westley Chandler, MD  ketoconazole (NIZORAL) 2 % cream Apply 1 application. topically daily. Patient not taking: Reported on 09/25/2021 06/20/21   Westley Chandler, MD  losartan (COZAAR) 25 MG tablet Take 1 tablet (25 mg total) by mouth daily. 06/20/21   Westley Chandler, MD  metoprolol tartrate (LOPRESSOR) 100 MG tablet Take 1 tablet (100 mg total) by mouth 2 (two)  times daily. 06/20/21   Westley Chandler, MD  nicotine polacrilex (NICORETTE) 2 MG gum Take 1 each (2 mg total) by mouth as needed for smoking cessation. 06/20/21   Westley Chandler, MD  nitroGLYCERIN (NITROSTAT) 0.4 MG SL tablet Place 1 tablet (0.4 mg total) under the tongue every 5 (five) minutes as needed for chest pain. 11/11/20   Westley Chandler, MD  rosuvastatin (CRESTOR) 40 MG tablet Take 1 tablet (40 mg total) by mouth daily. 09/25/21   Meriam Sprague, MD  Semaglutide,0.25 or 0.5MG /DOS, 2 MG/1.5ML SOPN Inject 0.25 mg into the skin once a week. 0.25 mg once weekly for 4 weeks then increase to 0.5 mg weekly for at least 4 weeks,max 1 mg Patient not taking: Reported on 09/25/2021 02/10/21   Westley Chandler, MD  sodium bicarbonate 650 MG tablet Take 1 tablet (650 mg total) by mouth 2 (two) times daily. 06/20/21 06/20/22  Westley Chandler, MD  Vitamin D, Ergocalciferol, (DRISDOL) 1.25 MG (50000 UNIT) CAPS capsule Take 50,000 Units by mouth once a week. 09/14/21   [provider]    Family History Family History  Problem Relation Age of Onset   Cancer Mother        Bladder cancer, cause of death   Diabetes Mother    Diabetes Father    Hypertension Father    Hypertension Sister    Hypertension Brother    Cancer Maternal Aunt    Breast cancer Maternal Aunt        unsure of age   Ovarian cancer Maternal Aunt     Social History Social History   Tobacco Use   Smoking status: Every Day    Packs/day: 0.25    Years: 30.00    Total pack years: 7.50    Types: Cigarettes   Smokeless tobacco: Never   Tobacco comments:    8-10 cigarettes per day  Substance Use Topics   Alcohol use: No    Alcohol/week: 0.0 standard drinks of alcohol   Drug use: No     Allergies   Ace inhibitors, Hydrochlorothiazide, and Ezetimibe   Review of Systems Review of Systems  Constitutional:  Negative for chills, fatigue and fever.  HENT:  Positive for facial swelling. Negative for congestion, ear  pain, rhinorrhea and sore throat.   Eyes:  Negative for pain and redness.  Respiratory:  Negative for cough, shortness of breath and wheezing.   Gastrointestinal:  Negative for abdominal pain, diarrhea, nausea and vomiting.  Musculoskeletal:  Negative for arthralgias and myalgias.  Skin:  Negative for rash.  Neurological:  Negative for light-headedness and headaches.  Hematological:  Negative for adenopathy. Does not bruise/bleed easily.  Psychiatric/Behavioral:  Negative  for confusion and sleep disturbance.      Physical Exam Triage Vital Signs ED Triage Vitals  Enc Vitals Group     BP 10/06/21 1200 132/60     Pulse Rate 10/06/21 1200 (!) 59     Resp 10/06/21 1200 20     Temp 10/06/21 1200 98.9 F (37.2 C)     Temp src --      SpO2 10/06/21 1200 99 %     Weight --      Height --      Head Circumference --      Peak Flow --      Pain Score 10/06/21 1158 2     Pain Loc --      Pain Edu? --      Excl. in [place]? --    No data found.  Updated Vital Signs BP 132/60   Pulse (!) 59   Temp 98.9 F (37.2 C)   Resp 20   SpO2 99%   Visual Acuity Right Eye Distance:   Left Eye Distance:   Bilateral Distance:    Right Eye Near:   Left Eye Near:    Bilateral Near:     Physical Exam Vitals and nursing note reviewed.  Constitutional:      General: She is not in acute distress.    Appearance: Normal appearance. She is not ill-appearing.  HENT:     Head: Normocephalic and atraumatic.     Mouth/Throat:     Mouth: Angioedema (significant swelling upper lip) present. No oral lesions.     Tongue: Tongue does not deviate from midline.     Pharynx: Oropharynx is clear. Uvula midline. No pharyngeal swelling, oropharyngeal exudate, posterior oropharyngeal erythema or uvula swelling.  Eyes:     General: No scleral icterus.    Extraocular Movements: Extraocular movements intact.     Conjunctiva/sclera: Conjunctivae normal.  Cardiovascular:     Rate and Rhythm: Normal rate and  regular rhythm.     Heart sounds: No murmur heard. Pulmonary:     Effort: Pulmonary effort is normal. No respiratory distress.     Breath sounds: Normal breath sounds. No wheezing or rales.  Musculoskeletal:     Cervical back: Normal range of motion. No rigidity.  Skin:    Coloration: Skin is not jaundiced.     Findings: No rash.  Neurological:     General: No focal deficit present.     Mental Status: She is alert and oriented to person, place, and time.     Motor: No weakness.     Gait: Gait normal.  Psychiatric:        Mood and Affect: Mood normal.        Behavior: Behavior normal.      UC Treatments / Results  Labs (all labs ordered are listed, but only abnormal results are displayed) Labs Reviewed - No data to display  EKG   Radiology No results found.  Procedures Procedures (including critical care time)  Medications Ordered in UC Medications  dexamethasone (DECADRON) injection 10 mg (10 mg Intramuscular Given 10/06/21 1253)    Initial Impression / Assessment and Plan / UC Course  I have reviewed the triage vital signs and the nursing notes.  Pertinent labs & imaging results that were available during my care of the patient were reviewed by me and considered in my medical decision making (see chart for details).     Discontinue rosuvastatin and losartan Contact prescriber to discuss restarting or  changing medications Final Clinical Impressions(s) / UC Diagnoses   Final diagnoses:  Angioedema, initial encounter     Discharge Instructions      Discontinue statin Follow up with PCP / cardiologist Take zyrtec daily Take 25 mg benadryl every 6 hours Take Pepcid 20 mg daily    ED Prescriptions   None    PDMP not reviewed this encounter.   Peri Jefferson, PA-C 10/06/21 1311

## 2021-10-06 NOTE — Telephone Encounter (Signed)
I am sorry she had that reaction and hope she is doing okay. I had just increased her crestor to 40mg  from 20mg  and am worried it is more likely related to the losartan than to the crestor. Either way, we will not restart either. Can we refer her to lipid clinic to discuss an alternative option to statin therapy to avoid starting her on another statin medication just because she just had this reaction?  Thank you!

## 2021-10-06 NOTE — Telephone Encounter (Signed)
Called pt in regards to top lip swelling d/t rosuvastatin increase to 40 mg.  Pt reports took rosuvastatin 40 mg Friday night when woke up Saturday morning top lip was swollen.  Progressively worsened went to Urgent Care today. Per pt was given a steroid shot.  (Note in Epic)  Was advised to stop losartan and rosuvastatin.  Advised to take zyrtec, benadryl and pepcid.   Pt denies eating new foods or using new lip balm. Denies throat discomfort.  Does not check BP at home.  Advised will send to MD to review.

## 2021-10-06 NOTE — Addendum Note (Signed)
Addended by: Macie Burows on: 10/06/2021 02:34 PM   Modules accepted: Orders

## 2021-10-06 NOTE — Telephone Encounter (Signed)
Pt c/o medication issue:  1. Name of Medication: rosuvastatin (CRESTOR) 40 MG tablet  2. How are you currently taking this medication (dosage and times per day)? Take 1 tablet (40 mg total) by mouth daily.  3. Are you having a reaction (difficulty breathing--STAT)? no  4. What is your medication issue? Lip is swollen.  She took her first on Friday night, she woke up with a swollen lip on Saturday, but she states that is worse today.  She said she was currently at Urgent care.

## 2021-10-06 NOTE — Telephone Encounter (Signed)
Patient came into our office after visiting the urgent care. She said that she had a reaction to a medication that Dr. Shari Prows had changed. Her upper lip is very swollen.  Please call patient back regarding what she should do at this point.

## 2021-10-06 NOTE — Telephone Encounter (Signed)
Called pt advised of MD recommendations.  Losartan and rosuvastatin removed from medication list. Placed referral to lipid clinic pt unable to schedule at this time; does not have calendar present.  Pt will call in to schedule pharm d OV.  Advised pt to monitor BP QD or QOD and call into the office if BP starts to increase.  Pt reports will work on obtaining BP cuff.

## 2021-10-06 NOTE — ED Triage Notes (Signed)
Pt reports her upper lip started to swell on SAt and has worsened the patient had a recent . Pt reports increased dose of statin med on Friday just before lip swelling.

## 2021-10-07 ENCOUNTER — Ambulatory Visit
Admission: RE | Admit: 2021-10-07 | Discharge: 2021-10-07 | Disposition: A | Discharge status: Home / Self Care | Payer: Medicare Other | Source: Ambulatory Visit | Attending: Family Medicine | Admitting: Family Medicine

## 2021-10-07 ENCOUNTER — Telehealth: Payer: Self-pay | Admitting: Cardiology

## 2021-10-07 DIAGNOSIS — N6321 Unspecified lump in the left breast, upper outer quadrant: Secondary | ICD-10-CM

## 2021-10-07 HISTORY — PX: BREAST BIOPSY: SHX20

## 2021-10-07 NOTE — Telephone Encounter (Signed)
Spoke with the patient who reports that her headache has resolved. She has not taken her blood pressure today but is going to monitor. She is still taking medications prescribed to her for allergic reaction from ED.  Patient advised to continue to monitor and call back if headaches return and BP is elevated.

## 2021-10-07 NOTE — Telephone Encounter (Signed)
Called patient to schedule PharmD referral, she wanted me to send another message regarding her headaches. She states that she is not a headache person and this is not normal. She states that she cannot wake up another day with a headache and she is asking for a nurse to please give her a call back. Please advise.

## 2021-10-07 NOTE — Telephone Encounter (Signed)
Stopping a statin should not cause a headache which makes me think something else is causing her symptoms. How is her blood pressure? Is she still taking medication for the allergic reaction?

## 2021-10-07 NOTE — Telephone Encounter (Signed)
Pt c/o medication issue:  1. Name of Medication: she said it  was a Statin-can not remember  the name of it  2. How are you currently taking this medication (dosage and times per day)?   3. Are you having a reaction (difficulty breathing--STAT)?   4. What is your medication issue? Been having headaches since she stopped her Statin

## 2021-10-08 ENCOUNTER — Ambulatory Visit: Payer: Self-pay

## 2021-10-08 ENCOUNTER — Ambulatory Visit (INDEPENDENT_AMBULATORY_CARE_PROVIDER_SITE_OTHER): Payer: Medicare Other | Admitting: Surgery

## 2021-10-08 ENCOUNTER — Encounter: Payer: Self-pay | Admitting: Surgery

## 2021-10-08 VITALS — Ht 66.0 in | Wt 271.8 lb

## 2021-10-08 DIAGNOSIS — I2510 Atherosclerotic heart disease of native coronary artery without angina pectoris: Secondary | ICD-10-CM | POA: Diagnosis not present

## 2021-10-08 DIAGNOSIS — M542 Cervicalgia: Secondary | ICD-10-CM | POA: Diagnosis not present

## 2021-10-08 DIAGNOSIS — M47812 Spondylosis without myelopathy or radiculopathy, cervical region: Secondary | ICD-10-CM | POA: Diagnosis not present

## 2021-10-08 NOTE — Progress Notes (Signed)
Office Visit Note   Patient: Tammy [name]           Date of Birth: [birth year]           MRN: [id number] Visit Date: 10/08/2021              Requested by: Westley Chandler, MD [address] [place],  [place] [address] PCP: Westley Chandler, MD   Assessment & Plan: Visit Diagnoses:  1. Neck pain   2. Spondylosis of cervical region without myelopathy or radiculopathy     Plan: At this point we will recommend conservative treatment with physical therapy.  She can continue taking Tylenol.  Patient is unable to take oral NSAIDs due to history of chronic kidney disease.  Follow-up me in 3 weeks for recheck.  If she has not had any improvement may consider scheduling cervical MRI.  Follow-Up Instructions: Return in about 3 weeks (around 10/29/2021) for WITH Rien Marland RECHECK NECK.   Orders:  Orders Placed This Encounter  Procedures   XR Cervical Spine 2 or 3 views   No orders of the defined types were placed in this encounter.     Procedures: No procedures performed   Clinical Data: No additional findings.   Subjective: Chief Complaint  Patient presents with   Neck - Pain    HPI 66 year old white female who is new patient to clinic as referred by her primary care provider for neck pain x 2 months.  No injury.  Denies previous problems for onset.  Patient states "do not know what she sent me over here for" since she has been doing better taken Tylenol and she only feels pain at night.  States that she has bilateral neck pain that extends up to her face.  She states that she did tell her primary care about the facial pain that she has been having but I do not see formal workup for that.  During the day she is fine.  Denies upper extremity radicular symptoms.  Primary care provider ordered cervical spine x-ray which was done September 17, 2021 and that report showed:  CLINICAL DATA:  Left-sided neck pain.   EXAM: CERVICAL SPINE - COMPLETE 4+ VIEW   COMPARISON:  None Available.    FINDINGS: There is no evidence of cervical spine fracture or prevertebral soft tissue swelling. Alignment is normal. Narrowing of the bilateral C4-5 neural foramina with osteophyte encroachment noted.   IMPRESSION: Degenerative joint changes of bilateral C4-5 neural foramina.     Electronically Signed   By: Sherian Rein M.D. Review of Systems No current cardiac pulmonary GI GU issues  Objective: Vital Signs: Ht 5\' 6"  (1.676 m)   Wt 271 lb 12.8 oz (123.3 kg)   BMI 43.87 kg/m   Physical Exam HENT:     Head: Normocephalic and atraumatic.     Nose: Nose normal.  Eyes:     Extraocular Movements: Extraocular movements intact.  Pulmonary:     Effort: No respiratory distress.  Musculoskeletal:     Comments: Gait is normal.  Good cervical spine range of motion but with some discomfort.  Mild to moderate bilateral brachial plexus and trapezius tenderness.  No carotid bruits heard on exam.  Bilateral shoulder exam unremarkable.  No focal motor deficits.    Neurological:     Mental Status: She is alert and oriented to person, place, and time.  Psychiatric:        Mood and Affect: Mood normal.  Ortho Exam  Specialty Comments:  No specialty comments available.  Imaging: No results found.   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   Mastalgia 09/23/2020   CKD (chronic kidney disease), stage III (HCC) 05/17/2020   CAD (coronary artery disease)    PAD (peripheral artery disease) (HCC) 07/20/2014   HLD (hyperlipidemia)    Morbid obesity (HCC)    TOBACCO DEPENDENCE 04/29/2006   Essential hypertension 04/29/2006   Past Medical History:  Diagnosis Date   Abnormal mammogram 10.25.11   area of density with several adjacent amorphous calcifications located laterally w/in the right breast at approx 9-10 o'clock position likely represents area of evolving far necrosis; f/u mamo and U/S in 6 months   CAD  (coronary artery disease)    a. 07/2014 NSTEMI/Cath: LM nl, LAD 30ost/m, 60d, LCX 20p, OM1 20, OM2 40, OM3 50, RCA 30p, 76m, 20d, RPDA 40, EF 65%-->Med Rx;  b. 07/2014 Echo: EF 55-60%, no rwma, mildly dil LA.   Chronic kidney disease    Essential hypertension    GERD (gastroesophageal reflux disease)    Hyperlipidemia    Morbid obesity (HCC)    PAD (peripheral artery disease) (HCC)    a. 07/2014 ABI's: R 0.88, L 0.32.   Ulcer     Family History  Problem Relation Age of Onset   Cancer Mother        Bladder cancer, cause of death   Diabetes Mother    Diabetes Father    Hypertension Father    Hypertension Sister    Hypertension Brother    Cancer Maternal Aunt    Breast cancer Maternal Aunt        unsure of age   Ovarian cancer Maternal Aunt     Past Surgical History:  Procedure Laterality Date   BREAST BIOPSY Left 09/01/2018   BREAST BIOPSY Left 10/07/2021   CARDIAC CATHETERIZATION N/A 07/16/2014   Procedure: Left Heart Cath and Coronary Angiography;  Surgeon: Kathleene Hazel, MD;  Location: [facility] INVASIVE CV LAB;  Service: Cardiovascular;  Laterality: N/A;   CESAREAN SECTION     PERIPHERAL VASCULAR CATHETERIZATION N/A 02/12/2015   Procedure: Abdominal Aortogram;  Surgeon: Nada Libman, MD;  Location: [facility] INVASIVE CV LAB;  Service: Cardiovascular;  Laterality: N/A;   TRIGGER FINGER RELEASE Left    middle finger   UPPER GASTROINTESTINAL ENDOSCOPY  1991   Social History   Occupational History   Not on file  Tobacco Use   Smoking status: Every Day    Packs/day: 0.25    Years: 30.00    Total pack years: 7.50    Types: Cigarettes   Smokeless tobacco: Never   Tobacco comments:    8-10 cigarettes per day  Substance and Sexual Activity   Alcohol use: No    Alcohol/week: 0.0 standard drinks of alcohol   Drug use: No   Sexual activity: Not Currently    Birth control/protection: Post-menopausal, Abstinence

## 2021-10-28 ENCOUNTER — Encounter: Payer: Self-pay | Admitting: Family Medicine

## 2021-10-28 ENCOUNTER — Ambulatory Visit (HOSPITAL_COMMUNITY): Payer: Medicare Other | Attending: Cardiovascular Disease

## 2021-10-28 DIAGNOSIS — I10 Essential (primary) hypertension: Secondary | ICD-10-CM | POA: Diagnosis present

## 2021-10-28 DIAGNOSIS — I2510 Atherosclerotic heart disease of native coronary artery without angina pectoris: Secondary | ICD-10-CM

## 2021-10-28 DIAGNOSIS — E782 Mixed hyperlipidemia: Secondary | ICD-10-CM

## 2021-10-28 DIAGNOSIS — R931 Abnormal findings on diagnostic imaging of heart and coronary circulation: Secondary | ICD-10-CM | POA: Insufficient documentation

## 2021-10-28 LAB — ECHOCARDIOGRAM COMPLETE
Area-P 1/2: 3.01 cm2
S' Lateral: 2.9 cm

## 2021-10-29 ENCOUNTER — Encounter (HOSPITAL_BASED_OUTPATIENT_CLINIC_OR_DEPARTMENT_OTHER): Payer: Medicare Other | Admitting: Cardiovascular Disease

## 2021-11-04 ENCOUNTER — Other Ambulatory Visit: Payer: Medicare Other

## 2021-11-04 ENCOUNTER — Ambulatory Visit: Payer: Medicare Other | Attending: Cardiology

## 2021-11-04 DIAGNOSIS — I2510 Atherosclerotic heart disease of native coronary artery without angina pectoris: Secondary | ICD-10-CM | POA: Insufficient documentation

## 2021-11-04 DIAGNOSIS — I10 Essential (primary) hypertension: Secondary | ICD-10-CM | POA: Insufficient documentation

## 2021-11-04 DIAGNOSIS — E782 Mixed hyperlipidemia: Secondary | ICD-10-CM | POA: Insufficient documentation

## 2021-11-04 LAB — LIPID PANEL
Chol/HDL Ratio: 3.5 ratio (ref 0.0–4.4)
Cholesterol, Total: 228 mg/dL — ABNORMAL HIGH (ref 100–199)
HDL: 66 mg/dL (ref >39)
LDL Chol Calc (NIH): 140 mg/dL — ABNORMAL HIGH (ref 0–99)
Triglycerides: 126 mg/dL (ref 0–149)
VLDL Cholesterol Cal: 22 mg/dL (ref 5–40)

## 2021-11-04 LAB — ALT: ALT: 9 IU/L (ref 0–32)

## 2021-11-06 ENCOUNTER — Telehealth: Payer: Self-pay | Admitting: Cardiology

## 2021-11-06 ENCOUNTER — Encounter: Payer: Self-pay | Admitting: *Deleted

## 2021-11-06 ENCOUNTER — Encounter (HOSPITAL_COMMUNITY): Payer: Self-pay | Admitting: Cardiology

## 2021-11-06 DIAGNOSIS — R931 Abnormal findings on diagnostic imaging of heart and coronary circulation: Secondary | ICD-10-CM

## 2021-11-06 DIAGNOSIS — I2510 Atherosclerotic heart disease of native coronary artery without angina pectoris: Secondary | ICD-10-CM

## 2021-11-06 DIAGNOSIS — E782 Mixed hyperlipidemia: Secondary | ICD-10-CM

## 2021-11-06 NOTE — Telephone Encounter (Signed)
The patient has been notified of the result and verbalized understanding.  All questions (if any) were answered.  Pt aware that we will order a lexiscan to rule out ischemia, due to abnormal wall motion seen on echo.   Pt aware I will place the order in the system and send a message to our [facility] Scheduling team to call her back today and arrange this appt.  Pt does not have mychart to send instructions to, so will type them up and endorse to [facility] to go over these with the pt on the phone, due to this.   Attestation order pended in this encounter and will route this message back to Dr. Shari Prows to sign off on.   Pt verbalized understanding and agrees with this plan.

## 2021-11-06 NOTE — Telephone Encounter (Signed)
Pt is returning call in regards to labs. Requesting call back.  

## 2021-11-06 NOTE — Telephone Encounter (Signed)
Pt is scheduled for Lexiscan 2 day protocol for 9/26 and 9/28.  Pt made aware of appt dates and times by Nuc Scheduler.

## 2021-11-06 NOTE — Telephone Encounter (Signed)
-----   Message from Tammy Sprague, MD sent at 11/04/2021  7:32 PM EDT ----- Her echo shows her pumping function is normal 55%. There is slightly abnormal motion of a small region of the heart. To help assess this further, can we do a lexiscan to ensure there is no evidence of abnormal blood flow that may be causing this funny wall motion? Otherwise, the right side of her heart looks good and she has no significant valve disease.

## 2021-11-07 ENCOUNTER — Telehealth: Payer: Self-pay | Admitting: Family Medicine

## 2021-11-07 ENCOUNTER — Ambulatory Visit (INDEPENDENT_AMBULATORY_CARE_PROVIDER_SITE_OTHER): Payer: Medicare Other | Admitting: Family Medicine

## 2021-11-07 ENCOUNTER — Encounter: Payer: Self-pay | Admitting: Family Medicine

## 2021-11-07 VITALS — BP 158/82 | HR 51 | Wt 280.8 lb

## 2021-11-07 DIAGNOSIS — I10 Essential (primary) hypertension: Secondary | ICD-10-CM | POA: Diagnosis not present

## 2021-11-07 DIAGNOSIS — F17200 Nicotine dependence, unspecified, uncomplicated: Secondary | ICD-10-CM

## 2021-11-07 DIAGNOSIS — Z780 Asymptomatic menopausal state: Secondary | ICD-10-CM | POA: Diagnosis not present

## 2021-11-07 DIAGNOSIS — E782 Mixed hyperlipidemia: Secondary | ICD-10-CM

## 2021-11-07 DIAGNOSIS — I739 Peripheral vascular disease, unspecified: Secondary | ICD-10-CM

## 2021-11-07 DIAGNOSIS — I288 Other diseases of pulmonary vessels: Secondary | ICD-10-CM | POA: Diagnosis not present

## 2021-11-07 MED ORDER — AMLODIPINE BESYLATE 10 MG PO TABS
10.0000 mg | ORAL_TABLET | Freq: Every day | ORAL | 3 refills | Status: DC
Start: 2021-11-07 — End: 2022-07-09

## 2021-11-07 MED ORDER — NITROGLYCERIN 0.4 MG SL SUBL
0.4000 mg | SUBLINGUAL_TABLET | SUBLINGUAL | 1 refills | Status: AC | PRN
Start: 2021-11-07 — End: ?

## 2021-11-07 MED ORDER — ASPIRIN 81 MG PO TBEC: 81.0000 mg | DELAYED_RELEASE_TABLET | Freq: Every day | ORAL | 3 refills | Status: DC

## 2021-11-07 MED ORDER — CLOPIDOGREL BISULFATE 75 MG PO TABS: 75.0000 mg | ORAL_TABLET | Freq: Every day | ORAL | 3 refills | Status: DC

## 2021-11-07 NOTE — Assessment & Plan Note (Signed)
Discussed--encouraged cessation

## 2021-11-07 NOTE — Assessment & Plan Note (Signed)
Discussed BP not at goal Could increase hydralazine to TID from BID She will measure at home and call with values as did not take medications today

## 2021-11-07 NOTE — Patient Instructions (Addendum)
It was wonderful to see you today.  Please bring ALL of your medications with you to every visit.   Today we talked about:  - Call me and let me know if you are taking Losartan   - You will be called by the lung doctor (pulmonary medicine) for the enlarged artery going to your lungs--this is likely from your smoking  Keep up the great work with quitting smoking  Please follow up in 1 months for flu and pneumonia vaccine   Please call me with 1-2 blood pressure readings at home   Thank you for choosing [facility].   Please call [phone] with any questions about today's appointment.  Please be sure to schedule follow up at the front  desk before you leave today.   [name], MD  Family Medicine

## 2021-11-07 NOTE — Assessment & Plan Note (Addendum)
Discussed at length--spend 20 minutes reviewing CT, discussing pulmonary artery and how smoking can change heart pressures--referred to Pulmonary medicine for PFT and further recommendations See below on smoking cessation

## 2021-11-07 NOTE — Telephone Encounter (Signed)
Patient called to let Dr. Manson Passey know she is not taking Losartan. If patient needs to start taking it, please let her know.

## 2021-11-07 NOTE — Assessment & Plan Note (Signed)
Referred to lipid clinic by cardiology given potential reaction to rosuvastatin

## 2021-11-07 NOTE — Progress Notes (Addendum)
    SUBJECTIVE:   CHIEF COMPLAINT: medication issue HPI:   Tammy [name] is a 66 y.o.  with history notable for CKD, Hypertension, CAD, and tobacco abuse presenting for follow up.  She reports overall she is doing well. She is smoking 6 cigarettes a day. She is using patches and gums. She has not yet set a quit date.   The patient was recently seen by Orthopedics for her neck pain and radiating pain. She reports PT is very challenging for her given transport issues. She would like an MRI for diagnosis/prognosis but reports she would not want surgery.   She has not taken her antihypertensives today. Denies headaches, chest pain, dypsnea, vision changes. She recently was seen in the ED for upper lip edema, thought to be due to losartan or rosuvastatin.    PERTINENT  PMH / PSH/Family/Social History : updated and reviewed   OBJECTIVE:   BP (!) 158/82   Pulse (!) 51   Wt 280 lb 12.8 oz (127.4 kg)   SpO2 100%   BMI 45.32 kg/m   Today's weight:  Last Weight  Most recent update: 11/07/2021  8:50 AM    Weight  127.4 kg (280 lb 12.8 oz)            Review of prior weights: [employer]   11/07/21 0849  Weight: 280 lb 12.8 oz (127.4 kg)    Cardiac: Regular rate and rhythm. Normal S1/S2. No murmurs, rubs, or gallops appreciated. Lungs: Clear bilaterally to ascultation.  Psych: Pleasant and appropriate    ASSESSMENT/PLAN:   Essential hypertension Discussed BP not at goal Could increase hydralazine to TID from BID She will measure at home and call with values as did not take medications today   HLD (hyperlipidemia) Referred to lipid clinic by cardiology given potential reaction to rosuvastatin   TOBACCO DEPENDENCE Discussed--encouraged cessation    Enlarged pulmonary artery (HCC) Discussed at length--spend 20 minutes reviewing CT, discussing pulmonary artery and how smoking can change heart pressures--referred to Pulmonary medicine for PFT and further  recommendations See below on smoking cessation    Medication reaction - Seen in ED for severe upper lip edema after increasing crestor  - Thought to be due to ARB vs. Statin - Will need to identify alternatives to these agents--also cannot tolerate ACE inhibitors   Neck Pain Discussed benefits of PT  Will route to Mr. Barry Dienes for further recommendations regarding imaging at this time vs. Continued waiting   At follow up - Discuss DEXA again, declined - Wants PCV and Flu at follow up       [name], MD  Family Medicine Teaching Service  [facility]

## 2021-11-10 NOTE — Telephone Encounter (Signed)
Called and discussed medications and imaging recommendations.  [name], MD  Family Medicine Teaching Service

## 2021-11-11 ENCOUNTER — Ambulatory Visit: Payer: Medicare Other | Attending: Internal Medicine | Admitting: Pharmacist

## 2021-11-11 ENCOUNTER — Telehealth: Payer: Self-pay | Admitting: Pharmacist

## 2021-11-11 DIAGNOSIS — I739 Peripheral vascular disease, unspecified: Secondary | ICD-10-CM | POA: Diagnosis present

## 2021-11-11 DIAGNOSIS — I10 Essential (primary) hypertension: Secondary | ICD-10-CM

## 2021-11-11 DIAGNOSIS — I2510 Atherosclerotic heart disease of native coronary artery without angina pectoris: Secondary | ICD-10-CM

## 2021-11-11 DIAGNOSIS — E782 Mixed hyperlipidemia: Secondary | ICD-10-CM | POA: Diagnosis present

## 2021-11-11 NOTE — Progress Notes (Signed)
Patient ID: Tammy [name]                 DOB: [birth year]                    MRN: [id number]      HPI: Tammy [name] is a 66 y.o. female patient referred to lipid clinic by Dr. Shari Prows. PMH is significant for hypertension, hyperlipidemia, PAD, CAD s/p NSTEMI (medically managed), obesity and tobacco abuse. Patient's rosuvastatin was increased from 20mg  to 40mg  in July. A few days later she experienced lip swelling. Both rosuvastatin and losartan were stopped. She was referred to the lipid clinic.  Patient presents today to lipid clinic. She is a little upset about all that has been going on. Her PCP told her she can't taking anything that ends in statin and she was taken off of her losartan. All she knows is she increased the dose of rosuvastatin and her lips started swelling. I did explain to her that this is more typical of losartan, but because the dose of rosuvastatin was increased prior, we cannot rule that out as a cause. Patient states she does not eat much at all and doesn't understand why her cholesterol is high. Has been taking ezetimibe.   Cannot walk far due to PAD and back issues. Use to walk to and from work. Does do chair exercises, but not everyday. Most of her walking is in the grocery store and she cannot make it the entire way. Tries to walk through the pain.   Current Medications: ezetimibe? Intolerances: ezetimibe (rash), rosuvastatin (lip swelling) Risk Factors: pre-mature CAD, PAD, HTN, tobacco  LDL goal: <55  Diet: patient reports she does not eat much  Exercise: chair exercises  Family History: The patient's family history includes Breast cancer in her maternal aunt; Cancer in her maternal aunt and mother; Diabetes in her father and mother; Hypertension in her brother, father, and sister; Ovarian cancer in her maternal aunt.  Social History:   Labs: 11/04/21 TC 228, TG 126, HDL 66, LDL-C 140 (ezetimibe 10mg  daily)  Past Medical History:  Diagnosis Date    Abnormal mammogram 10.25.11   area of density with several adjacent amorphous calcifications located laterally w/in the right breast at approx 9-10 o'clock position likely represents area of evolving far necrosis; f/u mamo and U/S in 6 months   CAD (coronary artery disease)    a. 07/2014 NSTEMI/Cath: LM nl, LAD 30ost/m, 60d, LCX 20p, OM1 20, OM2 40, OM3 50, RCA 30p, 87m, 20d, RPDA 40, EF 65%-->Med Rx;  b. 07/2014 Echo: EF 55-60%, no rwma, mildly dil LA.   Chronic kidney disease    Essential hypertension    GERD (gastroesophageal reflux disease)    Hyperlipidemia    Morbid obesity (HCC)    PAD (peripheral artery disease) (HCC)    a. 07/2014 ABI's: R 0.88, L 0.32.   Ulcer     Current Outpatient Medications on File Prior to Visit  Medication Sig Dispense Refill   amLODipine (NORVASC) 10 MG tablet Take 1 tablet (10 mg total) by mouth daily. 90 tablet 3   aspirin EC 81 MG tablet Take 1 tablet (81 mg total) by mouth daily. 90 tablet 3   clindamycin (CLEOCIN-T) 1 % external solution Apply topically 2 (two) times daily. Apply to lesion on mons pubis twice daily for 1 week 60 mL 2   clopidogrel (PLAVIX) 75 MG tablet Take 1 tablet (75 mg total) by mouth daily. 90 tablet  3   cyclobenzaprine (FLEXERIL) 5 MG tablet Take 5 mg by mouth 3 (three) times daily as needed.     ezetimibe (ZETIA) 10 MG tablet Take 1 tablet (10 mg total) by mouth at bedtime. 90 tablet 3   hydrALAZINE (APRESOLINE) 50 MG tablet Take 1 tablet (50 mg total) by mouth in the morning and at bedtime. 180 tablet 2   Insulin Pen Needle 31G X 8 MM MISC Use with GGEZMO (Patient not taking: Reported on 09/25/2021) 100 each 0   ketoconazole (NIZORAL) 2 % cream Apply 1 application. topically daily. (Patient not taking: Reported on 09/25/2021) 60 g 1   metoprolol tartrate (LOPRESSOR) 100 MG tablet Take 1 tablet (100 mg total) by mouth 2 (two) times daily. 180 tablet 2   nicotine polacrilex (NICORETTE) 2 MG gum Take 1 each (2 mg total) by mouth as  needed for smoking cessation. 100 tablet 0   nitroGLYCERIN (NITROSTAT) 0.4 MG SL tablet Place 1 tablet (0.4 mg total) under the tongue every 5 (five) minutes as needed for chest pain. 90 tablet 1   Semaglutide,0.25 or 0.5MG /DOS, 2 MG/1.5ML SOPN Inject 0.25 mg into the skin once a week. 0.25 mg once weekly for 4 weeks then increase to 0.5 mg weekly for at least 4 weeks,max 1 mg (Patient not taking: Reported on 09/25/2021) 3 mL 1   Vitamin D, Ergocalciferol, (DRISDOL) 1.25 MG (50000 UNIT) CAPS capsule Take 50,000 Units by mouth once a week.     Current Facility-Administered Medications on File Prior to Visit  Medication Dose Route Frequency Provider Last Rate Last Admin   acetaminophen (TYLENOL) tablet 975 mg  975 mg Oral Once Renne Musca, MD        Allergies  Allergen Reactions   Ace Inhibitors     REACTION: rash on breast   Hydrochlorothiazide     REACTION: "rash on breast and breast pain"   Losartan Swelling    Evaluated in ED for lip swelling    Rosuvastatin Swelling    Evaluated in ED for lip swelling    Ezetimibe Rash    Vaginal rash.     Assessment/Plan:  1. Hyperlipidemia - LDL-C is above goal of <55 due to premature disease. Since we cannot rule out statins as cause of her angioedema, will have to avoid all statins. Discussed PCKS9i with patient. Reviewed efficacy, injection technique and side effects. Advised patient that mild injection site reaction was normal and not to be concerned about. I will submit a prior authorization for Repatha. Repeat labs in 2-3 months. Continue ezetimibe. Discussed with patient the smaller effect diet has on cholesterol and the aggressive risk reduction we desire for her. Encouraged her to do chair exercises daily. She is working with PCP on BP.  Thank you,  Olene Floss, Pharm.D, BCPS, CPP [facility]  [address], [place], [place] [address]  Phone: [phone]; Fax: [phone]

## 2021-11-11 NOTE — Patient Instructions (Addendum)
We still submit for prior approval for Repatha or Praluent Please continue ezetimibe 10mg  daily  I will call you once I hear back Please continue doing your chair exercises. Try to do them daily.

## 2021-11-11 NOTE — Telephone Encounter (Signed)
PA for Repatha submitted Key: [id number]

## 2021-11-12 ENCOUNTER — Ambulatory Visit (HOSPITAL_BASED_OUTPATIENT_CLINIC_OR_DEPARTMENT_OTHER): Payer: Medicare Other | Admitting: Cardiology

## 2021-11-12 ENCOUNTER — Telehealth: Payer: Self-pay | Admitting: Cardiology

## 2021-11-12 ENCOUNTER — Telehealth: Payer: Self-pay

## 2021-11-12 DIAGNOSIS — I10 Essential (primary) hypertension: Secondary | ICD-10-CM

## 2021-11-12 DIAGNOSIS — R400 Somnolence: Secondary | ICD-10-CM

## 2021-11-12 DIAGNOSIS — I288 Other diseases of pulmonary vessels: Secondary | ICD-10-CM

## 2021-11-12 DIAGNOSIS — I739 Peripheral vascular disease, unspecified: Secondary | ICD-10-CM

## 2021-11-12 DIAGNOSIS — E782 Mixed hyperlipidemia: Secondary | ICD-10-CM

## 2021-11-12 DIAGNOSIS — I2510 Atherosclerotic heart disease of native coronary artery without angina pectoris: Secondary | ICD-10-CM

## 2021-11-12 MED ORDER — REPATHA SURECLICK 140 MG/ML ~~LOC~~ SOAJ: 1.0000 | SUBCUTANEOUS | 11 refills | Status: DC

## 2021-11-12 NOTE — Telephone Encounter (Signed)
Split night sleep study ordered and will send back to our Sleep Study Coordinator, to further follow-up and assist with scheduling this appt for the pt.

## 2021-11-12 NOTE — Telephone Encounter (Signed)
Called patient and discussed. All questions answered.  [name], MD  Family Medicine Teaching Service

## 2021-11-12 NOTE — Telephone Encounter (Signed)
Patient calls nurse line regarding neck pain and headaches. Reports that she was having significant pain on Monday. States that she was unable to move neck to the right due to pain. States that pain started on the right side of neck and radiated to the right side of head.   She took baclofen and tylenol and pain improved. She reports that right now she is feeling fine now. Patient states that she is not interested in PT at this time.   Please advise any other recommendations. She was seen last for this concern on 11/07/21.  Veronda Prude, RN

## 2021-11-12 NOTE — Telephone Encounter (Signed)
Patient went to the sleep lab to pick her home sleep test. After hearing how to apply the test patient felt like she would not be able to do it. Sleep lab suggested an in lab test.

## 2021-11-12 NOTE — Telephone Encounter (Signed)
Pt is requesting call back to discuss her appt today 09/13 with sleep studies to discuss options she was given. Please advise.

## 2021-11-12 NOTE — Telephone Encounter (Signed)
PA approved through 11/11/22. Rx sent to pharmacy. Pt scheduled for labs 12/5.

## 2021-11-14 NOTE — Telephone Encounter (Signed)
Ayleah, Hofmeister A - 11/12/2021  9:46 AM Reesa Chew, CMA  Sent: Thu November 13, 2021 12:44 PM  To: Loa Socks, LPN          Message  Ok it is in the work que.  ----- Message -----  From: Loa Socks, LPN  Sent: 07/28/4130   3:46 PM EDT  To: Reesa Chew, CMA

## 2021-11-19 ENCOUNTER — Telehealth (HOSPITAL_COMMUNITY): Payer: Self-pay

## 2021-11-19 NOTE — Telephone Encounter (Signed)
Spoke with the patient, detailed instructions given. She stated that she would be here for her test. Asked to call back with any questions. S.Kane Kusek EMTP 

## 2021-11-25 ENCOUNTER — Ambulatory Visit (HOSPITAL_COMMUNITY): Payer: Medicare Other | Attending: Cardiology

## 2021-11-25 DIAGNOSIS — R931 Abnormal findings on diagnostic imaging of heart and coronary circulation: Secondary | ICD-10-CM | POA: Insufficient documentation

## 2021-11-25 DIAGNOSIS — E782 Mixed hyperlipidemia: Secondary | ICD-10-CM | POA: Diagnosis present

## 2021-11-25 DIAGNOSIS — I2510 Atherosclerotic heart disease of native coronary artery without angina pectoris: Secondary | ICD-10-CM | POA: Insufficient documentation

## 2021-11-25 MED ORDER — TECHNETIUM TC 99M TETROFOSMIN IV KIT
33.0000 | PACK | Freq: Once | INTRAVENOUS | Status: AC | PRN
  Administered 2021-11-25: 33 via INTRAVENOUS

## 2021-11-25 MED ORDER — REGADENOSON 0.4 MG/5ML IV SOLN
0.4000 mg | Freq: Once | INTRAVENOUS | Status: AC
  Administered 2021-11-25: 0.4 mg via INTRAVENOUS

## 2021-11-25 NOTE — Progress Notes (Unsigned)
z

## 2021-11-27 ENCOUNTER — Ambulatory Visit (HOSPITAL_COMMUNITY): Payer: Medicare Other | Attending: Cardiology

## 2021-11-27 LAB — MYOCARDIAL PERFUSION IMAGING
LV dias vol: 81 mL (ref 46–106)
LV sys vol: 35 mL
Nuc Stress EF: 57 %
Peak HR: 71 {beats}/min
Rest HR: 54 {beats}/min
Rest Nuclear Isotope Dose: 31.3 mCi
SDS: 0
SRS: 0
SSS: 0
ST Depression (mm): 0 mm
Stress Nuclear Isotope Dose: 33 mCi
TID: 0.84

## 2021-11-27 MED ORDER — TECHNETIUM TC 99M TETROFOSMIN IV KIT
31.3000 | PACK | Freq: Once | INTRAVENOUS | Status: AC | PRN
  Administered 2021-11-27: 31.3 via INTRAVENOUS

## 2021-12-10 ENCOUNTER — Institutional Professional Consult (permissible substitution): Payer: Medicare Other | Admitting: Pulmonary Disease

## 2021-12-16 ENCOUNTER — Encounter: Payer: Self-pay | Admitting: Pulmonary Disease

## 2021-12-16 ENCOUNTER — Ambulatory Visit (INDEPENDENT_AMBULATORY_CARE_PROVIDER_SITE_OTHER): Payer: Medicare Other | Admitting: Pulmonary Disease

## 2021-12-16 VITALS — HR 64 | Ht 66.0 in | Wt 289.4 lb

## 2021-12-16 DIAGNOSIS — F17200 Nicotine dependence, unspecified, uncomplicated: Secondary | ICD-10-CM

## 2021-12-16 DIAGNOSIS — R9389 Abnormal findings on diagnostic imaging of other specified body structures: Secondary | ICD-10-CM | POA: Diagnosis not present

## 2021-12-16 NOTE — Progress Notes (Signed)
Tammy [name]    VO:[id number]    03-16-1955  Primary Care Physician:Brown, Raiford Simmonds, MD  Referring Physician: Martyn Malay, [place],  [place] [address]  Chief complaint:   Patient being seen for abnormal CT showing enlarged pulmonary arteries  HPI:  Patient denies any significant symptoms States she does not know why she is here  I did explain she was referred because of abnormal CT showing enlarged pulmonary arteries  She denies any symptoms at present Denies any chest pains, denies any chest discomfort Denies a cough Denies shortness of breath relating to breathing  An active smoker, half a pack a day  No pertinent occupational history  She does not snore Does not feel sleepy during the day  She feels she is well and would rather discuss ongoing issues with her primary doctor   Outpatient Encounter Medications as of 12/16/2021  Medication Sig   amLODipine (NORVASC) 10 MG tablet Take 1 tablet (10 mg total) by mouth daily.   aspirin EC 81 MG tablet Take 1 tablet (81 mg total) by mouth daily.   clindamycin (CLEOCIN-T) 1 % external solution Apply topically 2 (two) times daily. Apply to lesion on mons pubis twice daily for 1 week   clopidogrel (PLAVIX) 75 MG tablet Take 1 tablet (75 mg total) by mouth daily.   cyclobenzaprine (FLEXERIL) 5 MG tablet Take 5 mg by mouth 3 (three) times daily as needed.   Evolocumab (REPATHA SURECLICK) [id number] MG/ML SOAJ Inject 1 Pen into the skin every 14 (fourteen) days.   ezetimibe (ZETIA) 10 MG tablet Take 1 tablet (10 mg total) by mouth at bedtime.   hydrALAZINE (APRESOLINE) 50 MG tablet Take 1 tablet (50 mg total) by mouth in the morning and at bedtime.   Insulin Pen Needle 31G X 8 MM MISC Use with [place]govy   ketoconazole (NIZORAL) 2 % cream Apply 1 application. topically daily.   metoprolol tartrate (LOPRESSOR) 100 MG tablet Take 1 tablet (100 mg total) by mouth 2 (two) times daily.   nicotine polacrilex  (NICORETTE) 2 MG gum Take 1 each (2 mg total) by mouth as needed for smoking cessation.   nitroGLYCERIN (NITROSTAT) 0.4 MG SL tablet Place 1 tablet (0.4 mg total) under the tongue every 5 (five) minutes as needed for chest pain.   Semaglutide,0.25 or 0.5MG /DOS, 2 MG/1.5ML SOPN Inject 0.25 mg into the skin once a week. 0.25 mg once weekly for 4 weeks then increase to 0.5 mg weekly for at least 4 weeks,max 1 mg   Vitamin D, Ergocalciferol, (DRISDOL) 1.25 MG (50000 UNIT) CAPS capsule Take 50,000 Units by mouth once a week. (Patient not taking: Reported on 12/16/2021)   Facility-Administered Encounter Medications as of 12/16/2021  Medication   acetaminophen (TYLENOL) tablet 975 mg    Allergies as of 12/16/2021 - Review Complete 12/16/2021  Allergen Reaction Noted   Ace inhibitors  10/14/2007   Hydrochlorothiazide  02/28/2010   Losartan Swelling 11/07/2021   Rosuvastatin Swelling 11/07/2021    Past Medical History:  Diagnosis Date   Abnormal mammogram 10.25.11   area of density with several adjacent amorphous calcifications located laterally w/in the right breast at approx 9-10 o'clock position likely represents area of evolving far necrosis; f/u mamo and U/S in 6 months   CAD (coronary artery disease)    a. 07/2014 NSTEMI/Cath: LM nl, LAD 30ost/m, 60d, LCX 20p, OM1 20, OM2 40, OM3 50, RCA 30p, 35m, 20d, RPDA  40, EF 65%-->Med Rx;  b. 07/2014 Echo: EF 55-60%, no rwma, mildly dil LA.   Chronic kidney disease    Essential hypertension    GERD (gastroesophageal reflux disease)    Hyperlipidemia    Morbid obesity (HCC)    PAD (peripheral artery disease) ([place])    a. 07/2014 ABI's: R 0.88, L 0.32.   Ulcer     Past Surgical History:  Procedure Laterality Date   BREAST BIOPSY Left 09/01/2018   BREAST BIOPSY Left 10/07/2021   CARDIAC CATHETERIZATION N/A 07/16/2014   Procedure: Left Heart Cath and Coronary Angiography;  Surgeon: Burnell Blanks, MD;  Location: [facility];  Service:  Cardiovascular;  Laterality: N/A;   CESAREAN SECTION     PERIPHERAL VASCULAR CATHETERIZATION N/A 02/12/2015   Procedure: Abdominal Aortogram;  Surgeon: Serafina Mitchell, MD;  Location: [facility];  Service: Cardiovascular;  Laterality: N/A;   TRIGGER FINGER RELEASE Left    middle finger   UPPER GASTROINTESTINAL ENDOSCOPY  1991    Family History  Problem Relation Age of Onset   Cancer Mother        Bladder cancer, cause of death   Diabetes Mother    Diabetes Father    Hypertension Father    Hypertension Sister    Hypertension Brother    Cancer Maternal Aunt    Breast cancer Maternal Aunt        unsure of age   Ovarian cancer Maternal Aunt     Social History   Socioeconomic History   Marital status: Single    Spouse name: Not on file   Number of children: Not on file   Years of education: Not on file   Highest education level: Not on file  Occupational History   Not on file  Tobacco Use   Smoking status: Every Day    Packs/day: 0.25    Years: 30.00    Total pack years: 7.50    Types: Cigarettes   Smokeless tobacco: Never   Tobacco comments:    8-10 cigarettes per day or more  Substance and Sexual Activity   Alcohol use: No    Alcohol/week: 0.0 standard drinks of alcohol   Drug use: No   Sexual activity: Not Currently    Birth control/protection: Post-menopausal, Abstinence  Other Topics Concern   Not on file  Social History Narrative   Not on file   Social Determinants of Health   Financial Resource Strain: Not on file  Food Insecurity: Not on file  Transportation Needs: Not on file  Physical Activity: Not on file  Stress: Not on file  Social Connections: Not on file  Intimate Partner Violence: Not on file    Review of Systems  Respiratory:  Negative for cough, chest tightness, shortness of breath and wheezing.     Vitals:   12/16/21 0901  Pulse: 64  SpO2: 96%     Physical Exam Constitutional:      Appearance: She is obese.  HENT:      Head: Normocephalic.     Mouth/Throat:     Mouth: Mucous membranes are moist.  Cardiovascular:     Rate and Rhythm: Normal rate and regular rhythm.     Heart sounds: No murmur heard.    No friction rub.  Pulmonary:     Effort: No respiratory distress.     Breath sounds: No stridor. No wheezing or rhonchi.  Musculoskeletal:     Cervical back: No rigidity or tenderness.  Neurological:     Mental Status: She is alert.  Psychiatric:        Mood and Affect: Mood normal.      Data Reviewed: Patient had a recent echocardiogram 10/28/2021 -Ejection fraction of 55%, some regional wall abnormalities, no mention of diastolic dysfunction, no mention of pulmonary hypertension  No pulmonary function test on record  CT chest 09/22/2021-reviewed by myself - Small left pulmonary nodule - Evidence of emphysema  Assessment:  Abnormal CT scan of the chest-small lung nodule   Emphysema on CT scan of the chest  Active smoker  Concern for pulmonary hypertension -Patient denies shortness of breath, denies any limitation regarding her breathing -Recent echocardiogram shows no pulmonary hypertension  Plan/Recommendations: Patient continued to insist that she does not understand the reason for coming here despite attempts to have a conversation regarding why she was sent here  She stated she will follow-up with her primary doctor  Consider a pulmonary function test to assess degree of obstructive lung disease  May consider bronchodilators if shortness of breath or significant reduction in FEV1  Follow-up echocardiogram to assess pulmonary pressures  I do not think invasive intervention such as right heart catheterization will be helpful at present  Encourage patient to quit smoking  Encourage weight loss efforts  Will be glad to follow-up if patient is agreeable  Needs lung cancer screening follow-up CT in a year from previous   Sherrilyn Rist MD [facility] 12/16/2021, 9:21 AM  CC: Martyn Malay, MD

## 2021-12-16 NOTE — Patient Instructions (Signed)
Patient seen for abnormal CT showing enlarged pulmonary arteries  Recent echo does not mention pulmonary hypertension  I will see you as needed  Call with significant concerns  May consider pulmonary function test if agreeable  Smoking cessation will go a long way

## 2021-12-23 ENCOUNTER — Telehealth: Payer: Self-pay | Admitting: Pharmacist

## 2021-12-23 NOTE — Telephone Encounter (Signed)
Patient with new insurance. Resubmitted PA Key: [id number]

## 2021-12-26 ENCOUNTER — Ambulatory Visit (INDEPENDENT_AMBULATORY_CARE_PROVIDER_SITE_OTHER): Payer: Medicare Other | Admitting: Family Medicine

## 2021-12-26 ENCOUNTER — Other Ambulatory Visit: Payer: Self-pay

## 2021-12-26 ENCOUNTER — Encounter: Payer: Self-pay | Admitting: Family Medicine

## 2021-12-26 VITALS — BP 143/78 | HR 61 | Wt 287.0 lb

## 2021-12-26 DIAGNOSIS — F17200 Nicotine dependence, unspecified, uncomplicated: Secondary | ICD-10-CM

## 2021-12-26 DIAGNOSIS — I288 Other diseases of pulmonary vessels: Secondary | ICD-10-CM

## 2021-12-26 DIAGNOSIS — R635 Abnormal weight gain: Secondary | ICD-10-CM

## 2021-12-26 DIAGNOSIS — I10 Essential (primary) hypertension: Secondary | ICD-10-CM | POA: Diagnosis not present

## 2021-12-26 DIAGNOSIS — N1832 Chronic kidney disease, stage 3b: Secondary | ICD-10-CM | POA: Diagnosis not present

## 2021-12-26 DIAGNOSIS — R7301 Impaired fasting glucose: Secondary | ICD-10-CM

## 2021-12-26 DIAGNOSIS — Z23 Encounter for immunization: Secondary | ICD-10-CM | POA: Diagnosis not present

## 2021-12-26 DIAGNOSIS — E782 Mixed hyperlipidemia: Secondary | ICD-10-CM

## 2021-12-26 NOTE — Patient Instructions (Addendum)
It was wonderful to see you today.  Please bring ALL of your medications with you to every visit.   Today we talked about:  --Getting blood work for Dr. Royce Macadamia   ---I will call you with results   --We will do a lung test in the spring  --I will check with your insurance about Semaglutide [facility] for weight loss)     Please follow up in 2 months   Thank you for choosing [facility].   Please call [phone] with any questions about today's appointment.  Please be sure to schedule follow up at the front  desk before you leave today.   Dorris Singh, MD  Family Medicine

## 2021-12-26 NOTE — Assessment & Plan Note (Signed)
A1C today Does not like weight gain associated with tobacco cessation  Pending A1C, will consider semaglutide again

## 2021-12-26 NOTE — Assessment & Plan Note (Signed)
Doing well with Repatha

## 2021-12-26 NOTE — Assessment & Plan Note (Signed)
Nearly at goal Did not take medications today  BMP today--continue current medications, has Nephrology follow up Of note, no longer on ARB due to concern for angioedema (presented to ED)

## 2021-12-26 NOTE — Progress Notes (Signed)
SUBJECTIVE:   CHIEF COMPLAINT: questions about medications, appointments  HPI:   Tammy [name] is a 66 y.o.  with history notable for HTN, CKD IIIB, CAD, and dyslipidemia presenting for follow up.  She is down to 5 cigarettes per day. Determined to quit but upset about her weights. No dyspnea, cough, or wheezing. Was seen by Pulmonary Medicine who recommended PFT and yearly echocardiogram. She is amenable to PFTs in coming months.  She reports compliance with her medications. She did not take her anti-hypertensive medications today. She is no longer taking losartan due to angioedema.   Current Outpatient Medications:    amLODipine (NORVASC) 10 MG tablet, Take 1 tablet (10 mg total) by mouth daily., Disp: 90 tablet, Rfl: 3   aspirin EC 81 MG tablet, Take 1 tablet (81 mg total) by mouth daily., Disp: 90 tablet, Rfl: 3   clopidogrel (PLAVIX) 75 MG tablet, Take 1 tablet (75 mg total) by mouth daily., Disp: 90 tablet, Rfl: 3   cyclobenzaprine (FLEXERIL) 5 MG tablet, Take 5 mg by mouth 3 (three) times daily as needed., Disp: , Rfl:    Evolocumab (REPATHA SURECLICK) 762 MG/ML SOAJ, Inject 1 Pen into the skin every 14 (fourteen) days., Disp: 2 mL, Rfl: 11   ezetimibe (ZETIA) 10 MG tablet, Take 1 tablet (10 mg total) by mouth at bedtime., Disp: 90 tablet, Rfl: 3   hydrALAZINE (APRESOLINE) 50 MG tablet, Take 1 tablet (50 mg total) by mouth in the morning and at bedtime., Disp: 180 tablet, Rfl: 2   metoprolol tartrate (LOPRESSOR) 100 MG tablet, Take 1 tablet (100 mg total) by mouth 2 (two) times daily., Disp: 180 tablet, Rfl: 2   clindamycin (CLEOCIN-T) 1 % external solution, Apply topically 2 (two) times daily. Apply to lesion on mons pubis twice daily for 1 week, Disp: 60 mL, Rfl: 2   Insulin Pen Needle 31G X 8 MM MISC, Use with Wegovy, Disp: 100 each, Rfl: 0   ketoconazole (NIZORAL) 2 % cream, Apply 1 application. topically daily., Disp: 60 g, Rfl: 1   nicotine polacrilex (NICORETTE) 2 MG  gum, Take 1 each (2 mg total) by mouth as needed for smoking cessation., Disp: 100 tablet, Rfl: 0   nitroGLYCERIN (NITROSTAT) 0.4 MG SL tablet, Place 1 tablet (0.4 mg total) under the tongue every 5 (five) minutes as needed for chest pain., Disp: 90 tablet, Rfl: 1   Semaglutide,0.25 or 0.5MG /DOS, 2 MG/1.5ML SOPN, Inject 0.25 mg into the skin once a week. 0.25 mg once weekly for 4 weeks then increase to 0.5 mg weekly for at least 4 weeks,max 1 mg (Patient not taking: Reported on 12/26/2021), Disp: 3 mL, Rfl: 1   Vitamin D, Ergocalciferol, (DRISDOL) 1.25 MG (50000 UNIT) CAPS capsule, Take 50,000 Units by mouth once a week. (Patient not taking: Reported on 12/16/2021), Disp: , Rfl:   Current Facility-Administered Medications:    acetaminophen (TYLENOL) tablet 975 mg, 975 mg, Oral, Once, Eloise Levels, MD    PERTINENT  PMH / PSH/Family/Social History : CAD, HTN, CKD IIIb---follows with Dr. Royce Macadamia  OBJECTIVE:   BP (!) 143/78   Pulse 61   Wt 287 lb (130.2 kg)   SpO2 98%   BMI 46.32 kg/m   Today's weight:  Last Weight  Most recent update: 12/26/2021  9:27 AM    Weight  130.2 kg (287 lb)            Review of prior weights: [employer]   12/26/21 0926  Weight: 287 lb (130.2 kg)     Cardiac: Regular rate and rhythm. Normal S1/S2. No murmurs, rubs, or gallops appreciated. 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 Garner Gavel continue to discuss   Morbid obesity A1C today Does not like weight gain associated with tobacco  cessation  Pending A1C, will consider semaglutide again   HLD (hyperlipidemia) Doing well with Repatha    CKD IIIB Has follow up with Dr. Malen Gauze 11/8 Requests labs be drawn here today Labs obtained and will send to Dr. Malen Gauze and her office ([facility])   HCM PCV20 and flu today   At follow up---discuss PFT      [name], MD  Family Medicine Teaching Service  [facility]

## 2021-12-26 NOTE — Assessment & Plan Note (Signed)
Discussed pulmonary medicine visit Congratulated on cutting down on tobacco PFT in coming months with Dr. Victoriano Lain continue to discuss

## 2021-12-27 LAB — RENAL FUNCTION PANEL
Albumin: 4.2 g/dL (ref 3.9–4.9)
BUN/Creatinine Ratio: 18 (ref 12–28)
BUN: 28 mg/dL — ABNORMAL HIGH (ref 8–27)
CO2: 19 mmol/L — ABNORMAL LOW (ref 20–29)
Calcium: 9 mg/dL (ref 8.7–10.3)
Chloride: 104 mmol/L (ref 96–106)
Creatinine, Ser: 1.56 mg/dL — ABNORMAL HIGH (ref 0.57–1.00)
Glucose: 120 mg/dL — ABNORMAL HIGH (ref 70–99)
Phosphorus: 3.7 mg/dL (ref 3.0–4.3)
Potassium: 4.4 mmol/L (ref 3.5–5.2)
Sodium: 141 mmol/L (ref 134–144)
eGFR: 37 mL/min/{1.73_m2} — ABNORMAL LOW (ref >59)

## 2021-12-27 LAB — IRON,TIBC AND FERRITIN PANEL
Ferritin: 305 ng/mL — ABNORMAL HIGH (ref 15–150)
Iron Saturation: 17 % (ref 15–55)
Iron: 48 ug/dL (ref 27–139)
Total Iron Binding Capacity: 277 ug/dL (ref 250–450)
UIBC: 229 ug/dL (ref 118–369)

## 2021-12-27 LAB — HEPATIC FUNCTION PANEL
ALT: 11 IU/L (ref 0–32)
AST: 21 IU/L (ref 0–40)
Alkaline Phosphatase: 129 IU/L — ABNORMAL HIGH (ref 44–121)
Bilirubin Total: 0.2 mg/dL (ref 0.0–1.2)
Bilirubin, Direct: 0.11 mg/dL (ref 0.00–0.40)
Total Protein: 7.1 g/dL (ref 6.0–8.5)

## 2021-12-27 LAB — CBC
Hematocrit: 38.4 % (ref 34.0–46.6)
Hemoglobin: 12 g/dL (ref 11.1–15.9)
MCH: 26.2 pg — ABNORMAL LOW (ref 26.6–33.0)
MCHC: 31.3 g/dL — ABNORMAL LOW (ref 31.5–35.7)
MCV: 84 fL (ref 79–97)
Platelets: 239 10*3/uL (ref 150–450)
RBC: 4.58 x10E6/uL (ref 3.77–5.28)
RDW: 13 % (ref 11.7–15.4)
WBC: 5.9 10*3/uL (ref 3.4–10.8)

## 2021-12-27 LAB — VITAMIN D 25 HYDROXY (VIT D DEFICIENCY, FRACTURES): Vit D, 25-Hydroxy: 18.5 ng/mL — ABNORMAL LOW (ref 30.0–100.0)

## 2021-12-27 LAB — HEMOGLOBIN A1C
Est. average glucose Bld gHb Est-mCnc: 103 mg/dL
Hgb A1c MFr Bld: 5.2 % (ref 4.8–5.6)

## 2021-12-28 LAB — MICROALBUMIN / CREATININE URINE RATIO
Creatinine, Urine: 331.9 mg/dL
Microalb/Creat Ratio: 7 mg/g creat (ref 0–29)
Microalbumin, Urine: 23.2 ug/mL

## 2021-12-29 ENCOUNTER — Telehealth: Payer: Self-pay | Admitting: Family Medicine

## 2021-12-29 NOTE — Telephone Encounter (Signed)
Attempted to call patient. Reached voicemail, left generic voicemail to call back.   Ramonda Galyon, MD  Family Medicine Teaching Service   

## 2021-12-30 ENCOUNTER — Telehealth: Payer: Self-pay | Admitting: Family Medicine

## 2021-12-30 MED ORDER — SEMAGLUTIDE(0.25 OR 0.5MG/DOS) 2 MG/1.5ML ~~LOC~~ SOPN: 0.2500 mg | PEN_INJECTOR | SUBCUTANEOUS | 1 refills | Status: DC

## 2021-12-30 NOTE — Telephone Encounter (Signed)
Called with results. Already faxed to Dr. Gilberto Better get GGT at follow up.  Patient very interested in weight loss with semaglutide. Discussed side effects, sent in new Rx.  Dorris Singh, MD  Family Medicine Teaching Service

## 2022-01-09 ENCOUNTER — Other Ambulatory Visit: Payer: Self-pay

## 2022-01-09 MED ORDER — CYCLOBENZAPRINE HCL 5 MG PO TABS: 5.0000 mg | ORAL_TABLET | Freq: Three times a day (TID) | ORAL | 1 refills | Status: DC | PRN

## 2022-01-13 ENCOUNTER — Other Ambulatory Visit: Payer: Self-pay | Admitting: *Deleted

## 2022-01-13 ENCOUNTER — Other Ambulatory Visit: Payer: Self-pay | Admitting: Family Medicine

## 2022-01-13 MED ORDER — FUROSEMIDE 20 MG PO TABS: ORAL_TABLET | ORAL | 3 refills | Status: DC

## 2022-01-13 NOTE — Telephone Encounter (Signed)
Refilled at visit

## 2022-02-03 ENCOUNTER — Ambulatory Visit: Payer: Medicare Other | Attending: Cardiology

## 2022-02-03 DIAGNOSIS — E782 Mixed hyperlipidemia: Secondary | ICD-10-CM

## 2022-02-05 LAB — LIPID PANEL
Chol/HDL Ratio: 2.1 ratio (ref 0.0–4.4)
Cholesterol, Total: 140 mg/dL (ref 100–199)
HDL: 66 mg/dL (ref >39)
LDL Chol Calc (NIH): 56 mg/dL (ref 0–99)
Triglycerides: 99 mg/dL (ref 0–149)
VLDL Cholesterol Cal: 18 mg/dL (ref 5–40)

## 2022-02-05 LAB — APOLIPOPROTEIN B: Apolipoprotein B: 58 mg/dL (ref <90)

## 2022-02-06 ENCOUNTER — Encounter: Payer: Self-pay | Admitting: Family Medicine

## 2022-02-06 ENCOUNTER — Other Ambulatory Visit: Payer: Self-pay

## 2022-02-06 ENCOUNTER — Ambulatory Visit (INDEPENDENT_AMBULATORY_CARE_PROVIDER_SITE_OTHER): Payer: Medicare Other | Admitting: Family Medicine

## 2022-02-06 VITALS — BP 159/81 | HR 62 | Wt 292.8 lb

## 2022-02-06 DIAGNOSIS — I10 Essential (primary) hypertension: Secondary | ICD-10-CM | POA: Diagnosis not present

## 2022-02-06 DIAGNOSIS — F17200 Nicotine dependence, unspecified, uncomplicated: Secondary | ICD-10-CM | POA: Diagnosis not present

## 2022-02-06 DIAGNOSIS — Z23 Encounter for immunization: Secondary | ICD-10-CM

## 2022-02-06 DIAGNOSIS — N1832 Chronic kidney disease, stage 3b: Secondary | ICD-10-CM | POA: Diagnosis not present

## 2022-02-06 MED ORDER — TETANUS-DIPHTH-ACELL PERTUSSIS 5-2.5-18.5 LF-MCG/0.5 IM SUSY: 0.5000 mL | PREFILLED_SYRINGE | Freq: Once | INTRAMUSCULAR | 0 refills | Status: AC

## 2022-02-06 NOTE — Assessment & Plan Note (Signed)
-   Unable to afford GLP1 agonist - Referred to Bariatric Surgery, discussed options

## 2022-02-06 NOTE — Progress Notes (Signed)
    SUBJECTIVE:   CHIEF COMPLAINT: follow up tobacco cessation  HPI:   Tammy [name] is a 66 y.o.  with history notable for hypertension, CKDIIIB , peripheral artery disease, coronary disease and morbid obesity presenting for follow-up for tobacco cessation.  The patient reports she has had a plateau in her tobacco cessation.  She reports the major barrier is her eating habits.  She is not particular interested in medication at this time.  She would be interested in considering bariatric surgery.  She is smoking only 3 to 4 cigarettes a day.  She mostly chews sugar-free gum but has noted she is eating more.  The patient reports compliance with her medications.  She did not yet take her blood pressure medications this morning.  She denies headache chest pain or dyspnea.  Blood pressure was at goal at recent nephrology visit.  She reports increased urine output with Lasix 3 times a week.  Patient recently had her APO lipoprotein and LDL drawn.  Both were well below goal.  Congratulated on her lifestyle changes she wonders how long she has to continue Repatha for.  PERTINENT  PMH / PSH/Family/Social History : Updated and reviewed as appropriate  OBJECTIVE:   BP (!) 159/81   Pulse 62   Wt 292 lb 12.8 oz (132.8 kg)   SpO2 100%   BMI 47.26 kg/m   Today's weight:  Last Weight  Most recent update: 02/06/2022  8:46 AM    Weight  132.8 kg (292 lb 12.8 oz)            Review of prior weights: [employer]   02/06/22 0845  Weight: 292 lb 12.8 oz (132.8 kg)   Cardiac: Regular rate and rhythm. Normal S1/S2. No murmurs, rubs, or gallops appreciated. Lungs: Clear bilaterally to ascultation. Psych: Pleasant and appropriate    ASSESSMENT/PLAN:   Essential hypertension - Not at goal today as did not take medications - Follow up scheduled--she will take medications before next visit - Has follow up with Nephrology - Continue Hydralazine, amlodipine, metoprolol    CKD (chronic  kidney disease), stage III (HCC) Follows with Dr. Malen Gauze Recently added Lasix  Tolerating well   Morbid obesity - Unable to afford GLP1 agonist - Referred to Bariatric Surgery, discussed options   TOBACCO DEPENDENCE - Congratulated on reduction in cigarette intake    HCM Deferred COVID  Tdap Rx given   At follow up will needs CKD labs      [name], MD  Family Medicine Teaching Service  [facility]

## 2022-02-06 NOTE — Assessment & Plan Note (Signed)
-   Congratulated on reduction in cigarette intake

## 2022-02-06 NOTE — Patient Instructions (Signed)
It was wonderful to see you today.  Please bring ALL of your medications with you to every visit.   Today we talked about:   --Continuing all of your medications   -- You will be called by the Bariatric Surgery office---we can postpone until the spring if you like  GREAT WORK WITH TOBACCO CESSATION   Your cholesterol number looks great   Please follow up in 3 months   Thank you for choosing [facility].   Please call [phone] with any questions about today's appointment.  Please be sure to schedule follow up at the front  desk before you leave today.   [name], MD  Family Medicine

## 2022-02-06 NOTE — Assessment & Plan Note (Signed)
Follows with Dr. Malen Gauze Recently added Lasix  Tolerating well

## 2022-02-06 NOTE — Assessment & Plan Note (Addendum)
-   Not at goal today as did not take medications - Follow up scheduled--she will take medications before next visit - Has follow up with Nephrology - Continue Hydralazine, amlodipine, metoprolol

## 2022-03-30 ENCOUNTER — Other Ambulatory Visit: Payer: Self-pay | Admitting: Family Medicine

## 2022-03-30 DIAGNOSIS — R928 Other abnormal and inconclusive findings on diagnostic imaging of breast: Secondary | ICD-10-CM

## 2022-04-10 ENCOUNTER — Other Ambulatory Visit: Payer: Medicare Other

## 2022-04-16 ENCOUNTER — Other Ambulatory Visit: Payer: Self-pay

## 2022-04-16 DIAGNOSIS — F17200 Nicotine dependence, unspecified, uncomplicated: Secondary | ICD-10-CM

## 2022-04-16 MED ORDER — NICOTINE POLACRILEX 2 MG MT GUM: 2.0000 mg | CHEWING_GUM | OROMUCOSAL | 0 refills | Status: DC | PRN

## 2022-04-20 NOTE — Telephone Encounter (Signed)
Open in error

## 2022-04-23 NOTE — Telephone Encounter (Signed)
Approved through 06/26/22

## 2022-05-04 DIAGNOSIS — N281 Cyst of kidney, acquired: Secondary | ICD-10-CM | POA: Diagnosis not present

## 2022-05-04 DIAGNOSIS — N2581 Secondary hyperparathyroidism of renal origin: Secondary | ICD-10-CM | POA: Diagnosis not present

## 2022-05-04 DIAGNOSIS — N1832 Chronic kidney disease, stage 3b: Secondary | ICD-10-CM | POA: Diagnosis not present

## 2022-05-04 DIAGNOSIS — Z720 Tobacco use: Secondary | ICD-10-CM | POA: Diagnosis not present

## 2022-05-04 DIAGNOSIS — E559 Vitamin D deficiency, unspecified: Secondary | ICD-10-CM | POA: Diagnosis not present

## 2022-05-04 DIAGNOSIS — E872 Acidosis, unspecified: Secondary | ICD-10-CM | POA: Diagnosis not present

## 2022-05-04 DIAGNOSIS — I2510 Atherosclerotic heart disease of native coronary artery without angina pectoris: Secondary | ICD-10-CM | POA: Diagnosis not present

## 2022-05-04 DIAGNOSIS — I129 Hypertensive chronic kidney disease with stage 1 through stage 4 chronic kidney disease, or unspecified chronic kidney disease: Secondary | ICD-10-CM | POA: Diagnosis not present

## 2022-05-05 LAB — LAB REPORT - SCANNED
Creatinine, POC: 215.5 mg/dL
Protein/Creatinine Ratio: 52
Total Protein, Urine: 11.2

## 2022-05-14 NOTE — Progress Notes (Signed)
    SUBJECTIVE:   CHIEF COMPLAINT: BP follow up HPI:   Tammy [name] is a 67 y.o.  with history notable for CKD IIIb, HTN, and tobacco use presenting for follow up.   She reports overall she is frustrated by her weight. This has stalled her tobacco cessation. She feels her portion size growing and has gained 6 pounds since her last visit. She is scared of bariatric surgery. Interested in Tammy [name]. Tammy [name] was not covered by her insurance  Elevated fasting BG The patient had an elevated fasting BG of 189 at Nephrology. Has had previously elevated values but A1C in prediabetic range. She is trying multiple lifestyle changes, including chair exercises and trying to walk.   Hypertension BP goal: 130/80 Current medications: Hydralazine, Lasix, Metoprolol and amlodipine  Side effects:None  Adherence: Did not take this AM   Denies chest pain, headaches, dyspnea or vision changes  Patient endorses anxiety and stress today related to health and weight.  PERTINENT  PMH / PSH/Family/Social History : HTN, CKD IIIB, Prediabetes  OBJECTIVE:   BP (!) 215/90   Pulse 84   Ht 5\' 6"  (1.676 m)   Wt 298 lb 9.6 oz (135.4 kg)   SpO2 99%   BMI 48.20 kg/m   Today's weight:  Last Weight  Most recent update: 05/15/2022  9:53 AM    Weight  135.4 kg (298 lb 9.6 oz)            Review of prior weights: [employer]   05/15/22 0952  Weight: 298 lb 9.6 oz (135.4 kg)    Cardiac: Regular rate and rhythm. Normal S1/S2. No murmurs, rubs, or gallops appreciated. Lungs: Clear bilaterally to ascultation.  Abdomen: Normoactive bowel sounds. No tenderness to deep or light palpation. No rebound or guarding.    Psych: Pleasant and appropriate    ASSESSMENT/PLAN:   TOBACCO DEPENDENCE Discussed and encouraged cessation  Limited by weight gain   HLD (hyperlipidemia) Has allergy to statin (was in ED after starting Crestor)  Continue repatha   Essential hypertension Not at  goal Discussed Scheduled close follow up for time after she takes medications Discussed adherence Increase hydralazine at follow up if not at goal  Consider 24 hour monitor She will bring in home monitor    Elevated fasting BG Suspect she has developed diabetes A1C today Start Ozempic  Follow up 1 month for titration No family history of medullary thyroid cancer   Elevated ALP Up to date on cancer screening Possibly due to steatosis GGT and repeat today   CKD IIIB CBC today Reviewed Tammy [name] labs with her today  HCM  Discussed AWV   At follow up- consider hydralazine increase, 24 hour monitor, low dose CT due in July      Tammy [name], [place]

## 2022-05-15 ENCOUNTER — Encounter: Payer: Self-pay | Admitting: Family Medicine

## 2022-05-15 ENCOUNTER — Ambulatory Visit (INDEPENDENT_AMBULATORY_CARE_PROVIDER_SITE_OTHER): Payer: 59 | Admitting: Family Medicine

## 2022-05-15 ENCOUNTER — Other Ambulatory Visit: Payer: Self-pay

## 2022-05-15 VITALS — BP 215/90 | HR 84 | Ht 66.0 in | Wt 298.6 lb

## 2022-05-15 DIAGNOSIS — R7301 Impaired fasting glucose: Secondary | ICD-10-CM

## 2022-05-15 DIAGNOSIS — I10 Essential (primary) hypertension: Secondary | ICD-10-CM | POA: Diagnosis not present

## 2022-05-15 DIAGNOSIS — F17200 Nicotine dependence, unspecified, uncomplicated: Secondary | ICD-10-CM

## 2022-05-15 DIAGNOSIS — I739 Peripheral vascular disease, unspecified: Secondary | ICD-10-CM

## 2022-05-15 DIAGNOSIS — F17209 Nicotine dependence, unspecified, with unspecified nicotine-induced disorders: Secondary | ICD-10-CM

## 2022-05-15 DIAGNOSIS — I2510 Atherosclerotic heart disease of native coronary artery without angina pectoris: Secondary | ICD-10-CM | POA: Diagnosis not present

## 2022-05-15 DIAGNOSIS — Z888 Allergy status to other drugs, medicaments and biological substances status: Secondary | ICD-10-CM

## 2022-05-15 DIAGNOSIS — E782 Mixed hyperlipidemia: Secondary | ICD-10-CM

## 2022-05-15 MED ORDER — SEMAGLUTIDE(0.25 OR 0.5MG/DOS) 2 MG/1.5ML ~~LOC~~ SOPN: 0.2500 mg | PEN_INJECTOR | SUBCUTANEOUS | 1 refills | Status: DC

## 2022-05-15 NOTE — Assessment & Plan Note (Signed)
Not at goal Discussed Scheduled close follow up for time after she takes medications Discussed adherence Increase hydralazine at follow up if not at goal  Consider 24 hour monitor She will bring in home monitor

## 2022-05-15 NOTE — Assessment & Plan Note (Signed)
Discussed and encouraged cessation  Limited by weight gain

## 2022-05-15 NOTE — Assessment & Plan Note (Signed)
Has allergy to statin (was in ED after starting Crestor)  Continue repatha

## 2022-05-15 NOTE — Patient Instructions (Addendum)
It was wonderful to see you today.  Please bring ALL of your medications with you to every visit.   Today we talked about:   For your sugar level: We will check a hemoglobin A1C --this is a 3 month marker of sugar I have sent in [place] (also called Wegovy) to your pharmacy You can call to schedule at Healthy weight and wellness [phone]   We will check your liver tests again   I will call you with results  Continue your medications   Let's schedule your next visit after you take your blood pressure pills   Please follow up in 1 month   Thank you for choosing [facility].   Please call [phone] with any questions about today's appointment.  Please be sure to schedule follow up at the front  desk before you leave today.   Dorris Singh, MD  Family Medicine

## 2022-05-16 LAB — CBC
Hematocrit: 42.9 % (ref 34.0–46.6)
Hemoglobin: 13.5 g/dL (ref 11.1–15.9)
MCH: 27 pg (ref 26.6–33.0)
MCHC: 31.5 g/dL (ref 31.5–35.7)
MCV: 86 fL (ref 79–97)
Platelets: 235 10*3/uL (ref 150–450)
RBC: 5 x10E6/uL (ref 3.77–5.28)
RDW: 12.2 % (ref 11.7–15.4)
WBC: 5.2 10*3/uL (ref 3.4–10.8)

## 2022-05-16 LAB — HEPATIC FUNCTION PANEL
ALT: 10 IU/L (ref 0–32)
AST: 18 IU/L (ref 0–40)
Albumin: 4.6 g/dL (ref 3.9–4.9)
Alkaline Phosphatase: 118 IU/L (ref 44–121)
Bilirubin Total: 0.4 mg/dL (ref 0.0–1.2)
Bilirubin, Direct: 0.15 mg/dL (ref 0.00–0.40)
Total Protein: 7.4 g/dL (ref 6.0–8.5)

## 2022-05-16 LAB — GAMMA GT: GGT: 43 IU/L (ref 0–60)

## 2022-05-16 LAB — HEMOGLOBIN A1C
Est. average glucose Bld gHb Est-mCnc: 114 mg/dL
Hgb A1c MFr Bld: 5.6 % (ref 4.8–5.6)

## 2022-05-19 ENCOUNTER — Telehealth: Payer: Self-pay | Admitting: Family Medicine

## 2022-05-19 NOTE — Telephone Encounter (Signed)
Attempted to call patient. Reached voicemail, left generic voicemail to call back.  If she calls back please let her know her labs look good. No changes at this time.    I will try her again tomorrow between 11-12.   Dorris Singh, MD  Family Medicine Teaching Service

## 2022-05-20 ENCOUNTER — Telehealth: Payer: Self-pay | Admitting: Family Medicine

## 2022-05-20 NOTE — Telephone Encounter (Signed)
Called with results. All questions.   Dorris Singh, MD  Family Medicine Teaching Service

## 2022-05-21 ENCOUNTER — Ambulatory Visit: Payer: Medicare Other

## 2022-05-21 ENCOUNTER — Ambulatory Visit
Admission: RE | Admit: 2022-05-21 | Discharge: 2022-05-21 | Disposition: A | Discharge status: Home / Self Care | Payer: 59 | Source: Ambulatory Visit | Attending: Family Medicine | Admitting: Family Medicine

## 2022-05-21 ENCOUNTER — Encounter: Payer: Self-pay | Admitting: Family Medicine

## 2022-05-21 ENCOUNTER — Telehealth: Payer: Self-pay

## 2022-05-21 DIAGNOSIS — R928 Other abnormal and inconclusive findings on diagnostic imaging of breast: Secondary | ICD-10-CM | POA: Diagnosis not present

## 2022-05-21 NOTE — Telephone Encounter (Signed)
Rec'd PA request for patients Ozempic 0.25mg /0.5mg  dose pens.  Dx for weight loss not appropriate for medicare insurance - weight loss meds not covered.

## 2022-05-25 ENCOUNTER — Telehealth: Payer: Self-pay

## 2022-05-25 DIAGNOSIS — I2510 Atherosclerotic heart disease of native coronary artery without angina pectoris: Secondary | ICD-10-CM

## 2022-05-25 DIAGNOSIS — I10 Essential (primary) hypertension: Secondary | ICD-10-CM

## 2022-05-25 DIAGNOSIS — I739 Peripheral vascular disease, unspecified: Secondary | ICD-10-CM

## 2022-05-25 MED ORDER — SEMAGLUTIDE(0.25 OR 0.5MG/DOS) 2 MG/1.5ML ~~LOC~~ SOPN: 0.2500 mg | PEN_INJECTOR | SUBCUTANEOUS | 1 refills | Status: DC

## 2022-05-25 NOTE — Telephone Encounter (Signed)
Patient calls nurse line requesting to speak with Dr. Owens Shark regarding two concerns.  She reports that over the last week she has noticed a darker color to her urine. States that it is dark yellowish brown. She states that she has been drinking plenty of water and mostly notices this with first morning urine. She denies odor, back or abdominal pain, fever or painful urination. She is asking if any of her medications could be contributing to the change in color. Advised that she would likely need to come in to provide a urine sample. She is requesting message be sent to PCP prior to scheduling.  She is asking about status of Ozempic. Advised that per PA request, Medicare does not cover weight loss drugs. She is requesting PCP recommendations.  Please advise.   Talbot Grumbling, RN

## 2022-05-25 NOTE — Telephone Encounter (Signed)
Called to discuss. Do not suspect UTI or medication as cause. She is urinating a normal amount.  Given coverage changes for Medicare, new Rx for semaglutide, hopeful it will be covered given history of CAD and PAD All questions ansred She will call back if urine symptoms change  Dorris Singh, MD  [facility] Medicine Teaching Service

## 2022-06-18 NOTE — Progress Notes (Signed)
    SUBJECTIVE:   CHIEF COMPLAINT: BP check HPI:   Tammy [name] is a 67 y.o.  with history notable for CKD, HTN, and tobacco use presenting for follow up.  She reports that she does not have Ozempic. She is very interested in losing weight. She is making dietary changes, down 4 pounds today. She is very scared of bariatric surgery. Last A1C 5.6, but may be spuriously low due to CKD.   Hypertension BP goal: 130/80 Current medications: hydralazine, amlodipine, metoprolol, and furosemide  Side effects: none Adherence: excellent Brings her home monitor today. She does not know how to use this.   She reports no other concerns today. She is planning a seafood boil for her birthday.      PERTINENT  PMH / PSH/Family/Social History : updated and reviewed   OBJECTIVE:   BP (!) 149/95   Pulse (!) 59   Ht  (1.676 m)   Wt 293 lb 6.4 oz (133.1 kg)   SpO2 100%   BMI 47.36 kg/m   Today's weight:  Last Weight  Most recent update: 06/19/2022 11:08 AM    Weight  133.1 kg (293 lb 6.4 oz)            Review of prior weights: Filed Weights   06/19/22 1108  Weight: 293 lb 6.4 oz (133.1 kg)     Cardiac: Regular rate and rhythm. Normal S1/S2. No murmurs, rubs, or gallops appreciated. Lungs: Clear on L but has rhonchi in RUL no wheezes or rales Psych: Pleasant and appropriate  No edema    ASSESSMENT/PLAN:   Essential hypertension Has Omron cuff she brought with her today Reports readings at home lower than here Validated patients cuff today Will send in home readings--likely need to increase hydralazine   CAD (coronary artery disease) Semaglutide was sent in previously given CAD On ASA and plavix  Not on a statin due to swelling with this    Obesity Given A1C and central adiposity will obtain insulin level Called pharmacy as medicare/medicaid is to now cover this medication for patients with preexisting CAD   Intermittent Cough with tobacco use, asymmetric lung  sounds CXR today If persists will obtain CT Due for CT in July   Tobacco use Discussed and encouraged cessation--down to  5 per day    HCM COVID booster at follow up Messaged about AWV     [name], MD  Family Medicine Teaching Service  [address] [facility]

## 2022-06-18 NOTE — Progress Notes (Signed)
Cardiology Office Note:    Date:  06/30/2022   ID:  Tammy [name], DOB 11/12/1955, MRN [id number]  PCP:  Westley Chandler, MD   [facility] Providers Cardiologist:  Meriam Sprague, MD   Referring MD: Westley Chandler, MD    History of Present Illness:    Tammy [name] is a 67 y.o. female with a hx of  hypertension, hyperlipidemia, PAD, CAD s/p NSTEMI (medically managed), obesity and tobacco abuse who was previously followed by Dr. Elease Hashimoto who now presents to clinic for follow-up.  Patient was admitted in 07/2014 with chest pain and elevated troponin. She underwent LHC which showed mild-to-moderate nonobstructive disease and she was managed medically.  Was last seen in clinic on 08/2021 where she was having stable claudication symptoms. Was otherwise stable from a CV standpoint. TTE obtained due to concern for PHTN which showed LVEF 55% with inferior hypokinesis, normal RV, no significant valve disease. Follow-up myoview with normal perfusion.  Today, the patient overall feels okay. She is working on weight loss with Dr. Manson Passey. She is cutting back on smoking but has noticed weight gain which is very frustrating to her. She has not been eligible for GLP-1 due to lack of insurance coverage.   Otherwise, she is doing well from a CV standpoint. No chest pain, SOB, orthopnea, LE edema, palpitations, or lightheadedness. Trying to remain active and does chair exercises and is walking some but she develops claudication after walking for 5 minutes. She states she is willing to pursue further work-up of PAD at this time.  Blood pressure is running 140s mainly at home.   Past Medical History:  Diagnosis Date   Abnormal mammogram 10.25.11   area of density with several adjacent amorphous calcifications located laterally w/in the right breast at approx 9-10 o'clock position likely represents area of evolving far necrosis; f/u mamo and U/S in 6 months   CAD (coronary artery  disease)    a. 07/2014 NSTEMI/Cath: LM nl, LAD 30ost/m, 60d, LCX 20p, OM1 20, OM2 40, OM3 50, RCA 30p, 63m, 20d, RPDA 40, EF 65%-->Med Rx;  b. 07/2014 Echo: EF 55-60%, no rwma, mildly dil LA.   Chronic kidney disease    Essential hypertension    GERD (gastroesophageal reflux disease)    Hyperlipidemia    Morbid obesity (HCC)    PAD (peripheral artery disease) (HCC)    a. 07/2014 ABI's: R 0.88, L 0.32.   Ulcer     Past Surgical History:  Procedure Laterality Date   BREAST BIOPSY Left 09/01/2018   BREAST BIOPSY Left 10/07/2021   CARDIAC CATHETERIZATION N/A 07/16/2014   Procedure: Left Heart Cath and Coronary Angiography;  Surgeon: Kathleene Hazel, MD;  Location: [facility] INVASIVE CV LAB;  Service: Cardiovascular;  Laterality: N/A;   CESAREAN SECTION     PERIPHERAL VASCULAR CATHETERIZATION N/A 02/12/2015   Procedure: Abdominal Aortogram;  Surgeon: Nada Libman, MD;  Location: [facility] INVASIVE CV LAB;  Service: Cardiovascular;  Laterality: N/A;   TRIGGER FINGER RELEASE Left    middle finger   UPPER GASTROINTESTINAL ENDOSCOPY  1991    Current Medications: Current Meds  Medication Sig   amLODipine (NORVASC) 10 MG tablet Take 1 tablet (10 mg total) by mouth daily.   aspirin EC 81 MG tablet Take 1 tablet (81 mg total) by mouth daily.   clindamycin (CLEOCIN-T) 1 % external solution Apply topically 2 (two) times daily. Apply to lesion on mons pubis twice daily for 1 week  clopidogrel (PLAVIX) 75 MG tablet Take 1 tablet (75 mg total) by mouth daily.   cyclobenzaprine (FLEXERIL) 5 MG tablet Take 1 tablet (5 mg total) by mouth 3 (three) times daily as needed.   Evolocumab (REPATHA SURECLICK) 140 MG/ML SOAJ Inject 1 Pen into the skin every 14 (fourteen) days.   ezetimibe (ZETIA) 10 MG tablet Take 1 tablet (10 mg total) by mouth at bedtime.   furosemide (LASIX) 20 MG tablet MWF   hydrALAZINE (APRESOLINE) 50 MG tablet Take 1.5 tablets (75 mg total) by mouth 2 (two) times daily.   Insulin Pen  Needle 31G X 8 MM MISC Use with Wegovy   ketoconazole (NIZORAL) 2 % cream Apply 1 application. topically daily.   metoprolol tartrate (LOPRESSOR) 100 MG tablet Take 1 tablet (100 mg total) by mouth 2 (two) times daily.   nicotine polacrilex (NICORETTE) 2 MG gum Take 1 each (2 mg total) by mouth as needed for smoking cessation.   nitroGLYCERIN (NITROSTAT) 0.4 MG SL tablet Place 1 tablet (0.4 mg total) under the tongue every 5 (five) minutes as needed for chest pain.   Semaglutide,0.25 or 0.5MG /DOS, 2 MG/1.5ML SOPN Inject 0.25 mg into the skin once a week. 0.25 mg once weekly for 4 weeks then increase to 0.5 mg weekly for at least 4 weeks,max 1 mg   sodium bicarbonate 650 MG tablet Take 650 mg by mouth 2 (two) times daily.   Vitamin D, Ergocalciferol, (DRISDOL) 1.25 MG (50000 UNIT) CAPS capsule Take 50,000 Units by mouth once a week.   [DISCONTINUED] hydrALAZINE (APRESOLINE) 50 MG tablet Take 1 tablet (50 mg total) by mouth in the morning and at bedtime.   Current Facility-Administered Medications for the 06/30/22 encounter (Office Visit) with Meriam Sprague, MD  Medication   acetaminophen (TYLENOL) tablet 975 mg     Allergies:   Ace inhibitors, Hydrochlorothiazide, Losartan, and Rosuvastatin   Social History   Socioeconomic History   Marital status: Single    Spouse name: Not on file   Number of children: Not on file   Years of education: Not on file   Highest education level: Not on file  Occupational History   Not on file  Tobacco Use   Smoking status: Every Day    Packs/day: 0.25    Years: 30.00    Additional pack years: 0.00    Total pack years: 7.50    Types: Cigarettes   Smokeless tobacco: Never   Tobacco comments:    8-10 cigarettes per day or more  Substance and Sexual Activity   Alcohol use: No    Alcohol/week: 0.0 standard drinks of alcohol   Drug use: No   Sexual activity: Not Currently    Birth control/protection: Post-menopausal, Abstinence  Other Topics  Concern   Not on file  Social History Narrative   Not on file   Social Determinants of Health   Financial Resource Strain: Not on file  Food Insecurity: Not on file  Transportation Needs: Not on file  Physical Activity: Not on file  Stress: Not on file  Social Connections: Not on file     Family History: The patient's family history includes Breast cancer in her maternal aunt; Cancer in her maternal aunt and mother; Diabetes in her father and mother; Hypertension in her brother, father, and sister; Ovarian cancer in her maternal aunt.  ROS:   Please see the history of present illness.     All other systems reviewed and are negative.  EKGs/Labs/Other Studies Reviewed:  The following studies were reviewed today: Cardiac Studies & Procedures   CARDIAC CATHETERIZATION  CARDIAC CATHETERIZATION 07/16/2014  Narrative  Prox RCA lesion, 30% stenosed.  Mid RCA lesion, 60% stenosed.  RPDA lesion, 40% stenosed.  Post Atrio lesion, 20% stenosed.  Dist RCA lesion, 20% stenosed.  Prox Cx to Mid Cx lesion, 20% stenosed.  1st Mrg lesion, 20% stenosed.  2nd Mrg lesion, 40% stenosed.  Ost 3rd Mrg to 3rd Mrg lesion, 50% stenosed.  Ost LAD to Prox LAD lesion, 30% stenosed.  Mid LAD lesion, 30% stenosed.  Dist LAD lesion, 60% stenosed.  1. Diffuse moderate triple vessel CAD without focal culprit lesion for NSTEMI. 2. Moderate stenosis mid RCA which does not appear to be a ruptured plaque and is not flow limiting 3. Moderate stenosis in the small caliber distal LAD which does not appear to be a ruptured plaque and is not flow limiting. 4. Moderate disease in the small caliber third obtuse marginal branch 5. Normal LV function 6. NSTEMI with diffuse moderate CAD but no focal culprit lesion  Recommendations: Will manage with medical therapy. I will load with Plavix today. She will need at least 3 months of dual anti-platelet therapy with ASA and Plavix given presentation as  acute coronary syndrome. Continue beta blocker and statin. Aggressive risk factor modification including weight loss, diet changes, control of lipids and BP. Would monitor x 12-24 more hours. Follow up after discharge with Dr. Delton See.  Findings Coronary Findings Diagnostic  Dominance: Right  Left Anterior Descending diffuse . diffuse . discrete .  The distal vessel become small in caliber and is diffusely diseased.  Left Circumflex diffuse .  First Obtuse Marginal Branch The vessel is small in size. diffuse .  Second Obtuse Marginal Branch diffuse .  Third Obtuse Marginal Branch The vessel is small in size. diffuse .  Right Coronary Artery diffuse . discrete . diffuse .  Right Posterior Descending Artery diffuse .  Right Posterior Atrioventricular Artery diffuse .  Intervention  No interventions have been documented.   STRESS TESTS  MYOCARDIAL PERFUSION IMAGING 11/27/2021  Narrative   The study is normal. The study is low risk.   No ST deviation was noted.   LV perfusion is normal.   Left ventricular function is normal. Nuclear stress EF: 57 %. The left ventricular ejection fraction is normal (55-65%). End diastolic cavity size is normal. End systolic cavity size is normal.   Prior study not available for comparison.   ECHOCARDIOGRAM  ECHOCARDIOGRAM COMPLETE 10/28/2021  Narrative ECHOCARDIOGRAM REPORT    Patient Name:   Tammy [name] Date of Exam: 10/28/2021 Medical Rec #:  [id number]        Height:       66.0 in Accession #:    [id number]       Weight:       271.8 lb Date of Birth:  June 14, 1955        BSA:          2.278 m Patient Age:    66 years         BP:           140/86 mmHg Patient Gender: F                HR:           45 bpm. Exam Location:  [address]  Procedure: 2D Echo, Cardiac Doppler and Color Doppler  Indications:    I25.10 CAD  History:  Patient has prior history of Echocardiogram examinations, most recent 07/16/2014. CAD  and Previous Myocardial Infarction; Risk Factors:Hypertension, Dyslipidemia, Morbid obesity and Current Smoker.  Sonographer:    Samule Ohm RDCS Referring Phys: [id number] Meriam Sprague   Sonographer Comments: Patient is obese. IMPRESSIONS   1. Posterior basal hypokinesis . Left ventricular ejection fraction, by estimation, is 55%. The left ventricle has normal function. The left ventricle demonstrates regional wall motion abnormalities (see scoring diagram/findings for description). There is mild left ventricular hypertrophy. Left ventricular diastolic parameters were normal. 2. Right ventricular systolic function is normal. The right ventricular size is normal. 3. The mitral valve is abnormal. Mild mitral valve regurgitation. No evidence of mitral stenosis. 4. The aortic valve is normal in structure. Aortic valve regurgitation is not visualized. No aortic stenosis is present. 5. The inferior vena cava is normal in size with greater than 50% respiratory variability, suggesting right atrial pressure of 3 mmHg.  FINDINGS Left Ventricle: Posterior basal hypokinesis. Left ventricular ejection fraction, by estimation, is 55%. The left ventricle has normal function. The left ventricle demonstrates regional wall motion abnormalities. The left ventricular internal cavity size was normal in size. There is mild left ventricular hypertrophy. Left ventricular diastolic parameters were normal.  Right Ventricle: The right ventricular size is normal. No increase in right ventricular wall thickness. Right ventricular systolic function is normal.  Left Atrium: Left atrial size was normal in size.  Right Atrium: Right atrial size was normal in size.  Pericardium: There is no evidence of pericardial effusion.  Mitral Valve: The mitral valve is abnormal. There is mild thickening of the mitral valve leaflet(s). Mild mitral valve regurgitation. No evidence of mitral valve stenosis.  Tricuspid  Valve: The tricuspid valve is normal in structure. Tricuspid valve regurgitation is mild . No evidence of tricuspid stenosis.  Aortic Valve: The aortic valve is normal in structure. Aortic valve regurgitation is not visualized. No aortic stenosis is present.  Pulmonic Valve: The pulmonic valve was normal in structure. Pulmonic valve regurgitation is not visualized. No evidence of pulmonic stenosis.  Aorta: The aortic root is normal in size and structure.  Venous: The inferior vena cava is normal in size with greater than 50% respiratory variability, suggesting right atrial pressure of 3 mmHg.  IAS/Shunts: No atrial level shunt detected by color flow Doppler.   LEFT VENTRICLE PLAX 2D LVIDd:         5.30 cm   Diastology LVIDs:         2.90 cm   LV e' medial:    6.64 cm/s LV PW:         1.30 cm   LV E/e' medial:  10.9 LV IVS:        1.30 cm   LV e' lateral:   9.79 cm/s LVOT diam:     2.30 cm   LV E/e' lateral: 7.4 LV SV:         98 LV SV Index:   43 LVOT Area:     4.15 cm   RIGHT VENTRICLE             IVC RV S prime:     12.10 cm/s  IVC diam: 1.30 cm TAPSE (M-mode): 2.0 cm  LEFT ATRIUM              Index        RIGHT ATRIUM           Index LA diam:        4.00  cm  1.76 cm/m   RA Pressure: 3.00 mmHg LA Vol (A2C):   116.0 ml 50.92 ml/m  RA Area:     17.70 cm LA Vol (A4C):   61.0 ml  26.77 ml/m  RA Volume:   46.70 ml  20.50 ml/m LA Biplane Vol: 84.0 ml  36.87 ml/m AORTIC VALVE LVOT Vmax:   99.00 cm/s LVOT Vmean:  61.200 cm/s LVOT VTI:    0.236 m  AORTA Ao Root diam: 3.20 cm Ao Asc diam:  3.20 cm  MITRAL VALVE               TRICUSPID VALVE MV Area (PHT): 3.01 cm    Estimated RAP:  3.00 mmHg MV Decel Time: 252 msec MV E velocity: 72.30 cm/s  SHUNTS MV A velocity: 60.80 cm/s  Systemic VTI:  0.24 m MV E/A ratio:  1.19        Systemic Diam: 2.30 cm  Charlton Haws MD Electronically signed by Charlton Haws MD Signature Date/Time: 10/28/2021/10:01:30 AM    Final                EKG:  No new tracing today  Recent Labs: 12/26/2021: BUN 28; Creatinine, Ser 1.56; Potassium 4.4; Sodium 141 05/15/2022: ALT 10; Hemoglobin 13.5; Platelets 235  Recent Lipid Panel    Component Value Date/Time   CHOL 140 02/03/2022 0848   TRIG 99 02/03/2022 0848   HDL 66 02/03/2022 0848   CHOLHDL 2.1 02/03/2022 0848   CHOLHDL 4.3 07/15/2014 0600   VLDL 23 07/15/2014 0600   LDLCALC 56 02/03/2022 0848   LDLDIRECT 100 (H) 02/10/2021 1112     Risk Assessment/Calculations:           Physical Exam:    VS:  BP (!) 140/80   Pulse 66   Ht 5\' 6"  (1.676 m)   Wt 292 lb 9.6 oz (132.7 kg)   SpO2 97%   BMI 47.23 kg/m     Wt Readings from Last 3 Encounters:  06/30/22 292 lb 9.6 oz (132.7 kg)  06/19/22 293 lb 6.4 oz (133.1 kg)  05/15/22 298 lb 9.6 oz (135.4 kg)     GEN:  Well nourished, well developed in no acute distress HEENT: Normal NECK: No JVD; No carotid bruits CARDIAC: RRR, 1/6 systolic murmur RESPIRATORY:  Scattered expiratory wheezing ABDOMEN: Obese, soft, NTTP MUSCULOSKELETAL:  No edema; No deformity  SKIN: Warm and dry NEUROLOGIC:  Alert and oriented x 3 PSYCHIATRIC:  Normal affect   ASSESSMENT:    1. Coronary artery disease involving native coronary artery of native heart without angina pectoris   2. Stage 3b chronic kidney disease (HCC)   3. Essential hypertension   4. PAD (peripheral artery disease) (HCC)   5. Enlarged pulmonary artery (HCC)   6. Morbid obesity (HCC)   7. Essential hypertension, benign   8. TOBACCO DEPENDENCE    PLAN:    In order of problems listed above:  #History of NSTEMI: #Mild-to-Moderate CAD: Cath in 2016 with mild-to-moderate disease and was managed medically. Myoview 10/2021 with no ischemia/infarction. Currently doing well with no chest pain. -Continue ASA 81mg  daily -Continue zetia 10mg  daily -Continue repatha 140mg  q 14 days -Continue lifestyle modifications  #Suspected Pulmonary HTN: PA artery enlarged at  3.5cm on recent CT with concern for pulmonary HTN. Likely related to COPD in the setting of tobacco abuse as well as obesity.  TTE with normal RV systolic function but PASP could not be estimated. Will continue to monitor. Encouraged tobacco  cessation.  #HTN: Elevated 140s at home. -Continue amlodipine 10mg  daily -Continue metop 100mg  BID -Increase hydralazine 75mg  BID  #HLD: -Continue zetia 10mg  daily -Continue repatha -LDL 56 in 01/2022  #PAD: Discussed aortogram results from 2016. Still with claudication symptoms and patient is now more willing to pursue further work-up. Will check arterial ABIs and refer to Dr. Kirke Corin. -Check arterial LE dopplers -Refer to Dr. Kirke Corin -Increase walking as tolerated -Continue zetia 10mg  daily -Continue repatha 140mg  q 14 days -Continue ASA and plavix  #Emphysema by CT: #Tobacco Abuse: -Encouraged cessation -Refer to Pulm rehab per patient request  #Morbid Obesity: BMI 47. Monitored very closely by PCP. Working on lifestyle modifications. Cannot afford GLP-1 agonist  Exercise recommendations: Goal of exercising for at least 30 minutes a day, at least 5 times per week.  Please exercise to a moderate exertion.  This means that while exercising it is difficult to speak in full sentences, however you are not so short of breath that you feel you must stop, and not so comfortable that you can carry on a full conversation.  Exertion level should be approximately a 5/10, if 10 is the most exertion you can perform.  Diet recommendations: Recommend a heart healthy diet such as the Mediterranean diet.  This diet consists of plant based foods, healthy fats, lean meats, olive oil.  It suggests limiting the intake of simple carbohydrates such as white breads, pastries, and pastas.  It also limits the amount of red meat, wine, and dairy products such as cheese that one should consume on a daily basis.     Cardiac Rehabilitation Eligibility Assessment  The  patient is ready to start cardiac rehabilitation from a cardiac standpoint.    Medication Adjustments/Labs and Tests Ordered: Current medicines are reviewed at length with the patient today.  Concerns regarding medicines are outlined above.  Orders Placed This Encounter  Procedures   AMB referral to pulmonary rehabilitation   Ambulatory Referral for Peripheral Vascular Consult   VAS [place] LOWER EXTREMITY ARTERIAL DUPLEX   VAS [place] ABI WITH/WO TBI   Meds ordered this encounter  Medications   DISCONTD: hydrALAZINE (APRESOLINE) 50 MG tablet    Sig: Take 1.5 tablets (75 mg total) by mouth in the morning and at bedtime.    Dispense:  180 tablet    Refill:  2   hydrALAZINE (APRESOLINE) 50 MG tablet    Sig: Take 1.5 tablets (75 mg total) by mouth 2 (two) times daily.    Dispense:  270 tablet    Refill:  1    Dose increased    Patient Instructions  Medication Instructions:  Hydrlazine 75 mg two times a day *If you need a refill on your cardiac medications before your next appointment, please call your pharmacy*   You have been referred to Dr. Jerolyn Center Vascular doctor at our [facility] office.   Testing/Procedures: Your physician has requested that you have a lower  extremity arterial duplex. This test is an ultrasound of the arteries in the legs or arms. It looks at arterial blood flow in the legs and arms. Allow one hour for Lower and Upper Arterial scans. There are no restrictions or special instructions   Your physician has requested that you have an ankle brachial index (ABI). During this test an ultrasound and blood pressure cuff are used to evaluate the arteries that supply the arms and legs with blood. Allow thirty minutes for this exam. There are no restrictions or special instructions.   Follow-Up:  At [facility], you and your health needs are our priority.  As part of our continuing mission to provide you with exceptional heart care, we have created designated  Provider Care Teams.  These Care Teams include your primary Cardiologist (physician) and Advanced Practice Providers (APPs -  Physician Assistants and Nurse Practitioners) who all work together to provide you with the care you need, when you need it.  We recommend signing up for the patient portal called "MyChart".  Sign up information is provided on this After Visit Summary.  MyChart is used to connect with patients for Virtual Visits (Telemedicine).  Patients are able to view lab/test results, encounter notes, upcoming appointments, etc.  Non-urgent messages can be sent to your provider as well.   To learn more about what you can do with MyChart, go to [email or web address].    Your next appointment:   6 month(s)  Provider:   Meriam Sprague, MD     Other Instructions NONE   Signed, Meriam Sprague, MD  06/30/2022 9:35 AM    [facility]

## 2022-06-19 ENCOUNTER — Ambulatory Visit (INDEPENDENT_AMBULATORY_CARE_PROVIDER_SITE_OTHER): Payer: 59 | Admitting: Family Medicine

## 2022-06-19 ENCOUNTER — Other Ambulatory Visit: Payer: Self-pay

## 2022-06-19 ENCOUNTER — Encounter: Payer: Self-pay | Admitting: Family Medicine

## 2022-06-19 VITALS — BP 149/95 | HR 59 | Ht 66.0 in | Wt 293.4 lb

## 2022-06-19 DIAGNOSIS — Z888 Allergy status to other drugs, medicaments and biological substances status: Secondary | ICD-10-CM | POA: Diagnosis not present

## 2022-06-19 DIAGNOSIS — I10 Essential (primary) hypertension: Secondary | ICD-10-CM | POA: Diagnosis not present

## 2022-06-19 DIAGNOSIS — R053 Chronic cough: Secondary | ICD-10-CM | POA: Diagnosis not present

## 2022-06-19 DIAGNOSIS — I739 Peripheral vascular disease, unspecified: Secondary | ICD-10-CM | POA: Diagnosis not present

## 2022-06-19 DIAGNOSIS — F17200 Nicotine dependence, unspecified, uncomplicated: Secondary | ICD-10-CM | POA: Diagnosis not present

## 2022-06-19 MED ORDER — CYCLOBENZAPRINE HCL 5 MG PO TABS: 5.0000 mg | ORAL_TABLET | Freq: Three times a day (TID) | ORAL | 1 refills | Status: DC | PRN

## 2022-06-19 NOTE — Assessment & Plan Note (Signed)
Semaglutide was sent in previously given CAD On ASA and plavix  Not on a statin due to swelling with this

## 2022-06-19 NOTE — Patient Instructions (Addendum)
It was wonderful to see you today.  Please bring ALL of your medications with you to every visit.   Today we talked about:   Going to get an x-ray You can go any day from 9 to 5 PM An x-ray was ordered for you---you do not need an appointment to have this completed.  I recommend going to [facility] [address] [place]  If the results are normal,I will send you a letter  I will call you with results if anything is abnormal   Please call me in 2 weeks with your blood pressure readings   I will call you with blood work    Please follow up in 2 months   Thank you for choosing [facility].   Please call [phone] with any questions about today's appointment.  Please be sure to schedule follow up at the front  desk before you leave today.   [name], MD  Family Medicine

## 2022-06-19 NOTE — Assessment & Plan Note (Signed)
Has Omron cuff she brought with her today Reports readings at home lower than here Validated patients cuff today Will send in home readings--likely need to increase hydralazine

## 2022-06-20 LAB — INSULIN, RANDOM: INSULIN: 27.5 u[IU]/mL — ABNORMAL HIGH (ref 2.6–24.9)

## 2022-06-22 ENCOUNTER — Telehealth: Payer: Self-pay

## 2022-06-22 NOTE — Telephone Encounter (Signed)
Prior Auth for patients medication OZEMPIC denied by [facility] MEDICARE via CoverMyMeds.   Reason:   CoverMyMeds Key: [id number]

## 2022-06-22 NOTE — Telephone Encounter (Signed)
A Prior Authorization was initiated for this patients OZEMPIC through CoverMyMeds.   Key: [id number]

## 2022-06-23 ENCOUNTER — Telehealth: Payer: Self-pay | Admitting: Family Medicine

## 2022-06-23 DIAGNOSIS — E88819 Insulin resistance, unspecified: Secondary | ICD-10-CM

## 2022-06-23 MED ORDER — SEMAGLUTIDE(0.25 OR 0.5MG/DOS) 2 MG/1.5ML ~~LOC~~ SOPN: 0.2500 mg | PEN_INJECTOR | SUBCUTANEOUS | 3 refills | Status: DC

## 2022-06-23 NOTE — Telephone Encounter (Signed)
Labs returned. Show insulin resistance. Rx for semaglutide given prediabetes and elevated insulin.  Left generic voicemail. If patient calls back please let her know above.   [name], MD  Family Medicine Teaching Service

## 2022-06-25 ENCOUNTER — Ambulatory Visit
Admission: RE | Admit: 2022-06-25 | Discharge: 2022-06-25 | Disposition: A | Discharge status: Home / Self Care | Payer: Medicaid Other | Source: Ambulatory Visit | Attending: Family Medicine | Admitting: Family Medicine

## 2022-06-25 DIAGNOSIS — R053 Chronic cough: Secondary | ICD-10-CM

## 2022-06-30 ENCOUNTER — Encounter: Payer: Self-pay | Admitting: Cardiology

## 2022-06-30 ENCOUNTER — Ambulatory Visit: Payer: 59 | Attending: Cardiology | Admitting: Cardiology

## 2022-06-30 ENCOUNTER — Telehealth: Payer: Self-pay | Admitting: Family Medicine

## 2022-06-30 VITALS — BP 140/80 | HR 66 | Ht 66.0 in | Wt 292.6 lb

## 2022-06-30 DIAGNOSIS — N1832 Chronic kidney disease, stage 3b: Secondary | ICD-10-CM | POA: Diagnosis not present

## 2022-06-30 DIAGNOSIS — I288 Other diseases of pulmonary vessels: Secondary | ICD-10-CM | POA: Diagnosis not present

## 2022-06-30 DIAGNOSIS — I739 Peripheral vascular disease, unspecified: Secondary | ICD-10-CM

## 2022-06-30 DIAGNOSIS — I10 Essential (primary) hypertension: Secondary | ICD-10-CM | POA: Diagnosis not present

## 2022-06-30 DIAGNOSIS — F17200 Nicotine dependence, unspecified, uncomplicated: Secondary | ICD-10-CM | POA: Diagnosis not present

## 2022-06-30 DIAGNOSIS — I2510 Atherosclerotic heart disease of native coronary artery without angina pectoris: Secondary | ICD-10-CM | POA: Diagnosis not present

## 2022-06-30 DIAGNOSIS — R0989 Other specified symptoms and signs involving the circulatory and respiratory systems: Secondary | ICD-10-CM

## 2022-06-30 MED ORDER — HYDRALAZINE HCL 50 MG PO TABS
75.0000 mg | ORAL_TABLET | Freq: Two times a day (BID) | ORAL | 1 refills | Status: DC
Start: 2022-06-30 — End: 2022-07-22

## 2022-06-30 MED ORDER — HYDRALAZINE HCL 50 MG PO TABS
75.0000 mg | ORAL_TABLET | Freq: Two times a day (BID) | ORAL | 2 refills | Status: DC
Start: 2022-06-30 — End: 2022-06-30

## 2022-06-30 NOTE — Telephone Encounter (Signed)
Called patient with X-ray. Discussed at length. Given smoking and abnormal lung sounds, will obtain CT. Discussed reasoning, location.  Nursing please schedule CT.   All questions answered.   [name], MD  Family Medicine Teaching Service

## 2022-06-30 NOTE — Patient Instructions (Signed)
Medication Instructions:  Hydrlazine 75 mg two times a day *If you need a refill on your cardiac medications before your next appointment, please call your pharmacy*   You have been referred to Dr. Jerolyn Center Vascular doctor at our [facility] office.   Testing/Procedures: Your physician has requested that you have a lower  extremity arterial duplex. This test is an ultrasound of the arteries in the legs or arms. It looks at arterial blood flow in the legs and arms. Allow one hour for Lower and Upper Arterial scans. There are no restrictions or special instructions   Your physician has requested that you have an ankle brachial index (ABI). During this test an ultrasound and blood pressure cuff are used to evaluate the arteries that supply the arms and legs with blood. Allow thirty minutes for this exam. There are no restrictions or special instructions.   Follow-Up:  At [facility], you and your health needs are our priority.  As part of our continuing mission to provide you with exceptional heart care, we have created designated Provider Care Teams.  These Care Teams include your primary Cardiologist (physician) and Advanced Practice Providers (APPs -  Physician Assistants and Nurse Practitioners) who all work together to provide you with the care you need, when you need it.  We recommend signing up for the patient portal called "MyChart".  Sign up information is provided on this After Visit Summary.  MyChart is used to connect with patients for Virtual Visits (Telemedicine).  Patients are able to view lab/test results, encounter notes, upcoming appointments, etc.  Non-urgent messages can be sent to your provider as well.   To learn more about what you can do with MyChart, go to [email or web address].    Your next appointment:   6 month(s)  Provider:   Meriam Sprague, MD     Other Instructions NONE

## 2022-07-02 ENCOUNTER — Telehealth: Payer: Self-pay | Admitting: Family Medicine

## 2022-07-02 ENCOUNTER — Telehealth: Payer: Self-pay | Admitting: Cardiology

## 2022-07-02 NOTE — Telephone Encounter (Signed)
Spoke with the pt and advised her that she can have [facility] split the pills for her, being she is having a hard time splitting the hydralazine herself, and when she finishes that supply, she can call me back and I can send in the 25 mg tablets of hydralazine vs the 50 mg tabs, so that she can get the equivalent of 75 mg po BID, thereafter.  Pt verbalized understanding and agrees with this plan.  Pt was more than gracious for all the assistance provided.

## 2022-07-02 NOTE — Telephone Encounter (Signed)
Called patient to schedule Medicare Annual Wellness Visit (AWV). Left message for patient to call back and schedule Medicare Annual Wellness Visit (AWV).  Last date of AWV: AWVI eligible as of 07/01/2022  Please schedule an AWVI appointment at any time with [phone].  If any questions, please contact me at [phone].    Thank you,  [facility] Support [facility] Direct dial  [phone]

## 2022-07-02 NOTE — Telephone Encounter (Signed)
Pt c/o medication issue:  1. Name of Medication: hydrALAZINE (APRESOLINE) 50 MG tablet   2. How are you currently taking this medication (dosage and times per day)?    3. Are you having a reaction (difficulty breathing--STAT)? no  4. What is your medication issue? Patient states she is having a hard time cutting the pill in half. Unsure of what to do, pills is smaller then what she was getting before. Please advise

## 2022-07-06 ENCOUNTER — Other Ambulatory Visit: Payer: Self-pay

## 2022-07-06 DIAGNOSIS — I10 Essential (primary) hypertension: Secondary | ICD-10-CM

## 2022-07-06 DIAGNOSIS — N1832 Chronic kidney disease, stage 3b: Secondary | ICD-10-CM

## 2022-07-06 MED ORDER — METOPROLOL TARTRATE 100 MG PO TABS: 100.0000 mg | ORAL_TABLET | Freq: Two times a day (BID) | ORAL | 2 refills | Status: DC

## 2022-07-07 ENCOUNTER — Other Ambulatory Visit: Payer: Self-pay | Admitting: Pharmacist

## 2022-07-07 MED ORDER — REPATHA SURECLICK 140 MG/ML ~~LOC~~ SOAJ: 140.0000 mg | SUBCUTANEOUS | 11 refills | Status: DC

## 2022-07-08 ENCOUNTER — Telehealth: Payer: Self-pay | Admitting: Family Medicine

## 2022-07-08 NOTE — Telephone Encounter (Signed)
Contacted Orvilla Fus to schedule their annual wellness visit. Appointment made for 07/10/2022.  Thank you,  [facility] Support [facility] Direct dial  [phone]

## 2022-07-09 ENCOUNTER — Other Ambulatory Visit: Payer: Self-pay | Admitting: *Deleted

## 2022-07-09 DIAGNOSIS — I739 Peripheral vascular disease, unspecified: Secondary | ICD-10-CM

## 2022-07-09 DIAGNOSIS — I2510 Atherosclerotic heart disease of native coronary artery without angina pectoris: Secondary | ICD-10-CM

## 2022-07-09 MED ORDER — AMLODIPINE BESYLATE 10 MG PO TABS: 10.0000 mg | ORAL_TABLET | Freq: Every day | ORAL | 3 refills | Status: DC

## 2022-07-09 MED ORDER — CLOPIDOGREL BISULFATE 75 MG PO TABS: 75.0000 mg | ORAL_TABLET | Freq: Every day | ORAL | 3 refills | Status: DC

## 2022-07-09 MED ORDER — EZETIMIBE 10 MG PO TABS: 10.0000 mg | ORAL_TABLET | Freq: Every evening | ORAL | 3 refills | Status: DC

## 2022-07-10 ENCOUNTER — Ambulatory Visit (INDEPENDENT_AMBULATORY_CARE_PROVIDER_SITE_OTHER): Payer: 59

## 2022-07-10 VITALS — Ht 66.0 in | Wt 292.0 lb

## 2022-07-10 DIAGNOSIS — Z0000 Encounter for general adult medical examination without abnormal findings: Secondary | ICD-10-CM

## 2022-07-10 NOTE — Patient Instructions (Signed)
Tammy [name] , Thank you for taking time to come for your Medicare Wellness Visit. I appreciate your ongoing commitment to your health goals. Please review the following plan we discussed and let me know if I can assist you in the future.   These are the goals we discussed:  Goals      Quit Smoking        This is a list of the screening recommended for you and due dates:  Health Maintenance  Topic Date Due   Medicare Annual Wellness Visit  Never done   DTaP/Tdap/Td vaccine (1 - Tdap) Never done   COVID-19 Vaccine (6 - 2023-24 season) 10/31/2021   DEXA scan (bone density measurement)  11/08/2022*   Flu Shot  10/01/2022   Pap Smear  02/02/2023   Mammogram  09/11/2023   Colon Cancer Screening  08/24/2029   Pneumonia Vaccine  Completed   Hepatitis C Screening: USPSTF Recommendation to screen - Ages 18-79 yo.  Completed   HIV Screening  Completed   Zoster (Shingles) Vaccine  Completed   HPV Vaccine  Aged Out  *Topic was postponed. The date shown is not the original due date.    Advanced directives: ***  Conditions/risks identified: Aim for 30 minutes of exercise or brisk walking, 6-8 glasses of water, and 5 servings of fruits and vegetables each day.   Next appointment: Follow up in one year for your annual wellness visit    Preventive Care 65 Years and Older, Female Preventive care refers to lifestyle choices and visits with your health care provider that can promote health and wellness. What does preventive care include? A yearly physical exam. This is also called an annual well check. Dental exams once or twice a year. Routine eye exams. Ask your health care provider how often you should have your eyes checked. Personal lifestyle choices, including: Daily care of your teeth and gums. Regular physical activity. Eating a healthy diet. Avoiding tobacco and drug use. Limiting alcohol use. Practicing safe sex. Taking low-dose aspirin every day. Taking vitamin and mineral  supplements as recommended by your health care provider. What happens during an annual well check? The services and screenings done by your health care provider during your annual well check will depend on your age, overall health, lifestyle risk factors, and family history of disease. Counseling  Your health care provider may ask you questions about your: Alcohol use. Tobacco use. Drug use. Emotional well-being. Home and relationship well-being. Sexual activity. Eating habits. History of falls. Memory and ability to understand (cognition). Work and work Astronomer. Reproductive health. Screening  You may have the following tests or measurements: Height, weight, and BMI. Blood pressure. Lipid and cholesterol levels. These may be checked every 5 years, or more frequently if you are over 70 years old. Skin check. Lung cancer screening. You may have this screening every year starting at age 33 if you have a 30-pack-year history of smoking and currently smoke or have quit within the past 15 years. Fecal occult blood test (FOBT) of the stool. You may have this test every year starting at age 70. Flexible sigmoidoscopy or colonoscopy. You may have a sigmoidoscopy every 5 years or a colonoscopy every 10 years starting at age 73. Hepatitis C blood test. Hepatitis B blood test. Sexually transmitted disease (STD) testing. Diabetes screening. This is done by checking your blood sugar (glucose) after you have not eaten for a while (fasting). You may have this done every 1-3 years. Bone density scan. This  is done to screen for osteoporosis. You may have this done starting at age 92. Mammogram. This may be done every 1-2 years. Talk to your health care provider about how often you should have regular mammograms. Talk with your health care provider about your test results, treatment options, and if necessary, the need for more tests. Vaccines  Your health care provider may recommend certain  vaccines, such as: Influenza vaccine. This is recommended every year. Tetanus, diphtheria, and acellular pertussis (Tdap, Td) vaccine. You may need a Td booster every 10 years. Zoster vaccine. You may need this after age 75. Pneumococcal 13-valent conjugate (PCV13) vaccine. One dose is recommended after age 51. Pneumococcal polysaccharide (PPSV23) vaccine. One dose is recommended after age 5. Talk to your health care provider about which screenings and vaccines you need and how often you need them. This information is not intended to replace advice given to you by your health care provider. Make sure you discuss any questions you have with your health care provider. Document Released: 03/15/2015 Document Revised: 11/06/2015 Document Reviewed: 12/18/2014 Elsevier Interactive Patient Education  2017 [place] Prevention in the Home Falls can cause injuries. They can happen to people of all ages. There are many things you can do to make your home safe and to help prevent falls. What can I do on the outside of my home? Regularly fix the edges of walkways and driveways and fix any cracks. Remove anything that might make you trip as you walk through a door, such as a raised step or threshold. Trim any bushes or trees on the path to your home. Use bright outdoor lighting. Clear any walking paths of anything that might make someone trip, such as rocks or tools. Regularly check to see if handrails are loose or broken. Make sure that both sides of any steps have handrails. Any raised decks and porches should have guardrails on the edges. Have any leaves, snow, or ice cleared regularly. Use sand or salt on walking paths during winter. Clean up any spills in your garage right away. This includes oil or grease spills. What can I do in the bathroom? Use night lights. Install grab bars by the toilet and in the tub and shower. Do not use towel bars as grab bars. Use non-skid mats or decals in  the tub or shower. If you need to sit down in the shower, use a plastic, non-slip stool. Keep the floor dry. Clean up any water that spills on the floor as soon as it happens. Remove soap buildup in the tub or shower regularly. Attach bath mats securely with double-sided non-slip rug tape. Do not have throw rugs and other things on the floor that can make you trip. What can I do in the bedroom? Use night lights. Make sure that you have a light by your bed that is easy to reach. Do not use any sheets or blankets that are too big for your bed. They should not hang down onto the floor. Have a firm chair that has side arms. You can use this for support while you get dressed. Do not have throw rugs and other things on the floor that can make you trip. What can I do in the kitchen? Clean up any spills right away. Avoid walking on wet floors. Keep items that you use a lot in easy-to-reach places. If you need to reach something above you, use a strong step stool that has a grab bar. Keep electrical cords out  of the way. Do not use floor polish or wax that makes floors slippery. If you must use wax, use non-skid floor wax. Do not have throw rugs and other things on the floor that can make you trip. What can I do with my stairs? Do not leave any items on the stairs. Make sure that there are handrails on both sides of the stairs and use them. Fix handrails that are broken or loose. Make sure that handrails are as long as the stairways. Check any carpeting to make sure that it is firmly attached to the stairs. Fix any carpet that is loose or worn. Avoid having throw rugs at the top or bottom of the stairs. If you do have throw rugs, attach them to the floor with carpet tape. Make sure that you have a light switch at the top of the stairs and the bottom of the stairs. If you do not have them, ask someone to add them for you. What else can I do to help prevent falls? Wear shoes that: Do not have high  heels. Have rubber bottoms. Are comfortable and fit you well. Are closed at the toe. Do not wear sandals. If you use a stepladder: Make sure that it is fully opened. Do not climb a closed stepladder. Make sure that both sides of the stepladder are locked into place. Ask someone to hold it for you, if possible. Clearly mark and make sure that you can see: Any grab bars or handrails. First and last steps. Where the edge of each step is. Use tools that help you move around (mobility aids) if they are needed. These include: Canes. Walkers. Scooters. Crutches. Turn on the lights when you go into a dark area. Replace any light bulbs as soon as they burn out. Set up your furniture so you have a clear path. Avoid moving your furniture around. If any of your floors are uneven, fix them. If there are any pets around you, be aware of where they are. Review your medicines with your doctor. Some medicines can make you feel dizzy. This can increase your chance of falling. Ask your doctor what other things that you can do to help prevent falls. This information is not intended to replace advice given to you by your health care provider. Make sure you discuss any questions you have with your health care provider. Document Released: 12/13/2008 Document Revised: 07/25/2015 Document Reviewed: 03/23/2014 Elsevier Interactive Patient Education  2017 [employer].

## 2022-07-11 NOTE — Progress Notes (Cosign Needed)
Subjective:   Tammy [name] is a 67 y.o. female who presents for an Initial Medicare Annual Wellness Visit.  I connected with  Tammy [name] on 0510/24 by a audio enabled telemedicine application and verified that I am speaking with the correct person using two identifiers.  Patient Location: Home  Provider Location: Home Office  I discussed the limitations of evaluation and management by telemedicine. The patient expressed understanding and agreed to proceed.  Review of Systems     Cardiac Risk Factors include: dyslipidemia;hypertension;advanced age (>50men, >54 women)     Objective:    Today's Vitals   07/11/22 2021  Weight: 292 lb (132.5 kg)  Height: 5\' 6"  (1.676 m)   Body mass index is 47.13 kg/m.     07/11/2022    8:45 PM 06/19/2022   11:10 AM 05/15/2022    9:53 AM 02/06/2022    8:50 AM 12/26/2021    9:27 AM 06/20/2021   10:24 AM 03/21/2021    9:43 AM  Advanced Directives  Does Patient Have a Medical Advance Directive? No No No No No No No  Would patient like information on creating a medical advance directive? Yes (MAU/Ambulatory/Procedural Areas - Information given) No - Patient declined No - Patient declined No - Patient declined No - Patient declined  No - Patient declined    Current Medications (verified) Outpatient Encounter Medications as of 07/10/2022  Medication Sig   amLODipine (NORVASC) 10 MG tablet Take 1 tablet (10 mg total) by mouth daily.   aspirin EC 81 MG tablet Take 1 tablet (81 mg total) by mouth daily.   clindamycin (CLEOCIN-T) 1 % external solution Apply topically 2 (two) times daily. Apply to lesion on mons pubis twice daily for 1 week   clopidogrel (PLAVIX) 75 MG tablet Take 1 tablet (75 mg total) by mouth daily.   cyclobenzaprine (FLEXERIL) 5 MG tablet Take 1 tablet (5 mg total) by mouth 3 (three) times daily as needed.   Evolocumab (REPATHA SURECLICK) 140 MG/ML SOAJ Inject 140 mg into the skin every 14 (fourteen) days.   ezetimibe  (ZETIA) 10 MG tablet Take 1 tablet (10 mg total) by mouth at bedtime.   furosemide (LASIX) 20 MG tablet MWF   hydrALAZINE (APRESOLINE) 50 MG tablet Take 1.5 tablets (75 mg total) by mouth 2 (two) times daily.   Insulin Pen Needle 31G X 8 MM MISC Use with Wegovy   ketoconazole (NIZORAL) 2 % cream Apply 1 application. topically daily.   metoprolol tartrate (LOPRESSOR) 100 MG tablet Take 1 tablet (100 mg total) by mouth 2 (two) times daily.   nicotine polacrilex (NICORETTE) 2 MG gum Take 1 each (2 mg total) by mouth as needed for smoking cessation.   nitroGLYCERIN (NITROSTAT) 0.4 MG SL tablet Place 1 tablet (0.4 mg total) under the tongue every 5 (five) minutes as needed for chest pain.   Semaglutide,0.25 or 0.5MG /DOS, 2 MG/1.5ML SOPN Inject 0.25 mg into the skin once a week. 0.25 mg once weekly for 4 weeks then increase to 0.5 mg weekly for at least 4 weeks,max 1 mg   sodium bicarbonate 650 MG tablet Take 650 mg by mouth 2 (two) times daily.   Vitamin D, Ergocalciferol, (DRISDOL) 1.25 MG (50000 UNIT) CAPS capsule Take 50,000 Units by mouth once a week.   Facility-Administered Encounter Medications as of 07/10/2022  Medication   acetaminophen (TYLENOL) tablet 975 mg    Allergies (verified) Ace inhibitors, Hydrochlorothiazide, Losartan, and Rosuvastatin   History: Past Medical History:  Diagnosis Date   Abnormal mammogram 10.25.11   area of density with several adjacent amorphous calcifications located laterally w/in the right breast at approx 9-10 o'clock position likely represents area of evolving far necrosis; f/u mamo and U/S in 6 months   CAD (coronary artery disease)    a. 07/2014 NSTEMI/Cath: LM nl, LAD 30ost/m, 60d, LCX 20p, OM1 20, OM2 40, OM3 50, RCA 30p, 32m, 20d, RPDA 40, EF 65%-->Med Rx;  b. 07/2014 Echo: EF 55-60%, no rwma, mildly dil LA.   Chronic kidney disease    Essential hypertension    GERD (gastroesophageal reflux disease)    Hyperlipidemia    Morbid obesity (HCC)    PAD  (peripheral artery disease) (HCC)    a. 07/2014 ABI's: R 0.88, L 0.32.   Ulcer    Past Surgical History:  Procedure Laterality Date   BREAST BIOPSY Left 09/01/2018   BREAST BIOPSY Left 10/07/2021   CARDIAC CATHETERIZATION N/A 07/16/2014   Procedure: Left Heart Cath and Coronary Angiography;  Surgeon: Kathleene Hazel, MD;  Location: [facility] INVASIVE CV LAB;  Service: Cardiovascular;  Laterality: N/A;   CESAREAN SECTION     PERIPHERAL VASCULAR CATHETERIZATION N/A 02/12/2015   Procedure: Abdominal Aortogram;  Surgeon: Nada Libman, MD;  Location: [facility] INVASIVE CV LAB;  Service: Cardiovascular;  Laterality: N/A;   TRIGGER FINGER RELEASE Left    middle finger   UPPER GASTROINTESTINAL ENDOSCOPY  1991   Family History  Problem Relation Age of Onset   Cancer Mother        Bladder cancer, cause of death   Diabetes Mother    Diabetes Father    Hypertension Father    Hypertension Sister    Hypertension Brother    Cancer Maternal Aunt    Breast cancer Maternal Aunt        unsure of age   Ovarian cancer Maternal Aunt    Social History   Socioeconomic History   Marital status: Single    Spouse name: Not on file   Number of children: Not on file   Years of education: Not on file   Highest education level: Not on file  Occupational History   Not on file  Tobacco Use   Smoking status: Every Day    Packs/day: 0.25    Years: 30.00    Additional pack years: 0.00    Total pack years: 7.50    Types: Cigarettes   Smokeless tobacco: Never   Tobacco comments:    8-10 cigarettes per day or more  Substance and Sexual Activity   Alcohol use: No    Alcohol/week: 0.0 standard drinks of alcohol   Drug use: No   Sexual activity: Not Currently    Birth control/protection: Post-menopausal, Abstinence  Other Topics Concern   Not on file  Social History Narrative   Not on file   Social Determinants of Health   Financial Resource Strain: Low Risk  (07/11/2022)   Overall Financial  Resource Strain (CARDIA)    Difficulty of Paying Living Expenses: Not hard at all  Food Insecurity: No Food Insecurity (07/11/2022)   Hunger Vital Sign    Worried About Running Out of Food in the Last Year: Never true    Ran Out of Food in the Last Year: Never true  Transportation Needs: No Transportation Needs (07/11/2022)   PRAPARE - Administrator, Civil Service (Medical): No    Lack of Transportation (Non-Medical): No  Physical Activity: Insufficiently Active (07/11/2022)  Exercise Vital Sign    Days of Exercise per Week: 3 days    Minutes of Exercise per Session: 30 min  Stress: No Stress Concern Present (07/11/2022)   [employer] of Occupational Health - Occupational Stress Questionnaire    Feeling of Stress : Not at all  Social Connections: Unknown (07/11/2022)   Social Connection and Isolation Panel [NHANES]    Frequency of Communication with Friends and Family: More than three times a week    Frequency of Social Gatherings with Friends and Family: Three times a week    Attends Religious Services: More than 4 times per year    Active Member of Clubs or Organizations: No    Attends Banker Meetings: Never    Marital Status: Patient declined    Tobacco Counseling Ready to quit: Not Answered Counseling given: Not Answered Tobacco comments: 8-10 cigarettes per day or more   Clinical Intake:  Pre-visit preparation completed: Yes  Pain : No/denies pain Diabetes: No  How often do you need to have someone help you when you read instructions, pamphlets, or other written materials from your doctor or pharmacy?: 1 - Never  Diabetic?No   Interpreter Needed?: No  Information entered by :: Kandis Fantasia LPN   Activities of Daily Living    07/11/2022    8:40 PM  In your present state of health, do you have any difficulty performing the following activities:  Hearing? 0  Vision? 0  Difficulty concentrating or making decisions? 0  Walking or  climbing stairs? 0  Dressing or bathing? 0  Doing errands, shopping? 0  Preparing Food and eating ? N  Using the Toilet? N  In the past six months, have you accidently leaked urine? N  Do you have problems with loss of bowel control? N  Managing your Medications? N  Managing your Finances? N  Housekeeping or managing your Housekeeping? N    Patient Care Team: Westley Chandler, MD as PCP - General (Family Medicine) Meriam Sprague, MD as PCP - Cardiology (Cardiology) [facility], [facility]  Indicate any recent Medical Services you may have received from other than [facility] providers in the past year (date may be approximate).     Assessment:   This is a routine wellness examination for [place].  Hearing/Vision screen Hearing Screening - Comments:: Denies hearing difficulties   Vision Screening - Comments:: No vision problems; will schedule routine eye exam soon    Dietary issues and exercise activities discussed: Current Exercise Habits: Home exercise routine, Type of exercise: walking, Time (Minutes): 30, Frequency (Times/Week): 3, Weekly Exercise (Minutes/Week): 90, Intensity: Mild   Goals Addressed             This Visit's Progress    Quit Smoking   Not on track     Depression Screen    06/19/2022   11:12 AM 05/15/2022    9:53 AM 02/06/2022    8:50 AM 12/26/2021    9:27 AM 03/21/2021    9:42 AM 02/10/2021   10:21 AM 12/29/2019   11:03 AM  PHQ 2/9 Scores  PHQ - 2 Score 0 0 0 0 0 0 0  PHQ- 9 Score 0 0 0 0 0 0 0    Fall Risk    07/11/2022    8:40 PM 06/19/2022   11:09 AM 05/15/2022    9:53 AM 02/06/2022    8:50 AM 12/26/2021    9:27 AM  Fall Risk   Falls in the past  year? 0 0 0 0 0  Number falls in past yr: 0 0 0 0 0  Injury with Fall? 0 0 0 0 0  Risk for fall due to : No Fall Risks      Follow up Falls prevention discussed;Education provided;Falls evaluation completed        FALL RISK PREVENTION PERTAINING TO THE HOME:  Any stairs in or around  the home? No  If so, are there any without handrails? No  Home free of loose throw rugs in walkways, pet beds, electrical cords, etc? Yes  Adequate lighting in your home to reduce risk of falls? Yes   ASSISTIVE DEVICES UTILIZED TO PREVENT FALLS:  Life alert? No  Use of a cane, walker or w/c? No  Grab bars in the bathroom? Yes  Shower chair or bench in shower? No  Elevated toilet seat or a handicapped toilet? Yes   TIMED UP AND GO:  Was the test performed? No . Telephonic visit   Cognitive Function:        07/11/2022    8:45 PM  6CIT Screen  What Year? 0 points  What month? 0 points  What time? 0 points  Count back from 20 0 points  Months in reverse 0 points  Repeat phrase 0 points  Total Score 0 points    Immunizations Immunization History  Administered Date(s) Administered   Fluad Quad(high Dose 65+) 12/26/2021   Influenza Split 03/11/2012   Influenza,inj,Quad PF,6+ Mos 11/28/2012, 06/01/2014, 04/29/2015, 01/13/2017, 12/22/2017, 12/05/2018, 11/10/2019, 11/11/2020   PFIZER Comirnaty(Gray Top)Covid-19 Tri-Sucrose Vaccine 04/08/2020, 09/23/2020   PFIZER(Purple Top)SARS-COV-2 Vaccination 08/02/2019, 08/23/2019   PNEUMOCOCCAL CONJUGATE-20 12/26/2021   PPD Test 11/23/2011, 11/28/2012, 11/03/2016   Pfizer Covid-19 Vaccine Bivalent Booster 21yrs & up 01/06/2021   Zoster Recombinat (Shingrix) 10/29/2020, 02/25/2021    TDAP status: Up to date  Flu Vaccine status: Up to date  Pneumococcal vaccine status: Up to date  Covid-19 vaccine status: Information provided on how to obtain vaccines.   Qualifies for Shingles Vaccine? Yes   Zostavax completed No   Shingrix Completed?: Yes  Screening Tests Health Maintenance  Topic Date Due   DTaP/Tdap/Td (1 - Tdap) Never done   COVID-19 Vaccine (6 - 2023-24 season) 10/31/2021   DEXA SCAN  11/08/2022 (Originally 07/10/2021)   INFLUENZA VACCINE  10/01/2022   Medicare Annual Wellness (AWV)  07/10/2023   MAMMOGRAM  09/11/2023    COLONOSCOPY (Pts 45-78yrs Insurance coverage will need to be confirmed)  08/24/2029   Pneumonia Vaccine 55+ Years old  Completed   Hepatitis C Screening  Completed   Zoster Vaccines- Shingrix  Completed   HPV VACCINES  Aged Out    Health Maintenance  Health Maintenance Due  Topic Date Due   DTaP/Tdap/Td (1 - Tdap) Never done   COVID-19 Vaccine (6 - 2023-24 season) 10/31/2021    Colorectal cancer screening: Type of screening: Colonoscopy. Completed 08/25/19. Repeat every 10 years  Mammogram status: Completed 09/10/21. Repeat every year  Bone Density status:  Declines at this time   Lung Cancer Screening: (Low Dose CT Chest recommended if Age 54-80 years, 30 pack-year currently smoking OR have quit w/in 15years.) does not qualify.   Lung Cancer Screening Referral: n/a  Additional Screening:  Hepatitis C Screening: does qualify; Completed 06/26/19  Vision Screening: Recommended annual ophthalmology exams for early detection of glaucoma and other disorders of the eye. Is the patient up to date with their annual eye exam?  No  Who is the provider or  what is the name of the office in which the patient attends annual eye exams? none If pt is not established with a provider, would they like to be referred to a provider to establish care? No .   Dental Screening: Recommended annual dental exams for proper oral hygiene  Community Resource Referral / Chronic Care Management: CRR required this visit?  No   CCM required this visit?  No      Plan:     I have personally reviewed and noted the following in the patient's chart:   Medical and social history Use of alcohol, tobacco or illicit drugs  Current medications and supplements including opioid prescriptions. Patient is not currently taking opioid prescriptions. Functional ability and status Nutritional status Physical activity Advanced directives List of other physicians Hospitalizations, surgeries, and ER visits in previous  12 months Vitals Screenings to include cognitive, depression, and falls Referrals and appointments  In addition, I have reviewed and discussed with patient certain preventive protocols, quality metrics, and best practice recommendations. A written personalized care plan for preventive services as well as general preventive health recommendations were provided to patient.     Durwin Nora, [place]   1/61/0960   Due to this being a virtual visit, the after visit summary with patients personalized plan was offered to patient via mail or my-chart.  per request, patient was mailed a copy of AVS  Nurse Notes: No concerns

## 2022-07-13 ENCOUNTER — Other Ambulatory Visit (HOSPITAL_COMMUNITY): Payer: Self-pay

## 2022-07-21 ENCOUNTER — Ambulatory Visit (HOSPITAL_COMMUNITY)
Admission: RE | Admit: 2022-07-21 | Discharge: 2022-07-21 | Disposition: A | Discharge status: Home / Self Care | Payer: 59 | Source: Ambulatory Visit | Attending: Cardiovascular Disease | Admitting: Cardiovascular Disease

## 2022-07-21 DIAGNOSIS — F17200 Nicotine dependence, unspecified, uncomplicated: Secondary | ICD-10-CM | POA: Diagnosis not present

## 2022-07-21 DIAGNOSIS — I2510 Atherosclerotic heart disease of native coronary artery without angina pectoris: Secondary | ICD-10-CM | POA: Insufficient documentation

## 2022-07-21 DIAGNOSIS — I288 Other diseases of pulmonary vessels: Secondary | ICD-10-CM | POA: Diagnosis not present

## 2022-07-21 DIAGNOSIS — I739 Peripheral vascular disease, unspecified: Secondary | ICD-10-CM | POA: Diagnosis not present

## 2022-07-21 DIAGNOSIS — N1832 Chronic kidney disease, stage 3b: Secondary | ICD-10-CM | POA: Insufficient documentation

## 2022-07-21 DIAGNOSIS — I10 Essential (primary) hypertension: Secondary | ICD-10-CM | POA: Insufficient documentation

## 2022-07-21 LAB — VAS US ABI WITH/WO TBI
Left ABI: 0.44
Right ABI: 0.44

## 2022-07-22 ENCOUNTER — Telehealth: Payer: Self-pay | Admitting: *Deleted

## 2022-07-22 MED ORDER — HYDRALAZINE HCL 25 MG PO TABS: 75.0000 mg | ORAL_TABLET | Freq: Two times a day (BID) | ORAL | 4 refills | Status: DC

## 2022-07-22 NOTE — Telephone Encounter (Signed)
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 She has follow-up with Dr. Kirke Corin on 08/04/22 to manage this further. Continue her ASA, repatha and zetia

## 2022-07-22 NOTE — Telephone Encounter (Signed)
The patient has been notified of the result and verbalized understanding.  All questions (if any) were answered.  Pt aware to follow-up with Dr. Kirke Corin for PV follow-up, as scheduled for 6/4. Pt agreed to this plan.    Off note: Pt asked for a refill of her hydralazine but to send in the 25 mg tablets instead, to get 75 mg po BID, for the 50 mg tablets are too small for her to cut in 1/2 to equal this dose.  Will send in hydralazine 25 mg tablets to the pts confirmed pharmacy of choice.  Pt verbalized understanding and agrees with this plan.

## 2022-07-28 ENCOUNTER — Telehealth: Payer: Self-pay | Admitting: Cardiology

## 2022-07-28 MED ORDER — HYDRALAZINE HCL 50 MG PO TABS: 50.0000 mg | ORAL_TABLET | Freq: Two times a day (BID) | ORAL | 1 refills | Status: DC

## 2022-07-28 NOTE — Telephone Encounter (Signed)
Nelva, Hoar A - 07/28/2022 10:44 AM Meriam Sprague, MD  Sent: Tue Jul 28, 2022 11:18 AM  To: Loa Socks, LPN         Message  Is she willing to take anything else? If so, we can trial spironolactone 25mg  daily and repeat BMET in 1 week. If not, we can follow-up with her in clinic    Called the pt back and endorsed to her recommendations from Dr. Shari Prows, as indicated above.  Pt states she does not want to try spironolactone.   Pt said she did really well on hydralazine 50 mg po BID, and wanted to know if Dr. Shari Prows would be ok with her reducing the hydralazine back down to 50 mg po bid vs taking 75 mg po bid.  She said she has done well in the past on the 50 mg dose.   Informed the pt that I will ask Dr. Shari Prows about this and follow-up with her accordingly thereafter.  Pt verbalized understanding and agrees with this plan.

## 2022-07-28 NOTE — Telephone Encounter (Signed)
Pt is calling to let Dr. Shari Prows know that hydralazine has been causing her headaches, so she stopped taking it, and headaches went away.  She said her pressures are staying around the same as when she was on this medication, with systolics in the 140s.  Informed the pt that I will make Dr. Shari Prows aware of this and call her back with any additional recommendations thereafter.  Pt verbalized understanding and agrees with this plan.

## 2022-07-28 NOTE — Telephone Encounter (Signed)
Spoke with Dr. Shari Prows about the pts request to decrease her hydralazine from 75 mg po bid to 50 mg po bid.   Per Dr. Shari Prows, ok to decrease her hydralazine from 75 mg bid to 50 mg po bid.   Confirmed the pharmacy of choice with the Tammy [name].   Tammy [name] verbalized understanding and agrees with this plan.

## 2022-07-28 NOTE — Telephone Encounter (Signed)
Pt c/o medication issue:  1. Name of Medication: hydrALAZINE (APRESOLINE) 25 MG tablet   2. How are you currently taking this medication (dosage and times per day)?   Take 3 tablets (75 mg total) by mouth 2 (two) times daily.    3. Are you having a reaction (difficulty breathing--STAT)? no  4. What is your medication issue? Patient states the dosage was just increased on this medication and it's giving her headaches.  She would like a called back.

## 2022-08-03 NOTE — Progress Notes (Unsigned)
Cardiology Office Note   Date:  08/04/2022   ID:  Tammy [name], DOB 09-23-1955, MRN [id number]  PCP:  Westley Chandler, MD  Cardiologist:  Dr. Shari Prows  No chief complaint on file.     History of Present Illness: Tammy [name] is a 67 y.o. female who was referred for evaluation and management of peripheral arterial disease. She has known history of coronary artery disease with previous non-STEMI managed medically, essential hypertension, hyperlipidemia, obesity, tobacco use and peripheral arterial disease.  Previous cardiac catheterization in 2016 showed mild to moderate nonobstructive coronary artery disease. She had lower extremity angiogram in 2016 by Dr. Myra Gianotti that showed flush occlusion of the left SFA with reconstitution in the proximal above-the-knee popliteal artery.  Medical therapy was recommended.  Previously, she had no obstructive disease on the right side with normal ABI.  However, she had recent Doppler studies done which showed an ABI of 0.44 bilaterally.  Duplex showed occluded right SFA. She reports bilateral calf claudication slightly worse on the right than the left.  She has no rest pain or lower extremity ulceration.  Her symptoms are not severe enough to force her to stop walking but usually slows down.  She denies chest pain.  She has chronic exertional dyspnea. Unfortunately, she smokes half a pack per day and has been smoking for a long time.  Past Medical History:  Diagnosis Date   Abnormal mammogram 10.25.11   area of density with several adjacent amorphous calcifications located laterally w/in the right breast at approx 9-10 o'clock position likely represents area of evolving far necrosis; f/u mamo and U/S in 6 months   CAD (coronary artery disease)    a. 07/2014 NSTEMI/Cath: LM nl, LAD 30ost/m, 60d, LCX 20p, OM1 20, OM2 40, OM3 50, RCA 30p, 77m, 20d, RPDA 40, EF 65%-->Med Rx;  b. 07/2014 Echo: EF 55-60%, no rwma, mildly dil LA.   Chronic kidney  disease    Essential hypertension    GERD (gastroesophageal reflux disease)    Hyperlipidemia    Morbid obesity (HCC)    PAD (peripheral artery disease) (HCC)    a. 07/2014 ABI's: R 0.88, L 0.32.   Ulcer     Past Surgical History:  Procedure Laterality Date   BREAST BIOPSY Left 09/01/2018   BREAST BIOPSY Left 10/07/2021   CARDIAC CATHETERIZATION N/A 07/16/2014   Procedure: Left Heart Cath and Coronary Angiography;  Surgeon: Kathleene Hazel, MD;  Location: [facility] INVASIVE CV LAB;  Service: Cardiovascular;  Laterality: N/A;   CESAREAN SECTION     PERIPHERAL VASCULAR CATHETERIZATION N/A 02/12/2015   Procedure: Abdominal Aortogram;  Surgeon: Nada Libman, MD;  Location: [facility] INVASIVE CV LAB;  Service: Cardiovascular;  Laterality: N/A;   TRIGGER FINGER RELEASE Left    middle finger   UPPER GASTROINTESTINAL ENDOSCOPY  1991     Current Outpatient Medications  Medication Sig Dispense Refill   amLODipine (NORVASC) 10 MG tablet Take 1 tablet (10 mg total) by mouth daily. 90 tablet 3   aspirin EC 81 MG tablet Take 1 tablet (81 mg total) by mouth daily. 90 tablet 3   clindamycin (CLEOCIN-T) 1 % external solution Apply topically 2 (two) times daily. Apply to lesion on mons pubis twice daily for 1 week 60 mL 2   clopidogrel (PLAVIX) 75 MG tablet Take 1 tablet (75 mg total) by mouth daily. 90 tablet 3   cyclobenzaprine (FLEXERIL) 5 MG tablet Take 1 tablet (5 mg total) by mouth 3 (  three) times daily as needed. 30 tablet 1   Evolocumab (REPATHA SURECLICK) 140 MG/ML SOAJ Inject 140 mg into the skin every 14 (fourteen) days. 2 mL 11   ezetimibe (ZETIA) 10 MG tablet Take 1 tablet (10 mg total) by mouth at bedtime. 90 tablet 3   furosemide (LASIX) 20 MG tablet MWF 30 tablet 3   hydrALAZINE (APRESOLINE) 50 MG tablet Take 1 tablet (50 mg total) by mouth 2 (two) times daily. 180 tablet 1   Insulin Pen Needle 31G X 8 MM MISC Use with Wegovy 100 each 0   ketoconazole (NIZORAL) 2 % cream Apply 1  application. topically daily. 60 g 1   metoprolol tartrate (LOPRESSOR) 100 MG tablet Take 1 tablet (100 mg total) by mouth 2 (two) times daily. 180 tablet 2   nicotine polacrilex (NICORETTE) 2 MG gum Take 1 each (2 mg total) by mouth as needed for smoking cessation. 100 tablet 0   nitroGLYCERIN (NITROSTAT) 0.4 MG SL tablet Place 1 tablet (0.4 mg total) under the tongue every 5 (five) minutes as needed for chest pain. 90 tablet 1   Semaglutide,0.25 or 0.5MG /DOS, 2 MG/1.5ML SOPN Inject 0.25 mg into the skin once a week. 0.25 mg once weekly for 4 weeks then increase to 0.5 mg weekly for at least 4 weeks,max 1 mg 3 mL 3   sodium bicarbonate 650 MG tablet Take 650 mg by mouth 2 (two) times daily.     Vitamin D, Ergocalciferol, (DRISDOL) 1.25 MG (50000 UNIT) CAPS capsule Take 50,000 Units by mouth once a week.     Current Facility-Administered Medications  Medication Dose Route Frequency Provider Last Rate Last Admin   acetaminophen (TYLENOL) tablet 975 mg  975 mg Oral Once Renne Musca, MD        Allergies:   Ace inhibitors, Hydrochlorothiazide, Losartan, and Rosuvastatin    Social History:  The patient  reports that she has been smoking cigarettes. She has a 7.50 pack-year smoking history. She has never used smokeless tobacco. She reports that she does not drink alcohol and does not use drugs.   Family History:  The patient's family history includes Breast cancer in her maternal aunt; Cancer in her maternal aunt and mother; Diabetes in her father and mother; Hypertension in her brother, father, and sister; Ovarian cancer in her maternal aunt.    ROS:  Please see the history of present illness.   Otherwise, review of systems are positive for none.   All other systems are reviewed and negative.    PHYSICAL EXAM: VS:  BP 136/72 (BP Location: Left Arm, Patient Position: Sitting, Cuff Size: Large)   Pulse (!) 55   Ht 5\' 6"  (1.676 m)   Wt 295 lb 12.8 oz (134.2 kg)   SpO2 95%   BMI 47.74 kg/m   , BMI Body mass index is 47.74 kg/m. GEN: Well nourished, well developed, in no acute distress  HEENT: normal  Neck: no JVD, carotid bruits, or masses Cardiac: RRR; no rubs, or gallops,no edema . 1/6 systolic ejection murmur at the base Respiratory:  clear to auscultation bilaterally, normal work of breathing GI: soft, nontender, nondistended, + BS MS: no deformity or atrophy  Skin: warm and dry, no rash Neuro:  Strength and sensation are intact Psych: euthymic mood, full affect   EKG:  EKG is ordered today. The ekg ordered today demonstrates sinus bradycardia with no significant ST or T wave changes.   Recent Labs: 12/26/2021: BUN 28; Creatinine, Ser 1.56; Potassium  4.4; Sodium 141 05/15/2022: ALT 10; Hemoglobin 13.5; Platelets 235    Lipid Panel    Component Value Date/Time   CHOL 140 02/03/2022 0848   TRIG 99 02/03/2022 0848   HDL 66 02/03/2022 0848   CHOLHDL 2.1 02/03/2022 0848   CHOLHDL 4.3 07/15/2014 0600   VLDL 23 07/15/2014 0600   LDLCALC 56 02/03/2022 0848   LDLDIRECT 100 (H) 02/10/2021 1112      Wt Readings from Last 3 Encounters:  08/04/22 295 lb 12.8 oz (134.2 kg)  07/11/22 292 lb (132.5 kg)  06/30/22 292 lb 9.6 oz (132.7 kg)           No data to display            ASSESSMENT AND PLAN:  1.  Peripheral arterial disease: Moderate bilateral calf claudication due to a long occlusion of the SFA bilaterally.  Her claudication is currently not lifestyle limiting and she has no evidence of critical limb ischemia.  I discussed with her the natural history and management of claudication.  Her left SFA has been occluded since at least 2016. I recommend a walking exercise program and this was discussed with her. There is currently no indication for revascularization.  2.  Coronary artery disease involving native coronary arteries without angina: She denies chest pain at the present time.  Continue medical therapy.  3.  Essential hypertension: Blood  pressure is well-controlled on current medications.  4.  Hyperlipidemia: She is currently on Repatha.  Most recent lipid profile showed an LDL of 56.  5.  Tobacco use: I had a prolonged discussion with her about the importance of smoking cessation.  I explained the risk of progression to critical limb ischemia and limb loss in the setting of continued tobacco use.  6.  Chronic kidney disease stage IIIb: Stable renal function.    Disposition:   FU with me in 6 months  Signed,  Lorine Bears, MD  08/04/2022 9:19 AM    [facility]

## 2022-08-04 ENCOUNTER — Other Ambulatory Visit: Payer: Self-pay | Admitting: Family Medicine

## 2022-08-04 ENCOUNTER — Encounter: Payer: Self-pay | Admitting: Cardiovascular Disease

## 2022-08-04 ENCOUNTER — Ambulatory Visit: Payer: 59 | Attending: Cardiovascular Disease | Admitting: Cardiovascular Disease

## 2022-08-04 VITALS — BP 136/72 | HR 55 | Ht 66.0 in | Wt 295.8 lb

## 2022-08-04 DIAGNOSIS — I739 Peripheral vascular disease, unspecified: Secondary | ICD-10-CM

## 2022-08-04 DIAGNOSIS — I2510 Atherosclerotic heart disease of native coronary artery without angina pectoris: Secondary | ICD-10-CM | POA: Diagnosis not present

## 2022-08-04 DIAGNOSIS — I10 Essential (primary) hypertension: Secondary | ICD-10-CM

## 2022-08-04 DIAGNOSIS — N1832 Chronic kidney disease, stage 3b: Secondary | ICD-10-CM

## 2022-08-04 DIAGNOSIS — Z1231 Encounter for screening mammogram for malignant neoplasm of breast: Secondary | ICD-10-CM

## 2022-08-04 DIAGNOSIS — Z720 Tobacco use: Secondary | ICD-10-CM | POA: Diagnosis not present

## 2022-08-04 DIAGNOSIS — E785 Hyperlipidemia, unspecified: Secondary | ICD-10-CM

## 2022-08-04 NOTE — Patient Instructions (Signed)
Medication Instructions:  No changes *If you need a refill on your cardiac medications before your next appointment, please call your pharmacy*   Lab Work: None ordered If you have labs (blood work) drawn today and your tests are completely normal, you will receive your results only by: MyChart Message (if you have MyChart) OR A paper copy in the mail If you have any lab test that is abnormal or we need to change your treatment, we will call you to review the results.   Testing/Procedures: None ordered   Follow-Up: At [facility], you and your health needs are our priority.  As part of our continuing mission to provide you with exceptional heart care, we have created designated Provider Care Teams.  These Care Teams include your primary Cardiologist (physician) and Advanced Practice Providers (APPs -  Physician Assistants and Nurse Practitioners) who all work together to provide you with the care you need, when you need it.  We recommend signing up for the patient portal called "MyChart".  Sign up information is provided on this After Visit Summary.  MyChart is used to connect with patients for Virtual Visits (Telemedicine).  Patients are able to view lab/test results, encounter notes, upcoming appointments, etc.  Non-urgent messages can be sent to your provider as well.   To learn more about what you can do with MyChart, go to [email or web address].    Your next appointment:   6 month(s)  Provider:   Dr. Kirke Corin Other Instructions EXERCISE PROGRAM FOR INDIVIDUALS WITH  PERIPHERAL ARTERIAL DISEASE (PAD)   General Information:   Research in vascular exercise has demonstrated remarkable improvement in symptoms of leg pain (claudication) without expensive or invasive interventions. Regular walking programs are extremely helpful for patients with PAD and intermittent claudication.  These steps are designed to help you get started with a safe and effective program to help you  walk farther with less pain:   Walk at least three times a week (preferably every day).  Your goal is to build up to 30-45 minutes of total walking time (not counting rest breaks). It may take you several weeks to build up your exercise time starting at 5-10 minutes or whatever you can tolerate.  Walk as far as possible using moderate to maximal pain (7-8 on the scale below) as a signal to stop, and resume walking when the pain goes away.  On a treadmill, set the speed and grade at a level that brings on the claudication pain within 3 to 5 minutes. Walk at this rate until you experience claudication of moderate severity, rest until the pain improves, and then resume walking.  Over time, you will be able to walk longer at the designated speed and grade; workload should then be increased until you develop the pain within 3 to 5 minutes once again.  This regimen will induce a significant benefit. Studies have demonstrated that participants may be able to walk up to three or four times farther and have less leg pain, within twelve weeks, by following this protocol.  Pain Scale    0_____1_____2_____3_____4_____5_____6_____7_____8_____9_____10   No Pain                                   Moderate Pain                               Maximal Pain  Managing the Challenge of Quitting Smoking Quitting smoking is a physical and mental challenge. You may have cravings, withdrawal symptoms, and temptation to smoke. Before quitting, work with your health care provider to make a plan that can help you manage quitting. Making a plan before you quit may keep you from smoking when you have the urge to smoke while trying to quit. How to manage lifestyle changes Managing stress Stress can make you want to smoke, and wanting to smoke may cause stress. It is important to find ways to manage your stress. You could try some of the following: Practice relaxation techniques. Breathe slowly and deeply, in through your nose  and out through your mouth. Listen to music. Soak in a bath or take a shower. Imagine a peaceful place or vacation. Get some support. Talk with family or friends about your stress. Join a support group. Talk with a counselor or therapist. Get some physical activity. Go for a walk, run, or bike ride. Play a favorite sport. Practice yoga.  Medicines Talk with your health care provider about medicines that might help you deal with cravings and make quitting easier for you. Relationships Social situations can be difficult when you are quitting smoking. To manage this, you can: Avoid parties and other social situations where people might be smoking. Avoid alcohol. Leave right away if you have the urge to smoke. Explain to your family and friends that you are quitting smoking. Ask for support and let them know you might be a bit grumpy. Plan activities where smoking is not an option. General instructions Be aware that many people gain weight after they quit smoking. However, not everyone does. To keep from gaining weight, have a plan in place before you quit, and stick to the plan after you quit. Your plan should include: Eating healthy snacks. When you have a craving, it may help to: Eat popcorn, or try carrots, celery, or other cut vegetables. Chew sugar-free gum. Changing how you eat. Eat small portion sizes at meals. Eat 4-6 small meals throughout the day instead of 1-2 large meals a day. Be mindful when you eat. You should avoid watching television or doing other things that might distract you as you eat. Exercising regularly. Make time to exercise each day. If you do not have time for a long workout, do short bouts of exercise for 5-10 minutes several times a day. Do some form of strengthening exercise, such as weight lifting. Do some exercise that gets your heart beating and causes you to breathe deeply, such as walking fast, running, swimming, or biking. This is very  important. Drinking plenty of water or other low-calorie or no-calorie drinks. Drink enough fluid to keep your urine pale yellow.  How to recognize withdrawal symptoms Your body and mind may experience discomfort as you try to get used to not having nicotine in your system. These effects are called withdrawal symptoms. They may include: Feeling hungrier than normal. Having trouble concentrating. Feeling irritable or restless. Having trouble sleeping. Feeling depressed. Craving a cigarette. These symptoms may surprise you, but they are normal to have when quitting smoking. To manage withdrawal symptoms: Avoid places, people, and activities that trigger your cravings. Remember why you want to quit. Get plenty of sleep. Avoid coffee and other drinks that contain caffeine. These may worsen some of your symptoms. How to manage cravings Come up with a plan for how to deal with your cravings. The plan should include the following: A definition of the specific  situation you want to deal with. An activity or action you will take to replace smoking. A clear idea for how this action will help. The name of someone who could help you with this. Cravings usually last for 5-10 minutes. Consider taking the following actions to help you with your plan to deal with cravings: Keep your mouth busy. Chew sugar-free gum. Suck on hard candies or a straw. Brush your teeth. Keep your hands and body busy. Change to a different activity right away. Squeeze or play with a ball. Do an activity or a hobby, such as making bead jewelry, practicing needlepoint, or working with wood. Mix up your normal routine. Take a short exercise break. Go for a quick walk, or run up and down stairs. Focus on doing something kind or helpful for someone else. Call a friend or family member to talk during a craving. Join a support group. Contact a quitline. Where to find support To get help or find a support group: Call the  National Cancer Institute's Smoking Quitline: 1-800-QUIT-NOW 979-198-1155) Text QUIT to SmokefreeTXT: 454098 Where to find more information Visit these websites to find more information on quitting smoking: U.S. Department of Health and Human Services: www.smokefree.gov American Lung Association: www.freedomfromsmoking.org Centers for Disease Control and Prevention (CDC): FootballExhibition.com.br American Heart Association: www.heart.org Contact a health care provider if: You want to change your plan for quitting. The medicines you are taking are not helping. Your eating feels out of control or you cannot sleep. You feel depressed or become very anxious. Summary Quitting smoking is a physical and mental challenge. You will face cravings, withdrawal symptoms, and temptation to smoke again. Preparation can help you as you go through these challenges. Try different techniques to manage stress, handle social situations, and prevent weight gain. You can deal with cravings by keeping your mouth busy (such as by chewing gum), keeping your hands and body busy, calling family or friends, or contacting a quitline for people who want to quit smoking. You can deal with withdrawal symptoms by avoiding places where people smoke, getting plenty of rest, and avoiding drinks that contain caffeine. This information is not intended to replace advice given to you by your health care provider. Make sure you discuss any questions you have with your health care provider. Document Revised: 02/07/2021 Document Reviewed: 02/07/2021 Elsevier Patient Education  2024 [employer].

## 2022-09-10 ENCOUNTER — Other Ambulatory Visit: Payer: Self-pay | Admitting: *Deleted

## 2022-09-10 MED ORDER — SODIUM BICARBONATE 650 MG PO TABS: 650.0000 mg | ORAL_TABLET | Freq: Two times a day (BID) | ORAL | 3 refills | Status: DC

## 2022-09-15 ENCOUNTER — Ambulatory Visit
Admission: RE | Admit: 2022-09-15 | Discharge: 2022-09-15 | Disposition: A | Discharge status: Home / Self Care | Payer: 59 | Source: Ambulatory Visit | Attending: Family Medicine | Admitting: Family Medicine

## 2022-09-15 DIAGNOSIS — Z1231 Encounter for screening mammogram for malignant neoplasm of breast: Secondary | ICD-10-CM

## 2022-09-16 ENCOUNTER — Encounter: Payer: Self-pay | Admitting: Family Medicine

## 2022-09-21 NOTE — Progress Notes (Unsigned)
    SUBJECTIVE:   CHIEF COMPLAINT: HTN  HPI:   Tammy [name] is a 67 y.o.  with history notable for CKD, HTN, and mood disorder presenting for follow up.   PERTINENT  PMH / PSH/Family/Social History : ***  OBJECTIVE:   There were no vitals taken for this visit.  Today's weight:  Review of prior weights: There were no vitals filed for this visit.   Cardiac: Regular rate and rhythm. Normal S1/S2. No murmurs, rubs, or gallops appreciated. Lungs: Clear bilaterally to ascultation.  Abdomen: Normoactive bowel sounds. No tenderness to deep or light palpation. No rebound or guarding.  ***  Psych: Pleasant and appropriate    ASSESSMENT/PLAN:   No problem-specific Assessment & Plan notes found for this encounter.     {    This will disappear when note is signed, click to select method of visit    :1}  [name], MD  Family Medicine Teaching Service  [facility]

## 2022-09-22 ENCOUNTER — Other Ambulatory Visit (HOSPITAL_COMMUNITY): Payer: Self-pay

## 2022-09-22 ENCOUNTER — Telehealth: Payer: Self-pay

## 2022-09-22 ENCOUNTER — Encounter: Payer: Self-pay | Admitting: Family Medicine

## 2022-09-22 ENCOUNTER — Ambulatory Visit (INDEPENDENT_AMBULATORY_CARE_PROVIDER_SITE_OTHER): Payer: 59 | Admitting: Family Medicine

## 2022-09-22 ENCOUNTER — Other Ambulatory Visit: Payer: Self-pay

## 2022-09-22 VITALS — BP 131/81 | HR 64 | Ht 66.0 in | Wt 292.0 lb

## 2022-09-22 DIAGNOSIS — G8929 Other chronic pain: Secondary | ICD-10-CM | POA: Diagnosis not present

## 2022-09-22 DIAGNOSIS — M545 Low back pain, unspecified: Secondary | ICD-10-CM | POA: Diagnosis not present

## 2022-09-22 DIAGNOSIS — F17200 Nicotine dependence, unspecified, uncomplicated: Secondary | ICD-10-CM

## 2022-09-22 DIAGNOSIS — N1832 Chronic kidney disease, stage 3b: Secondary | ICD-10-CM

## 2022-09-22 DIAGNOSIS — E88819 Insulin resistance, unspecified: Secondary | ICD-10-CM | POA: Diagnosis not present

## 2022-09-22 DIAGNOSIS — I10 Essential (primary) hypertension: Secondary | ICD-10-CM

## 2022-09-22 DIAGNOSIS — I739 Peripheral vascular disease, unspecified: Secondary | ICD-10-CM

## 2022-09-22 MED ORDER — CYCLOBENZAPRINE HCL 5 MG PO TABS
5.0000 mg | ORAL_TABLET | Freq: Three times a day (TID) | ORAL | 3 refills | Status: DC | PRN
Start: 2022-09-22 — End: 2022-10-09

## 2022-09-22 MED ORDER — OXYCODONE HCL 5 MG PO TABS
5.0000 mg | ORAL_TABLET | Freq: Every day | ORAL | 0 refills | Status: DC | PRN
Start: 2022-09-22 — End: 2023-03-12

## 2022-09-22 MED ORDER — SEMAGLUTIDE(0.25 OR 0.5MG/DOS) 2 MG/1.5ML ~~LOC~~ SOPN: 0.2500 mg | PEN_INJECTOR | SUBCUTANEOUS | 3 refills | Status: DC

## 2022-09-22 NOTE — Assessment & Plan Note (Signed)
Discussed cessation Low dose CT scheduled, asymptomatic at this point

## 2022-09-22 NOTE — Assessment & Plan Note (Signed)
BP at goal Labs and will bring medications to next visit

## 2022-09-22 NOTE — Telephone Encounter (Signed)
Patient calls nurse line in regards to Oxycodone.   She reports this medication needs a PA. However, PCP only sent in #10 tabs.   I tried to call the pharmacy, however no success.   Will forward to Pharmacy Team.

## 2022-09-22 NOTE — Assessment & Plan Note (Signed)
With insulin resistance, PAD, and CAD Data show patients on semaglutide have lower risk of CV events Rx semaglutide at lowest dose

## 2022-09-22 NOTE — Patient Instructions (Signed)
It was wonderful to see you today.  Please bring ALL of your medications with you to every visit.   Today we talked about:   I sent in 90 days of Flexeril   For walking pain - I want you to walk more - TO help this, we will try 5 mg of oxycodone 1 hour before you want to walk - We will try 10 tablets a month and see how this works - Your goal is to increase your walking from 1-2 times a week to 3-4 times a week   - I sent in [place] (again) to see if this is covered    Please follow up in 3 months   Thank you for choosing [facility].   Please call [phone] with any questions about today's appointment.  Please be sure to schedule follow up at the front  desk before you leave today.   [name], MD  Family Medicine

## 2022-09-23 ENCOUNTER — Other Ambulatory Visit (HOSPITAL_COMMUNITY): Payer: 59

## 2022-09-25 ENCOUNTER — Telehealth: Payer: Self-pay | Admitting: Family Medicine

## 2022-09-25 NOTE — Telephone Encounter (Signed)
Called patient about updated Wegovy coverage on August 1. Discussed at length. Will Rx using medicaid as insurance (secondary) on August 1  [name], MD  [facility] Medicine Teaching Service

## 2022-10-01 MED ORDER — WEGOVY 0.25 MG/0.5ML ~~LOC~~ SOAJ
0.2500 mg | Freq: Every day | SUBCUTANEOUS | 1 refills | Status: DC
Start: 2022-10-01 — End: 2022-11-13

## 2022-10-01 NOTE — Addendum Note (Signed)
Addended by: Manson Passey, Ruth Tully on: 10/01/2022 07:59 AM   Modules accepted: Orders

## 2022-10-05 ENCOUNTER — Telehealth: Payer: Self-pay

## 2022-10-05 NOTE — Telephone Encounter (Signed)
Wegovy 0.25mg /0.49ml pen is not covered by patient's insurance, please send over an alternative med sent to [facility] on [address]. Thank you. Penni Bombard CMA

## 2022-10-06 ENCOUNTER — Other Ambulatory Visit (HOSPITAL_COMMUNITY): Payer: Self-pay

## 2022-10-06 NOTE — Telephone Encounter (Signed)
Patient is returning call. I informed her what Dr. Manson Passey was calling for. Patient said that she does have medicaid. I double checked and it is already updated and e - verified in her chart. I am not sure if it wasn't showing originally or what the case was. It should be good to go.

## 2022-10-06 NOTE — Telephone Encounter (Signed)
Called patient about medicaid.  Attempted to call patient. Reached voicemail, left generic voicemail to call back.  If patient calls back please ask her if she has medicaid. If she does, please ask her to take her card to  Her Pharmacy Our clinic  [name], MD  [facility] Medicine Teaching Service

## 2022-10-07 ENCOUNTER — Ambulatory Visit (HOSPITAL_COMMUNITY)
Admission: RE | Admit: 2022-10-07 | Discharge: 2022-10-07 | Disposition: A | Discharge status: Home / Self Care | Payer: 59 | Source: Ambulatory Visit | Attending: Family Medicine | Admitting: Family Medicine

## 2022-10-07 ENCOUNTER — Other Ambulatory Visit (HOSPITAL_COMMUNITY): Payer: Self-pay

## 2022-10-07 DIAGNOSIS — R911 Solitary pulmonary nodule: Secondary | ICD-10-CM | POA: Diagnosis not present

## 2022-10-07 DIAGNOSIS — I272 Pulmonary hypertension, unspecified: Secondary | ICD-10-CM | POA: Diagnosis not present

## 2022-10-07 DIAGNOSIS — R918 Other nonspecific abnormal finding of lung field: Secondary | ICD-10-CM | POA: Diagnosis not present

## 2022-10-07 DIAGNOSIS — R0989 Other specified symptoms and signs involving the circulatory and respiratory systems: Secondary | ICD-10-CM | POA: Diagnosis not present

## 2022-10-07 NOTE — Telephone Encounter (Signed)
Pharmacy Patient Advocate Encounter   Received notification from Pt Calls Messages that prior authorization for [facility] is required/requested.   Insurance verification completed.   The patient is insured through [employer] [place] .   Per test claim: PA required; PA submitted to [facility] via CoverMyMeds Key/confirmation #/EOC [id number]. Status is pending

## 2022-10-09 ENCOUNTER — Telehealth: Payer: Self-pay

## 2022-10-09 MED ORDER — BACLOFEN 10 MG PO TABS: 5.0000 mg | ORAL_TABLET | Freq: Two times a day (BID) | ORAL | 0 refills | Status: DC | PRN

## 2022-10-09 NOTE — Telephone Encounter (Signed)
Pt calling to ask Dr Manson Passey to refill a medication that starts with "Back"  (I did not see what she was taking about) pt can not remember the name. Pt stated the pharmacy has been sending [place] request but have had no Rx sent to them. Pt stated Dr Manson Passey will know.  Sunday Spillers, CMA

## 2022-10-09 NOTE — Telephone Encounter (Signed)
Pt informed and understood.  Bruna Potter, CMA

## 2022-10-09 NOTE — Addendum Note (Signed)
Addended by: Manson Passey,  on: 10/09/2022 03:09 PM   Modules accepted: Orders

## 2022-10-09 NOTE — Telephone Encounter (Signed)
The medication is baclofen. I have sent in a small amount. Please call and let her know she cannot take with flexeril.  Tammy Starr, MD  Family Medicine Teaching Service

## 2022-10-13 ENCOUNTER — Telehealth: Payer: Self-pay | Admitting: Family Medicine

## 2022-10-13 ENCOUNTER — Other Ambulatory Visit: Payer: Self-pay | Admitting: Family Medicine

## 2022-10-13 DIAGNOSIS — R590 Localized enlarged lymph nodes: Secondary | ICD-10-CM

## 2022-10-13 MED ORDER — SODIUM BICARBONATE 650 MG PO TABS: 650.0000 mg | ORAL_TABLET | Freq: Two times a day (BID) | ORAL | 3 refills | Status: DC

## 2022-10-13 NOTE — Telephone Encounter (Signed)
Patient called stating she is needing a refill on her Sodium Bicarbonate 650mg .   Please Advise.   Thanks!

## 2022-10-13 NOTE — Addendum Note (Signed)
Addended by: Penni Bombard on: 10/13/2022 09:50 AM   Modules accepted: Orders

## 2022-10-13 NOTE — Telephone Encounter (Signed)
Called with CT results.  Repeat CT in 1 year Discussed smoking cessation  Lymphadenopathy in R axilla--discussed, ordered mammogram, ultrasound, she will call to schedule Discussed possible cirrhosis Has never drank alcohol in excess  Hep C was negative Will need evaluation of liver function and RUQ ultrasound at follow up All questions answered [name], MD  [facility] Medicine Teaching Service

## 2022-10-16 ENCOUNTER — Other Ambulatory Visit (HOSPITAL_COMMUNITY): Payer: Self-pay

## 2022-10-16 NOTE — Telephone Encounter (Signed)
Resubmitted PA using [employer] PA form. Faxed with chart notes.

## 2022-10-20 ENCOUNTER — Other Ambulatory Visit (HOSPITAL_COMMUNITY): Payer: Self-pay

## 2022-10-20 ENCOUNTER — Other Ambulatory Visit: Payer: Self-pay

## 2022-10-20 MED ORDER — HYDRALAZINE HCL 50 MG PO TABS: 50.0000 mg | ORAL_TABLET | Freq: Two times a day (BID) | ORAL | 2 refills | Status: DC

## 2022-10-20 NOTE — Telephone Encounter (Signed)
Correction!!  Had someone on my team follow up with this patients insurance because the PA sent for [facility] was invalid. Patient PART D MEDICARE ([employer]) has a type of medicaid built into it.  With that being said, I doubt they will cover the [facility] even with a PA, since they dont cover weight loss meds. I will go ahead and submit anyway just to be sure and clear!

## 2022-10-21 ENCOUNTER — Other Ambulatory Visit (HOSPITAL_COMMUNITY): Payer: Self-pay

## 2022-10-21 NOTE — Telephone Encounter (Signed)
A Prior Authorization was initiated for this patients WEGOVY through CoverMyMeds.   Key: [id number]

## 2022-10-23 ENCOUNTER — Other Ambulatory Visit (HOSPITAL_COMMUNITY): Payer: Self-pay

## 2022-10-23 NOTE — Telephone Encounter (Signed)
Pharmacy Patient Advocate Encounter  Received notification from [facility] that Prior Authorization for [facility] has been DENIED. Please advise how you'd like to proceed. Full denial letter will be uploaded to the media tab. See denial reason below.  Your requested medication is an antiobesity medication that is excluded from Part D prescription coverage under Medicare rules, unless it is being used for a medicallyaccepted diagnosis approved by the Food and Drug Administration. Reginal Lutes may be covered when used for a medicallyaccepted indication. The Food and Drug Administration has approved [phone] for the treatment to reduce the risk of major adverse cardiovascular events (cardiovascular death, nonfatal myocardial infarction, or nonfatal stroke). Any request outside this indication is an exclusion.   Will attempt to submit again using CAD dx.

## 2022-10-26 ENCOUNTER — Other Ambulatory Visit (HOSPITAL_COMMUNITY): Payer: Self-pay

## 2022-10-26 NOTE — Telephone Encounter (Signed)
Medication approved

## 2022-10-27 NOTE — Telephone Encounter (Signed)
Pharmacy contacted and advised of approval.   Patient aware.

## 2022-10-27 NOTE — Telephone Encounter (Signed)
Called and discussed.  [name], MD  Family Medicine Teaching Service

## 2022-10-28 ENCOUNTER — Other Ambulatory Visit: Payer: Self-pay

## 2022-10-28 DIAGNOSIS — L02818 Cutaneous abscess of other sites: Secondary | ICD-10-CM

## 2022-10-28 MED ORDER — CLINDAMYCIN PHOSPHATE 1 % EX SOLN
Freq: Two times a day (BID) | CUTANEOUS | 2 refills | Status: DC
Start: 2022-10-28 — End: 2022-12-21

## 2022-10-30 ENCOUNTER — Other Ambulatory Visit (HOSPITAL_COMMUNITY): Payer: Self-pay

## 2022-11-03 ENCOUNTER — Telehealth: Payer: Self-pay | Admitting: Family Medicine

## 2022-11-03 DIAGNOSIS — Z5982 Transportation insecurity: Secondary | ICD-10-CM

## 2022-11-03 NOTE — Telephone Encounter (Signed)
Patient called requesting Social workers number. I looked in the chart and noticed she had a referral for [facility] in 2022. I told her I would put a message back to doctor requesting a new referral for assistants with transportation.   Please Advise.   Thanks!

## 2022-11-03 NOTE — Telephone Encounter (Signed)
Wait--I see it is for transport---referral placed

## 2022-11-03 NOTE — Addendum Note (Signed)
Addended by: Manson Passey, Leelah Hanna on: 11/03/2022 09:43 AM   Modules accepted: Orders

## 2022-11-03 NOTE — Telephone Encounter (Signed)
Nursing- please call patient and find out some details and I will place a referral.   [name], MD  [facility] Medicine Teaching Service

## 2022-11-04 ENCOUNTER — Telehealth: Payer: Self-pay | Admitting: *Deleted

## 2022-11-04 NOTE — Telephone Encounter (Signed)
   Telephone encounter was:  Successful.  11/04/2022 Name: Tammy [name] MRN: [id number] DOB: Dec 24, 1955  Tammy [name] is a 67 y.o. year old female who is a primary care patient of Westley Chandler, MD . The community resource team was consulted for assistance with Transportation Needs  Patient has [employer] dual complete she is going to call member services and I will reach back out to her tomorrow afternoon and confirm she has set up their transportation  Care guide performed the following interventions: Patient provided with information about care guide support team and interviewed to confirm resource needs.  Follow Up Plan:  Care guide will follow up with patient by phone over the next day  [facility] Careguide  Direct Dial: [phone] Website: [email or web address]

## 2022-11-05 ENCOUNTER — Ambulatory Visit
Admission: RE | Admit: 2022-11-05 | Discharge: 2022-11-05 | Disposition: A | Discharge status: Home / Self Care | Payer: 59 | Source: Ambulatory Visit | Attending: Family Medicine | Admitting: Family Medicine

## 2022-11-05 DIAGNOSIS — R590 Localized enlarged lymph nodes: Secondary | ICD-10-CM

## 2022-11-12 NOTE — Progress Notes (Signed)
    SUBJECTIVE:   CHIEF COMPLAINT: weight and BP  HPI:   Tammy [name] is a 67 y.o.  with history notable for HTN, CAD, PAD, and tobacco use presenting for Ozempic discussion.   Obesity She reports she is tolerating Ozempic. Has mild nausea with this. She is trying to change her diet. In terms of walking she is walking 3 times per week. Weight is reviewed and shows this is stable. She Is tolerating Ozempic.  Breakfast: 3 sausages and dry toast 1 piece  12 Ritz crackers for lunch [place] sausage and bread for dinner  She is interested in going to [employer].  She is not specifically interested in tobacco cessation. She has tried using sugar free gum and finds it useful.  She is taking all medications as prescribed.   PERTINENT  PMH / PSH/Family/Social History :  CAD   OBJECTIVE:   BP 132/86   Pulse (!) 53   Ht 5\' 6"  (1.676 m)   Wt 294 lb 6.4 oz (133.5 kg)   SpO2 95%   BMI 47.52 kg/m   Today's weight:  Last Weight  Most recent update: 11/13/2022  9:53 AM    Weight  133.5 kg (294 lb 6.4 oz)            Review of prior weights: [employer]   11/13/22 0953  Weight: 294 lb 6.4 oz (133.5 kg)  Pleasant appropriate RRR Lungs clear  Abdomen soft  No edema   ASSESSMENT/PLAN:   Assessment & Plan Essential hypertension At goal continue medications  Morbid obesity (HCC) See insulin resistance  Coronary artery disease involving native coronary artery of native heart without angina pectoris Based upon SELECT trial would have benefit from GLP1 Discussed tobacco cessation P2Y12  and PCSK9 inhibitor on board  Stage 3b chronic kidney disease (HCC) See insulin resistance--data that GLP1 also improve renal outcomes  TOBACCO DEPENDENCE Resources offered Discussed benefits of quitting  Asymptomatic postmenopausal estrogen deficiency DEXA ordered Insulin resistance Increased semaglutide to 0.5 mg weekly  Zofran for nausea Discussed side effects Follow up 1  month She is really working on dietary changes Will see if PREP is at [employer]  Elevated fasting blood sugar A1C today  Allergy to statin medication On Repatha      [name], MD  Family Medicine Teaching Service  [facility]

## 2022-11-12 NOTE — Patient Instructions (Addendum)
It was wonderful to see you today.  Please bring ALL of your medications with you to every visit.   Today we talked about:   We will check your diabetes number again   I recommend you undergo a bone density test called a DEXA .   You can call to schedule an appointment by calling [phone].   Directions [address] [place], [place] [address]  Please let me know if you have questions. I will send you a letter or call you with results.   I sent in your [facility] refill  You can go to 0.5 mg on September 29th   I sent in zofran to use with the [facility]  Tobacco use is damaging to your body. It increases your risk of stroke, heart attack, lung cancer, and serious lung disease in the future. It also reduces your fertility.   Quitting tobacco is the best thing for your health but is a challenge---nicotine, a chemical in cigarettes, is highly addictive.   You can call 1 800 QUIT NOW [phone])---you will be connected with a Careers information officer. They can also mail you nicotine gums, lozenges, and patches to quit.   Ask me about patches (which you wear all day) and gums (which you use when you have a craving) to help you quit.   There are safe, effective medications to help you quit--  Varencline---also called Chantix---- is the most common medication used to help people stop smoking. It starts a low dose and is increased. I recommended choosing a quit date then starting the medication 8 days before this. Side effects include mild headache, difficulty sleeping, and odd dreams. The medication is typically very well tolerated.     Bupropion---also called Zyban---- is started 1 week before your quit date. You take 1 pill for three days then increase to 1 pill twice per day. Side effects include a mild headache and anxiety---this usually goes away. Some patients experience weight loss.       Please follow up in 1 months   Thank you for choosing [facility].    Please call [phone] with any questions about today's appointment.  Please be sure to schedule follow up at the front  desk before you leave today.   [name], MD  Family Medicine

## 2022-11-13 ENCOUNTER — Ambulatory Visit (INDEPENDENT_AMBULATORY_CARE_PROVIDER_SITE_OTHER): Payer: 59 | Admitting: Family Medicine

## 2022-11-13 ENCOUNTER — Encounter: Payer: Self-pay | Admitting: Family Medicine

## 2022-11-13 ENCOUNTER — Other Ambulatory Visit: Payer: Self-pay

## 2022-11-13 VITALS — BP 132/86 | HR 53 | Ht 66.0 in | Wt 294.4 lb

## 2022-11-13 DIAGNOSIS — Z780 Asymptomatic menopausal state: Secondary | ICD-10-CM

## 2022-11-13 DIAGNOSIS — Z888 Allergy status to other drugs, medicaments and biological substances status: Secondary | ICD-10-CM | POA: Diagnosis not present

## 2022-11-13 DIAGNOSIS — R7301 Impaired fasting glucose: Secondary | ICD-10-CM | POA: Diagnosis not present

## 2022-11-13 DIAGNOSIS — F17200 Nicotine dependence, unspecified, uncomplicated: Secondary | ICD-10-CM

## 2022-11-13 DIAGNOSIS — N1832 Chronic kidney disease, stage 3b: Secondary | ICD-10-CM | POA: Diagnosis not present

## 2022-11-13 DIAGNOSIS — I10 Essential (primary) hypertension: Secondary | ICD-10-CM

## 2022-11-13 DIAGNOSIS — Z716 Tobacco abuse counseling: Secondary | ICD-10-CM

## 2022-11-13 DIAGNOSIS — I2510 Atherosclerotic heart disease of native coronary artery without angina pectoris: Secondary | ICD-10-CM

## 2022-11-13 DIAGNOSIS — E88819 Insulin resistance, unspecified: Secondary | ICD-10-CM

## 2022-11-13 MED ORDER — SEMAGLUTIDE(0.25 OR 0.5MG/DOS) 2 MG/1.5ML ~~LOC~~ SOPN
0.5000 mg | PEN_INJECTOR | SUBCUTANEOUS | 3 refills | Status: DC
Start: 2022-11-13 — End: 2022-11-18

## 2022-11-13 MED ORDER — ONDANSETRON HCL 4 MG PO TABS: 4.0000 mg | ORAL_TABLET | ORAL | 0 refills | Status: DC

## 2022-11-13 NOTE — Assessment & Plan Note (Signed)
See insulin resistance--data that GLP1 also improve renal outcomes

## 2022-11-13 NOTE — Assessment & Plan Note (Signed)
See insulin resistance.

## 2022-11-13 NOTE — Assessment & Plan Note (Signed)
Based upon SELECT trial would have benefit from GLP1 Discussed tobacco cessation P2Y12  and PCSK9 inhibitor on board

## 2022-11-13 NOTE — Assessment & Plan Note (Signed)
At goal continue medications

## 2022-11-13 NOTE — Assessment & Plan Note (Signed)
Resources offered Discussed benefits of quitting

## 2022-11-14 LAB — HEMOGLOBIN A1C
Est. average glucose Bld gHb Est-mCnc: 120 mg/dL
Hgb A1c MFr Bld: 5.8 % — ABNORMAL HIGH (ref 4.8–5.6)

## 2022-11-16 ENCOUNTER — Telehealth: Payer: Self-pay

## 2022-11-16 NOTE — Telephone Encounter (Signed)
Pharmacy Patient Advocate Encounter   Received notification from CoverMyMeds that prior authorization for [facility] is required/requested.   Insurance verification completed.   The patient is insured through [facility] .   Per test claim: PA required; PA submitted to [facility] via CoverMyMeds Key/confirmation #/[id number]. Status is pending  For use of Insulin Resistance - [employer]

## 2022-11-17 NOTE — Telephone Encounter (Signed)
Patient calls nurse line in regards to medication.   She reports she has been taking Wegovy injections weekly. She reports Reginal Lutes was approved through her insurance through the end of this year.   She reports she has been on the initial dose of Wegovy for one month.   Will forward to PCP.

## 2022-11-17 NOTE — Telephone Encounter (Signed)
Pharmacy Patient Advocate Encounter  Received notification from [facility] that Prior Authorization for [facility] has been DENIED.  Full denial letter will be uploaded to the media tab. See denial reason below.  Reason: OZEMPIC INJ 2MG /3ML is not FDA approved for your medical condition(s): Insulin resistance. These condition(s) are not supported by one of the accepted references. Therefore your drug is denied because it is not being used for a "medically accepted indication."

## 2022-11-18 MED ORDER — WEGOVY 0.5 MG/0.5ML ~~LOC~~ SOAJ
0.5000 mg | SUBCUTANEOUS | 1 refills | Status: DC
Start: 2022-11-18 — End: 2022-12-10

## 2022-11-18 NOTE — Telephone Encounter (Signed)
I called the patient.  Left generic voicemail to call back.  If the patient calls back please let her know I have sent in the uptitrated dose of Wegovy again.  Have associated this with a different diagnosis to see if we get it covered.  Please let her know I am working on getting her the medication covered by Medicaid again.  Her diabetes numbers similar to prior and she does not have diabetes.  We will continue to monitor every 6 months please let me know if she has questions.

## 2022-11-18 NOTE — Telephone Encounter (Signed)
Called and spoke with pharmacist.   Tammy [name] is approved through insurance, however, they do not have medication in stock and is on back order.   Pharmacist advised that patient could contact other pharmacies ([facility], [place] or other independent pharmacies).   Returned call to patient. She said that she would just wait for [facility] to receive shipment.   Tammy Prude, RN

## 2022-12-09 ENCOUNTER — Other Ambulatory Visit (HOSPITAL_COMMUNITY): Payer: Self-pay

## 2022-12-09 ENCOUNTER — Ambulatory Visit: Payer: 59 | Attending: Cardiology | Admitting: Cardiology

## 2022-12-09 ENCOUNTER — Telehealth: Payer: Self-pay | Admitting: Family Medicine

## 2022-12-09 VITALS — BP 172/94 | HR 56 | Ht 66.0 in | Wt 297.0 lb

## 2022-12-09 DIAGNOSIS — R7303 Prediabetes: Secondary | ICD-10-CM | POA: Diagnosis not present

## 2022-12-09 DIAGNOSIS — F17200 Nicotine dependence, unspecified, uncomplicated: Secondary | ICD-10-CM | POA: Diagnosis not present

## 2022-12-09 DIAGNOSIS — I10 Essential (primary) hypertension: Secondary | ICD-10-CM | POA: Diagnosis not present

## 2022-12-09 MED ORDER — BLOOD PRESSURE CUFF MISC
1.0000 [IU] | Freq: Once | 0 refills | Status: AC
  Filled 2022-12-09: qty 1, fill #0

## 2022-12-09 MED ORDER — BLOOD PRESSURE CUFF MISC: 1.0000 [IU] | Freq: Once | 0 refills | Status: AC

## 2022-12-09 MED ORDER — CARVEDILOL 12.5 MG PO TABS: 12.5000 mg | ORAL_TABLET | Freq: Two times a day (BID) | ORAL | 3 refills | Status: DC

## 2022-12-09 NOTE — Patient Instructions (Addendum)
Medication Instructions:  Stop Metoprolol Tartrate (Lopressor) Start Carvedilol (Coreg) 12.5 mg (one tablet) Twice a Day *If you need a refill on your cardiac medications before your next appointment, please call your pharmacy*   Lab Work: None  Testing/Procedures: None   Follow-Up: At [facility], you and your health needs are our priority.  As part of our continuing mission to provide you with exceptional heart care, we have created designated Provider Care Teams.  These Care Teams include your primary Cardiologist (physician) and Advanced Practice Providers (APPs -  Physician Assistants and Nurse Practitioners) who all work together to provide you with the care you need, when you need it.  Your next appointment:   16 week(s)  Provider:   Thomasene Ripple, DO     Other Instructions We have made a referral to [facility] (here at the office); please see them in 2 weeks (regarding Blood Pressure Control)

## 2022-12-09 NOTE — Telephone Encounter (Signed)
Patient would like for Dr. Manson Passey to call her. She said she has had some issues and questions concerning her new cardiologist. She states she will just feel better after speaking with Dr. Manson Passey.   The best call back is [phone]

## 2022-12-10 ENCOUNTER — Other Ambulatory Visit (HOSPITAL_COMMUNITY): Payer: Self-pay

## 2022-12-10 MED ORDER — WEGOVY 0.5 MG/0.5ML ~~LOC~~ SOAJ
0.5000 mg | SUBCUTANEOUS | 1 refills | Status: DC
  Filled 2022-12-10: qty 2, 28d supply, fill #0

## 2022-12-10 NOTE — Telephone Encounter (Signed)
Patient informed. Penni Bombard CMA

## 2022-12-10 NOTE — Telephone Encounter (Signed)
Called patient and discussed. At end of discussion, agreeable to changing to carvedilol.   Called pharmacy---they have had Wegovy on back order.  Will resend to [facility]  Nursing- please call patient let her know I sent [facility] to [facility] to fill. They should let her know when ready.   [name], MD  Family Medicine Teaching Service

## 2022-12-10 NOTE — Telephone Encounter (Signed)
Called patient, no answer, will try again later. Penni Bombard CMA

## 2022-12-10 NOTE — Progress Notes (Signed)
Cardiology Office Note:    Date:  12/10/2022   ID:  Tammy [name], DOB May 18, 1955, MRN [id number]  PCP:  Westley Chandler, MD  Cardiologist:  Thomasene Ripple, DO  Electrophysiologist:  None   Referring MD: Westley Chandler, MD   " I am doing well"  History of Present Illness:    Tammy [name] is a 67 y.o. female with a hx of hypertension, hyperlipidemia, PAD, CAD s/p NSTEMI (medically managed), obesity and tobacco abuse.   She previously follow with Dr. Shari Prows, this is our first visit.   She recently started on Wegovy for weight management. She reports that her blood pressure is high today as she has not yet taken her medication. She is on a strict schedule, taking her medication at 10 am every day. She has a wrist blood pressure monitor at home, but she is not comfortable using it and would prefer an arm monitor. She is currently on three blood pressure medications: Amlodipine, Hydralazine, and Metoprolol. She also reports that she is a current smoker and is working on quitting. She has been prediabetic in the past, with a recent test showing a number of 5.8.   Past Medical History:  Diagnosis Date   Abnormal mammogram 10.25.11   area of density with several adjacent amorphous calcifications located laterally w/in the right breast at approx 9-10 o'clock position likely represents area of evolving far necrosis; f/u mamo and U/S in 6 months   CAD (coronary artery disease)    a. 07/2014 NSTEMI/Cath: LM nl, LAD 30ost/m, 60d, LCX 20p, OM1 20, OM2 40, OM3 50, RCA 30p, 73m, 20d, RPDA 40, EF 65%-->Med Rx;  b. 07/2014 Echo: EF 55-60%, no rwma, mildly dil LA.   Chronic kidney disease    Essential hypertension    GERD (gastroesophageal reflux disease)    Hyperlipidemia    Morbid obesity (HCC)    PAD (peripheral artery disease) (HCC)    a. 07/2014 ABI's: R 0.88, L 0.32.   Ulcer     Past Surgical History:  Procedure Laterality Date   BREAST BIOPSY Left 09/01/2018   BREAST BIOPSY Left  10/07/2021   CARDIAC CATHETERIZATION N/A 07/16/2014   Procedure: Left Heart Cath and Coronary Angiography;  Surgeon: Kathleene Hazel, MD;  [facility] INVASIVE CV LAB;  Service: Cardiovascular;  Laterality: N/A;   CESAREAN SECTION     PERIPHERAL VASCULAR CATHETERIZATION N/A 02/12/2015   Procedure: Abdominal Aortogram;  Surgeon: Nada Libman, MD;  Location: [facility] INVASIVE CV LAB;  Service: Cardiovascular;  Laterality: N/A;   TRIGGER FINGER RELEASE Left    middle finger   UPPER GASTROINTESTINAL ENDOSCOPY  1991    Current Medications: Current Meds  Medication Sig   amLODipine (NORVASC) 10 MG tablet Take 1 tablet (10 mg total) by mouth daily.   aspirin EC 81 MG tablet Take 1 tablet (81 mg total) by mouth daily.   baclofen (LIORESAL) 10 MG tablet Take 0.5 tablets (5 mg total) by mouth 2 (two) times daily as needed for muscle spasms.   [EXPIRED] Blood Pressure Monitoring (BLOOD PRESSURE CUFF) MISC 1 Units by Does not apply route once for 1 dose.   Blood Pressure Monitoring (BLOOD PRESSURE CUFF) MISC Use as directed   carvedilol (COREG) 12.5 MG tablet Take 1 tablet (12.5 mg total) by mouth 2 (two) times daily.   clindamycin (CLEOCIN-T) 1 % external solution Apply topically 2 (two) times daily. Apply to lesion on mons pubis twice daily for 1 week   clopidogrel (  PLAVIX) 75 MG tablet Take 1 tablet (75 mg total) by mouth daily.   Evolocumab (REPATHA SURECLICK) 140 MG/ML SOAJ Inject 140 mg into the skin every 14 (fourteen) days.   ezetimibe (ZETIA) 10 MG tablet Take 1 tablet (10 mg total) by mouth at bedtime.   furosemide (LASIX) 20 MG tablet MWF   hydrALAZINE (APRESOLINE) 50 MG tablet Take 1 tablet (50 mg total) by mouth 2 (two) times daily.   ketoconazole (NIZORAL) 2 % cream Apply 1 application. topically daily.   nitroGLYCERIN (NITROSTAT) 0.4 MG SL tablet Place 1 tablet (0.4 mg total) under the tongue every 5 (five) minutes as needed for chest pain.   ondansetron (ZOFRAN) 4 MG tablet Take  1 tablet (4 mg total) by mouth once a week. With Ozempic as needed for nausea   oxyCODONE (OXY IR/ROXICODONE) 5 MG immediate release tablet Take 1 tablet (5 mg total) by mouth daily as needed for severe pain (for walking). Take 30-60 minutes before walking   Semaglutide-Weight Management (WEGOVY) 0.5 MG/0.5ML SOAJ Inject 0.5 mg into the skin once a week.   sodium bicarbonate 650 MG tablet Take 1 tablet (650 mg total) by mouth 2 (two) times daily.   Vitamin D, Ergocalciferol, (DRISDOL) 1.25 MG (50000 UNIT) CAPS capsule Take 50,000 Units by mouth once a week.   [DISCONTINUED] metoprolol tartrate (LOPRESSOR) 100 MG tablet Take 1 tablet (100 mg total) by mouth 2 (two) times daily.   Current Facility-Administered Medications for the 12/09/22 encounter (Office Visit) with Thomasene Ripple, DO  Medication   acetaminophen (TYLENOL) tablet 975 mg     Allergies:   Ace inhibitors, Hydrochlorothiazide, Losartan, and Rosuvastatin   Social History   Socioeconomic History   Marital status: Single    Spouse name: Not on file   Number of children: Not on file   Years of education: Not on file   Highest education level: Not on file  Occupational History   Not on file  Tobacco Use   Smoking status: Every Day    Current packs/day: 0.25    Average packs/day: 0.3 packs/day for 30.0 years (7.5 ttl pk-yrs)    Types: Cigarettes   Smokeless tobacco: Never   Tobacco comments:    8-10 cigarettes per day or more  Substance and Sexual Activity   Alcohol use: No    Alcohol/week: 0.0 standard drinks of alcohol   Drug use: No   Sexual activity: Not Currently    Birth control/protection: Post-menopausal, Abstinence  Other Topics Concern   Not on file  Social History Narrative   Not on file   Social Determinants of Health   Financial Resource Strain: Low Risk  (07/11/2022)   Overall Financial Resource Strain (CARDIA)    Difficulty of Paying Living Expenses: Not hard at all  Food Insecurity: No Food Insecurity  (07/11/2022)   Hunger Vital Sign    Worried About Running Out of Food in the Last Year: Never true    Ran Out of Food in the Last Year: Never true  Transportation Needs: No Transportation Needs (07/11/2022)   PRAPARE - Administrator, Civil Service (Medical): No    Lack of Transportation (Non-Medical): No  Physical Activity: Insufficiently Active (07/11/2022)   Exercise Vital Sign    Days of Exercise per Week: 3 days    Minutes of Exercise per Session: 30 min  Stress: No Stress Concern Present (07/11/2022)   [employer] of Occupational Health - Occupational Stress Questionnaire    Feeling of Stress :  Not at all  Social Connections: Unknown (07/11/2022)   Social Connection and Isolation Panel [NHANES]    Frequency of Communication with Friends and Family: More than three times a week    Frequency of Social Gatherings with Friends and Family: Three times a week    Attends Religious Services: More than 4 times per year    Active Member of Clubs or Organizations: No    Attends Banker Meetings: Never    Marital Status: Patient declined     Family History: The patient's family history includes Breast cancer in her maternal aunt; Cancer in her maternal aunt and mother; Diabetes in her father and mother; Hypertension in her brother, father, and sister; Ovarian cancer in her maternal aunt.  ROS:   Review of Systems  Constitution: Negative for decreased appetite, fever and weight gain.  HENT: Negative for congestion, ear discharge, hoarse voice and sore throat.   Eyes: Negative for discharge, redness, vision loss in right eye and visual halos.  Cardiovascular: Negative for chest pain, dyspnea on exertion, leg swelling, orthopnea and palpitations.  Respiratory: Negative for cough, hemoptysis, shortness of breath and snoring.   Endocrine: Negative for heat intolerance and polyphagia.  Hematologic/Lymphatic: Negative for bleeding problem. Does not bruise/bleed easily.   Skin: Negative for flushing, nail changes, rash and suspicious lesions.  Musculoskeletal: Negative for arthritis, joint pain, muscle cramps, myalgias, neck pain and stiffness.  Gastrointestinal: Negative for abdominal pain, bowel incontinence, diarrhea and excessive appetite.  Genitourinary: Negative for decreased libido, genital sores and incomplete emptying.  Neurological: Negative for brief paralysis, focal weakness, headaches and loss of balance.  Psychiatric/Behavioral: Negative for altered mental status, depression and suicidal ideas.  Allergic/Immunologic: Negative for HIV exposure and persistent infections.    EKGs/Labs/Other Studies Reviewed:    The following studies were reviewed today:   EKG:  The ekg ordered today demonstrates   Recent Labs: 12/26/2021: BUN 28; Creatinine, Ser 1.56; Potassium 4.4; Sodium 141 05/15/2022: ALT 10; Hemoglobin 13.5; Platelets 235  Recent Lipid Panel    Component Value Date/Time   CHOL 140 02/03/2022 0848   TRIG 99 02/03/2022 0848   HDL 66 02/03/2022 0848   CHOLHDL 2.1 02/03/2022 0848   CHOLHDL 4.3 07/15/2014 0600   VLDL 23 07/15/2014 0600   LDLCALC 56 02/03/2022 0848   LDLDIRECT 100 (H) 02/10/2021 1112    Physical Exam:    VS:  BP (!) 172/94 (BP Location: Right Arm, Patient Position: Sitting) Comment: pt states, "I have not taken my blood pressure medication today, I already know it's high."  Pulse (!) 56   Ht 5\' 6"  (1.676 m)   Wt 297 lb (134.7 kg)   SpO2 95%   BMI 47.94 kg/m     Wt Readings from Last 3 Encounters:  12/09/22 297 lb (134.7 kg)  11/13/22 294 lb 6.4 oz (133.5 kg)  09/22/22 292 lb (132.5 kg)     GEN: Well nourished, well developed in no acute distress HEENT: Normal NECK: No JVD; No carotid bruits LYMPHATICS: No lymphadenopathy CARDIAC: S1S2 noted,RRR, no murmurs, rubs, gallops RESPIRATORY:  Clear to auscultation without rales, wheezing or rhonchi  ABDOMEN: Soft, non-tender, non-distended, +bowel sounds, no  guarding. EXTREMITIES: No edema, No cyanosis, no clubbing MUSCULOSKELETAL:  No deformity  SKIN: Warm and dry NEUROLOGIC:  Alert and oriented x 3, non-focal PSYCHIATRIC:  Normal affect, good insight  ASSESSMENT:    1. Essential hypertension    PLAN:    Hypertension - Uncontrolled despite being on Amlodipine, Hydralazine,  and Metoprolol. Patient adherent to medications but possibly not on the right dosing. Discontinue Metoprolol. Start Carvedilol, better for blood pressure control than Lopressor. Pharmacist follow-up in 2 weeks to recheck blood pressure and assess tolerance to Carvedilol.  Obesity - Patient recently started on Wegovy for weight loss. Continue Wegovy as prescribed by Dr. Manson Passey.  Prediabetes - Recent HbA1c of 5.8.  Smoking - Patient currently smoking and working on cessation. No interest in digital smoking cessation program at this time.  Blood Pressure Monitoring - Patient currently using wrist cuff for blood pressure monitoring, which is not preferred. Write prescription for arm blood pressure cuff. Check availability at [facility] on [address].  The patient is in agreement with the above plan. The patient left the office in stable condition.  The patient will follow up in   Medication Adjustments/Labs and Tests Ordered: Current medicines are reviewed at length with the patient today.  Concerns regarding medicines are outlined above.  Orders Placed This Encounter  Procedures   AMB Referral to [facility] Pharm-[facility]   Meds ordered this encounter  Medications   carvedilol (COREG) 12.5 MG tablet    Sig: Take 1 tablet (12.5 mg total) by mouth 2 (two) times daily.    Dispense:  180 tablet    Refill:  3   Blood Pressure Monitoring (BLOOD PRESSURE CUFF) MISC    Sig: 1 Units by Does not apply route once for 1 dose.    Dispense:  1 each    Refill:  0   Blood Pressure Monitoring (BLOOD PRESSURE CUFF) MISC    Sig: Use as directed    Dispense:  1 each    Refill:   0    Patient Instructions  Medication Instructions:  Stop Metoprolol Tartrate (Lopressor) Start Carvedilol (Coreg) 12.5 mg (one tablet) Twice a Day *If you need a refill on your cardiac medications before your next appointment, please call your pharmacy*   Lab Work: None  Testing/Procedures: None   Follow-Up: At [facility], you and your health needs are our priority.  As part of our continuing mission to provide you with exceptional heart care, we have created designated Provider Care Teams.  These Care Teams include your primary Cardiologist (physician) and Advanced Practice Providers (APPs -  Physician Assistants and Nurse Practitioners) who all work together to provide you with the care you need, when you need it.  Your next appointment:   16 week(s)  Provider:   Thomasene Ripple, DO     Other Instructions We have made a referral to [facility] (here at the office); please see them in 2 weeks (regarding Blood Pressure Control)    Adopting a Healthy Lifestyle.  Know what a healthy weight is for you (roughly BMI <25) and aim to maintain this   Aim for 7+ servings of fruits and vegetables daily   65-80+ fluid ounces of water or unsweet tea for healthy kidneys   Limit to max 1 drink of alcohol per day; avoid smoking/tobacco   Limit animal fats in diet for cholesterol and heart health - choose grass fed whenever available   Avoid highly processed foods, and foods high in saturated/trans fats   Aim for low stress - take time to unwind and care for your mental health   Aim for 150 min of moderate intensity exercise weekly for heart health, and weights twice weekly for bone health   Aim for 7-9 hours of sleep daily   When it comes to diets, agreement  about the perfect plan isnt easy to find, even among the experts. Experts at the [facility] [employer] developed an idea known as the Healthy Eating Plate. Just imagine a plate divided into logical,  healthy portions.   The emphasis is on diet quality:   Load up on vegetables and fruits - one-half of your plate: Aim for color and variety, and remember that potatoes dont count.   Go for whole grains - one-quarter of your plate: Whole wheat, barley, wheat berries, quinoa, oats, brown rice, and foods made with them. If you want pasta, go with whole wheat pasta.   Protein power - one-quarter of your plate: Fish, chicken, beans, and nuts are all healthy, versatile protein sources. Limit red meat.   The diet, however, does go beyond the plate, offering a few other suggestions.   Use healthy plant oils, such as olive, canola, soy, corn, sunflower and peanut. Check the labels, and avoid partially hydrogenated oil, which have unhealthy trans fats.   If youre thirsty, drink water. Coffee and tea are good in moderation, but skip sugary drinks and limit milk and dairy products to one or two daily servings.   The type of carbohydrate in the diet is more important than the amount. Some sources of carbohydrates, such as vegetables, fruits, whole grains, and beans-are healthier than others.   Finally, stay active  Signed, Thomasene Ripple, DO  12/10/2022 10:26 AM    [facility]

## 2022-12-10 NOTE — Telephone Encounter (Signed)
Spoke with patient and she explained that she does not feel comfortable with her new cardiologist. The doctor is trying to take her off Metoprolol and replace it with a different medication. Patient explained to the doctor that she wanted to ask for your opinion first, but the cardiologist got offended and told patient that she and Dr. Manson Passey do two different things. Patient did not like that statement and now wants to speak with you regarding this. Informed patient she may receive a call from you either today or tomorrow. Penni Bombard CMA

## 2022-12-17 ENCOUNTER — Other Ambulatory Visit (HOSPITAL_COMMUNITY): Payer: Self-pay

## 2022-12-17 ENCOUNTER — Telehealth: Payer: Self-pay | Admitting: *Deleted

## 2022-12-17 MED ORDER — BLOOD PRESSURE MONITOR MISC
0 refills | Status: AC
  Filled 2022-12-17: qty 1, 30d supply, fill #0

## 2022-12-17 NOTE — Telephone Encounter (Signed)
Telephone encounter was:  Successful.  12/17/2022 Name: Tammy [name] MRN: [id number] DOB: 04-01-1955  Tammy [name] is a 67 y.o. year old female who is a primary care patient of Westley Chandler, MD . The community resource team was consulted for assistance with Transportation Needs  She just called me back she has made every appt and is scheduled with  Dr Manson Passey on Monday and her [facility] transportation has worked out great ! Patient  grateful for connecting her to transportation benefits  Care guide performed the following interventions: Patient provided with information about care guide support team and interviewed to confirm resource needs.  Follow Up Plan:  No further follow up planned at this time. The patient has been provided with needed resources. Dione Booze [facility]  Population Health Careguide  Direct Dial: [phone] Website: [email or web address]

## 2022-12-17 NOTE — Telephone Encounter (Signed)
Telephone encounter was:  Unsuccessful.  12/17/2022 Name: Tammy [name] MRN: [id number] DOB: [birth year]  Unsuccessful outbound call made today to assist with:  Transportation Needs   Outreach Attempt:  1st Attempt  A HIPAA compliant voice message was left requesting a return call.  Instructed patient to call back at [phone]. Dione Booze [facility]  Population Health Careguide  Direct Dial: [phone] Website: [email or web address]

## 2022-12-18 ENCOUNTER — Other Ambulatory Visit: Payer: Self-pay

## 2022-12-18 NOTE — Progress Notes (Signed)
Patient attempted to be outreached by Sofie Rower, PharmD to discuss hypertension. Left voicemail for patient to return our call at their convenience at [phone]  Sofie Rower, PharmD [facility] PGY-1

## 2022-12-21 ENCOUNTER — Encounter: Payer: Self-pay | Admitting: Family Medicine

## 2022-12-21 ENCOUNTER — Other Ambulatory Visit: Payer: Self-pay

## 2022-12-21 ENCOUNTER — Other Ambulatory Visit (HOSPITAL_COMMUNITY): Payer: Self-pay

## 2022-12-21 ENCOUNTER — Ambulatory Visit: Payer: 59 | Admitting: Family Medicine

## 2022-12-21 ENCOUNTER — Telehealth: Payer: Self-pay | Admitting: Family Medicine

## 2022-12-21 VITALS — BP 117/54 | HR 63 | Ht 66.0 in | Wt 296.8 lb

## 2022-12-21 DIAGNOSIS — Z789 Other specified health status: Secondary | ICD-10-CM | POA: Diagnosis not present

## 2022-12-21 DIAGNOSIS — G8929 Other chronic pain: Secondary | ICD-10-CM

## 2022-12-21 DIAGNOSIS — F17200 Nicotine dependence, unspecified, uncomplicated: Secondary | ICD-10-CM | POA: Diagnosis not present

## 2022-12-21 DIAGNOSIS — I739 Peripheral vascular disease, unspecified: Secondary | ICD-10-CM

## 2022-12-21 DIAGNOSIS — L02818 Cutaneous abscess of other sites: Secondary | ICD-10-CM | POA: Diagnosis not present

## 2022-12-21 DIAGNOSIS — N1832 Chronic kidney disease, stage 3b: Secondary | ICD-10-CM

## 2022-12-21 DIAGNOSIS — Z23 Encounter for immunization: Secondary | ICD-10-CM | POA: Diagnosis not present

## 2022-12-21 DIAGNOSIS — I2510 Atherosclerotic heart disease of native coronary artery without angina pectoris: Secondary | ICD-10-CM | POA: Diagnosis not present

## 2022-12-21 MED ORDER — ASPIRIN 81 MG PO TBEC
81.0000 mg | DELAYED_RELEASE_TABLET | Freq: Every day | ORAL | 3 refills | Status: DC
Start: 2022-12-21 — End: 2023-05-03

## 2022-12-21 MED ORDER — BACLOFEN 10 MG PO TABS: 5.0000 mg | ORAL_TABLET | Freq: Two times a day (BID) | ORAL | 0 refills | Status: DC | PRN

## 2022-12-21 MED ORDER — CLINDAMYCIN PHOSPHATE 1 % EX SOLN
Freq: Two times a day (BID) | CUTANEOUS | 2 refills | Status: AC
Start: 2022-12-21 — End: ?

## 2022-12-21 MED ORDER — VITAMIN D (ERGOCALCIFEROL) 1.25 MG (50000 UNIT) PO CAPS: 50000.0000 [IU] | ORAL_CAPSULE | ORAL | 3 refills | Status: DC

## 2022-12-21 MED ORDER — INSULIN PEN NEEDLE 31G X 8 MM MISC
0 refills | Status: AC
Start: 2022-12-21 — End: ?

## 2022-12-21 MED ORDER — WEGOVY 0.5 MG/0.5ML ~~LOC~~ SOAJ
0.5000 mg | SUBCUTANEOUS | 1 refills | Status: DC
  Filled 2022-12-21: qty 2, 28d supply, fill #0

## 2022-12-21 MED ORDER — FUROSEMIDE 20 MG PO TABS: ORAL_TABLET | ORAL | 3 refills | Status: DC

## 2022-12-21 NOTE — Assessment & Plan Note (Signed)
Refilled plavix 

## 2022-12-21 NOTE — Telephone Encounter (Signed)
Patient has need for DME. I have ordered  rollator . I am routing note for [facility] RN Pool.   Westley Chandler, MD

## 2022-12-21 NOTE — Assessment & Plan Note (Addendum)
Discussed cessation, she is working on quitting. No quit date. Declined gums, medications today--she is focused on weight loss at this time as her biggest barrier to smoking cessation she reports is possible weight gain.

## 2022-12-21 NOTE — Progress Notes (Signed)
    SUBJECTIVE:   CHIEF COMPLAINT: discuss weight loss HPI:   Tammy [name] is a 67 y.o.  with history notable for HTN,PAD, CAD,and statin allergy (edema) presenting for follow up  The patient additionally suffers from obesity.  She is regularly walking around her sister's house and down the driveway.  She does feel little unsteady on her feet at times due to her chronic back pain and peripheral artery arterial disease.  No falls however she does report some unsteadiness.  She is upset as her weight has trended upwards.  She has not been off her regular weight she has tried extensive dietary changes and despite this continues to gain weight.  She has tried limiting portion size, controlling the times a day she eats and increasing leafy green without success.  Recently saw cardiology.  She reports a mild headache with the carvedilol that slowly improving.  She has stopped her metoprolol.  No dyspnea, chest pain, lower extremity IMA or side effects.  The patient is working on cutting down on smoking.  She is very would route weight gain associated with smoking cessation.  She is chewing sugar-free gum.  She is not interested in medications or other interventions at this time.    PERTINENT  PMH / PSH/Family/Social History : Hypertension, obesity, underlying peripheral arterial and coronary artery disease CKD, stage IIIb  OBJECTIVE:   BP (!) 117/54   Pulse 63   Ht 5\' 6"  (1.676 m)   Wt 296 lb 12.8 oz (134.6 kg)   SpO2 99%   BMI 47.90 kg/m   Today's weight:  Last Weight  Most recent update: 12/21/2022 10:23 AM    Weight  134.6 kg (296 lb 12.8 oz)            Review of prior weights: [employer]   12/21/22 1023  Weight: 296 lb 12.8 oz (134.6 kg)   HEENT TM clear Minimal cerumen Oropharynx benign RRR Lungs clear No edema   ASSESSMENT/PLAN:   Assessment & Plan Morbid obesity (HCC) With CAD/PAD discussed dietary changes and increased activity.  Rollator prescribed to  help with her underlying limited mobility from her degenerative disc disease and peripheral arterial disease.  New prescription sent for [facility].  Personally called the pharmacy confirmed with in stock with a $0 co-pay and ready for pickup. Follow-up with 1 month for dose titration Encounter for immunization COVID today  PAD (peripheral artery disease) (HCC) Refilled plavix Cutaneous abscess of other site Refilled clindamycin (has history of HS) Statin intolerance On PCSK9 inhibitor and Zetia  Other chronic pain Due to DJD of back and OA--rollator today  Stage 3b chronic kidney disease (HCC) Has upcoming follow up with Dr. Burley Saver labs today as lab closed. Early November appropriate for labs. Coronary artery disease involving native coronary artery of native heart without angina pectoris She is allergic to statins.  Continue current medications.  Refilled aspirin.  No chest pain.  TOBACCO DEPENDENCE Discussed cessation, she is working on quitting. No quit date. Declined gums, medications today--she is focused on weight loss at this time as her biggest barrier to smoking cessation she reports is possible weight gain.  At follow up consider Rx Tdap   [name], MD  Family Medicine Teaching Service  [facility]

## 2022-12-21 NOTE — Assessment & Plan Note (Signed)
Due to DJD of back and OA--rollator today

## 2022-12-21 NOTE — Telephone Encounter (Signed)
Community message sent to [employer]. Will await response.   Jozlyn Schatz C January Bergthold, RN  

## 2022-12-21 NOTE — Assessment & Plan Note (Addendum)
With CAD/PAD discussed dietary changes and increased activity.  Rollator prescribed to help with her underlying limited mobility from her degenerative disc disease and peripheral arterial disease.  New prescription sent for [facility].  Personally called the pharmacy confirmed with in stock with a $0 co-pay and ready for pickup. Follow-up with 1 month for dose titration

## 2022-12-21 NOTE — Patient Instructions (Addendum)
It was wonderful to see you today.  Please bring ALL of your medications with you to every visit.   Today we talked about:   I sent your [phone] to [address] Your prescription is ready to pick up It is $0 copay  Follow up in 1 month for weight loss  I [facility]    I recommend you undergo a DEXA (bone density).   You can call to schedule an appointment by calling [phone].   Directions [address] [place], [place] [address]  Please let me know if you have questions. I will send you a letter or call you with results.     Please follow up in 3 months   Thank you for choosing [facility].   Please call [phone] with any questions about today's appointment.  Please be sure to schedule follow up at the front  desk before you leave today.   [name], MD  Family Medicine

## 2022-12-21 NOTE — Assessment & Plan Note (Addendum)
She is allergic to statins.  Continue current medications.  Refilled aspirin.  No chest pain.

## 2022-12-21 NOTE — Assessment & Plan Note (Addendum)
Has upcoming follow up with Dr. Burley Saver labs today as lab closed. Early November appropriate for labs.

## 2022-12-22 DIAGNOSIS — I739 Peripheral vascular disease, unspecified: Secondary | ICD-10-CM | POA: Diagnosis not present

## 2022-12-22 DIAGNOSIS — G8929 Other chronic pain: Secondary | ICD-10-CM | POA: Diagnosis not present

## 2022-12-23 ENCOUNTER — Other Ambulatory Visit (HOSPITAL_COMMUNITY): Payer: Self-pay

## 2022-12-28 ENCOUNTER — Ambulatory Visit: Payer: 59 | Admitting: Cardiology

## 2022-12-28 ENCOUNTER — Telehealth: Payer: Self-pay

## 2022-12-28 NOTE — Telephone Encounter (Signed)
Patient has been informed. Penni Bombard CMA

## 2022-12-28 NOTE — Telephone Encounter (Signed)
Patient calls nurse line regarding questions with Vitamin D supplement.   She reports having two different prescriptions for Vitamin D supplementation.   Vitamin D3 25 mg- has been taking this daily.   New prescription Vitamin D2 1.25 mg weekly.   Please advise how patient should be taking medication.   Veronda Prude, RN

## 2022-12-28 NOTE — Telephone Encounter (Signed)
Please advise she should be on weekly vitamin D (per Dr. Mercy Riding notes).  [name], MD  Family Medicine Teaching Service

## 2022-12-29 ENCOUNTER — Telehealth: Payer: Self-pay

## 2022-12-29 MED ORDER — METOPROLOL TARTRATE 100 MG PO TABS: 100.0000 mg | ORAL_TABLET | Freq: Two times a day (BID) | ORAL | 3 refills | Status: DC

## 2022-12-29 NOTE — Telephone Encounter (Signed)
Patient calls nurse line in regards to Carvedilol.  She reports headaches since starting the medication ~ 2 weeks ago. She reports it started as a dull ache and progressively worsened as she continued to take the medication.   She denies any vision changes, dizziness or SOB.   She reports as of today she is no longer taking the medication. She reports she was told to let Dr. Manson Passey know. She will not be informing Cardiology as she reports she does not want to see them again.   Advised will forward to PCP.

## 2022-12-29 NOTE — Telephone Encounter (Signed)
Called patient to discuss.  She reports a headache with carvedilol that is improved now that she has not taken the medicine.  She did not sleep all night.  No other neurologic symptoms no chest pain or dyspnea.  She is agreeable to restarting metoprolol at her previous dose.  Intolerance listed on allergy list carvedilol.  All questions answered    CMA Team-please call pharmacy and cancel carvedilol.  [name], MD  Family Medicine Teaching Service

## 2022-12-29 NOTE — Telephone Encounter (Signed)
Called to cancel, says pt already picked it up, but did take it off her list. Maree Erie, CMA

## 2022-12-30 ENCOUNTER — Ambulatory Visit: Payer: 59

## 2022-12-31 NOTE — Telephone Encounter (Signed)
Noted, if recurs should be seen

## 2022-12-31 NOTE — Telephone Encounter (Signed)
Patient calls nurse line in regards to headaches.   She reports "yesterday I felt awful, my head was pounding" however she reports today she feels much better.   She reports she has still not taken Carvedilol. She reports it has been almost 3 days without medication.   She reports she is unable to check her BP at home due to not having batteries for the machine. She reports she will be able to purchase some tomorrow.   She denies any vision changes, SOB, chest pains or dizziness.   Patient advised she should be evaluated sooner than apt with PCP on 11/15. She reports she has transportation issues and making "spur of the moment" apts is hard for her.   Patient advised to check her BP once she is able and to call us with numbers.   Strict precautions discussed with patient.

## 2023-01-04 DIAGNOSIS — N1832 Chronic kidney disease, stage 3b: Secondary | ICD-10-CM | POA: Diagnosis not present

## 2023-01-11 DIAGNOSIS — E872 Acidosis, unspecified: Secondary | ICD-10-CM | POA: Diagnosis not present

## 2023-01-11 DIAGNOSIS — Z720 Tobacco use: Secondary | ICD-10-CM | POA: Diagnosis not present

## 2023-01-11 DIAGNOSIS — E875 Hyperkalemia: Secondary | ICD-10-CM | POA: Diagnosis not present

## 2023-01-11 DIAGNOSIS — N2581 Secondary hyperparathyroidism of renal origin: Secondary | ICD-10-CM | POA: Diagnosis not present

## 2023-01-11 DIAGNOSIS — N281 Cyst of kidney, acquired: Secondary | ICD-10-CM | POA: Diagnosis not present

## 2023-01-11 DIAGNOSIS — E559 Vitamin D deficiency, unspecified: Secondary | ICD-10-CM | POA: Diagnosis not present

## 2023-01-11 DIAGNOSIS — I2510 Atherosclerotic heart disease of native coronary artery without angina pectoris: Secondary | ICD-10-CM | POA: Diagnosis not present

## 2023-01-11 DIAGNOSIS — I129 Hypertensive chronic kidney disease with stage 1 through stage 4 chronic kidney disease, or unspecified chronic kidney disease: Secondary | ICD-10-CM | POA: Diagnosis not present

## 2023-01-11 DIAGNOSIS — N1832 Chronic kidney disease, stage 3b: Secondary | ICD-10-CM | POA: Diagnosis not present

## 2023-01-15 ENCOUNTER — Other Ambulatory Visit (HOSPITAL_COMMUNITY): Payer: Self-pay

## 2023-01-15 ENCOUNTER — Ambulatory Visit (INDEPENDENT_AMBULATORY_CARE_PROVIDER_SITE_OTHER): Payer: 59 | Admitting: Family Medicine

## 2023-01-15 ENCOUNTER — Encounter: Payer: Self-pay | Admitting: Family Medicine

## 2023-01-15 VITALS — BP 128/61 | HR 59 | Ht 66.0 in | Wt 295.2 lb

## 2023-01-15 DIAGNOSIS — I10 Essential (primary) hypertension: Secondary | ICD-10-CM

## 2023-01-15 DIAGNOSIS — F17200 Nicotine dependence, unspecified, uncomplicated: Secondary | ICD-10-CM | POA: Diagnosis not present

## 2023-01-15 MED ORDER — SEMAGLUTIDE-WEIGHT MANAGEMENT 1 MG/0.5ML ~~LOC~~ SOAJ
1.0000 mg | SUBCUTANEOUS | 1 refills | Status: DC
  Filled 2023-01-15: qty 2, 28d supply, fill #0

## 2023-01-15 NOTE — Assessment & Plan Note (Signed)
Discussed cessation, supportive listening, pre contemplative at this time

## 2023-01-15 NOTE — Patient Instructions (Signed)
It was wonderful to see you today.  Please bring ALL of your medications with you to every visit.   Today we talked about: --I sent in a new prescription with your [phone]  -- I will message you about blood work and your potassium  --I will message Dr. Malen Gauze about the medication called LOSARTAN   Please follow up in 1 months   Thank you for choosing [name].   Please call [phone] with any questions about today's appointment.  Please be sure to schedule follow up at the front  desk before you leave today.   [name], MD  Family Medicine

## 2023-01-15 NOTE — Assessment & Plan Note (Signed)
At goal, continue current medications. Given recent hyperkalemia, will repeat BMP. Message Dr. Malen Gauze about results and angioedema. Continue current medications.

## 2023-01-15 NOTE — Progress Notes (Signed)
    SUBJECTIVE:   CHIEF COMPLAINT: HTN, weight HPI:   Tammy [name] is a 67 y.o.  with history notable for HTN, CKD, obesity, and prediabetes presenting for follow up.  She reports she is disappointed in weight loss. Feels with cutting back on smoking, she eats more. Continues to be active walking a lot. Eats 2 meals per day and focuses on reducing high K foods.   She reports compliance with her BP medications. She is worried as her K was high at [facility] (Dr. Malen Gauze). Has not taken losartan for months since seen in ED for angioedema. No NSAID or Mrs. Dash use.   She reports having a hard time cutting back on smoking. Has stressors at home and uses cigarettes for stress. No quit date at this time.   PERTINENT  PMH / PSH/Family/Social History :  HTN, CKD, HS, PAD on DAPT  OBJECTIVE:   BP 128/61   Pulse (!) 59   Ht 5\' 6"  (1.676 m)   Wt 295 lb 3.2 oz (133.9 kg)   SpO2 100%   BMI 47.65 kg/m   Today's weight:  Last Weight  Most recent update: 01/15/2023  8:41 AM    Weight  133.9 kg (295 lb 3.2 oz)            Review of prior weights: Filed Weights   01/15/23 0841  Weight: 295 lb 3.2 oz (133.9 kg)    RRR Lungs clear Abdomen soft No edema   ASSESSMENT/PLAN:   Assessment & Plan Essential hypertension At goal, continue current medications. Given recent hyperkalemia, will repeat BMP. Message Dr. Malen Gauze about results and angioedema. Continue current medications.  TOBACCO DEPENDENCE Discussed cessation, supportive listening, pre contemplative at this time Morbid obesity (HCC) Not yet achieved goal. Discussed increasing activity, dietary changes Increased Wegovy 1 mg weekly   HCM She will get DEXA in spring  Follow up 1 month for [facility]  [name], MD  Family Medicine Teaching Service  [facility]

## 2023-01-15 NOTE — Assessment & Plan Note (Signed)
Not yet achieved goal. Discussed increasing activity, dietary changes Increased Wegovy 1 mg weekly

## 2023-01-16 LAB — BASIC METABOLIC PANEL
BUN/Creatinine Ratio: 17 (ref 12–28)
BUN: 28 mg/dL — ABNORMAL HIGH (ref 8–27)
CO2: 22 mmol/L (ref 20–29)
Calcium: 9.8 mg/dL (ref 8.7–10.3)
Chloride: 100 mmol/L (ref 96–106)
Creatinine, Ser: 1.69 mg/dL — ABNORMAL HIGH (ref 0.57–1.00)
Glucose: 129 mg/dL — ABNORMAL HIGH (ref 70–99)
Potassium: 4.1 mmol/L (ref 3.5–5.2)
Sodium: 138 mmol/L (ref 134–144)
eGFR: 33 mL/min/{1.73_m2} — ABNORMAL LOW (ref >59)

## 2023-01-19 ENCOUNTER — Telehealth: Payer: Self-pay | Admitting: Family Medicine

## 2023-01-19 NOTE — Telephone Encounter (Signed)
Attempted to call patient. Reached voicemail, left generic voicemail to call back.  If she calls back please let her  know  Labs look overall good! Kidney function similar to prior  She should NOT take Losartan---I have messaged Dr. Malen Gauze Her potassium is fine--She can continue to eat potatoes as she was doing She can take her sodium bicarbonate once a day I suspect she will have questions--ask her a good time to call  [name], MD  [facility] Medicine Teaching Service

## 2023-01-21 ENCOUNTER — Telehealth: Payer: Self-pay | Admitting: Family Medicine

## 2023-01-21 NOTE — Telephone Encounter (Signed)
Called with results.  Discussed low K and letter with information

## 2023-01-27 ENCOUNTER — Telehealth: Payer: Self-pay | Admitting: Family Medicine

## 2023-01-27 ENCOUNTER — Other Ambulatory Visit (HOSPITAL_COMMUNITY): Payer: Self-pay

## 2023-01-27 MED ORDER — SEMAGLUTIDE-WEIGHT MANAGEMENT 1.7 MG/0.75ML ~~LOC~~ SOAJ
1.7000 mg | SUBCUTANEOUS | 0 refills | Status: DC
  Filled 2023-01-27: qty 3, 28d supply, fill #0

## 2023-01-27 NOTE — Telephone Encounter (Signed)
Called and discussed--increased dose to pharmacy She will call with weight and symptoms in [place] December. [name], MD  Family Medicine Teaching Service

## 2023-01-27 NOTE — Telephone Encounter (Signed)
Patient calling regarding her weight loss shot. Patient called to set up an appointment with PCP for December to get her shot, but Dr. Manson Passey does not have any availability in December. Patient would like to know what to do about getting her dosage for December.  Patient went ahead and scheduled for January

## 2023-02-02 ENCOUNTER — Other Ambulatory Visit: Payer: Self-pay

## 2023-02-02 ENCOUNTER — Other Ambulatory Visit (HOSPITAL_COMMUNITY): Payer: Self-pay

## 2023-03-11 NOTE — Progress Notes (Signed)
    SUBJECTIVE:   CHIEF COMPLAINT: weight loss discussion HPI:   Tammy [name] is a 68 y.o.  with history notable for HTN, CKD, tobacco use disorder, and obesity presenting for follow up. She had a good christmas and new year. Living with sister and trying to stay very active with cold, snowy weather.   Weight loss Patient desires to lose '20-30 pounds a month'. Discussed safe weight loss. Has lost 11 pounds in 2 months. Tolerates Wegovy . Has reduced portions considerably. Walking and doing chair exercises  HTN Taking medications as prescribed. No adverse effects. Continues to wonder about potassium    PERTINENT  PMH / PSH/Family/Social History :  Obesity CKD IIIB--follows with Dr. Jerrye    OBJECTIVE:   BP 138/78   Pulse 77   Ht 5' 6 (1.676 m)   Wt 284 lb 6.4 oz (129 kg)   SpO2 100%   BMI 45.90 kg/m   Today's weight:  Last Weight  Most recent update: 03/12/2023  8:38 AM    Weight  129 kg (284 lb 6.4 oz)            Review of prior weights: [employer]   03/12/23 0838  Weight: 284 lb 6.4 oz (129 kg)    RRR Lungs clear bilaterally   ASSESSMENT/PLAN:   Assessment & Plan Essential hypertension At goal Continue current medications  PAD (peripheral artery disease) (HCC) On ASA/Plavix   Coronary artery disease involving native coronary artery of native heart without angina pectoris Previously messaged Cardiology about identifying new female cardiologist--will place new referral  Morbid obesity (HCC) Congratulated on weight loss Discussed safe weight loss rate Increase Wegovy  to 2.4 mg weekly Follow up 1 month  TOBACCO DEPENDENCE Discussed cessations, options to help with quitting Congratulated on getting down to 1-2 cigarettes per day Continue to discuss    Suzann Daring, MD  Family Medicine Teaching Service  [facility]

## 2023-03-12 ENCOUNTER — Encounter: Payer: Self-pay | Admitting: Family Medicine

## 2023-03-12 ENCOUNTER — Other Ambulatory Visit (HOSPITAL_COMMUNITY): Payer: Self-pay

## 2023-03-12 ENCOUNTER — Ambulatory Visit (INDEPENDENT_AMBULATORY_CARE_PROVIDER_SITE_OTHER): Payer: 59 | Admitting: Family Medicine

## 2023-03-12 VITALS — BP 138/78 | HR 77 | Ht 66.0 in | Wt 284.4 lb

## 2023-03-12 DIAGNOSIS — I739 Peripheral vascular disease, unspecified: Secondary | ICD-10-CM | POA: Diagnosis not present

## 2023-03-12 DIAGNOSIS — I2510 Atherosclerotic heart disease of native coronary artery without angina pectoris: Secondary | ICD-10-CM | POA: Diagnosis not present

## 2023-03-12 DIAGNOSIS — F17200 Nicotine dependence, unspecified, uncomplicated: Secondary | ICD-10-CM | POA: Diagnosis not present

## 2023-03-12 DIAGNOSIS — I10 Essential (primary) hypertension: Secondary | ICD-10-CM | POA: Diagnosis not present

## 2023-03-12 MED ORDER — SEMAGLUTIDE-WEIGHT MANAGEMENT 2.4 MG/0.75ML ~~LOC~~ SOAJ
2.4000 mg | SUBCUTANEOUS | 3 refills | Status: DC
Start: 2023-03-12 — End: 2023-03-16
  Filled 2023-03-12: qty 3, 28d supply, fill #0

## 2023-03-12 MED ORDER — SEMAGLUTIDE-WEIGHT MANAGEMENT 2.4 MG/0.75ML ~~LOC~~ SOAJ
2.4000 mg | SUBCUTANEOUS | 3 refills | Status: DC
Start: 2023-03-12 — End: 2023-03-12

## 2023-03-12 NOTE — Assessment & Plan Note (Addendum)
 Previously messaged Cardiology about identifying new female cardiologist--will place new referral

## 2023-03-12 NOTE — Patient Instructions (Addendum)
 It was wonderful to see you today.  Please bring ALL of your medications with you to every visit.   Today we talked about:  The name of the other medication is Zepbound  I sent in your increased dose of Wegovy    We will check your potassium today   Please follow up in 1 months   Thank you for choosing [facility].   Please call [phone] with any questions about today's appointment.  Please be sure to schedule follow up at the front  desk before you leave today.   Suzann Daring, MD  Family Medicine

## 2023-03-12 NOTE — Addendum Note (Signed)
 Addended by: Jennette Bill on: 03/12/2023 11:13 AM   Modules accepted: Orders

## 2023-03-12 NOTE — Assessment & Plan Note (Signed)
 On ASA/Plavix.

## 2023-03-12 NOTE — Assessment & Plan Note (Signed)
 Congratulated on weight loss Discussed safe weight loss rate Increase Wegovy to 2.4 mg weekly Follow up 1 month

## 2023-03-12 NOTE — Assessment & Plan Note (Addendum)
 Discussed cessations, options to help with quitting Congratulated on getting down to 1-2 cigarettes per day Continue to discuss

## 2023-03-12 NOTE — Assessment & Plan Note (Signed)
 At goal. Continue current medications.

## 2023-03-15 ENCOUNTER — Other Ambulatory Visit (HOSPITAL_COMMUNITY): Payer: Self-pay

## 2023-03-15 ENCOUNTER — Telehealth: Payer: Self-pay

## 2023-03-15 DIAGNOSIS — I739 Peripheral vascular disease, unspecified: Secondary | ICD-10-CM

## 2023-03-15 DIAGNOSIS — I10 Essential (primary) hypertension: Secondary | ICD-10-CM

## 2023-03-15 NOTE — Telephone Encounter (Signed)
 Pharmacy Patient Advocate Encounter   Received notification from CoverMyMeds that prior authorization for WEGOVY  is required/requested.   The patient is insured through [facility] .   PA required; PA submitted to above mentioned insurance via CoverMyMeds Key/confirmation #/EOC [id number]. Status is pending

## 2023-03-16 ENCOUNTER — Other Ambulatory Visit (HOSPITAL_COMMUNITY): Payer: Self-pay

## 2023-03-16 ENCOUNTER — Encounter (HOSPITAL_COMMUNITY): Payer: Self-pay

## 2023-03-16 ENCOUNTER — Other Ambulatory Visit: Payer: Self-pay | Admitting: Family Medicine

## 2023-03-16 MED ORDER — WEGOVY 1.7 MG/0.75ML ~~LOC~~ SOAJ: 1.7000 mg | SUBCUTANEOUS | 0 refills | Status: DC

## 2023-03-16 NOTE — Addendum Note (Signed)
 Addended by: Caro Laroche on: 03/16/2023 02:39 PM   Modules accepted: Orders

## 2023-03-16 NOTE — Telephone Encounter (Signed)
 Done

## 2023-03-16 NOTE — Telephone Encounter (Signed)
 Patient calls nurse line reporting she was notified of Wegovy  denial.   PCP increased her dosage from 1.7mg  to 2.4mg  at last visit which promoted new authorization.  Patient would like to remain on 1.7mg  until we can appeal 2.4mg .  She reports she is due for next injection on Friday.   Will forward to PCP to send in Wegovy  1.7mg .

## 2023-03-17 ENCOUNTER — Other Ambulatory Visit: Payer: Self-pay | Admitting: Family Medicine

## 2023-03-17 NOTE — Telephone Encounter (Signed)
 Pharmacy Patient Advocate Encounter  Received notification from [employer] that Prior Authorization for WEGOVY  2.4MG  has been DENIED.  Full denial letter will be uploaded to the media tab. See denial reason below.  WEGOVYINJ2.4MG  is denied because it is not on your plan's Drug List (formulary).  Medication authorization requires the following:  (1) Your provider submits medical records (for example: chart notes) documenting established cardiovascular disease as evidenced by one of the following: Prior myocardial infarction, prior stroke (that is, ischemic or hemorrhagic stroke), peripheral arterial disease (that is, intermittent claudication with anklebrachial index less than 0.85, peripheral arterial revascularization procedure, or amputation due to atherosclerotic disease).  PA #/Case ID/Reference #: [id number]

## 2023-03-18 ENCOUNTER — Other Ambulatory Visit: Payer: 59

## 2023-03-18 ENCOUNTER — Other Ambulatory Visit (HOSPITAL_COMMUNITY): Payer: Self-pay

## 2023-03-18 ENCOUNTER — Telehealth: Payer: Self-pay | Admitting: *Deleted

## 2023-03-18 DIAGNOSIS — I10 Essential (primary) hypertension: Secondary | ICD-10-CM | POA: Diagnosis not present

## 2023-03-18 NOTE — Telephone Encounter (Signed)
Faxed 12/21/22 chart notes to [phone] [facility] medicare ([employer])

## 2023-03-18 NOTE — Telephone Encounter (Signed)
Not covered by insurance, needs PA are you able to help with this? Alaysia Lightle Bruna Potter, CMA

## 2023-03-18 NOTE — Telephone Encounter (Signed)
Found an OV from 12/21/22 that mentions her h/o CAD, PAD as well as obesity and need for wegovy. Maybe we can use this for appeal?

## 2023-03-18 NOTE — Telephone Encounter (Signed)
Dianna Limbo,   I'm covering for Dr. Manson Passey while she is out of the office. I saw the PA for this patient's wegovy was denied. I did find an office visit note from 12/21/22 that mentioned both her obesity and underlying CAD, PAD and need for starting wegovy. Can we submit that one for PA?   Thanks!

## 2023-03-19 LAB — BASIC METABOLIC PANEL
BUN/Creatinine Ratio: 12 (ref 12–28)
BUN: 18 mg/dL (ref 8–27)
CO2: 23 mmol/L (ref 20–29)
Calcium: 9.3 mg/dL (ref 8.7–10.3)
Chloride: 101 mmol/L (ref 96–106)
Creatinine, Ser: 1.46 mg/dL — ABNORMAL HIGH (ref 0.57–1.00)
Glucose: 101 mg/dL — ABNORMAL HIGH (ref 70–99)
Potassium: 4.2 mmol/L (ref 3.5–5.2)
Sodium: 141 mmol/L (ref 134–144)
eGFR: 39 mL/min/{1.73_m2} — ABNORMAL LOW (ref >59)

## 2023-03-24 ENCOUNTER — Telehealth: Payer: Self-pay

## 2023-03-24 NOTE — Telephone Encounter (Signed)
From Judene Companion 03/24/23 telephone note:   Erie Noe with [facility] calls nurse line in regards to [facility] Appeal.    Clinical questions answered over the phone.    Chart documentation with diagnosis listed were faxed to [facility] [phone].   Case# [id number]   We should have a determination in 48 hours.

## 2023-03-24 NOTE — Telephone Encounter (Signed)
Tammy [name] with [facility] calls nurse line in regards to [employer].   Clinical questions answered over the phone.   Chart documentation with diagnosis listed were faxed to [facility] [phone].  Case# [id number]  We should have a determination in 48 hours.

## 2023-03-24 NOTE — Telephone Encounter (Signed)
See other phone note

## 2023-04-01 ENCOUNTER — Other Ambulatory Visit (HOSPITAL_COMMUNITY): Payer: Self-pay

## 2023-04-01 ENCOUNTER — Other Ambulatory Visit: Payer: Self-pay

## 2023-04-01 NOTE — Telephone Encounter (Signed)
Can we try to run through Medicaid?  [name], MD  Family Medicine Teaching Service

## 2023-04-05 ENCOUNTER — Ambulatory Visit: Payer: 59 | Admitting: Cardiology

## 2023-04-05 ENCOUNTER — Telehealth: Payer: Self-pay

## 2023-04-05 MED ORDER — WEGOVY 2.4 MG/0.75ML ~~LOC~~ SOAJ
2.4000 mg | SUBCUTANEOUS | 3 refills | Status: DC
  Filled 2023-04-05: qty 3, 28d supply, fill #0

## 2023-04-05 NOTE — Telephone Encounter (Signed)
Patient calls nurse line in regards to [facility].   She reports she received the letter the medication was approved for the remainder of the year.   She reports she wants the medication transferred to [facility].   She would like to start at 2.4mg , however she has not been on [facility] for ~ 3 weeks.  Advised will forward to PCP for advisement.   Please send preferred dose to [facility].   Will cancel [facility] prescription.

## 2023-04-05 NOTE — Telephone Encounter (Signed)
Sent in 2.4 mg weekly dose to preferred pharmacy [facility]) Nursing- please call patient and let her know she should start injections as soon as she picks up medication to limit gap in therapy  Please ask her to call immediately if she feels more nauseated than usual with injection.

## 2023-04-06 ENCOUNTER — Other Ambulatory Visit: Payer: Self-pay

## 2023-04-06 ENCOUNTER — Other Ambulatory Visit (HOSPITAL_COMMUNITY): Payer: Self-pay

## 2023-04-06 NOTE — Telephone Encounter (Signed)
 Patient informed. Penni Bombard CMA

## 2023-04-29 NOTE — Progress Notes (Unsigned)
    SUBJECTIVE:   CHIEF COMPLAINT: HTN, weight HPI:   Tammy [name] is a 68 y.o.  with history notable for HTN, CKD, and obesity presenting for  follow up.  Diet/Lifestyle changes ***       PERTINENT  PMH / PSH/Family/Social History : ***  OBJECTIVE:   There were no vitals taken for this visit.  Today's weight:  Review of prior weights: There were no vitals filed for this visit.  ***  ASSESSMENT/PLAN:   Assessment & Plan    Tammy Starr, MD  Family Medicine Teaching Service  [facility]

## 2023-04-30 ENCOUNTER — Emergency Department (HOSPITAL_COMMUNITY)
Admission: EM | Admit: 2023-04-30 | Discharge: 2023-04-30 | Disposition: A | Discharge status: Home / Self Care | Payer: 59 | Attending: Emergency Medicine | Admitting: Emergency Medicine

## 2023-04-30 ENCOUNTER — Ambulatory Visit: Payer: 59 | Admitting: Family Medicine

## 2023-04-30 ENCOUNTER — Encounter (HOSPITAL_COMMUNITY): Payer: Self-pay | Admitting: Emergency Medicine

## 2023-04-30 ENCOUNTER — Other Ambulatory Visit: Payer: Self-pay

## 2023-04-30 ENCOUNTER — Encounter: Payer: Self-pay | Admitting: Family Medicine

## 2023-04-30 ENCOUNTER — Emergency Department (HOSPITAL_COMMUNITY): Payer: 59

## 2023-04-30 VITALS — BP 145/87 | HR 78 | Ht 66.0 in | Wt 266.8 lb

## 2023-04-30 DIAGNOSIS — I10 Essential (primary) hypertension: Secondary | ICD-10-CM | POA: Diagnosis not present

## 2023-04-30 DIAGNOSIS — Q676 Pectus excavatum: Secondary | ICD-10-CM | POA: Diagnosis not present

## 2023-04-30 DIAGNOSIS — I2510 Atherosclerotic heart disease of native coronary artery without angina pectoris: Secondary | ICD-10-CM | POA: Diagnosis not present

## 2023-04-30 DIAGNOSIS — R0789 Other chest pain: Secondary | ICD-10-CM | POA: Diagnosis not present

## 2023-04-30 DIAGNOSIS — I739 Peripheral vascular disease, unspecified: Secondary | ICD-10-CM

## 2023-04-30 DIAGNOSIS — R109 Unspecified abdominal pain: Secondary | ICD-10-CM | POA: Diagnosis not present

## 2023-04-30 DIAGNOSIS — R1084 Generalized abdominal pain: Secondary | ICD-10-CM | POA: Insufficient documentation

## 2023-04-30 DIAGNOSIS — N281 Cyst of kidney, acquired: Secondary | ICD-10-CM | POA: Diagnosis not present

## 2023-04-30 DIAGNOSIS — R079 Chest pain, unspecified: Secondary | ICD-10-CM

## 2023-04-30 DIAGNOSIS — R1110 Vomiting, unspecified: Secondary | ICD-10-CM | POA: Diagnosis not present

## 2023-04-30 DIAGNOSIS — R42 Dizziness and giddiness: Secondary | ICD-10-CM

## 2023-04-30 DIAGNOSIS — R404 Transient alteration of awareness: Secondary | ICD-10-CM | POA: Diagnosis not present

## 2023-04-30 DIAGNOSIS — I499 Cardiac arrhythmia, unspecified: Secondary | ICD-10-CM | POA: Diagnosis not present

## 2023-04-30 DIAGNOSIS — R112 Nausea with vomiting, unspecified: Secondary | ICD-10-CM | POA: Diagnosis not present

## 2023-04-30 LAB — BASIC METABOLIC PANEL
Anion gap: 14 (ref 5–15)
BUN: 20 mg/dL (ref 8–23)
CO2: 20 mmol/L — ABNORMAL LOW (ref 22–32)
Calcium: 10 mg/dL (ref 8.9–10.3)
Chloride: 98 mmol/L (ref 98–111)
Creatinine, Ser: 1.83 mg/dL — ABNORMAL HIGH (ref 0.44–1.00)
GFR, Estimated: 30 mL/min — ABNORMAL LOW (ref >60)
Glucose, Bld: 118 mg/dL — ABNORMAL HIGH (ref 70–99)
Potassium: 3.5 mmol/L (ref 3.5–5.1)
Sodium: 132 mmol/L — ABNORMAL LOW (ref 135–145)

## 2023-04-30 LAB — HEPATIC FUNCTION PANEL
ALT: 17 U/L (ref 0–44)
AST: 30 U/L (ref 15–41)
Albumin: 3.9 g/dL (ref 3.5–5.0)
Alkaline Phosphatase: 60 U/L (ref 38–126)
Bilirubin, Direct: 0.3 mg/dL — ABNORMAL HIGH (ref 0.0–0.2)
Indirect Bilirubin: 0.9 mg/dL (ref 0.3–0.9)
Total Bilirubin: 1.2 mg/dL (ref 0.0–1.2)
Total Protein: 7.6 g/dL (ref 6.5–8.1)

## 2023-04-30 LAB — TROPONIN I (HIGH SENSITIVITY)
Troponin I (High Sensitivity): 8 ng/L (ref <18)
Troponin I (High Sensitivity): 7 ng/L (ref <18)

## 2023-04-30 LAB — CBC
HCT: 40.7 % (ref 36.0–46.0)
Hemoglobin: 13.7 g/dL (ref 12.0–15.0)
MCH: 27 pg (ref 26.0–34.0)
MCHC: 33.7 g/dL (ref 30.0–36.0)
MCV: 80.3 fL (ref 80.0–100.0)
Platelets: 242 10*3/uL (ref 150–400)
RBC: 5.07 MIL/uL (ref 3.87–5.11)
RDW: 12.5 % (ref 11.5–15.5)
WBC: 6 10*3/uL (ref 4.0–10.5)
nRBC: 0 % (ref 0.0–0.2)

## 2023-04-30 LAB — LIPASE, BLOOD: Lipase: 33 U/L (ref 11–51)

## 2023-04-30 LAB — MAGNESIUM: Magnesium: 1.8 mg/dL (ref 1.7–2.4)

## 2023-04-30 MED ORDER — IOHEXOL 350 MG/ML SOLN
75.0000 mL | Freq: Once | INTRAVENOUS | Status: AC | PRN
  Administered 2023-04-30: 75 mL via INTRAVENOUS

## 2023-04-30 MED ORDER — ONDANSETRON HCL 4 MG/2ML IJ SOLN
4.0000 mg | Freq: Once | INTRAMUSCULAR | Status: AC
  Administered 2023-04-30: 4 mg via INTRAVENOUS
  Filled 2023-04-30: qty 2

## 2023-04-30 MED ORDER — LACTATED RINGERS IV BOLUS
1000.0000 mL | Freq: Once | INTRAVENOUS | Status: AC
  Administered 2023-04-30: 1000 mL via INTRAVENOUS

## 2023-04-30 NOTE — ED Notes (Signed)
 Patient transported to CT

## 2023-04-30 NOTE — ED Notes (Signed)
 Patient transported to X-ray

## 2023-04-30 NOTE — ED Provider Triage Note (Signed)
 Emergency Medicine Provider Triage Evaluation Note  Tammy [name] , a 68 y.o. female  was evaluated in triage.  Pt complains of chest pain 2 days ago that was tight localized to L breast. Relieved with nitroglycerin and tylenol. Reports frequent vomiting for last week 2/2 to wegovy shot. Denies current chest pain  Review of Systems  Positive: N/v, chest pain Negative: sob  Physical Exam  BP (!) 159/112   Pulse 83   Temp 98.6 F (37 C) (Oral)   Resp 20   SpO2 99%  Gen:   Awake, no distress   Resp:  Normal effort  MSK:   Moves extremities without difficulty  Other:    Medical Decision Making  Medically screening exam initiated at 12:23 PM.  Appropriate orders placed.  Tammy [name] was informed that the remainder of the evaluation will be completed by another provider, this initial triage assessment does not replace that evaluation, and the importance of remaining in the ED until their evaluation is complete.     Pete Pelt, [place] 04/30/23 1224

## 2023-04-30 NOTE — Patient Instructions (Signed)
 I recommend being seen in the ED  We will have [facility] Take you   We will give you medications for your nausea after your evaluation in the Emergency Department   Patient being send to ED for intractable nausea/vomiting/dizziness from GLP-1 in setting of CKD Had Chest pain last week resolved by nitroglycerin   Family medicine patient if admitted

## 2023-04-30 NOTE — ED Provider Notes (Signed)
 [facility] AT [facility] Provider Note   CSN: [id number] Arrival date & time: 04/30/23  1100     History Chief Complaint  Patient presents with   Chest Pain    HPI Tammy [name] is a 68 y.o. female presenting for chief complaint of abdominal pain. MSH: -HTN/HLD/CAD -NV AP since increasing dose of WEGOVY -Very poor PO  Patient's recorded medical, surgical, social, medication list and allergies were reviewed in the Snapshot window as part of the initial history.   Review of Systems   Review of Systems  Constitutional:  Negative for chills and fever.  HENT:  Negative for ear pain and sore throat.   Eyes:  Negative for pain and visual disturbance.  Respiratory:  Negative for cough and shortness of breath.   Cardiovascular:  Negative for chest pain and palpitations.  Gastrointestinal:  Positive for abdominal distention and abdominal pain. Negative for vomiting.  Genitourinary:  Negative for dysuria and hematuria.  Musculoskeletal:  Negative for arthralgias and back pain.  Skin:  Negative for color change and rash.  Neurological:  Negative for seizures and syncope.  All other systems reviewed and are negative.   Physical Exam Updated Vital Signs BP (!) 155/91 (BP Location: Right Arm)   Pulse 82   Temp 98.2 F (36.8 C) (Oral)   Resp 18   SpO2 100%  Physical Exam Vitals and nursing note reviewed.  Constitutional:      General: She is not in acute distress.    Appearance: She is well-developed.  HENT:     Head: Normocephalic and atraumatic.  Eyes:     Conjunctiva/sclera: Conjunctivae normal.  Cardiovascular:     Rate and Rhythm: Normal rate and regular rhythm.     Heart sounds: No murmur heard. Pulmonary:     Effort: Pulmonary effort is normal. No respiratory distress.     Breath sounds: Normal breath sounds.  Chest:     Chest wall: No tenderness.  Abdominal:     General: There is no distension.     Palpations: Abdomen is  soft.     Tenderness: There is abdominal tenderness. There is no right CVA tenderness, left CVA tenderness or guarding.  Musculoskeletal:        General: No swelling or tenderness. Normal range of motion.     Cervical back: Neck supple.  Skin:    General: Skin is warm and dry.  Neurological:     General: No focal deficit present.     Mental Status: She is alert and oriented to person, place, and time. Mental status is at baseline.     Cranial Nerves: No cranial nerve deficit.      ED Course/ Medical Decision Making/ A&P    Procedures Procedures   Medications Ordered in ED Medications  iohexol (OMNIPAQUE) 350 MG/ML injection 75 mL (75 mLs Intravenous Contrast Given 04/30/23 1624)  lactated ringers bolus 1,000 mL (1,000 mLs Intravenous New Bag/Given 04/30/23 1725)  ondansetron (ZOFRAN) injection 4 mg (4 mg Intravenous Given 04/30/23 1718)    Medical Decision Making:   Tammy [name] is a 68 y.o. female who presented to the ED today with abdominal pain, detailed above.    Patient placed on continuous vitals and telemetry monitoring while in ED which was reviewed periodically.  Complete initial physical exam performed, notably the patient  was HDS in NAD.     Reviewed and confirmed nursing documentation for past medical history, family history, social history.  Initial Assessment:   With the patient's presentation of abdominal pain, most likely diagnosis is nonspecific etiology including peptic ulcer disease versus medication side effect. Other diagnoses were considered including (but not limited to) gastroenteritis, colitis, small bowel obstruction, appendicitis, cholecystitis, pancreatitis, nephrolithiasis. These are considered less likely due to history of present illness and physical exam findings.   This is most consistent with an acute life/limb threatening illness complicated by underlying chronic conditions.   Initial Plan:  CBC/CMP to evaluate for underlying  infectious/metabolic etiology for patient's abdominal pain  Lipase to evaluate for pancreatitis  EKG to evaluate for cardiac source of pain  CTAB/Pelvis with contrast to evaluate for structural/surgical etiology of patients' severe abdominal pain.  Considered urinalysis and repeat physical assessment to evaluate for UTI/Pyelonpehritis however clinically inconsistent Empiric management of symptoms with escalating pain control and antiemetics as needed.   Initial Study Results:   Laboratory  All laboratory results reviewed without evidence of clinically relevant pathology.    EKG EKG was reviewed independently. Rate, rhythm, axis, intervals all examined and without medically relevant abnormality. ST segments without concerns for elevations.    Radiology All images reviewed independently. Agree with radiology report at this time.   CT ABDOMEN PELVIS W CONTRAST Result Date: 04/30/2023 CLINICAL DATA:  Abdomen pain EXAM: CT ABDOMEN AND PELVIS WITH CONTRAST TECHNIQUE: Multidetector CT imaging of the abdomen and pelvis was performed using the standard protocol following bolus administration of intravenous contrast. RADIATION DOSE REDUCTION: This exam was performed according to the departmental dose-optimization program which includes automated exposure control, adjustment of the mA and/or kV according to patient size and/or use of iterative reconstruction technique. CONTRAST:  75mL OMNIPAQUE IOHEXOL 350 MG/ML SOLN COMPARISON:  None Available. FINDINGS: Lower chest: Lung bases demonstrate no acute airspace disease. Coronary vascular calcification Hepatobiliary: No focal liver abnormality is seen. No gallstones, gallbladder wall thickening, or biliary dilatation. Pancreas: Unremarkable. No pancreatic ductal dilatation or surrounding inflammatory changes. Spleen: Normal in size without focal abnormality. Adrenals/Urinary Tract: Adrenal glands are normal. Kidneys show no hydronephrosis. Cyst lower pole left  kidney, no imaging follow-up is recommended. Bladder is slightly thick walled. Stomach/Bowel: Stomach is within normal limits. Appendix appears normal. No evidence of bowel wall thickening, distention, or inflammatory changes. Vascular/Lymphatic: Aortic atherosclerosis. No enlarged abdominal or pelvic lymph nodes. Reproductive: No adnexal mass. 2.6 cm exophytic mass off the right uterus consistent with fibroid. Other: Negative for pelvic effusion or free air Musculoskeletal: No acute or suspicious osseous abnormality. Degenerative changes. IMPRESSION: 1. No CT evidence for acute intra-abdominal or pelvic abnormality. 2. Slightly thick-walled appearance of urinary bladder, could be correlated with urinalysis if deemed appropriate 3. Uterine fibroid Electronically Signed   By: Jasmine Pang M.D.   On: 04/30/2023 16:45   DG Chest 2 View Result Date: 04/30/2023 CLINICAL DATA:  Chest pain and vomiting. EXAM: CHEST - 2 VIEW COMPARISON:  06/25/2022 FINDINGS: A mild pectus excavatum deformity. Midline trachea. Normal heart size and mediastinal contours. No pleural effusion or pneumothorax. Clear lungs. IMPRESSION: No active cardiopulmonary disease. Electronically Signed   By: Jeronimo Greaves M.D.   On: 04/30/2023 13:12   Final Reassessment and Plan:   On reassessment patient is resting comfortably.  Vital signs normal.  Patient tolerating p.o. intake.  Improved after Zofran.  States she has access to the medication at home will use.  She will follow-up with PCP in the outpatient setting. Disposition:  I have considered need for hospitalization, however, considering all of the above, I believe this  patient is stable for discharge at this time.  Patient/family educated about specific return precautions for given chief complaint and symptoms.  Patient/family educated about follow-up with PCP.     Patient/family expressed understanding of return precautions and need for follow-up. Patient spoken to regarding all  imaging and laboratory results and appropriate follow up for these results. All education provided in verbal form with additional information in written form. Time was allowed for answering of patient questions. Patient discharged.    Emergency Department Medication Summary:   Medications  iohexol (OMNIPAQUE) 350 MG/ML injection 75 mL (75 mLs Intravenous Contrast Given 04/30/23 1624)  lactated ringers bolus 1,000 mL (1,000 mLs Intravenous New Bag/Given 04/30/23 1725)  ondansetron (ZOFRAN) injection 4 mg (4 mg Intravenous Given 04/30/23 1718)    Clinical Impression:  1. Generalized abdominal pain      Discharge   Final Clinical Impression(s) / ED Diagnoses Final diagnoses:  Generalized abdominal pain    Rx / DC Orders ED Discharge Orders     None         Glyn Ade, MD 04/30/23 [phone]

## 2023-04-30 NOTE — ED Triage Notes (Addendum)
 Presents via EMS from PCP for N/V  and generalized abd pain (1/10) x3 weeks, CP one day last week (took nitroglycerin at that time which improved pain,no longer present) She is on ozempic EMS: afib (h/o same, plavix), MI, 180/110, 73bpm, 97%RA,

## 2023-05-03 ENCOUNTER — Encounter: Payer: Self-pay | Admitting: Family Medicine

## 2023-05-03 ENCOUNTER — Ambulatory Visit: Admitting: Family Medicine

## 2023-05-03 ENCOUNTER — Telehealth: Payer: Self-pay | Admitting: Family Medicine

## 2023-05-03 DIAGNOSIS — I739 Peripheral vascular disease, unspecified: Secondary | ICD-10-CM

## 2023-05-03 DIAGNOSIS — N1832 Chronic kidney disease, stage 3b: Secondary | ICD-10-CM | POA: Diagnosis not present

## 2023-05-03 DIAGNOSIS — E871 Hypo-osmolality and hyponatremia: Secondary | ICD-10-CM | POA: Diagnosis not present

## 2023-05-03 MED ORDER — SODIUM BICARBONATE 650 MG PO TABS: 650.0000 mg | ORAL_TABLET | Freq: Two times a day (BID) | ORAL | 3 refills | Status: DC

## 2023-05-03 MED ORDER — HYDRALAZINE HCL 50 MG PO TABS: 50.0000 mg | ORAL_TABLET | Freq: Two times a day (BID) | ORAL | 2 refills | Status: AC

## 2023-05-03 MED ORDER — ASPIRIN 81 MG PO TBEC: 81.0000 mg | DELAYED_RELEASE_TABLET | Freq: Every day | ORAL | 3 refills | Status: AC

## 2023-05-03 MED ORDER — CLOPIDOGREL BISULFATE 75 MG PO TABS: 75.0000 mg | ORAL_TABLET | Freq: Every day | ORAL | 3 refills | Status: AC

## 2023-05-03 MED ORDER — AMLODIPINE BESYLATE 10 MG PO TABS: 10.0000 mg | ORAL_TABLET | Freq: Every day | ORAL | 3 refills | Status: AC

## 2023-05-03 MED ORDER — VITAMIN D (ERGOCALCIFEROL) 1.25 MG (50000 UNIT) PO CAPS: 50000.0000 [IU] | ORAL_CAPSULE | ORAL | 3 refills | Status: DC

## 2023-05-03 NOTE — Assessment & Plan Note (Signed)
Refilled Clopidogrel. 

## 2023-05-03 NOTE — Patient Instructions (Addendum)
 It was wonderful to Talk to  you today.  Please bring ALL of your medications with you to every visit.   NO LASIX ON WEDNESDAY   I recommend you undergo a DEXA bone density.   You can call to schedule an appointment by calling [phone].   Directions [address] [place], [place] [address]  Please let me know if you have questions. I will send you a letter or call you with results.    Thank you for choosing [facility].   Please call [phone] with any questions about today's appointment.  Please be sure to schedule follow up at the front  desk before you leave today.   [name], MD  Family Medicine

## 2023-05-03 NOTE — Telephone Encounter (Signed)
 Please call pharmacy and cancel losartan.  [name], MD  Family Medicine Teaching Service

## 2023-05-03 NOTE — Progress Notes (Signed)
 [facility] Telemedicine Visit  Patient consented to have virtual visit and was identified by name and date of birth. Method of visit: Telephone  Encounter participants: Patient: Tammy [name] - located at home Provider: Westley Chandler - located at office Others (if applicable): none  Chief Complaint: ED follow up  HPI:  Tammy [name] is a 68 yo with CKD IIIb, HTN, and PAD presenting for ED follow up.   GLP-1 Reaction (most likely)  Reports appetite is improving. Urinating well. No more vomiting. Has not had BM in 1.5 days but no abdominal pain. VERY worried about gaining weight.  Hyponatremia No confusion or falls. Did take lasix this AM.   ROS: per HPI  Pertinent PMHx: CKD, HTN, CAD and PAD   Exam: Speaking in full sentences   Assessment/Plan: Assessment & Plan Hyponatremia Sodium low due to hypovolemia Hold Lasix Wednesday Repeat on Thursday  PAD (peripheral artery disease) (HCC) Refilled Clopidogrel  Morbid obesity (HCC) Discussed at length--perhaps can try low dose Mounjaro in May  Stage 3b chronic kidney disease (HCC) Slight bump in creatinine Repeat this week,  ordered as future        Time spent during visit with patient: 11 minutes

## 2023-05-03 NOTE — Assessment & Plan Note (Signed)
 Discussed at length--perhaps can try low dose Mounjaro in May

## 2023-05-03 NOTE — Assessment & Plan Note (Signed)
 Slight bump in creatinine Repeat this week,  ordered as future

## 2023-05-06 ENCOUNTER — Other Ambulatory Visit

## 2023-05-06 DIAGNOSIS — E871 Hypo-osmolality and hyponatremia: Secondary | ICD-10-CM

## 2023-05-07 LAB — BASIC METABOLIC PANEL
BUN/Creatinine Ratio: 10 — ABNORMAL LOW (ref 12–28)
BUN: 17 mg/dL (ref 8–27)
CO2: 22 mmol/L (ref 20–29)
Calcium: 9.5 mg/dL (ref 8.7–10.3)
Chloride: 99 mmol/L (ref 96–106)
Creatinine, Ser: 1.62 mg/dL — ABNORMAL HIGH (ref 0.57–1.00)
Glucose: 134 mg/dL — ABNORMAL HIGH (ref 70–99)
Potassium: 3.9 mmol/L (ref 3.5–5.2)
Sodium: 137 mmol/L (ref 134–144)
eGFR: 35 mL/min/{1.73_m2} — ABNORMAL LOW (ref >59)

## 2023-05-11 ENCOUNTER — Telehealth: Payer: Self-pay | Admitting: Family Medicine

## 2023-05-11 NOTE — Telephone Encounter (Signed)
 Yes it was. Penni Bombard CMA

## 2023-05-11 NOTE — Telephone Encounter (Signed)
 Attempted to call patient. Reached voicemail, left generic voicemail to call back.   If she calls back - Please continue current medications - her kidneys look similar to prior and overall better than when she was in the ED--good news  [name], MD  [facility] Medicine Teaching Service

## 2023-05-17 NOTE — Telephone Encounter (Signed)
 Patient returns call to office.   Spoke with Dr. Manson Passey regarding Lasix. Advised that patient should restart Lasix.   Provided patient with update.   Patient appreciative.   Veronda Prude, RN

## 2023-05-27 NOTE — Progress Notes (Unsigned)
    SUBJECTIVE:   CHIEF COMPLAINT: BP check and weight gain  HPI:   Tammy [name] is a 68 y.o.  with history notable for HTN, CKD, and obesity  presenting for follow up.  Weight loss Patient currently on *** weekly at  *** mg.  They are tolerating this well  Current weight :   Weight loss trend: There were no vitals filed for this visit. The patient has been making the following dietary changes ***.  The patient has increasing physical activity through the following: *** The patient denies a personal history of eating disorder, gastroparesis  or pancreatitis. No family history of medullary thyroid cancer. No symptoms of gallstones.    Tobacco use ***  Medications ***    PERTINENT  PMH / PSH/Family/Social History : ***  OBJECTIVE:   There were no vitals taken for this visit.  Today's weight:  Review of prior weights: There were no vitals filed for this visit.  ***  ASSESSMENT/PLAN:   Assessment & Plan    [name], MD  Family Medicine Teaching Service  [facility]

## 2023-05-28 ENCOUNTER — Ambulatory Visit: Payer: 59 | Admitting: Cardiology

## 2023-05-28 ENCOUNTER — Ambulatory Visit (INDEPENDENT_AMBULATORY_CARE_PROVIDER_SITE_OTHER): Payer: 59 | Admitting: Family Medicine

## 2023-05-28 ENCOUNTER — Encounter: Payer: Self-pay | Admitting: Family Medicine

## 2023-05-28 ENCOUNTER — Other Ambulatory Visit (HOSPITAL_COMMUNITY): Payer: Self-pay

## 2023-05-28 VITALS — BP 132/68 | HR 60 | Wt 267.0 lb

## 2023-05-28 DIAGNOSIS — I10 Essential (primary) hypertension: Secondary | ICD-10-CM | POA: Diagnosis not present

## 2023-05-28 DIAGNOSIS — F17200 Nicotine dependence, unspecified, uncomplicated: Secondary | ICD-10-CM

## 2023-05-28 DIAGNOSIS — R17 Unspecified jaundice: Secondary | ICD-10-CM | POA: Diagnosis not present

## 2023-05-28 DIAGNOSIS — E782 Mixed hyperlipidemia: Secondary | ICD-10-CM | POA: Diagnosis not present

## 2023-05-28 DIAGNOSIS — N1832 Chronic kidney disease, stage 3b: Secondary | ICD-10-CM

## 2023-05-28 MED ORDER — SODIUM BICARBONATE 650 MG PO TABS: 650.0000 mg | ORAL_TABLET | Freq: Two times a day (BID) | ORAL | 3 refills | Status: AC

## 2023-05-28 MED ORDER — WEGOVY 0.25 MG/0.5ML ~~LOC~~ SOAJ
0.2500 mg | SUBCUTANEOUS | 0 refills | Status: DC
  Filled 2023-05-28: qty 2, 28d supply, fill #0

## 2023-05-28 MED ORDER — NICOTINE 14 MG/24HR TD PT24
14.0000 mg | MEDICATED_PATCH | Freq: Every day | TRANSDERMAL | 1 refills | Status: DC
Start: 2023-05-28 — End: 2023-06-01
  Filled 2023-05-28: qty 28, 28d supply, fill #0

## 2023-05-28 NOTE — Assessment & Plan Note (Signed)
 Congratulated on cutting down  Rx patches

## 2023-05-28 NOTE — Patient Instructions (Signed)
 It was wonderful to see you today.  Please bring ALL of your medications with you to every visit.   Today we talked about:  --[place] work with your medications  -- I sent in Sodium Bicarbonate  --I sent in Wegovy--you will be called when approved  -- I sent in nicotine patches to use   Please follow up in 1 month for weight and Wegovy   Thank you for choosing [facility].   Please call [phone] with any questions about today's appointment.  Please be sure to schedule follow up at the front  desk before you leave today.   [name], MD  Family Medicine

## 2023-05-28 NOTE — Assessment & Plan Note (Signed)
 Discussed dietary changes, increasing activity, medication options  She would like to restart Wegovy at 0.25 mg  Rx to pharmacy

## 2023-05-28 NOTE — Assessment & Plan Note (Signed)
Refilled sodium bicarbonate.

## 2023-05-28 NOTE — Assessment & Plan Note (Signed)
 At goal Repeat BMP today as restarted Lasix

## 2023-05-29 LAB — BASIC METABOLIC PANEL WITH GFR
BUN/Creatinine Ratio: 10 — ABNORMAL LOW (ref 12–28)
BUN: 14 mg/dL (ref 8–27)
CO2: 24 mmol/L (ref 20–29)
Calcium: 9.9 mg/dL (ref 8.7–10.3)
Chloride: 99 mmol/L (ref 96–106)
Creatinine, Ser: 1.38 mg/dL — ABNORMAL HIGH (ref 0.57–1.00)
Glucose: 132 mg/dL — ABNORMAL HIGH (ref 70–99)
Potassium: 4.2 mmol/L (ref 3.5–5.2)
Sodium: 140 mmol/L (ref 134–144)
eGFR: 42 mL/min/{1.73_m2} — ABNORMAL LOW (ref >59)

## 2023-05-29 LAB — HEPATIC FUNCTION PANEL
ALT: 9 IU/L (ref 0–32)
AST: 17 IU/L (ref 0–40)
Albumin: 4.3 g/dL (ref 3.9–4.9)
Alkaline Phosphatase: 94 IU/L (ref 44–121)
Bilirubin Total: 0.5 mg/dL (ref 0.0–1.2)
Bilirubin, Direct: 0.19 mg/dL (ref 0.00–0.40)
Total Protein: 7.3 g/dL (ref 6.0–8.5)

## 2023-05-30 NOTE — Progress Notes (Unsigned)
 Cardiology Office Note    Date:  06/01/2023  ID:  Tammy [name], DOB 21-Jul-1955, MRN [id number] PCP:  Westley Chandler, MD  Cardiologist:  None  Electrophysiologist:  None   Chief Complaint: Follow up for CAD   History of Present Illness: .    Tammy [name] is a 68 y.o. female with visit-pertinent history of hypertension, hyperlipidemia, PAD, CAD s/p NSTEMI that is being medically managed, obesity and tobacco abuse.  In 07/2015 patient was admitted with chest pain and elevated troponin.  She underwent LHC which showed mild to moderate nonobstructive disease and was managed medically.  When seen in clinic in 08/2021 she reported having stable claudication symptoms.  Otherwise stable from a CV standpoint.  TTE obtained due to concern for PHTN showed LVEF 55% with inferior hypokinesis, normal RV, no significant valve disease.  Follow-up Myoview with normal perfusion.  Patient has previously been followed by Dr. Shari Prows, established with Dr. Servando Salina on 12/09/2022.  At office visit patient was doing well from a cardiac standpoint, noted blood pressure was high today as she had not yet taken her medications.  Patient was started on carvedilol, metoprolol was discontinued.  On 04/30/2023 patient presented to the ED with abdominal pain.  High sensitive troponin was negative x 2.  Patient started tolerating p.o. intake and improved after Zofran.  Felt likely to be a GLP-1 reaction.  Today she presents for follow-up.  She reports that she has been doing well. She had problems with [facility] with abdominal pain at higher doses, is planning to restart at lower doses. She denies chest pain or shorntess of breath.  She denies any lower extremity edema, orthopnea or PND, denies palpitations, presyncope or syncope.  She reports since stopping Wegovy she has not had any further abdominal problems, is closely followed by her PCP.  She reports that she recently started back walking and is tolerating well.  Labwork  independently reviewed: 05/28/2023: Sodium 140, potassium 4.2, creatinine 1.38, AST 17, ALT 9 ROS: .   Today she denies chest pain, shortness of breath, lower extremity edema, fatigue, palpitations, melena, hematuria, hemoptysis, diaphoresis, weakness, presyncope, syncope, orthopnea, and PND.  All other systems are reviewed and otherwise negative. Studies Reviewed: Marland Kitchen   EKG:  EKG is not ordered today.  EKG reviewed from 04/22/2023 indicating normal sinus rhythm with T wave abnormality/inversion in inferior lateral leads, similarly noted on EKG in 2023, more prominent.  CV Studies: Cardiac studies reviewed are outlined and summarized above. Otherwise please see EMR for full report. Cardiac Studies & Procedures   ______________________________________________________________________________________________ CARDIAC CATHETERIZATION  CARDIAC CATHETERIZATION 07/16/2014  Narrative  Prox RCA lesion, 30% stenosed.  Mid RCA lesion, 60% stenosed.  RPDA lesion, 40% stenosed.  Post Atrio lesion, 20% stenosed.  Dist RCA lesion, 20% stenosed.  Prox Cx to Mid Cx lesion, 20% stenosed.  1st Mrg lesion, 20% stenosed.  2nd Mrg lesion, 40% stenosed.  Ost 3rd Mrg to 3rd Mrg lesion, 50% stenosed.  Ost LAD to Prox LAD lesion, 30% stenosed.  Mid LAD lesion, 30% stenosed.  Dist LAD lesion, 60% stenosed.  1. Diffuse moderate triple vessel CAD without focal culprit lesion for NSTEMI. 2. Moderate stenosis mid RCA which does not appear to be a ruptured plaque and is not flow limiting 3. Moderate stenosis in the small caliber distal LAD which does not appear to be a ruptured plaque and is not flow limiting. 4. Moderate disease in the small caliber third obtuse marginal branch 5. Normal LV  function 6. NSTEMI with diffuse moderate CAD but no focal culprit lesion  Recommendations: Will manage with medical therapy. I will load with Plavix today. She will need at least 3 months of dual anti-platelet therapy  with ASA and Plavix given presentation as acute coronary syndrome. Continue beta blocker and statin. Aggressive risk factor modification including weight loss, diet changes, control of lipids and BP. Would monitor x 12-24 more hours. Follow up after discharge with Dr. Delton See.  Findings Coronary Findings Diagnostic  Dominance: Right  Left Anterior Descending diffuse . diffuse . discrete .  The distal vessel become small in caliber and is diffusely diseased.  Left Circumflex diffuse .  First Obtuse Marginal Branch The vessel is small in size. diffuse .  Second Obtuse Marginal Branch diffuse .  Third Obtuse Marginal Branch The vessel is small in size. diffuse .  Right Coronary Artery diffuse . discrete . diffuse .  Right Posterior Descending Artery diffuse .  Right Posterior Atrioventricular Artery diffuse .  Intervention  No interventions have been documented.   STRESS TESTS  MYOCARDIAL PERFUSION IMAGING 11/27/2021  Narrative   The study is normal. The study is low risk.   No ST deviation was noted.   LV perfusion is normal.   Left ventricular function is normal. Nuclear stress EF: 57 %. The left ventricular ejection fraction is normal (55-65%). End diastolic cavity size is normal. End systolic cavity size is normal.   Prior study not available for comparison.   ECHOCARDIOGRAM  ECHOCARDIOGRAM COMPLETE 10/28/2021  Narrative ECHOCARDIOGRAM REPORT    Patient Name:   Tammy [name] Date of Exam: 10/28/2021 Medical Rec #:  [id number]        Height:       66.0 in Accession #:    [id number]       Weight:       271.8 lb Date of Birth:  27-Dec-1955        BSA:          2.278 m Patient Age:    66 years         BP:           140/86 mmHg Patient Gender: F                HR:           45 bpm. Exam Location:  [address]  Procedure: 2D Echo, Cardiac Doppler and Color Doppler  Indications:    I25.10 CAD  History:        Patient has prior history of  Echocardiogram examinations, most recent 07/16/2014. CAD and Previous Myocardial Infarction; Risk Factors:Hypertension, Dyslipidemia, Morbid obesity and Current Smoker.  Sonographer:    Samule Ohm RDCS Referring Phys: [id number] Tammy [name]   Sonographer Comments: Patient is obese. IMPRESSIONS   1. Posterior basal hypokinesis . Left ventricular ejection fraction, by estimation, is 55%. The left ventricle has normal function. The left ventricle demonstrates regional wall motion abnormalities (see scoring diagram/findings for description). There is mild left ventricular hypertrophy. Left ventricular diastolic parameters were normal. 2. Right ventricular systolic function is normal. The right ventricular size is normal. 3. The mitral valve is abnormal. Mild mitral valve regurgitation. No evidence of mitral stenosis. 4. The aortic valve is normal in structure. Aortic valve regurgitation is not visualized. No aortic stenosis is present. 5. The inferior vena cava is normal in size with greater than 50% respiratory variability, suggesting right atrial pressure of 3 mmHg.  FINDINGS Left Ventricle: Posterior  basal hypokinesis. Left ventricular ejection fraction, by estimation, is 55%. The left ventricle has normal function. The left ventricle demonstrates regional wall motion abnormalities. The left ventricular internal cavity size was normal in size. There is mild left ventricular hypertrophy. Left ventricular diastolic parameters were normal.  Right Ventricle: The right ventricular size is normal. No increase in right ventricular wall thickness. Right ventricular systolic function is normal.  Left Atrium: Left atrial size was normal in size.  Right Atrium: Right atrial size was normal in size.  Pericardium: There is no evidence of pericardial effusion.  Mitral Valve: The mitral valve is abnormal. There is mild thickening of the mitral valve leaflet(s). Mild mitral valve  regurgitation. No evidence of mitral valve stenosis.  Tricuspid Valve: The tricuspid valve is normal in structure. Tricuspid valve regurgitation is mild . No evidence of tricuspid stenosis.  Aortic Valve: The aortic valve is normal in structure. Aortic valve regurgitation is not visualized. No aortic stenosis is present.  Pulmonic Valve: The pulmonic valve was normal in structure. Pulmonic valve regurgitation is not visualized. No evidence of pulmonic stenosis.  Aorta: The aortic root is normal in size and structure.  Venous: The inferior vena cava is normal in size with greater than 50% respiratory variability, suggesting right atrial pressure of 3 mmHg.  IAS/Shunts: No atrial level shunt detected by color flow Doppler.   LEFT VENTRICLE PLAX 2D LVIDd:         5.30 cm   Diastology LVIDs:         2.90 cm   LV e' medial:    6.64 cm/s LV PW:         1.30 cm   LV E/e' medial:  10.9 LV IVS:        1.30 cm   LV e' lateral:   9.79 cm/s LVOT diam:     2.30 cm   LV E/e' lateral: 7.4 LV SV:         98 LV SV Index:   43 LVOT Area:     4.15 cm   RIGHT VENTRICLE             IVC RV S prime:     12.10 cm/s  IVC diam: 1.30 cm TAPSE (M-mode): 2.0 cm  LEFT ATRIUM              Index        RIGHT ATRIUM           Index LA diam:        4.00 cm  1.76 cm/m   RA Pressure: 3.00 mmHg LA Vol (A2C):   116.0 ml 50.92 ml/m  RA Area:     17.70 cm LA Vol (A4C):   61.0 ml  26.77 ml/m  RA Volume:   46.70 ml  20.50 ml/m LA Biplane Vol: 84.0 ml  36.87 ml/m AORTIC VALVE LVOT Vmax:   99.00 cm/s LVOT Vmean:  61.200 cm/s LVOT VTI:    0.236 m  AORTA Ao Root diam: 3.20 cm Ao Asc diam:  3.20 cm  MITRAL VALVE               TRICUSPID VALVE MV Area (PHT): 3.01 cm    Estimated RAP:  3.00 mmHg MV Decel Time: 252 msec MV E velocity: 72.30 cm/s  SHUNTS MV A velocity: 60.80 cm/s  Systemic VTI:  0.24 m MV E/A ratio:  1.19        Systemic Diam: 2.30 cm  Charlton Haws MD Electronically signed  by Charlton Haws  MD Signature Date/Time: 10/28/2021/10:01:30 AM    Final          ______________________________________________________________________________________________       Current Reported Medications:.    Current Meds  Medication Sig   amLODipine (NORVASC) 10 MG tablet Take 1 tablet (10 mg total) by mouth daily.   aspirin EC 81 MG tablet Take 1 tablet (81 mg total) by mouth daily.   baclofen (LIORESAL) 10 MG tablet Take 0.5 tablets (5 mg total) by mouth 2 (two) times daily as needed for muscle spasms.   Blood Pressure Monitor MISC Use as directed to check blood pressure.   clindamycin (CLEOCIN-T) 1 % external solution Apply topically 2 (two) times daily. Apply to lesion on mons pubis twice daily for 1 week   clopidogrel (PLAVIX) 75 MG tablet Take 1 tablet (75 mg total) by mouth daily.   Evolocumab (REPATHA SURECLICK) 140 MG/ML SOAJ Inject 140 mg into the skin every 14 (fourteen) days.   ezetimibe (ZETIA) 10 MG tablet Take 1 tablet (10 mg total) by mouth at bedtime.   furosemide (LASIX) 20 MG tablet MWF   hydrALAZINE (APRESOLINE) 50 MG tablet Take 1 tablet (50 mg total) by mouth 2 (two) times daily.   Insulin Pen Needle 31G X 8 MM MISC Use with Wegovy   ketoconazole (NIZORAL) 2 % cream Apply 1 application. topically daily.   losartan (COZAAR) 25 MG tablet Take 25 mg by mouth daily.   metoprolol tartrate (LOPRESSOR) 100 MG tablet Take 1 tablet (100 mg total) by mouth 2 (two) times daily.   nitroGLYCERIN (NITROSTAT) 0.4 MG SL tablet Place 1 tablet (0.4 mg total) under the tongue every 5 (five) minutes as needed for chest pain.   sodium bicarbonate 650 MG tablet Take 1 tablet (650 mg total) by mouth 2 (two) times daily.   Vitamin D, Ergocalciferol, (DRISDOL) 1.25 MG (50000 UNIT) CAPS capsule Take 1 capsule (50,000 Units total) by mouth once a week.   Current Facility-Administered Medications for the 06/01/23 encounter (Office Visit) with Reather Littler D, NP  Medication   acetaminophen  (TYLENOL) tablet 975 mg   Physical Exam:    VS:  BP 134/78   Pulse 64   Ht 5\' 6"  (1.676 m)   Wt 269 lb 6.4 oz (122.2 kg)   SpO2 98%   BMI 43.48 kg/m    Wt Readings from Last 3 Encounters:  06/01/23 269 lb 6.4 oz (122.2 kg)  05/28/23 267 lb (121.1 kg)  04/30/23 266 lb 12.8 oz (121 kg)    GEN: Well nourished, well developed in no acute distress NECK: No JVD; No carotid bruits CARDIAC: RRR, no murmurs, rubs, gallops RESPIRATORY:  Clear to auscultation without rales, wheezing or rhonchi  ABDOMEN: Soft, non-tender, non-distended EXTREMITIES:  No edema; No acute deformity     Asessement and Plan:.    CAD: s/p NSTEMI in 07/2015, LHC showed mild to moderate nonobstructive disease and was medically managed. TTE in 2023 obtained due to concern for PHTN showed LVEF 55% with inferior hypokinesis, normal RV, no significant valve disease.  Follow-up Myoview with normal perfusion. Today patient reports that she has been doing well, denies any chest pain or shortness of breath. Heart healthy diet and regular cardiovascular exercise encouraged.  Reviewed ED precautions.  Continue amlodipine 10 mg daily, aspirin 81 mg daily, Plavix 75 mg daily, Repatha, Zetia, Lasix, hydralazine, metoprolol tartrate 100 mg twice daily, sublingual nitroglycerin as needed.  HTN: Blood pressure today 146/88, on recheck was  134/80. Continue current antihypertensive regimen.  Prediabetes: Last hemoglobin A1c 5.8.  Monitor and managed per PCP, is planning to restart Wegovy at decreased dose.  Tobacco use: Patient reports she currently smokes, she is unsure of how much she smokes in a day, notes that this does fluctuate.  She is currently working with Dr. Manson Passey, her PCP on cessation.  Is considering starting on nicotine patches.  Hyperlipidemia: No recent lipid profile available. Check fasting lipid profile.    Disposition: F/u with Reather Littler, NP in six months or sooner if needed.   Signed, Rip Harbour, NP

## 2023-05-31 ENCOUNTER — Encounter (HOSPITAL_BASED_OUTPATIENT_CLINIC_OR_DEPARTMENT_OTHER): Payer: Self-pay

## 2023-06-01 ENCOUNTER — Telehealth: Payer: Self-pay

## 2023-06-01 ENCOUNTER — Other Ambulatory Visit (HOSPITAL_COMMUNITY): Payer: Self-pay

## 2023-06-01 ENCOUNTER — Telehealth: Payer: Self-pay | Admitting: Family Medicine

## 2023-06-01 ENCOUNTER — Ambulatory Visit: Attending: Cardiology | Admitting: Cardiology

## 2023-06-01 ENCOUNTER — Telehealth: Payer: Self-pay | Admitting: Pharmacist

## 2023-06-01 ENCOUNTER — Encounter: Payer: Self-pay | Admitting: Cardiology

## 2023-06-01 VITALS — BP 134/78 | HR 64 | Ht 66.0 in | Wt 269.4 lb

## 2023-06-01 DIAGNOSIS — Z720 Tobacco use: Secondary | ICD-10-CM | POA: Diagnosis not present

## 2023-06-01 DIAGNOSIS — R7303 Prediabetes: Secondary | ICD-10-CM

## 2023-06-01 DIAGNOSIS — I10 Essential (primary) hypertension: Secondary | ICD-10-CM

## 2023-06-01 DIAGNOSIS — I2510 Atherosclerotic heart disease of native coronary artery without angina pectoris: Secondary | ICD-10-CM | POA: Diagnosis not present

## 2023-06-01 DIAGNOSIS — I739 Peripheral vascular disease, unspecified: Secondary | ICD-10-CM | POA: Diagnosis not present

## 2023-06-01 DIAGNOSIS — F17200 Nicotine dependence, unspecified, uncomplicated: Secondary | ICD-10-CM

## 2023-06-01 MED ORDER — NICOTINE 14 MG/24HR TD PT24: 14.0000 mg | MEDICATED_PATCH | Freq: Every day | TRANSDERMAL | 1 refills | Status: AC

## 2023-06-01 MED ORDER — NICOTINE POLACRILEX 2 MG MT GUM: 2.0000 mg | CHEWING_GUM | OROMUCOSAL | 1 refills | Status: AC | PRN

## 2023-06-01 NOTE — Telephone Encounter (Signed)
 Red team- please call patient and identify what concerns she has. I will do my best to call as my time allows (likely tomorrow AM) [name], MD  [facility] Medicine Teaching Service

## 2023-06-01 NOTE — Telephone Encounter (Signed)
 Called patient. She reports she is doing well. ZOXWRU copay $50--Pharmacy team can you see if we can get a discount or run through Medicaid?  [name], MD  Family Medicine Teaching Service

## 2023-06-01 NOTE — Telephone Encounter (Signed)
 Patient calls requesting Dr Manson Passey give her a call regarding what the cardiologist just told her today. Can you give patient a call, when you get a chance?

## 2023-06-01 NOTE — Telephone Encounter (Signed)
 Patient contacted for follow/up of tobacco intake reduction / cessation attempt.   Since last visit with Dr. Manson Passey, patient reports nicotine patch cost was too much.  She reports typically using [facility].   We discussed Medicaid secondary should cover nicotine patches at $0  I agreed to send prescription for Nicotine patches 14mg  [facility]. I asked her to call us back if the patches remain > $0 copay.   Provided information on 1 800-QUIT NOW support program.  Patient is participating in a Managed Medicaid Plan:  Yes  Total time with patient call and documentation of interaction: 12 minutes. Follow-up phone call planned: None

## 2023-06-01 NOTE — Telephone Encounter (Signed)
 Received notification from patient that insurance does not cover Nicotine patches at pharmacy.   Dr. Raymondo Band- do we still have program for tobacco cessation?   Veronda Prude, RN

## 2023-06-01 NOTE — Patient Instructions (Signed)
 Medication Instructions:  No changes  Lab Work: Please return in the next two weeks for a FASTING Lipid Panel (Can go to any [facility] or back to our office)   Follow-Up: At [facility], you and your health needs are our priority.  As part of our continuing mission to provide you with exceptional heart care, our providers are all part of one team.  This team includes your primary Cardiologist (physician) and Advanced Practice Providers or APPs (Physician Assistants and Nurse Practitioners) who all work together to provide you with the care you need, when you need it.  Your next appointment:   6 month(s) (We will mail a reminder letter, then you can call to make the appointment, due around 12/01/2023)  Provider:   Verneita Griffes, NP    Other Instructions Please call or send a MyChart message with any Cardiology related concerns.

## 2023-06-01 NOTE — Telephone Encounter (Signed)
 Pt wants to let dr brown know that Np that she saw at her cardiologist today is under the doctor she doesn't want to see. So she wants to be referred to someone else. Please advise. Hamdi Vari Bruna Potter, CMA

## 2023-06-01 NOTE — Telephone Encounter (Signed)
 Reviewed and agree with Dr Macky Lower plan.

## 2023-06-02 ENCOUNTER — Other Ambulatory Visit: Payer: Self-pay | Admitting: Family Medicine

## 2023-06-02 DIAGNOSIS — I739 Peripheral vascular disease, unspecified: Secondary | ICD-10-CM

## 2023-06-02 NOTE — Telephone Encounter (Signed)
 Called and discussed--really like [name] and will continue with her. Tammy Starr, MD  Family Medicine Teaching Service

## 2023-06-02 NOTE — Telephone Encounter (Signed)
 Called and discussed

## 2023-06-04 ENCOUNTER — Other Ambulatory Visit (HOSPITAL_COMMUNITY): Payer: Self-pay

## 2023-06-10 DIAGNOSIS — N1832 Chronic kidney disease, stage 3b: Secondary | ICD-10-CM | POA: Diagnosis not present

## 2023-06-15 ENCOUNTER — Other Ambulatory Visit: Payer: Self-pay

## 2023-06-15 DIAGNOSIS — I739 Peripheral vascular disease, unspecified: Secondary | ICD-10-CM

## 2023-06-16 ENCOUNTER — Encounter: Payer: Self-pay | Admitting: Family Medicine

## 2023-06-16 LAB — LIPID PANEL
Chol/HDL Ratio: 2.6 ratio (ref 0.0–4.4)
Cholesterol, Total: 144 mg/dL (ref 100–199)
HDL: 56 mg/dL (ref >39)
LDL Chol Calc (NIH): 70 mg/dL (ref 0–99)
Triglycerides: 96 mg/dL (ref 0–149)
VLDL Cholesterol Cal: 18 mg/dL (ref 5–40)

## 2023-06-24 ENCOUNTER — Ambulatory Visit

## 2023-06-24 VITALS — Ht 66.0 in | Wt 260.0 lb

## 2023-06-24 DIAGNOSIS — Z0000 Encounter for general adult medical examination without abnormal findings: Secondary | ICD-10-CM

## 2023-06-24 NOTE — Patient Instructions (Signed)
 Tammy [name] , Thank you for taking time to come for your Medicare Wellness Visit. I appreciate your ongoing commitment to your health goals. Please review the following plan we discussed and let me know if I can assist you in the future.   Referrals/Orders/Follow-Ups/Clinician Recommendations: Yes, keep maintaining your health by keeping your appointments with Dr. Otho Blitz and any specialists that you may see.  Call us  if you need anything.  Have a great year!!!!  This is a list of the screening recommended for you and due dates:  Health Maintenance  Topic Date Due   DTaP/Tdap/Td vaccine (3 - Td or Tdap) 06/17/2016   DEXA scan (bone density measurement)  Never done   COVID-19 Vaccine (7 - 2024-25 season) 06/21/2023   Flu Shot  10/01/2023   Medicare Annual Wellness Visit  06/23/2024   Mammogram  09/14/2024   Colon Cancer Screening  08/24/2029   Pneumonia Vaccine  Completed   Hepatitis C Screening  Completed   Zoster (Shingles) Vaccine  Completed   HPV Vaccine  Aged Out   Meningitis B Vaccine  Aged Out    Advanced directives: (Declined) Advance directive discussed with you today. Even though you declined this today, please call our office should you change your mind, and we can give you the proper paperwork for you to fill out.  Next Medicare Annual Wellness Visit scheduled for next year: Yes

## 2023-06-24 NOTE — Progress Notes (Signed)
 Subjective:   Tammy [name] is a 68 y.o. who presents for a Medicare Wellness preventive visit.  Visit Complete: Virtual I connected with  Tammy [name] on 06/24/23 by a audio enabled telemedicine application and verified that I am speaking with the correct person using two identifiers.  Patient Location: Home  Provider Location: Office/Clinic  I discussed the limitations of evaluation and management by telemedicine. The patient expressed understanding and agreed to proceed.  Vital Signs: Because this visit was a virtual/telehealth visit, some criteria may be missing or patient reported. Any vitals not documented were not able to be obtained and vitals that have been documented are patient reported.  VideoDeclined- This patient declined Librarian, academic. Therefore the visit was completed with audio only.  Persons Participating in Visit: Patient.  AWV Questionnaire: No: Patient Medicare AWV questionnaire was not completed prior to this visit.  Cardiac Risk Factors include: advanced age (>53men, >46 women);sedentary lifestyle;dyslipidemia;family history of premature cardiovascular disease;hypertension;obesity (BMI >30kg/m2)     Objective:    Today's Vitals   06/24/23 1651  Weight: 260 lb (117.9 kg)  Height: 5\' 6"  (1.676 m)  PainSc: 0-No pain   Body mass index is 41.97 kg/m.     06/24/2023    4:52 PM 05/28/2023    8:30 AM 04/30/2023   11:23 AM 09/22/2022   10:01 AM 07/11/2022    8:45 PM 06/19/2022   11:10 AM 05/15/2022    9:53 AM  Advanced Directives  Does Patient Have a Medical Advance Directive? No No No No No No No  Would patient like information on creating a medical advance directive? No - Patient declined No - Patient declined No - Patient declined No - Patient declined Yes (MAU/Ambulatory/Procedural Areas - Information given) No - Patient declined No - Patient declined    Current Medications (verified) Outpatient Encounter  Medications as of 06/24/2023  Medication Sig   amLODipine  (NORVASC ) 10 MG tablet Take 1 tablet (10 mg total) by mouth daily.   aspirin  EC 81 MG tablet Take 1 tablet (81 mg total) by mouth daily.   baclofen  (LIORESAL ) 10 MG tablet Take 0.5 tablets (5 mg total) by mouth 2 (two) times daily as needed for muscle spasms.   Blood Pressure Monitor MISC Use as directed to check blood pressure.   clindamycin  (CLEOCIN -T) 1 % external solution Apply topically 2 (two) times daily. Apply to lesion on mons pubis twice daily for 1 week   clopidogrel  (PLAVIX ) 75 MG tablet Take 1 tablet (75 mg total) by mouth daily.   Evolocumab  (REPATHA  SURECLICK) 140 MG/ML SOAJ Inject 140 mg into the skin every 14 (fourteen) days.   ezetimibe  (ZETIA ) 10 MG tablet Take 1 tablet (10 mg total) by mouth at bedtime.   furosemide  (LASIX ) 20 MG tablet MWF   hydrALAZINE  (APRESOLINE ) 50 MG tablet Take 1 tablet (50 mg total) by mouth 2 (two) times daily.   Insulin  Pen Needle 31G X 8 MM MISC Use with Wegovy    ketoconazole  (NIZORAL ) 2 % cream Apply 1 application. topically daily.   losartan  (COZAAR ) 25 MG tablet Take 25 mg by mouth daily.   metoprolol  tartrate (LOPRESSOR ) 100 MG tablet Take 1 tablet (100 mg total) by mouth 2 (two) times daily.   nicotine  (NICODERM CQ ) 14 mg/24hr patch Place 1 patch (14 mg total) onto the skin daily.   nicotine  polacrilex (NICORETTE ) 2 MG gum Take 1 each (2 mg total) by mouth as needed for smoking cessation.   nitroGLYCERIN  (  NITROSTAT ) 0.4 MG SL tablet Place 1 tablet (0.4 mg total) under the tongue every 5 (five) minutes as needed for chest pain.   Semaglutide -Weight Management (WEGOVY ) 0.25 MG/0.5ML SOAJ Inject 0.25 mg into the skin once a week. (Patient not taking: Reported on 06/01/2023)   sodium bicarbonate  650 MG tablet Take 1 tablet (650 mg total) by mouth 2 (two) times daily.   Vitamin D , Ergocalciferol , (DRISDOL ) 1.25 MG (50000 UNIT) CAPS capsule Take 1 capsule (50,000 Units total) by mouth once a  week.   Facility-Administered Encounter Medications as of 06/24/2023  Medication   acetaminophen  (TYLENOL ) tablet 975 mg    Allergies (verified) Ace inhibitors, Carvedilol , Hydrochlorothiazide, Losartan , and Rosuvastatin    History: Past Medical History:  Diagnosis Date   Abnormal mammogram 10.25.11   area of density with several adjacent amorphous calcifications located laterally w/in the right breast at approx 9-10 o'clock position likely represents area of evolving far necrosis; f/u mamo and U/S in 6 months   CAD (coronary artery disease)    a. 07/2014 NSTEMI/Cath: LM nl, LAD 30ost/m, 60d, LCX 20p, OM1 20, OM2 40, OM3 50, RCA 30p, 14m, 20d, RPDA 40, EF 65%-->Med Rx;  b. 07/2014 Echo: EF 55-60%, no rwma, mildly dil LA.   Chronic kidney disease    Essential hypertension    GERD (gastroesophageal reflux disease)    Hyperlipidemia    Morbid obesity (HCC)    PAD (peripheral artery disease) (HCC)    a. 07/2014 ABI's: R 0.88, L 0.32.   Ulcer    Past Surgical History:  Procedure Laterality Date   BREAST BIOPSY Left 09/01/2018   BREAST BIOPSY Left 10/07/2021   CARDIAC CATHETERIZATION N/A 07/16/2014   Procedure: Left Heart Cath and Coronary Angiography;  Surgeon: Odie Benne, MD;  Location: [facility] INVASIVE CV LAB;  Service: Cardiovascular;  Laterality: N/A;   CESAREAN SECTION     PERIPHERAL VASCULAR CATHETERIZATION N/A 02/12/2015   Procedure: Abdominal Aortogram;  Surgeon: Margherita Shell, MD;  Location: [facility] INVASIVE CV LAB;  Service: Cardiovascular;  Laterality: N/A;   TRIGGER FINGER RELEASE Left    middle finger   UPPER GASTROINTESTINAL ENDOSCOPY  1991   Family History  Problem Relation Age of Onset   Cancer Mother        Bladder cancer, cause of death   Diabetes Mother    Diabetes Father    Hypertension Father    Hypertension Sister    Hypertension Brother    Cancer Maternal Aunt    Breast cancer Maternal Aunt        unsure of age   Ovarian cancer Maternal Aunt     Social History   Socioeconomic History   Marital status: Single    Spouse name: Not on file   Number of children: Not on file   Years of education: Not on file   Highest education level: Not on file  Occupational History   Not on file  Tobacco Use   Smoking status: Every Day    Current packs/day: 0.25    Average packs/day: 0.3 packs/day for 30.0 years (7.5 ttl pk-yrs)    Types: Cigarettes   Smokeless tobacco: Never   Tobacco comments:    8-10 cigarettes per day or more  Substance and Sexual Activity   Alcohol use: No    Alcohol/week: 0.0 standard drinks of alcohol   Drug use: No   Sexual activity: Not Currently    Birth control/protection: Post-menopausal, Abstinence  Other Topics Concern   Not on file  Social History Narrative   Not on file   Social Drivers of Health   Financial Resource Strain: Low Risk  (06/24/2023)   Overall Financial Resource Strain (CARDIA)    Difficulty of Paying Living Expenses: Not hard at all  Food Insecurity: No Food Insecurity (06/24/2023)   Hunger Vital Sign    Worried About Running Out of Food in the Last Year: Never true    Ran Out of Food in the Last Year: Never true  Transportation Needs: No Transportation Needs (06/24/2023)   PRAPARE - Administrator, Civil Service (Medical): No    Lack of Transportation (Non-Medical): No  Physical Activity: Insufficiently Active (06/24/2023)   Exercise Vital Sign    Days of Exercise per Week: 3 days    Minutes of Exercise per Session: 30 min  Stress: No Stress Concern Present (06/24/2023)   [employer] of Occupational Health - Occupational Stress Questionnaire    Feeling of Stress : Not at all  Social Connections: Unknown (06/24/2023)   Social Connection and Isolation Panel [NHANES]    Frequency of Communication with Friends and Family: More than three times a week    Frequency of Social Gatherings with Friends and Family: Three times a week    Attends Religious Services: More  than 4 times per year    Active Member of Clubs or Organizations: No    Attends Banker Meetings: Never    Marital Status: Patient declined    Tobacco Counseling Ready to quit: Not Answered Counseling given: Not Answered Tobacco comments: 8-10 cigarettes per day or more    Clinical Intake:  Pre-visit preparation completed: Yes  Pain : No/denies pain Pain Score: 0-No pain     BMI - recorded: 41.97 Nutritional Status: BMI > 30  Obese Nutritional Risks: None Diabetes: No  Lab Results  Component Value Date   HGBA1C 5.8 (H) 11/13/2022   HGBA1C 5.6 05/15/2022   HGBA1C 5.2 12/26/2021     How often do you need to have someone help you when you read instructions, pamphlets, or other written materials from your doctor or pharmacy?: 1 - Never  Interpreter Needed?: No  Information entered by :: Lateka Rady N. Shanaye Rief, LPN.   Activities of Daily Living     06/24/2023    4:55 PM 07/11/2022    8:40 PM  In your present state of health, do you have any difficulty performing the following activities:  Hearing? 0 0  Vision? 0 0  Difficulty concentrating or making decisions? 0 0  Comment BSE: reading, puzzles, games on phone   Walking or climbing stairs? 0 0  Dressing or bathing? 0 0  Doing errands, shopping? 0 0  Preparing Food and eating ? N N  Using the Toilet? N N  In the past six months, have you accidently leaked urine? N N  Do you have problems with loss of bowel control? N N  Managing your Medications? N N  Managing your Finances? N N  Housekeeping or managing your Housekeeping? N N    Patient Care Team: Azell Boll, MD as PCP - General (Family Medicine) [facility], [facility]  Indicate any recent Medical Services you may have received from other than [facility] providers in the past year (date may be approximate).     Assessment:   This is a routine wellness examination for [place].  Hearing/Vision screen Hearing Screening - Comments::  Denies hearing difficulties.  Vision Screening - Comments:: Wears reading glasses - not  up to date with routine eye exams.    Goals Addressed   None    Depression Screen     06/24/2023    4:56 PM 05/28/2023    8:30 AM 04/30/2023    9:34 AM 03/12/2023    8:38 AM 01/15/2023    8:41 AM 12/21/2022   10:24 AM 11/13/2022    9:52 AM  PHQ 2/9 Scores  PHQ - 2 Score 0  0 0 0 0 0  PHQ- 9 Score 0  0 0 0 0 0  Exception Documentation  Patient refusal         Fall Risk     06/24/2023    4:53 PM 04/30/2023    9:34 AM 03/12/2023    8:38 AM 01/15/2023    8:41 AM 12/21/2022   10:24 AM  Fall Risk   Falls in the past year? 0 0 0 0 0  Number falls in past yr: 0 0 0 0 0  Injury with Fall? 0 0 0 0 0  Risk for fall due to : No Fall Risks      Follow up Falls prevention discussed;Falls evaluation completed        MEDICARE RISK AT HOME:  Medicare Risk at Home Any stairs in or around the home?: No [facility] ONLY) If so, are there any without handrails?: No Home free of loose throw rugs in walkways, pet beds, electrical cords, etc?: Yes Adequate lighting in your home to reduce risk of falls?: Yes Life alert?: No Use of a cane, walker or w/c?: No Grab bars in the bathroom?: Yes Shower chair or bench in shower?: No Elevated toilet seat or a handicapped toilet?: No  TIMED UP AND GO:  Was the test performed?  No  Cognitive Function: 6CIT completed    06/24/2023    5:01 PM  MMSE - Mini Mental State Exam  Not completed: Unable to complete        06/24/2023    4:56 PM 07/11/2022    8:45 PM  6CIT Screen  What Year? 0 points 0 points  What month? 0 points 0 points  What time? 0 points 0 points  Count back from 20 0 points 0 points  Months in reverse 0 points 0 points  Repeat phrase 0 points 0 points  Total Score 0 points 0 points    Immunizations Immunization History  Administered Date(s) Administered   Fluad Quad(high Dose 65+) 12/26/2021   Influenza Split 03/11/2012    Influenza,inj,Quad PF,6+ Mos 11/28/2012, 06/01/2014, 04/29/2015, 01/13/2017, 12/22/2017, 12/05/2018, 11/10/2019, 11/11/2020   Influenza-Unspecified 11/13/2022   PFIZER Comirnaty(Gray Top)Covid-19 Tri-Sucrose Vaccine 04/08/2020, 09/23/2020   PFIZER(Purple Top)SARS-COV-2 Vaccination 08/02/2019, 08/23/2019   PNEUMOCOCCAL CONJUGATE-20 12/26/2021   PPD Test 11/23/2011, 11/28/2012, 11/03/2016   Pfizer Covid-19 Vaccine Bivalent Booster 13yrs & up 01/06/2021   Pfizer(Comirnaty)Fall Seasonal Vaccine 12 years and older 12/21/2022   Tdap 06/18/2006, 06/18/2006   Zoster Recombinant(Shingrix ) 10/29/2020, 02/25/2021    Screening Tests Health Maintenance  Topic Date Due   DTaP/Tdap/Td (3 - Td or Tdap) 06/17/2016   DEXA SCAN  Never done   COVID-19 Vaccine (7 - 2024-25 season) 06/21/2023   INFLUENZA VACCINE  10/01/2023   Medicare Annual Wellness (AWV)  06/23/2024   MAMMOGRAM  09/14/2024   Colonoscopy  08/24/2029   Pneumonia Vaccine 16+ Years old  Completed   Hepatitis C Screening  Completed   Zoster Vaccines- Shingrix   Completed   HPV VACCINES  Aged Out   Meningococcal B Vaccine  Aged Out  Health Maintenance  Health Maintenance Due  Topic Date Due   DTaP/Tdap/Td (3 - Td or Tdap) 06/17/2016   DEXA SCAN  Never done   COVID-19 Vaccine (7 - 2024-25 season) 06/21/2023   Health Maintenance Items Addressed: Yes, Patient is due for DEXA Scan, Dtap and Covid vaccines.  Additional Screening:  Vision Screening: Recommended annual ophthalmology exams for early detection of glaucoma and other disorders of the eye.  Dental Screening: Recommended annual dental exams for proper oral hygiene  Community Resource Referral / Chronic Care Management: CRR required this visit?  No   CCM required this visit?  No     Plan:     I have personally reviewed and noted the following in the patient's chart:   Medical and social history Use of alcohol, tobacco or illicit drugs  Current medications and  supplements including opioid prescriptions. Patient is not currently taking opioid prescriptions. Functional ability and status Nutritional status Physical activity Advanced directives List of other physicians Hospitalizations, surgeries, and ER visits in previous 12 months Vitals Screenings to include cognitive, depression, and falls Referrals and appointments  In addition, I have reviewed and discussed with patient certain preventive protocols, quality metrics, and best practice recommendations. A written personalized care plan for preventive services as well as general preventive health recommendations were provided to patient.     Margette Sheldon, LPN   [id number]   After Visit Summary: (MyChart) Due to this being a telephonic visit, the after visit summary with patients personalized plan was offered to patient via MyChart   Notes: Please refer to Routing Comments.

## 2023-06-28 ENCOUNTER — Other Ambulatory Visit: Payer: Self-pay

## 2023-06-28 DIAGNOSIS — I10 Essential (primary) hypertension: Secondary | ICD-10-CM

## 2023-06-29 ENCOUNTER — Other Ambulatory Visit (HOSPITAL_COMMUNITY): Payer: Self-pay

## 2023-06-29 MED ORDER — WEGOVY 0.25 MG/0.5ML ~~LOC~~ SOAJ: 0.2500 mg | SUBCUTANEOUS | 0 refills | Status: DC

## 2023-06-29 MED ORDER — WEGOVY 0.25 MG/0.5ML ~~LOC~~ SOAJ
0.2500 mg | SUBCUTANEOUS | 0 refills | Status: DC
  Filled 2023-06-29: qty 2, 28d supply, fill #0

## 2023-06-29 NOTE — Addendum Note (Signed)
 Addended by: Earla Charlie C on: 06/29/2023 05:06 PM   Modules accepted: Orders

## 2023-07-01 ENCOUNTER — Other Ambulatory Visit (HOSPITAL_COMMUNITY): Payer: Self-pay

## 2023-07-02 ENCOUNTER — Other Ambulatory Visit: Payer: Self-pay | Admitting: *Deleted

## 2023-07-02 DIAGNOSIS — I739 Peripheral vascular disease, unspecified: Secondary | ICD-10-CM

## 2023-07-02 DIAGNOSIS — I2510 Atherosclerotic heart disease of native coronary artery without angina pectoris: Secondary | ICD-10-CM

## 2023-07-02 MED ORDER — EZETIMIBE 10 MG PO TABS: 10.0000 mg | ORAL_TABLET | Freq: Every evening | ORAL | 3 refills | Status: AC

## 2023-07-13 DIAGNOSIS — E559 Vitamin D deficiency, unspecified: Secondary | ICD-10-CM | POA: Diagnosis not present

## 2023-07-13 DIAGNOSIS — N1831 Chronic kidney disease, stage 3a: Secondary | ICD-10-CM | POA: Diagnosis not present

## 2023-07-13 DIAGNOSIS — E872 Acidosis, unspecified: Secondary | ICD-10-CM | POA: Diagnosis not present

## 2023-07-13 DIAGNOSIS — N281 Cyst of kidney, acquired: Secondary | ICD-10-CM | POA: Diagnosis not present

## 2023-07-13 DIAGNOSIS — Z720 Tobacco use: Secondary | ICD-10-CM | POA: Diagnosis not present

## 2023-07-13 DIAGNOSIS — I2510 Atherosclerotic heart disease of native coronary artery without angina pectoris: Secondary | ICD-10-CM | POA: Diagnosis not present

## 2023-07-13 DIAGNOSIS — E875 Hyperkalemia: Secondary | ICD-10-CM | POA: Diagnosis not present

## 2023-07-13 DIAGNOSIS — I129 Hypertensive chronic kidney disease with stage 1 through stage 4 chronic kidney disease, or unspecified chronic kidney disease: Secondary | ICD-10-CM | POA: Diagnosis not present

## 2023-07-13 DIAGNOSIS — N2581 Secondary hyperparathyroidism of renal origin: Secondary | ICD-10-CM | POA: Diagnosis not present

## 2023-07-14 ENCOUNTER — Encounter: Payer: Self-pay | Admitting: Cardiovascular Disease

## 2023-07-15 NOTE — Progress Notes (Unsigned)
    SUBJECTIVE:   CHIEF COMPLAINT: weight check HPI:   CHARLES SHARER is a 68 y.o.  with history notable for HTN, CKD, and obesity  presenting for follow  up  Tobacco use ***  Weight loss Patient currently on Wegovy  weekly at  2.5 mg.  They are tolerating this well  Current weight :   Weight loss trend: There were no vitals filed for this visit. The patient has been making the following dietary changes limiting portions.  The patient has increasing physical activity through the following: walking The patient denies a personal history of eating disorder, gastroparesis  or pancreatitis. No family history of medullary thyroid  cancer. No symptoms of gallstones.  There is no a chance of pregnancy given patient's history/age/gender.  Aaron Aas   PERTINENT  PMH / PSH/Family/Social History : ***  OBJECTIVE:   There were no vitals taken for this visit.  Today's weight:  Review of prior weights: There were no vitals filed for this visit.  ***  ASSESSMENT/PLAN:  Assessment & Plan    Otho Blitz, MD  Family Medicine Teaching Service  [facility]

## 2023-07-16 ENCOUNTER — Encounter: Payer: Self-pay | Admitting: Family Medicine

## 2023-07-16 ENCOUNTER — Other Ambulatory Visit (HOSPITAL_COMMUNITY): Payer: Self-pay

## 2023-07-16 ENCOUNTER — Ambulatory Visit: Admitting: Family Medicine

## 2023-07-16 DIAGNOSIS — I739 Peripheral vascular disease, unspecified: Secondary | ICD-10-CM | POA: Diagnosis not present

## 2023-07-16 DIAGNOSIS — F17200 Nicotine dependence, unspecified, uncomplicated: Secondary | ICD-10-CM | POA: Diagnosis not present

## 2023-07-16 DIAGNOSIS — E782 Mixed hyperlipidemia: Secondary | ICD-10-CM | POA: Diagnosis not present

## 2023-07-16 DIAGNOSIS — I288 Other diseases of pulmonary vessels: Secondary | ICD-10-CM

## 2023-07-16 DIAGNOSIS — I2510 Atherosclerotic heart disease of native coronary artery without angina pectoris: Secondary | ICD-10-CM

## 2023-07-16 MED ORDER — WEGOVY 0.5 MG/0.5ML ~~LOC~~ SOAJ
0.5000 mg | SUBCUTANEOUS | 1 refills | Status: DC
  Filled 2023-07-16: qty 2, 28d supply, fill #0

## 2023-07-16 NOTE — Assessment & Plan Note (Signed)
 On a PCSK9 inhibitor and Zetia .  She had angioedema that was potentially related to starting a statin.

## 2023-07-16 NOTE — Patient Instructions (Signed)
 It was wonderful to see you today.  Please bring ALL of your medications with you to every visit.   Today we talked about:   I will be on the look out for Dr. Almyra Jain note   I will check with Dr. Koval about the patches  Follow up in 1 month   I sent in your increased dose of Wegovy  [facility]  I recommend you undergo a DEXA-- bone density.   You can call to schedule an appointment by calling [phone].   Directions [address] [place], [place] [address]  Please let me know if you have questions. I will send you a letter or call you with results.     Please follow up in 1 months   Thank you for choosing [facility].   Please call [phone] with any questions about today's appointment.  Please be sure to schedule follow up at the front  desk before you leave today.   Otho Blitz, MD  Family Medicine

## 2023-07-16 NOTE — Assessment & Plan Note (Signed)
 discussed quit strategies.  She would like to try the patches but finds them too expensive.  Routing to Dr. Koval to see if there are alternative options.

## 2023-07-16 NOTE — Assessment & Plan Note (Signed)
 Previously seen by Pulmonary who recommended weight loss and tobacco cessation

## 2023-07-16 NOTE — Assessment & Plan Note (Signed)
 Asymptomatic and doing well without symptoms.

## 2023-07-16 NOTE — Assessment & Plan Note (Signed)
 Increased Wegovy  to 0.5 mg weekly She is making significant strides with her activity.  Discussed dietary changes.

## 2023-07-16 NOTE — Assessment & Plan Note (Signed)
 Has follow-up with vascular surgery for this

## 2023-07-21 ENCOUNTER — Other Ambulatory Visit (HOSPITAL_COMMUNITY): Payer: Self-pay

## 2023-08-03 ENCOUNTER — Other Ambulatory Visit: Payer: Self-pay | Admitting: Pharmacist

## 2023-08-03 MED ORDER — REPATHA SURECLICK 140 MG/ML ~~LOC~~ SOAJ
140.0000 mg | SUBCUTANEOUS | 11 refills | Status: AC
Start: 2023-08-03 — End: ?

## 2023-08-18 ENCOUNTER — Other Ambulatory Visit: Payer: Self-pay | Admitting: Family Medicine

## 2023-08-18 DIAGNOSIS — Z1231 Encounter for screening mammogram for malignant neoplasm of breast: Secondary | ICD-10-CM

## 2023-08-24 ENCOUNTER — Ambulatory Visit: Attending: Cardiovascular Disease | Admitting: Cardiovascular Disease

## 2023-08-24 VITALS — BP 147/83 | HR 69 | Ht 66.0 in | Wt 261.5 lb

## 2023-08-24 DIAGNOSIS — I739 Peripheral vascular disease, unspecified: Secondary | ICD-10-CM | POA: Diagnosis not present

## 2023-08-24 DIAGNOSIS — I10 Essential (primary) hypertension: Secondary | ICD-10-CM | POA: Diagnosis not present

## 2023-08-24 DIAGNOSIS — E785 Hyperlipidemia, unspecified: Secondary | ICD-10-CM

## 2023-08-24 DIAGNOSIS — I2510 Atherosclerotic heart disease of native coronary artery without angina pectoris: Secondary | ICD-10-CM | POA: Diagnosis not present

## 2023-08-24 DIAGNOSIS — Z720 Tobacco use: Secondary | ICD-10-CM

## 2023-08-24 NOTE — Patient Instructions (Signed)
 Medication Instructions:  Your physician recommends that you continue on your current medications as directed. Please refer to the Current Medication list given to you today.    *If you need a refill on your cardiac medications before your next appointment, please call your pharmacy*   Lab Work: NONE    If you have labs (blood work) drawn today and your tests are completely normal, you will receive your results only by: MyChart Message (if you have MyChart) OR A paper copy in the mail If you have any lab test that is abnormal or we need to change your treatment, we will call you to review the results.   Testing/Procedures: NONE    Follow-Up: At [facility], you and your health needs are our priority.  As part of our continuing mission to provide you with exceptional heart care, we have created designated Provider Care Teams.  These Care Teams include your primary Cardiologist (physician) and Advanced Practice Providers (APPs -  Physician Assistants and Nurse Practitioners) who all work together to provide you with the care you need, when you need it.  We recommend signing up for the patient portal called MyChart.  Sign up information is provided on this After Visit Summary.  MyChart is used to connect with patients for Virtual Visits (Telemedicine).  Patients are able to view lab/test results, encounter notes, upcoming appointments, etc.  Non-urgent messages can be sent to your provider as well.   To learn more about what you can do with MyChart, go to [email or web address].    Your next appointment:   1 year(s)  The format for your next appointment:   In Person  Provider:   Virgene Cage, MD    Other Instructions

## 2023-08-24 NOTE — Progress Notes (Signed)
 Cardiology Office Note   Date:  08/24/2023   ID:  Tammy [name], DOB 07-03-1955, MRN [id number]  PCP:  Delores Suzann HERO, MD  Cardiologist:  Dr. Sheena Johns chief complaint on file.     History of Present Illness: Tammy [name] is a 68 y.o. female who is here today for a follow-up visit regarding peripheral arterial disease.   She has known history of coronary artery disease with previous non-STEMI managed medically, essential hypertension, hyperlipidemia, obesity, tobacco use and peripheral arterial disease.  Previous cardiac catheterization in 2016 showed mild to moderate nonobstructive coronary artery disease. She had lower extremity angiogram in 2016 by Dr. Serene that showed flush occlusion of the left SFA with reconstitution in the proximal above-the-knee popliteal artery.  Medical therapy was recommended.  Previously, she had no obstructive disease on the right side with normal ABI.  However, Doppler studies in May 2024  showed an ABI of 0.44 bilaterally.  Duplex showed occluded right SFA. Her symptoms included bilateral calf claudication worse on the right side but were not lifestyle limiting.  Medical therapy was recommended.  I strongly advised her to quit smoking.  She has been doing reasonably well and denies chest pain or worsening dyspnea.  She reports stable bilateral calf claudication that is worse on the right side but not severe.  No lower extremity ulceration.  She is trying to quit smoking.  She is currently smoking 10 cigarettes/day.   Past Medical History:  Diagnosis Date   Abnormal mammogram 10.25.11   area of density with several adjacent amorphous calcifications located laterally w/in the right breast at approx 9-10 o'clock position likely represents area of evolving far necrosis; f/u mamo and U/S in 6 months   CAD (coronary artery disease)    a. 07/2014 NSTEMI/Cath: LM nl, LAD 30ost/m, 60d, LCX 20p, OM1 20, OM2 40, OM3 50, RCA 30p, 66m, 20d, RPDA 40, EF  65%-->Med Rx;  b. 07/2014 Echo: EF 55-60%, no rwma, mildly dil LA.   Chronic kidney disease    Essential hypertension    GERD (gastroesophageal reflux disease)    Hyperlipidemia    Morbid obesity (HCC)    PAD (peripheral artery disease) (HCC)    a. 07/2014 ABI's: R 0.88, L 0.32.   Ulcer     Past Surgical History:  Procedure Laterality Date   BREAST BIOPSY Left 09/01/2018   BREAST BIOPSY Left 10/07/2021   CARDIAC CATHETERIZATION N/A 07/16/2014   Procedure: Left Heart Cath and Coronary Angiography;  Surgeon: Lonni JONETTA Cash, MD;  Location: [facility] INVASIVE CV LAB;  Service: Cardiovascular;  Laterality: N/A;   CESAREAN SECTION     PERIPHERAL VASCULAR CATHETERIZATION N/A 02/12/2015   Procedure: Abdominal Aortogram;  Surgeon: Gaile LELON Serene, MD;  Location: [facility] INVASIVE CV LAB;  Service: Cardiovascular;  Laterality: N/A;   TRIGGER FINGER RELEASE Left    middle finger   UPPER GASTROINTESTINAL ENDOSCOPY  1991     Current Outpatient Medications  Medication Sig Dispense Refill   amLODipine  (NORVASC ) 10 MG tablet Take 1 tablet (10 mg total) by mouth daily. 90 tablet 3   aspirin  EC 81 MG tablet Take 1 tablet (81 mg total) by mouth daily. 90 tablet 3   baclofen  (LIORESAL ) 10 MG tablet Take 0.5 tablets (5 mg total) by mouth 2 (two) times daily as needed for muscle spasms. 10 each 0   Blood Pressure Monitor MISC Use as directed to check blood pressure. 1 each 0   clindamycin  (CLEOCIN -T) 1 %  external solution Apply topically 2 (two) times daily. Apply to lesion on mons pubis twice daily for 1 week 60 mL 2   clopidogrel  (PLAVIX ) 75 MG tablet Take 1 tablet (75 mg total) by mouth daily. 90 tablet 3   Evolocumab  (REPATHA  SURECLICK) 140 MG/ML SOAJ Inject 140 mg into the skin every 14 (fourteen) days. 2 mL 11   ezetimibe  (ZETIA ) 10 MG tablet Take 1 tablet (10 mg total) by mouth at bedtime. 90 tablet 3   furosemide  (LASIX ) 20 MG tablet MWF 60 tablet 3   hydrALAZINE  (APRESOLINE ) 50 MG tablet Take 1  tablet (50 mg total) by mouth 2 (two) times daily. 180 tablet 2   Insulin  Pen Needle 31G X 8 MM MISC Use with Wegovy  100 each 0   ketoconazole  (NIZORAL ) 2 % cream Apply 1 application. topically daily. 60 g 1   metoprolol  tartrate (LOPRESSOR ) 100 MG tablet Take 1 tablet (100 mg total) by mouth 2 (two) times daily. 180 tablet 3   nicotine  (NICODERM CQ ) 14 mg/24hr patch Place 1 patch (14 mg total) onto the skin daily. 28 patch 1   nicotine  polacrilex (NICORETTE ) 2 MG gum Take 1 each (2 mg total) by mouth as needed for smoking cessation. 100 tablet 1   nitroGLYCERIN  (NITROSTAT ) 0.4 MG SL tablet Place 1 tablet (0.4 mg total) under the tongue every 5 (five) minutes as needed for chest pain. 90 tablet 1   Semaglutide -Weight Management (WEGOVY ) 0.5 MG/0.5ML SOAJ Inject 0.5 mg into the skin once a week. 2 mL 1   sodium bicarbonate  650 MG tablet Take 1 tablet (650 mg total) by mouth 2 (two) times daily. 180 tablet 3   Current Facility-Administered Medications  Medication Dose Route Frequency Provider Last Rate Last Admin   acetaminophen  (TYLENOL ) tablet 975 mg  975 mg Oral Once Warden, Daniel L, MD        Allergies:   Ace inhibitors, Carvedilol , Hydrochlorothiazide, Losartan , and Rosuvastatin     Social History:  The patient  reports that she has been smoking cigarettes. She has a 7.5 pack-year smoking history. She has never used smokeless tobacco. She reports that she does not drink alcohol and does not use drugs.   Family History:  The patient's family history includes Breast cancer in her maternal aunt; Cancer in her maternal aunt and mother; Diabetes in her father and mother; Hypertension in her brother, father, and sister; Ovarian cancer in her maternal aunt.    ROS:  Please see the history of present illness.   Otherwise, review of systems are positive for none.   All other systems are reviewed and negative.    PHYSICAL EXAM: VS:  BP (!) 147/83 (BP Location: Left Arm, Patient Position: Sitting)    Pulse 69   Ht 5' 6 (1.676 m)   Wt 261 lb 8 oz (118.6 kg)   SpO2 98%   BMI 42.21 kg/m  , BMI Body mass index is 42.21 kg/m. GEN: Well nourished, well developed, in no acute distress  HEENT: normal  Neck: no JVD, carotid bruits, or masses Cardiac: RRR; no rubs, or gallops,no edema . 1/6 systolic ejection murmur at the base Respiratory:  clear to auscultation bilaterally, normal work of breathing GI: soft, nontender, nondistended, + BS MS: no deformity or atrophy  Skin: warm and dry, no rash Neuro:  Strength and sensation are intact Psych: euthymic mood, full affect   EKG:  EKG is not ordered today.    Recent Labs: 04/30/2023: Hemoglobin 13.7; Magnesium 1.8; Platelets  242 05/28/2023: ALT 9; BUN 14; Creatinine, Ser 1.38; Potassium 4.2; Sodium 140    Lipid Panel    Component Value Date/Time   CHOL 144 06/15/2023 0925   TRIG 96 06/15/2023 0925   HDL 56 06/15/2023 0925   CHOLHDL 2.6 06/15/2023 0925   CHOLHDL 4.3 07/15/2014 0600   VLDL 23 07/15/2014 0600   LDLCALC 70 06/15/2023 0925   LDLDIRECT 100 (H) 02/10/2021 1112      Wt Readings from Last 3 Encounters:  08/24/23 261 lb 8 oz (118.6 kg)  07/16/23 269 lb 3.2 oz (122.1 kg)  06/24/23 260 lb (117.9 kg)           No data to display            ASSESSMENT AND PLAN:  1.  Peripheral arterial disease: Mild to moderate bilateral calf claudication due to a long occlusion of the SFA bilaterally.  Her claudication is currently not lifestyle limiting and she has no evidence of critical limb ischemia.  No indication for revascularization.  I recommend continuing medical therapy.  I discussed with her the importance of daily physical activities.  2.  Coronary artery disease involving native coronary arteries without angina: She denies chest pain at the present time.  Continue medical therapy.  3.  Essential hypertension: Blood pressure is reasonably controlled on current medications.  4.  Hyperlipidemia: She is  currently on Repatha  and ezetimibe .  Most recent lipid profile showed an LDL of 70.  5.  Tobacco use: I again discussed with her the importance of smoking cessation.  She was prescribed a nicotine  patch but was not approved.  She is working with her primary care physician on this.  6.  Chronic kidney disease stage IIIb: Stable renal function.    Disposition:   FU with me in 12 months  Signed,  Deatrice Cage, MD  08/24/2023 8:34 AM    [facility]

## 2023-08-26 NOTE — Progress Notes (Addendum)
 SUBJECTIVE:   CHIEF COMPLAINT: weight loss HPI:   Tammy [name] is a 68 y.o.  with history notable for CKD, obesity, and CAD presenting for follow up. The patient reports several stressors.  She has a history of a mental health condition.  She reports her anxiety is at an all-time high.  Her daughter who is 49 years old is pregnant.  She has 3 grandchildren who range from age 40 to their 54s.  The patient is upset about this.  She is interested in talking to a therapist about boundaries.   The patient reports compliance with her medications she denies headaches chest pains.  The patient reports a few weeks ago she had some left-sided back pain.  This is resolved.  She has history of chronic back pain.  This improved with her muscle relaxer.  No bowel or bladder incontinence.  No sciatic type pain.  The patient reports she was getting out of bed and walking out of her room when her foot caught on her chair.  She fell and hit her right shoulder gently on the carpet.  She denies loss of consciousness.  She did not hit her head.  This was witnessed.  She is since move the chair.  Weight loss Patient currently on Wegovy  weekly at  0.5 mg.  They are tolerating this well  Current weight :   Was 269 at last visit  Last Weight  Most recent update: 08/27/2023  9:21 AM    Weight  120.7 kg (266 lb)             Weight loss trend:  Filed Weights   08/27/23 0919  Weight: 266 lb (120.7 kg)   The patient has been making the following dietary changes decreasing portions.  The patient has increasing physical activity through the following: increased walking The patient denies a personal history of eating disorder, gastroparesis  or pancreatitis. No family history of medullary thyroid  cancer. No symptoms of gallstones.  There is no a chance of pregnancy given patient's history/age/gender.     PERTINENT  PMH / PSH/Family/Social History : HTN  OBJECTIVE:   BP 137/82   Pulse 63   Wt  266 lb (120.7 kg)   SpO2 97%   BMI 42.93 kg/m   Today's weight:  Last Weight  Most recent update: 08/27/2023  9:21 AM    Weight  120.7 kg (266 lb)            Review of prior weights: [employer]   08/27/23 0919  Weight: 266 lb (120.7 kg)     Cardiac: Regular rate and rhythm. Normal S1/S2. No murmurs, rubs, or gallops appreciated. Lungs: Clear bilaterally to ascultation.  Psych: Pleasant and appropriate  On exam back exam there is no rash.  Mildly tender to palpation along the left paraspinal and SI joint.  She is ambulating well.  Bilateral shoulder exam.  She has some pain with abduction of the right arm.  No significant tenderness to palpation.  There is no deformity.  She has good strength and range of motion.  ASSESSMENT/PLAN:   Assessment & Plan Morbid obesity (HCC) She has gained weight since her last visit with her vascular doctor. Increase the dose to 1 mg.  She has not had any side effects.  We discussed she is to call immediately should she have any side effects like she had with the higher dose at 2.4 mg. TOBACCO DEPENDENCE Recommended cessation.  This is elevating her blood  pressure.  She is smoking 1/2 pack and her stress level is increasing her smoking.  Supportive listening provided as below.  Patches sent to her pharmacy. Coronary artery disease involving native coronary artery of native heart without angina pectoris No symptoms.  She does need a new cardiologist.  A message was sent to the practitioner saw her most recently to request this. Anxiety state Supportive listening provided.  Referred to the [facility] for counseling services and connecting with the clinical counselor. Stage 3b chronic kidney disease (HCC) Monitoring labs today.  Will adjust her bicarbonate accordingly.    Suzann Daring, MD  Family Medicine Teaching Service  [facility]

## 2023-08-27 ENCOUNTER — Other Ambulatory Visit (HOSPITAL_COMMUNITY): Payer: Self-pay

## 2023-08-27 ENCOUNTER — Ambulatory Visit (INDEPENDENT_AMBULATORY_CARE_PROVIDER_SITE_OTHER): Admitting: Family Medicine

## 2023-08-27 ENCOUNTER — Encounter: Payer: Self-pay | Admitting: Family Medicine

## 2023-08-27 DIAGNOSIS — F411 Generalized anxiety disorder: Secondary | ICD-10-CM

## 2023-08-27 DIAGNOSIS — F17200 Nicotine dependence, unspecified, uncomplicated: Secondary | ICD-10-CM

## 2023-08-27 DIAGNOSIS — N1832 Chronic kidney disease, stage 3b: Secondary | ICD-10-CM

## 2023-08-27 DIAGNOSIS — I2510 Atherosclerotic heart disease of native coronary artery without angina pectoris: Secondary | ICD-10-CM

## 2023-08-27 MED ORDER — NICOTINE 14 MG/24HR TD PT24
14.0000 mg | MEDICATED_PATCH | Freq: Every day | TRANSDERMAL | 0 refills | Status: DC
  Filled 2023-08-27: qty 28, 28d supply, fill #0

## 2023-08-27 MED ORDER — WEGOVY 1 MG/0.5ML ~~LOC~~ SOAJ
1.0000 mg | SUBCUTANEOUS | 1 refills | Status: DC
  Filled 2023-08-27 – 2023-09-24 (×2): qty 2, 28d supply, fill #0

## 2023-08-27 MED ORDER — WEGOVY 0.5 MG/0.5ML ~~LOC~~ SOAJ
0.5000 mg | SUBCUTANEOUS | 0 refills | Status: DC
  Filled 2023-08-27: qty 2, 28d supply, fill #0

## 2023-08-27 NOTE — Patient Instructions (Signed)
 It was wonderful to see you today.  Please bring ALL of your medications with you to every visit.   Today we talked about:  I will message Cardiology about a new physician   Please call if you fall again  I sent in your Wegovy   CALL IMMEDIATELY IF YOU FEEL NAUSEATED  Harlene will call you about a counselor  I sent in nicotine  patches   Call if they are too much money   We will check blood work for the sodium bicarbonate   Please schedule your bone density test with your mammogram   Please follow up in 3 months   Thank you for choosing [facility].   Please call [phone] with any questions about today's appointment.  Please be sure to schedule follow up at the front  desk before you leave today.   Suzann Daring, MD  Family Medicine

## 2023-08-27 NOTE — Assessment & Plan Note (Signed)
 No symptoms.  She does need a new cardiologist.  A message was sent to the practitioner saw her most recently to request this.

## 2023-08-27 NOTE — Assessment & Plan Note (Signed)
 Monitoring labs today.  Will adjust her bicarbonate accordingly.

## 2023-08-27 NOTE — Assessment & Plan Note (Signed)
 Recommended cessation.  This is elevating her blood pressure.  She is smoking 1/2 pack and her stress level is increasing her smoking.  Supportive listening provided as below.  Patches sent to her pharmacy.

## 2023-08-27 NOTE — Assessment & Plan Note (Signed)
 She has gained weight since her last visit with her vascular doctor. Increase the dose to 1 mg.  She has not had any side effects.  We discussed she is to call immediately should she have any side effects like she had with the higher dose at 2.4 mg.

## 2023-08-28 ENCOUNTER — Ambulatory Visit: Payer: Self-pay | Admitting: Family Medicine

## 2023-08-28 LAB — BASIC METABOLIC PANEL WITH GFR
BUN/Creatinine Ratio: 15 (ref 12–28)
BUN: 20 mg/dL (ref 8–27)
CO2: 19 mmol/L — ABNORMAL LOW (ref 20–29)
Calcium: 9.5 mg/dL (ref 8.7–10.3)
Chloride: 102 mmol/L (ref 96–106)
Creatinine, Ser: 1.35 mg/dL — ABNORMAL HIGH (ref 0.57–1.00)
Glucose: 95 mg/dL (ref 70–99)
Potassium: 4.4 mmol/L (ref 3.5–5.2)
Sodium: 138 mmol/L (ref 134–144)
eGFR: 43 mL/min/{1.73_m2} — ABNORMAL LOW (ref >59)

## 2023-08-30 NOTE — Telephone Encounter (Signed)
 Patient returns call to nurse line.   Discussed improvement in kidney function.   Discussed increasing sodium bicarbonate  to twice per day.  All questions answered.

## 2023-08-30 NOTE — Telephone Encounter (Signed)
 Attempted to call patient. Reached voicemail, left generic voicemail to call back.  If she calls back  Kidney function looks slightly better than prior!   She needs to increase her sodium bicarbonate  (bicarbonate) back to TWICE a day  Suzann Daring, MD  [facility] Medicine Teaching Service

## 2023-09-02 ENCOUNTER — Telehealth: Payer: Self-pay | Admitting: *Deleted

## 2023-09-02 NOTE — Progress Notes (Signed)
 Complex Care Management Note Care Guide Note  09/02/2023 Name: Tammy [name] MRN: [id number] DOB: [birth year]   Complex Care Management Outreach Attempts: An unsuccessful telephone outreach was attempted today to offer the patient information about available complex care management services.  Follow Up Plan:  Additional outreach attempts will be made to offer the patient complex care management information and services.   Encounter Outcome:  No Answer  Harlene Satterfield  [facility]  [facility], [facility] Guide  Direct Dial: [phone]  Fax [phone]

## 2023-09-07 NOTE — Progress Notes (Unsigned)
 Complex Care Management Note Care Guide Note  09/07/2023 Name: Tammy [name] MRN: [id number] DOB: 21-Aug-1955   Complex Care Management Outreach Attempts: A second unsuccessful outreach was attempted today to offer the patient with information about available complex care management services.  Follow Up Plan:  Additional outreach attempts will be made to offer the patient complex care management information and services.   Encounter Outcome:  No Answer  Harlene Satterfield  [facility]  [facility], [facility] Guide  Direct Dial: [phone]  Fax [phone]

## 2023-09-08 ENCOUNTER — Telehealth: Payer: Self-pay | Admitting: *Deleted

## 2023-09-08 NOTE — Progress Notes (Signed)
 Complex Care Management Note Care Guide Note  09/08/2023 Name: Tammy [name] MRN: [id number] DOB: 07-Mar-1955   Complex Care Management Outreach Attempts: A third unsuccessful outreach was attempted today to offer the patient with information about available complex care management services.  Follow Up Plan:  No further outreach attempts will be made at this time. We have been unable to contact the patient to offer or enroll patient in complex care management services.  Encounter Outcome:  No Answer  Harlene Satterfield  [facility]  [facility], [facility] Guide  Direct Dial: [phone]  Fax [phone]

## 2023-09-08 NOTE — Progress Notes (Signed)
 Complex Care Management Note  Care Guide Note 09/08/2023 Name: Tammy [name] MRN: [id number] DOB: May 29, 1955  Tammy [name] is a 68 y.o. year old female who sees Tammy Suzann HERO, MD for primary care.  Tammy [name] returned call by phone today to discuss complex care management services.  Tammy [name] was given information about Complex Care Management services today including:   The Complex Care Management services include support from the care team which includes your Nurse Care Manager, Clinical Social Worker, or Pharmacist.  The Complex Care Management team is here to help remove barriers to the health concerns and goals most important to you. Complex Care Management services are voluntary, and the patient may decline or stop services at any time by request to their care team member.   Complex Care Management Consent Status: Patient did not agree to participate in complex care management services at this time.  Follow up plan:  None  Encounter Outcome:  Patient Refused  Tammy [name]  [facility]  [facility], [facility] Guide  Direct Dial: [phone]  Fax [phone]

## 2023-09-17 ENCOUNTER — Ambulatory Visit

## 2023-09-23 ENCOUNTER — Telehealth: Payer: Self-pay

## 2023-09-23 NOTE — Telephone Encounter (Signed)
 Patient Tammy [name] on nurse line requesting a call back to discuss weight loss injections.   I attempted to call patient back, however no answer.   Will await her return call.

## 2023-09-24 ENCOUNTER — Other Ambulatory Visit (HOSPITAL_COMMUNITY): Payer: Self-pay

## 2023-09-24 NOTE — Telephone Encounter (Signed)
 Patient returns call to nurse line regarding Wegovy  refill.   Advised that Dr. Delores had already sent prescription for next dosage to pharmacy last month.   Patient will call pharmacy to begin process of refill.   Tammy JAYSON English, RN

## 2023-09-28 ENCOUNTER — Telehealth: Payer: Self-pay | Admitting: *Deleted

## 2023-09-28 DIAGNOSIS — N1832 Chronic kidney disease, stage 3b: Secondary | ICD-10-CM

## 2023-09-28 DIAGNOSIS — I2510 Atherosclerotic heart disease of native coronary artery without angina pectoris: Secondary | ICD-10-CM

## 2023-09-28 DIAGNOSIS — I10 Essential (primary) hypertension: Secondary | ICD-10-CM

## 2023-09-28 NOTE — Progress Notes (Signed)
 Complex Care Management Note  Care Guide Note 09/28/2023 Name: SHERISSE FULLILOVE MRN: [id number] DOB: Jul 15, 1955  BRIELLAH BAIK is a 68 y.o. year old female who sees Delores Suzann HERO, MD for primary care. Channing DELENA Kluver called by phone today for a self referral to complex care management services.  Ms. Fickett was given information about Complex Care Management services today including:   The Complex Care Management services include support from the care team which includes your Nurse Care Manager, Clinical Social Worker, or Pharmacist.  The Complex Care Management team is here to help remove barriers to the health concerns and goals most important to you. Complex Care Management services are voluntary, and the patient may decline or stop services at any time by request to their care team member.   Complex Care Management Consent Status: Patient agreed to services and verbal consent obtained.   Follow up plan:  Telephone appointment with complex care management team member scheduled for:  7/30  Encounter Outcome:  Patient Scheduled  Harlene Satterfield  [facility]  [facility], [facility] Guide  Direct Dial: [phone]  Fax [phone]

## 2023-09-29 ENCOUNTER — Other Ambulatory Visit: Payer: Self-pay

## 2023-09-29 NOTE — Patient Outreach (Addendum)
 Complex Care Management   Visit Note  09/29/2023  Name:  Tammy [name] MRN: [id number] DOB: 1955/09/20  Situation: Referral received for Complex Care Management related to SDOH Barriers:  Housing   I obtained verbal consent from Patient.  Visit completed with patient  on the phone  Background:   Past Medical History:  Diagnosis Date   Abnormal mammogram 10.25.11   area of density with several adjacent amorphous calcifications located laterally w/in the right breast at approx 9-10 o'clock position likely represents area of evolving far necrosis; f/u mamo and U/S in 6 months   CAD (coronary artery disease)    a. 07/2014 NSTEMI/Cath: LM nl, LAD 30ost/m, 60d, LCX 20p, OM1 20, OM2 40, OM3 50, RCA 30p, 53m, 20d, RPDA 40, EF 65%-->Med Rx;  b. 07/2014 Echo: EF 55-60%, no rwma, mildly dil LA.   Chronic kidney disease    Essential hypertension    GERD (gastroesophageal reflux disease)    Hyperlipidemia    Morbid obesity (HCC)    PAD (peripheral artery disease) (HCC)    a. 07/2014 ABI's: R 0.88, L 0.32.   Ulcer     Assessment:  BSW contacted Tammy [name] regarding a referral for housing assistance. During the call, BSW conducted an assessment for SDOH barriers and identified housing instability as a primary concern. Tammy [name] reported that she is currently residing with her sister but expressed that she no longer wishes to remain in that environment due to ongoing issues in the home. Tammy [name] shared that she had previously lived in subsidized housing for 10 years before moving in with her sister. She indicated plans to reapply for subsidized housing in [place]. In the interim, she will continue staying with her sister while she waits for housing availability. Additionally, Tammy [name] stated she has a plan to either move in with her grandson once he secures housing or temporarily stay with her daughter until she is able to obtain housing through the [employer]. BSW offered  complex care management services, which the patient declined at this time. BSW will send housing-related resources to the patient and informed patient to reach out if anymore support is needed. BSW will proceed with closing the case.    SDOH Interventions Today    Flowsheet Row Most Recent Value  SDOH Interventions   Housing Interventions Community Resources Provided     Recommendation:   No recommendations at this time  Follow Up Plan:   Patient has met all care management goals. Care Management case will be closed. Patient has been provided contact information should new needs arise.   Tammy [name], BSW   [facility] Social Worker, [employer] [facility] [phone]

## 2023-09-29 NOTE — Patient Instructions (Addendum)
 Visit Information  Thank you for taking time to visit with me today. Please don't hesitate to contact me if I can be of assistance to you before our next scheduled appointment.  Our next appointment is no further scheduled appointments.   Please call the care guide team at [phone] if you need to cancel or reschedule your appointment.    Please call the Suicide and Crisis Lifeline: 988 call the [place]  National Suicide Prevention Lifeline: 6020706364 or TTY: 623-840-8122 TTY 641-082-2735) to talk to a trained counselor call 1-800-273-TALK (toll free, 24 hour hotline) go to [facility] [address] [place] [id number]) call 911 if you are experiencing a Mental Health or Behavioral Health Crisis or need someone to talk to.  Patient verbalizes understanding of instructions and care plan provided today and agrees to view in MyChart. Active MyChart status and patient understanding of how to access instructions and care plan via MyChart confirmed with patient.     Haven Lion, BSW [place]  [facility] Social Worker, [employer] [facility] [phone]

## 2023-10-12 ENCOUNTER — Ambulatory Visit
Admission: RE | Admit: 2023-10-12 | Discharge: 2023-10-12 | Disposition: A | Discharge status: Home / Self Care | Source: Ambulatory Visit | Attending: Family Medicine

## 2023-10-12 DIAGNOSIS — Z1231 Encounter for screening mammogram for malignant neoplasm of breast: Secondary | ICD-10-CM | POA: Diagnosis not present

## 2023-10-13 ENCOUNTER — Ambulatory Visit: Payer: Self-pay | Admitting: Family Medicine

## 2023-10-14 ENCOUNTER — Telehealth: Payer: Self-pay

## 2023-10-14 MED ORDER — SEMAGLUTIDE-WEIGHT MANAGEMENT 1.7 MG/0.75ML ~~LOC~~ SOAJ: 1.7000 mg | SUBCUTANEOUS | 1 refills | Status: DC

## 2023-10-14 NOTE — Telephone Encounter (Signed)
 Patient calls nurse line regarding Wegovy  prescription.   She is asking for an increase in dosage. She reports that she has been on 1 mg, however, does not feel like her appetite is being suppressed.   Advised that OV is generally needed to titrate dosage due to insurance requirements. Patient does not have appointment with PCP until 11/05/23.  She asked that I reach out to Dr. Delores to see if we could attempt the dosage increase.   Please advise.   Chiquita JAYSON English, RN

## 2023-10-14 NOTE — Telephone Encounter (Signed)
 I have sent in the increased dose.   Okay for virtual visit if needed.  Suzann Daring, MD  Family Medicine Teaching Service

## 2023-10-15 ENCOUNTER — Ambulatory Visit: Admitting: Family Medicine

## 2023-11-04 NOTE — Progress Notes (Unsigned)
    SUBJECTIVE:   CHIEF COMPLAINT: hip pain HPI:   Tammy [name] is a 68 y.o.  with history notable for  HTN, CKD, and allergy to statin presenting for follow up.   Discussed the use of AI scribe software for clinical note transcription with the patient, who gave verbal consent to proceed.  History of Present Illness Hip pain      PERTINENT  PMH / PSH/Family/Social History : CAD, doing well   OBJECTIVE:   There were no vitals taken for this visit.  Today's weight:  Review of prior weights: There were no vitals filed for this visit.  ***  ASSESSMENT/PLAN:   Assessment & Plan Hip pain, unspecified laterality  TOBACCO DEPENDENCE  Stage 3b chronic kidney disease (HCC)  Essential hypertension  PAD (peripheral artery disease) (HCC)  Coronary artery disease involving native coronary artery of native heart without angina pectoris     Suzann Daring, MD  Family Medicine Teaching Service  [facility]

## 2023-11-05 ENCOUNTER — Other Ambulatory Visit (HOSPITAL_COMMUNITY): Payer: Self-pay

## 2023-11-05 ENCOUNTER — Ambulatory Visit (INDEPENDENT_AMBULATORY_CARE_PROVIDER_SITE_OTHER): Admitting: Family Medicine

## 2023-11-05 ENCOUNTER — Encounter: Payer: Self-pay | Admitting: Family Medicine

## 2023-11-05 VITALS — BP 119/78 | HR 80 | Ht 66.0 in | Wt 269.8 lb

## 2023-11-05 DIAGNOSIS — I2510 Atherosclerotic heart disease of native coronary artery without angina pectoris: Secondary | ICD-10-CM

## 2023-11-05 DIAGNOSIS — I10 Essential (primary) hypertension: Secondary | ICD-10-CM | POA: Diagnosis not present

## 2023-11-05 DIAGNOSIS — Z23 Encounter for immunization: Secondary | ICD-10-CM | POA: Diagnosis not present

## 2023-11-05 DIAGNOSIS — F17200 Nicotine dependence, unspecified, uncomplicated: Secondary | ICD-10-CM

## 2023-11-05 DIAGNOSIS — N1832 Chronic kidney disease, stage 3b: Secondary | ICD-10-CM

## 2023-11-05 DIAGNOSIS — M25559 Pain in unspecified hip: Secondary | ICD-10-CM | POA: Diagnosis not present

## 2023-11-05 DIAGNOSIS — I739 Peripheral vascular disease, unspecified: Secondary | ICD-10-CM

## 2023-11-05 MED ORDER — WEGOVY 2.4 MG/0.75ML ~~LOC~~ SOAJ
2.4000 mg | SUBCUTANEOUS | 3 refills | Status: DC
  Filled 2023-11-05: qty 3, 28d supply, fill #0

## 2023-11-05 NOTE — Progress Notes (Signed)
 Established Patient Office Visit  Subjective   Patient ID: Tammy [name], female    DOB: [birth year]  Age: 68 y.o. MRN: [id number]  Chief Complaint  Patient presents with   Hip Pain    HPI  (1) Hypertension The patient is present for a routine follow-up visit for management of hypertension. Patient report overall good blood pressure control since the last visit. Home blood pressure systolic readings average around 130s to 140s. The patient denies any recent episodes of headache, dizziness, vision changes, chest pain, shortness of breath, or palpitations. Patient report good adherence to their antihypertensive medications, which currently include Amlodipine , Furosemide , Hydralazine  and Metoprolol . Patient is aware of the importance of low-sodium dietary habits and report following the dietary recommendations. No recent hospitalizations, ER visits, or medication changes. No new questions or concerns.   (2) Obesity  The patient is present for a routine follow-up visit for management of obesity and weight loss. She has been taking Wegovy  1.7mg  weekly and reports that the medication is not suppressing her appetite as it used to. She would like to discuss increasing her dosage if appropriate. Has not been able to sustain her weight loss on current dose. She denies any side-effects due to medications.   Patient has had weight gain since last visit despite dose increase. She has gained 5 pounds She is walking more  She feels portion control is more difficult Has several social stressors contributing including death of father Has tired increasing activity every day and limiting portions but finds herself overeating   (3) Hip Pain Patient reports that she feel approximately six months ago on her right side. She mentions that she mainly fell on her right arm, but had some pain and discomfort in her right hip too. The pain has be completely resolved ever since and she reports no changes in her  gait or restriction in her range of motion. No questions or concerns noted today.     Review of Systems  Constitutional:  Negative for chills and fever.  HENT:  Negative for congestion, hearing loss and sore throat.   Eyes:  Negative for blurred vision.  Respiratory:  Negative for cough and shortness of breath.   Cardiovascular:  Negative for chest pain, palpitations and leg swelling.  Gastrointestinal:  Negative for abdominal pain, constipation, nausea and vomiting.  Genitourinary:  Negative for dysuria.  Musculoskeletal:  Negative for joint pain and myalgias.  Skin:  Negative for itching and rash.  Neurological:  Negative for sensory change, weakness and headaches.  Psychiatric/Behavioral:  Negative for depression and suicidal ideas. The patient is nervous/anxious (Reports that she is coping with loss of her father.).       Objective:     BP 119/78   Pulse 80   Ht 5' 6 (1.676 m)   Wt 269 lb 12.8 oz (122.4 kg)   SpO2 99%   BMI 43.55 kg/m    Physical Exam Constitutional:      General: She is not in acute distress.    Appearance: Normal appearance.  Cardiovascular:     Rate and Rhythm: Normal rate and regular rhythm.     Pulses: Normal pulses.     Heart sounds: Normal heart sounds. No murmur heard.    No friction rub. No gallop.  Pulmonary:     Effort: Pulmonary effort is normal. No respiratory distress.     Breath sounds: Normal breath sounds. No wheezing.  Musculoskeletal:        General: No swelling,  tenderness, deformity or signs of injury. Normal range of motion.  Neurological:     Mental Status: She is alert and oriented to person, place, and time.     Gait: Gait normal.      Assessment & Plan:   (1) Hypertension  - Well controlled with diet and recent weight loss.  - Continue taking Amlodipine , Furosemide , Hydralazine  and Metoprolol  as prescribed.  - Advised to monitor blood pressure regularly at home and RTC if notice any new-onset of symptoms.   BMP  at next visit, discuss bicarbonate   (2) Obesity/Weight Loss - Most likely reaching a plateau phase with current dosage, additionally recent stressor like loss of father might be inducing stress eating.  - Continue taking Wegovy  1.7mg  weekly for next two weeks and then increase to 2.5mg  weekly.  - Advised to continue monitoring diet and try to incorporate more exercise as tolerated.   (3) Hip Pain - No concerns of fracture or injury because the symptoms resolved spontaneously with no concerns or deficits reported today.  - Advised to contact us  if she notice new-onset of pain or changes in range of motion.   (4) CAD - Referral sent out to Cardiologist to re-establish care.  - Advised to call them and try to set up appointment for routine follow-up.   (5) Bone Density Scan - Referral order signed and sent.  - Advised to call them to set up an appointment.   (6) Vaccines  - Administered flu vaccine in clinic today.   RTC in 3 months for regular follow-up or sooner if needed.   Dotty Blanch, Medical Student  [employer]  at [facility] 11/05/23 10:21 AM   Patient seen along with MS2 student Dotty Blanch. I personally evaluated this patient along with the student, and verified all aspects of the history, physical exam, and medical decision making as documented by the student. I agree with the student's documentation and have made all necessary edits.

## 2023-11-05 NOTE — Patient Instructions (Addendum)
 It was wonderful to see you today.  Please bring ALL of your medications with you to every visit.   Today we talked about:  Your DEXA is  ordered   Please take 2 more doses of Wegovy  1.7 mg  THEN in 3 weeks start 2.4 mg weekly   Call the Cardiology office  [phone]  Ask for a female, physician cardiologist   Please follow up in 1 month  Thank you for choosing [facility].   Please call [phone] with any questions about today's appointment.  Please be sure to schedule follow up at the front  desk before you leave today.   Suzann Daring, MD  Family Medicine

## 2023-11-08 ENCOUNTER — Telehealth: Payer: Self-pay | Admitting: Family Medicine

## 2023-11-08 DIAGNOSIS — F17200 Nicotine dependence, unspecified, uncomplicated: Secondary | ICD-10-CM

## 2023-11-08 DIAGNOSIS — R911 Solitary pulmonary nodule: Secondary | ICD-10-CM

## 2023-11-08 NOTE — Telephone Encounter (Signed)
 Called patient, low dose CT ordered. Discussed increase in Wegovy  again  Suzann Daring, MD  [facility] Medicine Teaching Service

## 2023-11-11 ENCOUNTER — Encounter: Payer: Self-pay | Admitting: Cardiology

## 2023-11-18 ENCOUNTER — Telehealth: Payer: Self-pay

## 2023-11-18 DIAGNOSIS — F17200 Nicotine dependence, unspecified, uncomplicated: Secondary | ICD-10-CM

## 2023-11-18 NOTE — Telephone Encounter (Signed)
 New order placed.

## 2023-11-18 NOTE — Telephone Encounter (Signed)
 Patient calls nurse line in regards to CT chest.   She requests assistance in scheduling this at [facility].  I called centralized scheduling.  (1) The order needs to be changed to CT Chest WO.  (2) [facility] is not making outpatient apts for imaging at this time. She will need to go to [facility].  Will forward to PCP to have order changed.  Will discuss with patient once order has been changed.

## 2023-11-22 NOTE — Telephone Encounter (Signed)
 Called patient.   Advised [facility] is no longer doing outpatient CT scans.  Advised [facility] will be her only option.   She reports she would prefer for her daughter to be able to go with her. She reports she will call her to see which dates and times work best for her.   Will help her schedule at [facility] once she calls back with varied apt dates.

## 2023-11-25 ENCOUNTER — Ambulatory Visit (HOSPITAL_COMMUNITY)

## 2023-11-30 ENCOUNTER — Telehealth: Payer: Self-pay | Admitting: Cardiology

## 2023-11-30 NOTE — Telephone Encounter (Signed)
 Patient wants a provider switch from Dr. Sheena to Dr. Okey.

## 2023-12-02 ENCOUNTER — Ambulatory Visit (HOSPITAL_COMMUNITY)
Admission: RE | Admit: 2023-12-02 | Discharge: 2023-12-02 | Disposition: A | Discharge status: Home / Self Care | Source: Ambulatory Visit | Attending: Family Medicine | Admitting: Family Medicine

## 2023-12-02 DIAGNOSIS — I700 Atherosclerosis of aorta: Secondary | ICD-10-CM | POA: Diagnosis not present

## 2023-12-02 DIAGNOSIS — R918 Other nonspecific abnormal finding of lung field: Secondary | ICD-10-CM | POA: Diagnosis not present

## 2023-12-02 DIAGNOSIS — I2510 Atherosclerotic heart disease of native coronary artery without angina pectoris: Secondary | ICD-10-CM | POA: Diagnosis not present

## 2023-12-02 DIAGNOSIS — J439 Emphysema, unspecified: Secondary | ICD-10-CM | POA: Diagnosis not present

## 2023-12-02 DIAGNOSIS — F17200 Nicotine dependence, unspecified, uncomplicated: Secondary | ICD-10-CM | POA: Diagnosis not present

## 2023-12-02 DIAGNOSIS — R911 Solitary pulmonary nodule: Secondary | ICD-10-CM | POA: Diagnosis present

## 2023-12-08 ENCOUNTER — Other Ambulatory Visit (HOSPITAL_COMMUNITY): Payer: Self-pay

## 2023-12-08 ENCOUNTER — Ambulatory Visit: Payer: Self-pay | Admitting: Family Medicine

## 2023-12-08 DIAGNOSIS — I739 Peripheral vascular disease, unspecified: Secondary | ICD-10-CM

## 2023-12-08 DIAGNOSIS — I2510 Atherosclerotic heart disease of native coronary artery without angina pectoris: Secondary | ICD-10-CM

## 2023-12-08 MED ORDER — WEGOVY 2.4 MG/0.75ML ~~LOC~~ SOAJ
2.4000 mg | SUBCUTANEOUS | 3 refills | Status: DC
  Filled 2023-12-08: qty 3, 28d supply, fill #0

## 2023-12-08 NOTE — Telephone Encounter (Signed)
 Called patient, reviewed CT findings. Has known CAD, emphyesema, all discussed. Noted possible elevated pulmonary pressures--follows with cardiology, has known lung disease.  Consider echo at follow up, CT in 1 year Patient is out of Wegovy  (last dose). Has known CAD/PAD with obesity. Rx to pharmacy.  Suzann Daring, MD  Family Medicine Teaching Service

## 2023-12-09 ENCOUNTER — Other Ambulatory Visit (HOSPITAL_COMMUNITY): Payer: Self-pay

## 2023-12-09 ENCOUNTER — Ambulatory Visit (HOSPITAL_COMMUNITY)

## 2023-12-23 NOTE — Progress Notes (Signed)
" ° ° °  SUBJECTIVE:   CHIEF COMPLAINT: weight check HPI:   Tammy [name] is a 68 y.o.  with history notable for CAD, PAD, CKD IIIa, obesity presenting for follow up.   Discussed the use of AI scribe software for clinical note transcription with the patient, who gave verbal consent to proceed.  History of Present Illness Tammy [name] is a 68 year old female with hypertension and coronary artery disease who presents with abdominal pain and weakness.  Wegovy  issues - Last injected on Sunday - Having mild nausea, no vomiting, weakness, diffuse mild abdominal pain  - Significant weight loss due to inability to eat - Slight relief with drinking water and consuming chicken noodle soup broth - No vomiting, fever, or constipation at present - Felt constipated a few days ago, improved after taking a stool softener  Tobacco use - Smokes approximately ten cigarettes per day, with frequency influenced by stress - Intends to quit smoking in preparation for arrival of new grandchild  HTN - Taking medications as prescribed  - No headaches, chest pain or dyspnea      PERTINENT  PMH / PSH/Family/Social History : HTN, CAD s/p stent, PAD   OBJECTIVE:   BP (!) 151/89   Pulse 84   Ht 5' 6 (1.676 m)   Wt 251 lb 6.4 oz (114 kg)   SpO2 98%   BMI 40.58 kg/m   Today's weight:  Last Weight  Most recent update: 12/24/2023  9:13 AM    Weight  114 kg (251 lb 6.4 oz)            Review of prior weights: Filed Weights   12/24/23 0913  Weight: 251 lb 6.4 oz (114 kg)     Cardiac: Regular rate and rhythm. Normal S1/S2. No murmurs, rubs, or gallops appreciated. Lungs: Clear bilaterally to ascultation.  Abdomen: Normoactive bowel sounds. Mildly tender in suprapubic area No rebound or guarding.  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 Lasix   TOBACCO DEPENDENCE Discussed cessation and motivator of new grandchild     Suzann Daring, MD  Family Medicine Teaching Service  [facility]   "

## 2023-12-24 ENCOUNTER — Encounter: Payer: Self-pay | Admitting: Family Medicine

## 2023-12-24 ENCOUNTER — Other Ambulatory Visit (HOSPITAL_COMMUNITY): Payer: Self-pay

## 2023-12-24 ENCOUNTER — Ambulatory Visit: Admitting: Family Medicine

## 2023-12-24 VITALS — BP 151/89 | HR 84 | Ht 66.0 in | Wt 251.4 lb

## 2023-12-24 DIAGNOSIS — I2510 Atherosclerotic heart disease of native coronary artery without angina pectoris: Secondary | ICD-10-CM | POA: Diagnosis not present

## 2023-12-24 DIAGNOSIS — N1832 Chronic kidney disease, stage 3b: Secondary | ICD-10-CM | POA: Diagnosis not present

## 2023-12-24 DIAGNOSIS — F17200 Nicotine dependence, unspecified, uncomplicated: Secondary | ICD-10-CM | POA: Diagnosis not present

## 2023-12-24 DIAGNOSIS — I739 Peripheral vascular disease, unspecified: Secondary | ICD-10-CM

## 2023-12-24 DIAGNOSIS — Z23 Encounter for immunization: Secondary | ICD-10-CM | POA: Diagnosis not present

## 2023-12-24 DIAGNOSIS — I10 Essential (primary) hypertension: Secondary | ICD-10-CM | POA: Diagnosis not present

## 2023-12-24 DIAGNOSIS — R531 Weakness: Secondary | ICD-10-CM | POA: Diagnosis not present

## 2023-12-24 MED ORDER — METOPROLOL TARTRATE 100 MG PO TABS: 100.0000 mg | ORAL_TABLET | Freq: Two times a day (BID) | ORAL | 3 refills | Status: DC

## 2023-12-24 MED ORDER — NICOTINE 14 MG/24HR TD PT24
14.0000 mg | MEDICATED_PATCH | Freq: Every day | TRANSDERMAL | 0 refills | Status: AC
  Filled 2023-12-24: qty 28, 28d supply, fill #0

## 2023-12-24 MED ORDER — BACLOFEN 10 MG PO TABS: 5.0000 mg | ORAL_TABLET | Freq: Two times a day (BID) | ORAL | 3 refills | Status: DC | PRN

## 2023-12-24 NOTE — Patient Instructions (Signed)
 It was wonderful to see you today.  Please bring ALL of your medications with you to every visit.   Today we talked about:  STOP your Wegovy    I recommend stopping smoking--especially around new baby  I sent in patches  We will check labs today  I will call you Monday or Tuesday with results   I sent in your refills    Please follow up in 1 months   Thank you for choosing [facility].   Please call [phone] with any questions about today's appointment.  Please be sure to schedule follow up at the front  desk before you leave today.   Suzann Daring, MD  Family Medicine

## 2023-12-24 NOTE — Assessment & Plan Note (Signed)
 Labs today

## 2023-12-24 NOTE — Assessment & Plan Note (Addendum)
 Discussed cessation and motivator of new grandchild

## 2023-12-24 NOTE — Assessment & Plan Note (Signed)
 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

## 2023-12-24 NOTE — Assessment & Plan Note (Signed)
 Not at goal Pending labs increase Hydralazine  vs. Lasix 

## 2023-12-25 LAB — TSH RFX ON ABNORMAL TO FREE T4: TSH: 2.05 u[IU]/mL (ref 0.450–4.500)

## 2023-12-25 LAB — CBC
Hematocrit: 43.6 % (ref 34.0–46.6)
Hemoglobin: 13.8 g/dL (ref 11.1–15.9)
MCH: 27.2 pg (ref 26.6–33.0)
MCHC: 31.7 g/dL (ref 31.5–35.7)
MCV: 86 fL (ref 79–97)
Platelets: 227 x10E3/uL (ref 150–450)
RBC: 5.07 x10E6/uL (ref 3.77–5.28)
RDW: 12 % (ref 11.7–15.4)
WBC: 5.1 x10E3/uL (ref 3.4–10.8)

## 2023-12-25 LAB — BASIC METABOLIC PANEL WITH GFR
BUN/Creatinine Ratio: 9 — ABNORMAL LOW (ref 12–28)
BUN: 12 mg/dL (ref 8–27)
CO2: 20 mmol/L (ref 20–29)
Calcium: 9.9 mg/dL (ref 8.7–10.3)
Chloride: 95 mmol/L — ABNORMAL LOW (ref 96–106)
Creatinine, Ser: 1.41 mg/dL — ABNORMAL HIGH (ref 0.57–1.00)
Glucose: 107 mg/dL — ABNORMAL HIGH (ref 70–99)
Potassium: 3.3 mmol/L — ABNORMAL LOW (ref 3.5–5.2)
Sodium: 135 mmol/L (ref 134–144)
eGFR: 41 mL/min/1.73 — ABNORMAL LOW (ref >59)

## 2023-12-27 ENCOUNTER — Ambulatory Visit: Payer: Self-pay | Admitting: Family Medicine

## 2023-12-27 ENCOUNTER — Other Ambulatory Visit: Payer: Self-pay

## 2023-12-27 ENCOUNTER — Other Ambulatory Visit (HOSPITAL_COMMUNITY): Payer: Self-pay

## 2023-12-27 DIAGNOSIS — I10 Essential (primary) hypertension: Secondary | ICD-10-CM

## 2023-12-27 NOTE — Telephone Encounter (Signed)
 Called with results. Potassium low. Has had GI symptoms with Wegovy  (which was stopped). She is improving. Repeat on Thursday.   Please obtain BP and then send to lab for BMP on Thursday Orders in  Suzann Daring, MD  [facility] Medicine Teaching Service

## 2023-12-30 ENCOUNTER — Other Ambulatory Visit

## 2023-12-30 ENCOUNTER — Ambulatory Visit (INDEPENDENT_AMBULATORY_CARE_PROVIDER_SITE_OTHER)

## 2023-12-30 VITALS — BP 128/82 | HR 79

## 2023-12-30 DIAGNOSIS — I10 Essential (primary) hypertension: Secondary | ICD-10-CM | POA: Diagnosis not present

## 2023-12-30 DIAGNOSIS — Z0130 Encounter for examination of blood pressure without abnormal findings: Secondary | ICD-10-CM

## 2023-12-30 MED ORDER — METOCLOPRAMIDE HCL 5 MG PO TABS: 5.0000 mg | ORAL_TABLET | Freq: Two times a day (BID) | ORAL | 0 refills | Status: AC

## 2023-12-30 MED ORDER — CYCLOBENZAPRINE HCL 10 MG PO TABS: 10.0000 mg | ORAL_TABLET | Freq: Three times a day (TID) | ORAL | 3 refills | Status: AC | PRN

## 2023-12-30 NOTE — Progress Notes (Unsigned)
 Patient seen during nurse visit. Appetite slowly improving. Labs today. Reglan BID before meals. Still strongly suspect related Wegovy . If not improving next week, will obtain imaging.  Suzann Daring, MD  Family Medicine Teaching Service

## 2023-12-31 ENCOUNTER — Telehealth: Payer: Self-pay | Admitting: Family Medicine

## 2023-12-31 ENCOUNTER — Ambulatory Visit: Payer: Self-pay | Admitting: Family Medicine

## 2023-12-31 LAB — BASIC METABOLIC PANEL WITH GFR
BUN/Creatinine Ratio: 10 — ABNORMAL LOW (ref 12–28)
BUN: 12 mg/dL (ref 8–27)
CO2: 21 mmol/L (ref 20–29)
Calcium: 9.3 mg/dL (ref 8.7–10.3)
Chloride: 100 mmol/L (ref 96–106)
Creatinine, Ser: 1.22 mg/dL — ABNORMAL HIGH (ref 0.57–1.00)
Glucose: 89 mg/dL (ref 70–99)
Potassium: 3.5 mmol/L (ref 3.5–5.2)
Sodium: 139 mmol/L (ref 134–144)
eGFR: 49 mL/min/1.73 — ABNORMAL LOW (ref >59)

## 2023-12-31 NOTE — Telephone Encounter (Signed)
 Attempted to call patient. Reached voicemail, left generic voicemail to call back.  If she calls back Her potassium is normal Please ask how she is feeling with the Reglan  Tammy Daring, MD  [facility] Medicine Teaching Service

## 2023-12-31 NOTE — Progress Notes (Signed)
 Patient presents to nurse clinic for BP check.   Last BP on 10/24 was 151/89.  Today's BP was 128/82 in left arm.   Patient has been taking BP medications as prescribed.   Dr. Delores saw patient at the end of nursing visit. See MD note.   Tammy JAYSON English, RN

## 2023-12-31 NOTE — Telephone Encounter (Signed)
 Patient calls nurse line returning PCP call.   Advised of lab results.   She reports she just picked up the Reglan this morning.   She reports she will call on Monday with update.   FYI to PCP.

## 2024-01-03 NOTE — Telephone Encounter (Signed)
 Called patient. She feels improved. Suzann Daring, MD  Family Medicine Teaching Service

## 2024-01-04 ENCOUNTER — Other Ambulatory Visit: Payer: Self-pay | Admitting: *Deleted

## 2024-01-04 MED ORDER — FUROSEMIDE 20 MG PO TABS: ORAL_TABLET | ORAL | 3 refills | Status: AC

## 2024-01-18 NOTE — Assessment & Plan Note (Signed)
 Recent BMP appropriate

## 2024-01-18 NOTE — Progress Notes (Signed)
    SUBJECTIVE:   CHIEF COMPLAINT: check weight HPI:   Tammy [name] is a 68 y.o.  with history notable for obesity previously on Wegovy  with GI intolerance (suspect mild pancreatitis?), HTN, CKD  presenting for follow up.   Discussed the use of AI scribe software for clinical note transcription with the patient, who gave verbal consent to proceed.  History of Present Illness Tammy [name] is a 68 year old female who presents for follow-up after improvement in gastrointestinal symptoms.  Gastrointestinal symptoms - Significant improvement in gastrointestinal symptoms - No stomach pain or nausea - Constipation resolved; no longer requires stool softeners - Previously experienced significant weight loss but does not perceive changes in clothing fit - Current diet includes scrambled egg and applesauce in the morning, with more substantial meals later in the day such as shrimp fried rice and meatloaf with cabbage and yams -Not using reglan     Nicotine  dependence - Smokes approximately 10-12 cigarettes per day, increased from previous reduction to 6 cigarettes daily - Desires to use nicotine  patches but unable to afford them due to lack of insurance coverage Psychosocial stressors - Involved in caring for her granddaughter - Expresses some stress related to family dynamics and responsibilities  General review of systems - No current pain, breathing problems, or other acute symptoms   She reports her grandchild is great. She is babysitting Saturday night!   PERTINENT  PMH / PSH/Family/Social History : CKD, PAD   OBJECTIVE:   BP 125/65   Pulse 63   Ht 5' 6 (1.676 m)   Wt 251 lb (113.9 kg)   SpO2 98%   BMI 40.51 kg/m   Today's weight:  Last Weight  Most recent update: 01/21/2024 10:02 AM    Weight  113.9 kg (251 lb)            Review of prior weights: Filed Weights   01/21/24 1001  Weight: 251 lb (113.9 kg)     Cardiac: Regular rate and rhythm. Normal  S1/S2. No murmurs, rubs, or gallops appreciated. Lungs: Clear bilaterally to ascultation.  Abdomen: Normoactive bowel sounds. No tenderness to deep or light palpation. No rebound or guarding.   Psych: Pleasant and appropriate    ASSESSMENT/PLAN:   Assessment & Plan Essential hypertension At goal  Continue amlodipine  10 mg, furosemide  3 times a week hydralazine  50 mg twice daily metoprolol  tartrate 100 mg twice daily Stage 3b chronic kidney disease (HCC) Recent BMP appropriate  Morbid obesity (HCC) No longer on GLP1 Doing well Weight stable  No more abdominal pain, nausea since stopping  Coronary artery disease involving native coronary artery of native heart without angina pectoris Will call to schedule with Dr. Okey PULLER DEPENDENCE Discussed cessation Rx patches today   Due for BMP, CKD labs in January Also needs A1C at that visit   Suzann Daring, MD  Family Medicine Teaching Service  [facility]

## 2024-01-18 NOTE — Assessment & Plan Note (Signed)
 At goal  Continue amlodipine  10 mg, furosemide  3 times a week hydralazine  50 mg twice daily metoprolol  tartrate 100 mg twice daily

## 2024-01-21 ENCOUNTER — Encounter: Payer: Self-pay | Admitting: Family Medicine

## 2024-01-21 ENCOUNTER — Ambulatory Visit (INDEPENDENT_AMBULATORY_CARE_PROVIDER_SITE_OTHER): Admitting: Family Medicine

## 2024-01-21 VITALS — BP 125/65 | HR 63 | Ht 66.0 in | Wt 251.0 lb

## 2024-01-21 DIAGNOSIS — I2510 Atherosclerotic heart disease of native coronary artery without angina pectoris: Secondary | ICD-10-CM

## 2024-01-21 DIAGNOSIS — I10 Essential (primary) hypertension: Secondary | ICD-10-CM

## 2024-01-21 DIAGNOSIS — N1832 Chronic kidney disease, stage 3b: Secondary | ICD-10-CM | POA: Diagnosis not present

## 2024-01-21 DIAGNOSIS — F17200 Nicotine dependence, unspecified, uncomplicated: Secondary | ICD-10-CM

## 2024-01-21 NOTE — Patient Instructions (Addendum)
 It was wonderful to see you today.  Please bring ALL of your medications with you to every visit.   Today we talked about:   We will fax a prescription for nicotine  patches to the pharmacy  I highly recommend quitting!  You are due for blood work in January  I hope you have a WONDERFUL christmas!    Please follow up in 2 months Please see me in January   Thank you for choosing [facility].   Please call [phone] with any questions about today's appointment.  Please be sure to schedule follow up at the front  desk before you leave today.   Suzann Daring, MD  Family Medicine

## 2024-01-21 NOTE — Assessment & Plan Note (Addendum)
 No longer on GLP1 Doing well Weight stable  No more abdominal pain, nausea since stopping

## 2024-01-21 NOTE — Assessment & Plan Note (Addendum)
 Discussed cessation Rx patches today

## 2024-01-21 NOTE — Assessment & Plan Note (Addendum)
 Will call to schedule with Dr. Okey

## 2024-02-14 ENCOUNTER — Telehealth: Payer: Self-pay

## 2024-02-14 MED ORDER — METOPROLOL TARTRATE 100 MG PO TABS: 100.0000 mg | ORAL_TABLET | Freq: Two times a day (BID) | ORAL | 3 refills | Status: AC

## 2024-02-14 NOTE — Telephone Encounter (Signed)
 Patient calls nurse line regarding metoprolol  refill.   Per chart review refills were sent on 12/24/23. Called pharmacy. They advised that they never received rx.   Resent electronically.   Called patient and provided with update.   Chiquita JAYSON English, RN

## 2024-03-27 ENCOUNTER — Ambulatory Visit: Admitting: Family Medicine

## 2024-04-24 ENCOUNTER — Ambulatory Visit: Admitting: Family Medicine

## 2024-06-06 ENCOUNTER — Other Ambulatory Visit (HOSPITAL_BASED_OUTPATIENT_CLINIC_OR_DEPARTMENT_OTHER)

## 2024-06-29 ENCOUNTER — Encounter
